# Patient Record
Sex: Male | Born: 1945 | Race: White | Hispanic: No | Smoking: Former smoker
Health system: Southern US, Community
[De-identification: ages and names within clinical notes are randomized; demographics above are authoritative.]

## PROBLEM LIST (undated history)

## (undated) DIAGNOSIS — I10 Essential (primary) hypertension: Secondary | ICD-10-CM

## (undated) DIAGNOSIS — E785 Hyperlipidemia, unspecified: Secondary | ICD-10-CM

## (undated) HISTORY — PX: EYE SURGERY: SHX253

## (undated) HISTORY — DX: Hyperlipidemia, unspecified: E78.5

## (undated) HISTORY — PX: TONSILECTOMY/ADENOIDECTOMY WITH MYRINGOTOMY: SHX6125

## (undated) HISTORY — DX: Essential (primary) hypertension: I10

---

## 2011-06-15 ENCOUNTER — Other Ambulatory Visit (HOSPITAL_COMMUNITY): Payer: Self-pay | Admitting: *Deleted

## 2011-06-15 DIAGNOSIS — E041 Nontoxic single thyroid nodule: Secondary | ICD-10-CM

## 2011-06-16 ENCOUNTER — Other Ambulatory Visit (HOSPITAL_BASED_OUTPATIENT_CLINIC_OR_DEPARTMENT_OTHER): Payer: Self-pay | Admitting: Internal Medicine

## 2011-06-16 ENCOUNTER — Other Ambulatory Visit: Payer: Self-pay | Admitting: Family Medicine

## 2011-06-18 ENCOUNTER — Ambulatory Visit (HOSPITAL_COMMUNITY)
Admission: RE | Admit: 2011-06-18 | Discharge: 2011-06-18 | Disposition: A | Discharge status: Home / Self Care | Payer: BC Managed Care – PPO | Source: Ambulatory Visit | Attending: Family Medicine | Admitting: Family Medicine

## 2011-06-18 DIAGNOSIS — E041 Nontoxic single thyroid nodule: Secondary | ICD-10-CM

## 2011-06-18 DIAGNOSIS — E049 Nontoxic goiter, unspecified: Secondary | ICD-10-CM | POA: Insufficient documentation

## 2012-06-17 ENCOUNTER — Encounter: Payer: Self-pay | Admitting: Nurse Practitioner

## 2012-06-17 ENCOUNTER — Ambulatory Visit (INDEPENDENT_AMBULATORY_CARE_PROVIDER_SITE_OTHER): Payer: Medicare Other | Admitting: Nurse Practitioner

## 2012-06-17 VITALS — BP 121/76 | HR 75 | Temp 99.1°F | Ht 67.0 in | Wt 186.0 lb

## 2012-06-17 DIAGNOSIS — E785 Hyperlipidemia, unspecified: Secondary | ICD-10-CM

## 2012-06-17 DIAGNOSIS — I10 Essential (primary) hypertension: Secondary | ICD-10-CM | POA: Insufficient documentation

## 2012-06-17 DIAGNOSIS — E782 Mixed hyperlipidemia: Secondary | ICD-10-CM | POA: Insufficient documentation

## 2012-06-17 DIAGNOSIS — Z139 Encounter for screening, unspecified: Secondary | ICD-10-CM

## 2012-06-17 MED ORDER — LISINOPRIL 10 MG PO TABS: 10.0000 mg | ORAL_TABLET | Freq: Every day | ORAL | Status: DC

## 2012-06-17 NOTE — Progress Notes (Signed)
  Subjective:    Patient ID: Grant Berry, male    DOB: [birth year], 67 y.o.   MRN: [id number]  Hypertension This is a chronic problem. The current episode started more than 1 year ago. The problem is unchanged. The problem is controlled. Pertinent negatives include no chest pain, headaches, malaise/fatigue, neck pain, orthopnea, palpitations, peripheral edema, shortness of breath or sweats. Risk factors for coronary artery disease include dyslipidemia, obesity and male gender. Past treatments include ACE inhibitors. The current treatment provides moderate improvement. There are no compliance problems.   Hyperlipidemia This is a chronic problem. The current episode started more than 1 year ago. The problem is controlled. Recent lipid tests were reviewed and are normal. There are no known factors aggravating his hyperlipidemia. Pertinent negatives include no chest pain, focal sensory loss, focal weakness, leg pain, myalgias or shortness of breath. Current antihyperlipidemic treatment includes diet change and exercise (flax seed oil OTC 3 X/day). The current treatment provides significant improvement of lipids. There are no compliance problems.  Risk factors for coronary artery disease include hypertension and male sex.      Review of Systems  Constitutional: Negative.  Negative for malaise/fatigue.  HENT: Negative.  Negative for neck pain.   Eyes: Negative.   Respiratory: Negative for shortness of breath.   Cardiovascular: Negative for chest pain, palpitations and orthopnea.  Endocrine: Negative.   Genitourinary: Negative.   Musculoskeletal: Negative.  Negative for myalgias.  Neurological: Negative for focal weakness and headaches.  Hematological: Negative.   Psychiatric/Behavioral: Negative.        Objective:   Physical Exam  Constitutional: He is oriented to person, place, and time. He appears well-developed and well-nourished.  HENT:  Head: Normocephalic.  Right Ear: External ear  normal.  Left Ear: External ear normal.  Nose: Nose normal.  Mouth/Throat: Oropharynx is clear and moist.  Eyes: EOM are normal. Pupils are equal, round, and reactive to light.  Neck: Normal range of motion. Neck supple. No thyromegaly present.  Cardiovascular: Normal rate, regular rhythm, normal heart sounds and intact distal pulses.   No murmur heard. Pulmonary/Chest: Effort normal and breath sounds normal. He has no wheezes. He has no rales.  Abdominal: Soft. Bowel sounds are normal.  Genitourinary:  Refuses rectal exam  Musculoskeletal: Normal range of motion.  Neurological: He is alert and oriented to person, place, and time.  Skin: Skin is warm and dry.  Psychiatric: He has a normal mood and affect. His behavior is normal. Judgment and thought content normal.   BP 121/76  Pulse 75  Temp(Src) 99.1 F (37.3 C) (Oral)  Ht 5\' 7"  (1.702 m)  Wt 186 lb (84.369 kg)  BMI 29.12 kg/m2     Assessment & Plan:  1. Essential hypertension, benign Low Na+ diet - COMPLETE METABOLIC PANEL WITH GFR - lisinopril (PRINIVIL,ZESTRIL) 10 MG tablet; Take 1 tablet (10 mg total) by mouth daily.  Dispense: 90 tablet; Refill: 1  2. Hyperlipidemia with target LDL less than 100 Low fat diet and exercise - NMR Lipoprofile with Lipids  3. Screening - PSA Mary-Margaret Daphine Deutscher, FNP

## 2012-06-17 NOTE — Patient Instructions (Signed)

## 2012-06-18 LAB — COMPLETE METABOLIC PANEL WITH GFR
AST: 22 U/L (ref 0–37)
Albumin: 4 g/dL (ref 3.5–5.2)
Alkaline Phosphatase: 47 U/L (ref 39–117)
Potassium: 4.7 mEq/L (ref 3.5–5.3)
Sodium: 137 mEq/L (ref 135–145)
Total Protein: 7 g/dL (ref 6.0–8.3)

## 2012-06-18 LAB — PSA: PSA: 3.23 ng/mL (ref <=4.00)

## 2012-06-20 ENCOUNTER — Other Ambulatory Visit: Payer: Self-pay | Admitting: Nurse Practitioner

## 2012-06-20 LAB — NMR LIPOPROFILE WITH LIPIDS
Cholesterol, Total: 219 mg/dL — ABNORMAL HIGH (ref <200)
HDL-C: 34 mg/dL — ABNORMAL LOW (ref >=40)
LDL Particle Number: 2224 nmol/L — ABNORMAL HIGH (ref <1000)
Large HDL-P: 1.8 umol/L — ABNORMAL LOW (ref >=4.8)
Large VLDL-P: 3.3 nmol/L — ABNORMAL HIGH (ref <=2.7)
Triglycerides: 215 mg/dL — ABNORMAL HIGH (ref <150)
VLDL Size: 44.9 nm (ref <=46.6)

## 2012-06-20 MED ORDER — ATORVASTATIN CALCIUM 40 MG PO TABS: 40.0000 mg | ORAL_TABLET | Freq: Every day | ORAL | Status: DC

## 2012-06-24 ENCOUNTER — Other Ambulatory Visit: Payer: Self-pay | Admitting: Nurse Practitioner

## 2012-06-24 DIAGNOSIS — I10 Essential (primary) hypertension: Secondary | ICD-10-CM

## 2012-06-24 MED ORDER — ATORVASTATIN CALCIUM 40 MG PO TABS: 40.0000 mg | ORAL_TABLET | Freq: Every day | ORAL | Status: DC

## 2012-06-24 MED ORDER — LISINOPRIL 10 MG PO TABS: 10.0000 mg | ORAL_TABLET | Freq: Every day | ORAL | Status: DC

## 2012-12-26 ENCOUNTER — Encounter: Payer: Self-pay | Admitting: Nurse Practitioner

## 2012-12-26 ENCOUNTER — Ambulatory Visit (INDEPENDENT_AMBULATORY_CARE_PROVIDER_SITE_OTHER): Payer: Medicare Other | Admitting: Nurse Practitioner

## 2012-12-26 VITALS — BP 154/79 | HR 77 | Temp 99.5°F | Ht 67.0 in | Wt 179.0 lb

## 2012-12-26 DIAGNOSIS — Z125 Encounter for screening for malignant neoplasm of prostate: Secondary | ICD-10-CM

## 2012-12-26 DIAGNOSIS — E785 Hyperlipidemia, unspecified: Secondary | ICD-10-CM

## 2012-12-26 DIAGNOSIS — Z23 Encounter for immunization: Secondary | ICD-10-CM

## 2012-12-26 DIAGNOSIS — I10 Essential (primary) hypertension: Secondary | ICD-10-CM

## 2012-12-26 MED ORDER — ATORVASTATIN CALCIUM 40 MG PO TABS: 40.0000 mg | ORAL_TABLET | Freq: Every day | ORAL | Status: DC

## 2012-12-26 MED ORDER — LISINOPRIL 10 MG PO TABS: 10.0000 mg | ORAL_TABLET | Freq: Every day | ORAL | Status: DC

## 2012-12-26 NOTE — Progress Notes (Signed)
Subjective:    Patient ID: Grant Berry, male    DOB: 04-22-1945, 67 y.o.   MRN: [id number]  Hypertension This is a chronic problem. The current episode started more than 1 year ago. The problem is unchanged. The problem is controlled. Pertinent negatives include no chest pain, headaches, malaise/fatigue, neck pain, orthopnea, palpitations, peripheral edema, shortness of breath or sweats. Risk factors for coronary artery disease include dyslipidemia, obesity and male gender. Past treatments include ACE inhibitors. The current treatment provides moderate improvement. There are no compliance problems.   Hyperlipidemia This is a chronic problem. The current episode started more than 1 year ago. The problem is controlled. Recent lipid tests were reviewed and are normal. There are no known factors aggravating his hyperlipidemia. Pertinent negatives include no chest pain, focal sensory loss, focal weakness, leg pain, myalgias or shortness of breath. Current antihyperlipidemic treatment includes diet change and exercise (flax seed oil OTC 3 X/day). The current treatment provides significant improvement of lipids. There are no compliance problems.  Risk factors for coronary artery disease include hypertension and male sex.      Review of Systems  Constitutional: Negative.  Negative for malaise/fatigue.  HENT: Negative.   Eyes: Negative.   Respiratory: Negative for shortness of breath.   Cardiovascular: Negative for chest pain, palpitations and orthopnea.  Endocrine: Negative.   Genitourinary: Negative.   Musculoskeletal: Negative.  Negative for myalgias and neck pain.  Neurological: Negative for focal weakness and headaches.  Hematological: Negative.   Psychiatric/Behavioral: Negative.        Objective:   Physical Exam  Constitutional: He is oriented to person, place, and time. He appears well-developed and well-nourished.  HENT:  Head: Normocephalic.  Right Ear: External ear normal.   Left Ear: External ear normal.  Nose: Nose normal.  Mouth/Throat: Oropharynx is clear and moist.  Eyes: EOM are normal. Pupils are equal, round, and reactive to light.  Neck: Normal range of motion. Neck supple. No thyromegaly present.  Cardiovascular: Normal rate, regular rhythm, normal heart sounds and intact distal pulses.   No murmur heard. Pulmonary/Chest: Effort normal and breath sounds normal. He has no wheezes. He has no rales.  Abdominal: Soft. Bowel sounds are normal.  Genitourinary:  Refuses rectal exam  Musculoskeletal: Normal range of motion.  Neurological: He is alert and oriented to person, place, and time.  Skin: Skin is warm and dry.  Psychiatric: He has a normal mood and affect. His behavior is normal. Judgment and thought content normal.   BP 154/79  Pulse 77  Temp(Src) 99.5 F (37.5 C) (Oral)  Ht 5\' 7"  (1.702 m)  Wt 179 lb (81.194 kg)  BMI 28.03 kg/m2     Assessment & Plan:   1. Essential hypertension, benign   2. Hyperlipidemia with target LDL less than 100   3. Prostate cancer screening    Orders Placed This Encounter  Procedures  . CMP14+EGFR  . NMR, lipoprofile  . PSA, total and free   Meds ordered this encounter  Medications  . Cholecalciferol (VITAMIN D-3) 1000 UNITS CAPS    Sig: Take by mouth.  Marland Kitchen atorvastatin (LIPITOR) 40 MG tablet    Sig: Take 1 tablet (40 mg total) by mouth daily.    Dispense:  90 tablet    Refill:  1    Order Specific Question:  Supervising Provider    Answer:  Ernestina Penna [[id number]]  . lisinopril (PRINIVIL,ZESTRIL) 10 MG tablet    Sig: Take 1 tablet (10 mg total)  by mouth daily.    Dispense:  90 tablet    Refill:  1    Order Specific Question:  Supervising Provider    Answer:  Deborra Medina    Continue all meds Labs pending Diet and exercise encouraged Health maintenance reviewed Follow up in 3 month  Mary-Margaret Daphine Deutscher, FNP

## 2012-12-26 NOTE — Patient Instructions (Signed)

## 2013-02-20 ENCOUNTER — Telehealth: Payer: Self-pay | Admitting: Nurse Practitioner

## 2013-02-20 NOTE — Telephone Encounter (Signed)
Patient advised to go to urgent care do to no available appts

## 2013-12-25 ENCOUNTER — Other Ambulatory Visit: Payer: Self-pay | Admitting: Nurse Practitioner

## 2014-03-11 DIAGNOSIS — J069 Acute upper respiratory infection, unspecified: Secondary | ICD-10-CM | POA: Diagnosis not present

## 2014-07-14 DIAGNOSIS — H40033 Anatomical narrow angle, bilateral: Secondary | ICD-10-CM | POA: Diagnosis not present

## 2014-07-14 DIAGNOSIS — H2513 Age-related nuclear cataract, bilateral: Secondary | ICD-10-CM | POA: Diagnosis not present

## 2014-09-03 DIAGNOSIS — H2511 Age-related nuclear cataract, right eye: Secondary | ICD-10-CM | POA: Diagnosis not present

## 2014-10-01 DIAGNOSIS — H2513 Age-related nuclear cataract, bilateral: Secondary | ICD-10-CM | POA: Diagnosis not present

## 2014-10-01 DIAGNOSIS — Z961 Presence of intraocular lens: Secondary | ICD-10-CM | POA: Diagnosis not present

## 2014-10-01 DIAGNOSIS — H2511 Age-related nuclear cataract, right eye: Secondary | ICD-10-CM | POA: Diagnosis not present

## 2014-11-05 DIAGNOSIS — H52202 Unspecified astigmatism, left eye: Secondary | ICD-10-CM | POA: Diagnosis not present

## 2014-11-05 DIAGNOSIS — Z961 Presence of intraocular lens: Secondary | ICD-10-CM | POA: Diagnosis not present

## 2014-11-05 DIAGNOSIS — H25812 Combined forms of age-related cataract, left eye: Secondary | ICD-10-CM | POA: Diagnosis not present

## 2014-11-05 DIAGNOSIS — H2512 Age-related nuclear cataract, left eye: Secondary | ICD-10-CM | POA: Diagnosis not present

## 2015-01-29 ENCOUNTER — Telehealth: Payer: Self-pay | Admitting: Nurse Practitioner

## 2019-07-25 DIAGNOSIS — H40033 Anatomical narrow angle, bilateral: Secondary | ICD-10-CM | POA: Diagnosis not present

## 2019-07-25 DIAGNOSIS — H10013 Acute follicular conjunctivitis, bilateral: Secondary | ICD-10-CM | POA: Diagnosis not present

## 2019-09-01 ENCOUNTER — Emergency Department (HOSPITAL_COMMUNITY): Payer: Medicare Other

## 2019-09-01 ENCOUNTER — Inpatient Hospital Stay (HOSPITAL_COMMUNITY)
Admission: EM | Admit: 2019-09-01 | Discharge: 2019-09-03 | DRG: 066 | Disposition: A | Discharge status: Home / Self Care | Payer: Medicare Other | Attending: Internal Medicine | Admitting: Internal Medicine

## 2019-09-01 ENCOUNTER — Encounter (HOSPITAL_COMMUNITY): Payer: Self-pay

## 2019-09-01 DIAGNOSIS — Z8249 Family history of ischemic heart disease and other diseases of the circulatory system: Secondary | ICD-10-CM | POA: Diagnosis not present

## 2019-09-01 DIAGNOSIS — R9431 Abnormal electrocardiogram [ECG] [EKG]: Secondary | ICD-10-CM | POA: Diagnosis not present

## 2019-09-01 DIAGNOSIS — Z823 Family history of stroke: Secondary | ICD-10-CM

## 2019-09-01 DIAGNOSIS — R000 Tachycardia, unspecified: Secondary | ICD-10-CM | POA: Diagnosis not present

## 2019-09-01 DIAGNOSIS — Z20822 Contact with and (suspected) exposure to covid-19: Secondary | ICD-10-CM | POA: Diagnosis present

## 2019-09-01 DIAGNOSIS — R29700 NIHSS score 0: Secondary | ICD-10-CM | POA: Diagnosis present

## 2019-09-01 DIAGNOSIS — G8324 Monoplegia of upper limb affecting left nondominant side: Secondary | ICD-10-CM | POA: Diagnosis present

## 2019-09-01 DIAGNOSIS — R404 Transient alteration of awareness: Secondary | ICD-10-CM | POA: Diagnosis not present

## 2019-09-01 DIAGNOSIS — E782 Mixed hyperlipidemia: Secondary | ICD-10-CM | POA: Diagnosis present

## 2019-09-01 DIAGNOSIS — I361 Nonrheumatic tricuspid (valve) insufficiency: Secondary | ICD-10-CM | POA: Diagnosis not present

## 2019-09-01 DIAGNOSIS — I7090 Unspecified atherosclerosis: Secondary | ICD-10-CM | POA: Diagnosis not present

## 2019-09-01 DIAGNOSIS — Z833 Family history of diabetes mellitus: Secondary | ICD-10-CM | POA: Diagnosis not present

## 2019-09-01 DIAGNOSIS — K0889 Other specified disorders of teeth and supporting structures: Secondary | ICD-10-CM | POA: Diagnosis not present

## 2019-09-01 DIAGNOSIS — Z87891 Personal history of nicotine dependence: Secondary | ICD-10-CM | POA: Diagnosis not present

## 2019-09-01 DIAGNOSIS — I10 Essential (primary) hypertension: Secondary | ICD-10-CM | POA: Diagnosis not present

## 2019-09-01 DIAGNOSIS — E785 Hyperlipidemia, unspecified: Secondary | ICD-10-CM | POA: Diagnosis not present

## 2019-09-01 DIAGNOSIS — I6523 Occlusion and stenosis of bilateral carotid arteries: Secondary | ICD-10-CM | POA: Diagnosis not present

## 2019-09-01 DIAGNOSIS — R42 Dizziness and giddiness: Secondary | ICD-10-CM | POA: Diagnosis not present

## 2019-09-01 DIAGNOSIS — G319 Degenerative disease of nervous system, unspecified: Secondary | ICD-10-CM | POA: Diagnosis not present

## 2019-09-01 DIAGNOSIS — R531 Weakness: Secondary | ICD-10-CM | POA: Diagnosis not present

## 2019-09-01 DIAGNOSIS — I639 Cerebral infarction, unspecified: Principal | ICD-10-CM | POA: Diagnosis present

## 2019-09-01 DIAGNOSIS — R7989 Other specified abnormal findings of blood chemistry: Secondary | ICD-10-CM | POA: Diagnosis present

## 2019-09-01 DIAGNOSIS — Z743 Need for continuous supervision: Secondary | ICD-10-CM | POA: Diagnosis not present

## 2019-09-01 DIAGNOSIS — R0689 Other abnormalities of breathing: Secondary | ICD-10-CM | POA: Diagnosis not present

## 2019-09-01 DIAGNOSIS — I499 Cardiac arrhythmia, unspecified: Secondary | ICD-10-CM | POA: Diagnosis not present

## 2019-09-01 DIAGNOSIS — I6782 Cerebral ischemia: Secondary | ICD-10-CM | POA: Diagnosis not present

## 2019-09-01 LAB — TROPONIN I (HIGH SENSITIVITY)
Troponin I (High Sensitivity): 6 ng/L (ref <18)
Troponin I (High Sensitivity): 6 ng/L (ref <18)

## 2019-09-01 LAB — BASIC METABOLIC PANEL
Anion gap: 9 (ref 5–15)
BUN: 18 mg/dL (ref 8–23)
CO2: 26 mmol/L (ref 22–32)
Calcium: 9.4 mg/dL (ref 8.9–10.3)
Chloride: 103 mmol/L (ref 98–111)
Creatinine, Ser: 1.31 mg/dL — ABNORMAL HIGH (ref 0.61–1.24)
GFR calc Af Amer: >60 mL/min (ref >60)
GFR calc non Af Amer: 54 mL/min — ABNORMAL LOW (ref >60)
Glucose, Bld: 97 mg/dL (ref 70–99)
Potassium: 4 mmol/L (ref 3.5–5.1)
Sodium: 138 mmol/L (ref 135–145)

## 2019-09-01 LAB — CBC
HCT: 49.7 % (ref 39.0–52.0)
Hemoglobin: 16.4 g/dL (ref 13.0–17.0)
MCH: 30 pg (ref 26.0–34.0)
MCHC: 33 g/dL (ref 30.0–36.0)
MCV: 91 fL (ref 80.0–100.0)
Platelets: 206 10*3/uL (ref 150–400)
RBC: 5.46 MIL/uL (ref 4.22–5.81)
RDW: 13.6 % (ref 11.5–15.5)
WBC: 7.3 10*3/uL (ref 4.0–10.5)
nRBC: 0 % (ref 0.0–0.2)

## 2019-09-01 LAB — MAGNESIUM: Magnesium: 2.2 mg/dL (ref 1.7–2.4)

## 2019-09-01 LAB — SARS CORONAVIRUS 2 BY RT PCR (HOSPITAL ORDER, PERFORMED IN ~~LOC~~ HOSPITAL LAB): SARS Coronavirus 2: NEGATIVE

## 2019-09-01 MED ORDER — MECLIZINE HCL 12.5 MG PO TABS
50.0000 mg | ORAL_TABLET | Freq: Once | ORAL | Status: AC
  Administered 2019-09-01: 50 mg via ORAL
  Filled 2019-09-01: qty 4

## 2019-09-01 MED ORDER — SODIUM CHLORIDE 0.9 % IV SOLN: INTRAVENOUS | Status: DC

## 2019-09-01 MED ORDER — ACETAMINOPHEN 650 MG RE SUPP: 650.0000 mg | Freq: Four times a day (QID) | RECTAL | Status: DC | PRN

## 2019-09-01 MED ORDER — ASPIRIN 300 MG RE SUPP: 300.0000 mg | Freq: Every day | RECTAL | Status: DC

## 2019-09-01 MED ORDER — ACETAMINOPHEN 325 MG PO TABS: 650.0000 mg | ORAL_TABLET | Freq: Four times a day (QID) | ORAL | Status: DC | PRN

## 2019-09-01 MED ORDER — ONDANSETRON HCL 4 MG/2ML IJ SOLN: 4.0000 mg | Freq: Four times a day (QID) | INTRAMUSCULAR | Status: DC | PRN

## 2019-09-01 MED ORDER — ASPIRIN 325 MG PO TABS
325.0000 mg | ORAL_TABLET | Freq: Every day | ORAL | Status: DC
  Administered 2019-09-01 – 2019-09-03 (×3): 325 mg via ORAL
  Filled 2019-09-01 (×3): qty 1

## 2019-09-01 MED ORDER — STROKE: EARLY STAGES OF RECOVERY BOOK
Freq: Once | Status: AC
  Administered 2019-09-02: 1
  Filled 2019-09-01: qty 1

## 2019-09-01 MED ORDER — ONDANSETRON HCL 4 MG PO TABS: 4.0000 mg | ORAL_TABLET | Freq: Four times a day (QID) | ORAL | Status: DC | PRN

## 2019-09-01 NOTE — ED Triage Notes (Signed)
Yesterday around 2 pm, pt began experiencing weakness on the left side and dizziness. History of HTN. Sinus arrhythmia with EMS. When patient stood up today, he was very weak.

## 2019-09-01 NOTE — ED Provider Notes (Signed)
[facility]ERGENCY DEPT [facility] Emergency Department Provider Note MRN:  [id number]  Arrival date & time: 09/02/19     Chief Complaint   Dizziness   History of Present Illness   Grant Berry is a 74 y.o. year-old male with a history of hypertension, hyperlipidemia presenting to the ED with chief complaint of dizziness.  Patient explains that for the past 1 or 2 days he is experiencing a room spinning sensation when he looks to the left and when he looks down.  This is never happened to him before.  EMS told him that his left side seemed weak, and now he feels that he has been experiencing some left-sided weakness since yesterday.  The weakness he describes as a tremor, mostly in the left arm that seems to be occurring when he is trying to tie his shoes.  Denies headache, no vision change, no numbness, no chest pain or shortness of breath, no abdominal pain.  Review of Systems  A complete 10 system review of systems was obtained and all systems are negative except as noted in the HPI and PMH.   Patient's Health History    Past Medical History:  Diagnosis Date  . Hyperlipidemia   . Hypertension     Past Surgical History:  Procedure Laterality Date  . TONSILECTOMY/ADENOIDECTOMY WITH MYRINGOTOMY      Family History  Problem Relation Age of Onset  . Stroke Mother   . Coronary artery disease Mother   . Coronary artery disease Father   . Diabetes Maternal Grandfather     Social History   Socioeconomic History  . Marital status: Married    Spouse name: Not on file  . Number of children: Not on file  . Years of education: Not on file  . Highest education level: Not on file  Occupational History  . Not on file  Tobacco Use  . Smoking status: Former Smoker    Quit date: 05/24/1988    Years since quitting: 31.2  . Smokeless tobacco: Never Used  Substance and Sexual Activity  . Alcohol use: No  . Drug use: No  . Sexual activity: Not on file  Other Topics Concern  .  Not on file  Social History Narrative  . Not on file   Social Determinants of Health   Financial Resource Strain:   . Difficulty of Paying Living Expenses:   Food Insecurity:   . Worried About Programme researcher, broadcasting/film/video in the Last Year:   . Barista in the Last Year:   Transportation Needs:   . Freight forwarder (Medical):   Marland Kitchen Lack of Transportation (Non-Medical):   Physical Activity:   . Days of Exercise per Week:   . Minutes of Exercise per Session:   Stress:   . Feeling of Stress :   Social Connections:   . Frequency of Communication with Friends and Family:   . Frequency of Social Gatherings with Friends and Family:   . Attends Religious Services:   . Active Member of Clubs or Organizations:   . Attends Banker Meetings:   Marland Kitchen Marital Status:   Intimate Partner Violence:   . Fear of Current or Ex-Partner:   . Emotionally Abused:   Marland Kitchen Physically Abused:   . Sexually Abused:      Physical Exam   Vitals:   09/01/19 1603  BP: (!) 166/93  Pulse: 72  Resp: 15  Temp: 98.4 F (36.9 C)  SpO2: 100%  CONSTITUTIONAL: Well-appearing, NAD NEURO:  Alert and oriented x 3, normal and symmetric strength and sensation, normal coordination, normal speech, no neglect, no aphasia, no visual field cuts EYES:  eyes equal and reactive, normal extraocular movements, no nystagmus ENT/NECK:  no LAD, no JVD CARDIO: Regular rate, well-perfused, normal S1 and S2 PULM:  CTAB no wheezing or rhonchi GI/GU:  normal bowel sounds, non-distended, non-tender MSK/SPINE:  No gross deformities, no edema SKIN:  no rash, atraumatic PSYCH:  Appropriate speech and behavior  *Additional and/or pertinent findings included in MDM below  Diagnostic and Interventional Summary    EKG Interpretation  Date/Time:  Friday September 01 2019 15:56:22 EDT Ventricular Rate:  91 PR Interval:  190 QRS Duration: 92 QT Interval:  362 QTC Calculation: 445 R Axis:   73 Text Interpretation: Sinus  rhythm with Premature atrial complexes Nonspecific ST abnormality Abnormal ECG Confirmed by Kennis Carina [phone]) on 09/01/2019 6:34:40 PM      Labs Reviewed  BASIC METABOLIC PANEL - Abnormal; Notable for the following components:      Result Value   Creatinine, Ser 1.31 (*)    GFR calc non Af Amer 54 (*)    All other components within normal limits  SARS CORONAVIRUS 2 BY RT PCR (HOSPITAL ORDER, PERFORMED IN [facility])  CBC  MAGNESIUM  HEMOGLOBIN A1C  LIPID PANEL  BASIC METABOLIC PANEL  TROPONIN I (HIGH SENSITIVITY)  TROPONIN I (HIGH SENSITIVITY)    MR BRAIN WO CONTRAST  Final Result    MR ANGIO HEAD WO CONTRAST  Final Result    CT HEAD WO CONTRAST  Final Result      Medications   stroke: mapping our early stages of recovery book (has no administration in time range)  0.9 %  sodium chloride infusion ( Intravenous New Bag/Given 09/01/19 2249)  acetaminophen (TYLENOL) tablet 650 mg (has no administration in time range)    Or  acetaminophen (TYLENOL) suppository 650 mg (has no administration in time range)  ondansetron (ZOFRAN) tablet 4 mg (has no administration in time range)    Or  ondansetron (ZOFRAN) injection 4 mg (has no administration in time range)  aspirin suppository 300 mg ( Rectal See Alternative 09/01/19 2248)    Or  aspirin tablet 325 mg (325 mg Oral Given 09/01/19 2248)  meclizine (ANTIVERT) tablet 50 mg (50 mg Oral Given 09/01/19 1726)     Procedures  /  Critical Care .Critical Care Performed by: Sabas Sous, MD Authorized by: Sabas Sous, MD   Critical care provider statement:    Critical care time (minutes):  45   Critical care was necessary to treat or prevent imminent or life-threatening deterioration of the following conditions: acute ischemic stroke.   Critical care was time spent personally by me on the following activities:  Discussions with consultants, evaluation of patient's response to treatment, examination of patient,  ordering and performing treatments and interventions, ordering and review of laboratory studies, ordering and review of radiographic studies, pulse oximetry, re-evaluation of patient's condition, obtaining history from patient or surrogate and review of old charts    ED Course and Medical Decision Making  I have reviewed the triage vital signs, the nursing notes, and pertinent available records from the EMR.  Listed above are laboratory and imaging tests that I personally ordered, reviewed, and interpreted and then considered in my medical decision making (see below for details).      History is initially highly consistent with benign positional vertigo.  However patient is now endorsing this left-sided weakness but he describes it poorly, he has a completely normal neurological exam at this time.  Awaiting labs and CT and will reexamine to determine need for further stroke work-up.  Patient is not a candidate for code stroke initiation given the time of onset greater than 7 hours ago and current NIH stroke scale of 0.  CT head revealing possible cerebellar infarct, confirmed on MRI.  Upon further questioning, patient's description of a left arm tremor yesterday might have actually been loss of coordination, which can be related to the ipsilateral or supplemental.  Admitted to hospital service for further care.  Elmer Sow. Pilar Plate, MD [facility] Emergency Medicine [facility] [email or web address]  Final Clinical Impressions(s) / ED Diagnoses     ICD-10-CM   1. Acute ischemic stroke [facility])  I63.9     ED Discharge Orders    None       Discharge Instructions Discussed with and Provided to Patient:   Discharge Instructions   None       Sabas Sous, MD 09/02/19 [phone]

## 2019-09-01 NOTE — H&P (Addendum)
History and Physical    Grant Berry ZOX:[id number] DOB: 03/03/1945 DOA: 09/01/2019  PCP: Patient, No Pcp Per  Patient coming from: Home.  I have personally briefly reviewed patient's old medical records in [facility]  Chief Complaint: Dizziness and weakness.  HPI: Grant Berry is a 74 y.o. male with medical history significant of hypertension, hyperlipidemia who is coming to the emergency department due to having a spinning sensation since yesterday particularly when he looks to the left or down.  He denies nausea, emesis, vision changes, headaches, but state he has also been experiencing left sided extremities weakness since yesterday.  He denies fever, chills, rhinorrhea, sore throat, dyspnea, wheezing or hemoptysis.  No chest pain, palpitations, diaphoresis, PND, orthopnea or pitting edema of the lower extremities.  Denies abdominal pain, diarrhea, constipation, melena or hematochezia.  No dysuria, frequency or hematuria.  ED Course: Initial vital signs were temperature 98.4 F, pulse 72, RR 15 bpm, BP 166/93 mmHg and O2 sat 100% on room air.  25 mg of meclizine were given in the ED.  CBC was normal.  BMP showed a creatinine of 1.31, increased from 1.22 mg/dL, otherwise the BMP is normal.  Troponin x2 6 ng/L.  ECG showed nonspecific ST abnormality.  Imaging: CT head showed a 2 cm focus of abnormal hypodensity within the left cerebellar hemisphere.  This was confirmed by MRI MRA of brain.  Please see images and full radiology report for further detail.  Review of Systems: As per HPI otherwise all other systems reviewed and are negative.  Past Medical History:  Diagnosis Date  . Hyperlipidemia   . Hypertension    Past Surgical History:  Procedure Laterality Date  . TONSILECTOMY/ADENOIDECTOMY WITH MYRINGOTOMY     Social History  reports that he quit smoking about 31 years ago. He has never used smokeless tobacco. He reports that he does not drink alcohol and does not  use drugs.  No Known Allergies  Family History  Problem Relation Age of Onset  . Stroke Mother   . Coronary artery disease Mother   . Coronary artery disease Father   . Diabetes Maternal Grandfather    Prior to Admission medications   Not on File   Physical Exam: Vitals:   09/01/19 1603  BP: (!) 166/93  Pulse: 72  Resp: 15  Temp: 98.4 F (36.9 C)  TempSrc: Oral  SpO2: 100%  Weight: 81.2 kg  Height: 5\' 7"  (1.702 m)   Constitutional: NAD, calm, comfortable Eyes: PERRL, lids and conjunctivae are injected. ENMT: Mucous membranes are mildly dry.  Posterior pharynx clear of any exudate or lesions. Neck: normal, supple, no masses, no thyromegaly Respiratory: clear to auscultation bilaterally, no wheezing, no crackles. Normal respiratory effort. No accessory muscle use.  Cardiovascular: Regular rate and rhythm, no murmurs / rubs / gallops. No extremity edema. 2+ pedal pulses. No carotid bruits.  Abdomen: Nondistended.  BS positive.  Soft, no tenderness, no masses palpated. No hepatosplenomegaly. Musculoskeletal: no clubbing / cyanosis. Good ROM, no contractures. Normal muscle tone.  Skin: no rashes, lesions, ulcers on very limited dermatological examination. Neurologic: Intention tremor with decreased coordination on LUE.  CN 2-12 grossly intact. Sensation seems intact and equal bilaterally, DTR normal.  Left-sided 4.5/5 hemiparesis Psychiatric: Normal judgment and insight. Alert and oriented x 3. Normal mood.   Labs on Admission: I have personally reviewed following labs and imaging studies  CBC: Recent Labs  Lab 09/01/19 1706  WBC 7.3  HGB 16.4  HCT 49.7  MCV 91.0  PLT 206    Basic Metabolic Panel: Recent Labs  Lab 09/01/19 1706  NA 138  K 4.0  CL 103  CO2 26  GLUCOSE 97  BUN 18  CREATININE 1.31*  CALCIUM 9.4  MG 2.2    GFR: Estimated Creatinine Clearance: 51.2 mL/min (A) (by C-G formula based on SCr of 1.31 mg/dL (H)).  Liver Function Tests: No  results for input(s): AST, ALT, ALKPHOS, BILITOT, PROT, ALBUMIN in the last 168 hours.  Urine analysis: No results found for: COLORURINE, APPEARANCEUR, LABSPEC, PHURINE, GLUCOSEU, HGBUR, BILIRUBINUR, KETONESUR, PROTEINUR, UROBILINOGEN, NITRITE, LEUKOCYTESUR  Radiological Exams on Admission: CT HEAD WO CONTRAST  Result Date: 09/01/2019 CLINICAL DATA:  Vertigo, question left-sided weakness yesterday. EXAM: CT HEAD WITHOUT CONTRAST TECHNIQUE: Contiguous axial images were obtained from the base of the skull through the vertex without intravenous contrast. COMPARISON:  No pertinent prior studies available for comparison. FINDINGS: Brain: Mild generalized parenchymal atrophy. 2.0 x 1.3 x 1.6 cm region of abnormal hypodensity within the left cerebellar hemisphere, favored to reflect an age-indeterminate infarct (series 2, image 10) (series 4, image 51). Mild ill-defined hypoattenuation within the cerebral white matter is nonspecific, but consistent with chronic small vessel ischemic disease. There is no acute intracranial hemorrhage. No demarcated cortical infarct is identified. No extra-axial fluid collection. No evidence of intracranial mass. No midline shift. Vascular: No hyperdense vessel.  Atherosclerotic calcifications. Skull: Normal. Negative for fracture or focal lesion. Sinuses/Orbits: Visualized orbits show no acute finding. No significant paranasal sinus disease or mastoid effusion at the imaged levels. IMPRESSION: 2 cm focus of abnormal hypodensity within the left cerebellar hemisphere. This is favored to reflect an age-indeterminate infarct, possibly acute/early subacute. Given the provided history of dizziness, consider brain MRI to assess for acuity and to exclude alternative etiologies. Background mild generalized parenchymal atrophy and cerebral white matter chronic small vessel ischemic disease. Electronically Signed   By: Jackey Loge DO   On: 09/01/2019 17:19   MR ANGIO HEAD WO  CONTRAST  Result Date: 09/01/2019 CLINICAL DATA:  Left-sided weakness and dizziness. Abnormal cerebellar appearance by CT. EXAM: MRI HEAD WITHOUT CONTRAST MRA HEAD WITHOUT CONTRAST TECHNIQUE: Multiplanar, multiecho pulse sequences of the brain and surrounding structures were obtained without intravenous contrast. Angiographic images of the head were obtained using MRA technique without contrast. COMPARISON:  Head CT same day FINDINGS: MRI HEAD FINDINGS Brain: Acute infarction affecting the superior cerebellum on the left. No mass effect. No visible hemorrhage. No other acute intracranial finding. Brainstem itself appears normal. No old cerebellar insult. Cerebral hemispheres show mild chronic small-vessel change of the white matter. No hydrocephalus. 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 Vascular: Major vessels at the base of the brain show flow. Skull and upper cervical spine: Negative Sinuses/Orbits: Clear/normal Other: None MRA HEAD FINDINGS Both internal carotid arteries are widely patent through the skull base and siphon regions. The anterior and middle cerebral vessels are patent without proximal stenosis, aneurysm or vascular malformation. Distal vessels show atherosclerotic irregularity. Both vertebral arteries are patent to the basilar. No basilar stenosis. Mild atherosclerotic irregularity of the basilar artery. Right PICA is patent. Both anterior inferior cerebellar arteries are patent. No flow seen in a left superior cerebellar artery. 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 Distal PCA branches show atherosclerotic irregularity. IMPRESSION: Acute infarction in the left superior cerebellar artery distribution. No mass effect or hemorrhage. Mild chronic small-vessel  ischemic change of the cerebral hemispheric white matter. No flow seen in the left superior cerebellar artery. Distal vessel atherosclerotic  irregularity diffusely affecting the intracranial vessels. Electronically Signed   By: Paulina Fusi M.D.   On: 09/01/2019 20:45   MR BRAIN WO CONTRAST  Result Date: 09/01/2019 CLINICAL DATA:  Left-sided weakness and dizziness. Abnormal cerebellar appearance by CT. EXAM: MRI HEAD WITHOUT CONTRAST MRA HEAD WITHOUT CONTRAST TECHNIQUE: Multiplanar, multiecho pulse sequences of the brain and surrounding structures were obtained without intravenous contrast. Angiographic images of the head were obtained using MRA technique without contrast. COMPARISON:  Head CT same day FINDINGS: MRI HEAD FINDINGS Brain: Acute infarction affecting the superior cerebellum on the left. No mass effect. No visible hemorrhage. No other acute intracranial finding. Brainstem itself appears normal. No old cerebellar insult. Cerebral hemispheres show mild chronic small-vessel change of the white matter. No hydrocephalus. 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 Vascular: Major vessels at the base of the brain show flow. Skull and upper cervical spine: Negative Sinuses/Orbits: Clear/normal Other: None MRA HEAD FINDINGS Both internal carotid arteries are widely patent through the skull base and siphon regions. The anterior and middle cerebral vessels are patent without proximal stenosis, aneurysm or vascular malformation. Distal vessels show atherosclerotic irregularity. Both vertebral arteries are patent to the basilar. No basilar stenosis. Mild atherosclerotic irregularity of the basilar artery. Right PICA is patent. Both anterior inferior cerebellar arteries are patent. No flow seen in a left superior cerebellar artery. 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 Distal PCA branches show atherosclerotic irregularity. IMPRESSION: Acute infarction in the left superior cerebellar artery distribution. No mass effect or hemorrhage. Mild chronic small-vessel  ischemic change of the cerebral hemispheric white matter. No flow seen in the left superior cerebellar artery. Distal vessel atherosclerotic irregularity diffusely affecting the intracranial vessels. Electronically Signed   By: Paulina Fusi M.D.   On: 09/01/2019 20:45   EKG: Independently reviewed.  Vent. 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 Frequent neuro checks. PT/ST/SLP. Check fasting lipids. Check hemoglobin A1c. Check echocardiogram. Daily aspirin. Neuro hospitalist team to evaluate.  Active Problems:   Abnormal ECG Troponin x2 normal. Check echocardiogram.    Elevated serum creatinine Decreased oral intake since yesterday. Continue IVF and follow creatinine level.    Essential hypertension, benign Not on antihypertensive therapy at home. Allow permissive hypertension up to 220/120 mmHg for now.    Hyperlipidemia with target LDL less than 100 Not on medical therapy. Check fasting lipids in the morning.    Toothache Start Augmentin. Follow up with dental as an outpatient.    DVT prophylaxis: SCDs. Code Status:   Full code. Family Communication:   Disposition Plan:   Patient is from:  Home.  Anticipated DC to:  Home.  Anticipated DC date:  09/03/2019.  Anticipated DC barriers: Clinical improvement/pending CVA work-up. Consults called:  Neuro hospitalist at [facility]. Admission status:  Inpatient/telemetry.  Severity of Illness:  High.  Bobette Mo MD [facility]  How to contact the [facility] Attending or Consulting provider 7A - 7P or covering provider during after hours 7P -7A, for this patient?   1. Check the care  team in [facility] and look for a) attending/consulting [facility]  provider listed and b) the [facility] team listed 2. Log into [email or web address] and use [facility] universal password to access. If you do not have the password, please contact the hospital operator. 3. Locate the [facility] provider you are looking for under [facility] and page to a number that you can be directly reached. 4. If you still have difficulty reaching the provider, please page the [facility]) for the Hospitalists listed on amion for assistance.  09/01/2019, 10:16 PM   This document was prepared using Dragon voice recognition software and may contain some unintended transcription errors.

## 2019-09-02 ENCOUNTER — Other Ambulatory Visit (HOSPITAL_COMMUNITY): Payer: Medicare Other

## 2019-09-02 ENCOUNTER — Inpatient Hospital Stay (HOSPITAL_COMMUNITY): Payer: Medicare Other

## 2019-09-02 ENCOUNTER — Other Ambulatory Visit: Payer: Self-pay

## 2019-09-02 DIAGNOSIS — E785 Hyperlipidemia, unspecified: Secondary | ICD-10-CM

## 2019-09-02 DIAGNOSIS — I10 Essential (primary) hypertension: Secondary | ICD-10-CM

## 2019-09-02 DIAGNOSIS — I639 Cerebral infarction, unspecified: Secondary | ICD-10-CM | POA: Diagnosis present

## 2019-09-02 DIAGNOSIS — I361 Nonrheumatic tricuspid (valve) insufficiency: Secondary | ICD-10-CM

## 2019-09-02 HISTORY — DX: Cerebral infarction, unspecified: I63.9

## 2019-09-02 LAB — BASIC METABOLIC PANEL
Anion gap: 10 (ref 5–15)
BUN: 17 mg/dL (ref 8–23)
CO2: 25 mmol/L (ref 22–32)
Calcium: 9 mg/dL (ref 8.9–10.3)
Chloride: 103 mmol/L (ref 98–111)
Creatinine, Ser: 1.25 mg/dL — ABNORMAL HIGH (ref 0.61–1.24)
GFR calc Af Amer: >60 mL/min (ref >60)
GFR calc non Af Amer: 57 mL/min — ABNORMAL LOW (ref >60)
Glucose, Bld: 92 mg/dL (ref 70–99)
Potassium: 4 mmol/L (ref 3.5–5.1)
Sodium: 138 mmol/L (ref 135–145)

## 2019-09-02 LAB — LIPID PANEL
Cholesterol: 277 mg/dL — ABNORMAL HIGH (ref 0–200)
HDL: 29 mg/dL — ABNORMAL LOW (ref >40)
LDL Cholesterol: 207 mg/dL — ABNORMAL HIGH (ref 0–99)
Total CHOL/HDL Ratio: 9.6 RATIO
Triglycerides: 205 mg/dL — ABNORMAL HIGH (ref <150)
VLDL: 41 mg/dL — ABNORMAL HIGH (ref 0–40)

## 2019-09-02 LAB — HEMOGLOBIN A1C
Hgb A1c MFr Bld: 5.6 % (ref 4.8–5.6)
Mean Plasma Glucose: 114.02 mg/dL

## 2019-09-02 MED ORDER — ASPIRIN 325 MG PO TABS: 325.0000 mg | ORAL_TABLET | Freq: Every day | ORAL | Status: DC

## 2019-09-02 MED ORDER — ATORVASTATIN CALCIUM 40 MG PO TABS
80.0000 mg | ORAL_TABLET | Freq: Every day | ORAL | Status: DC
  Administered 2019-09-02: 80 mg via ORAL
  Filled 2019-09-02: qty 2

## 2019-09-02 MED ORDER — CLOPIDOGREL BISULFATE 75 MG PO TABS
75.0000 mg | ORAL_TABLET | Freq: Every day | ORAL | Status: DC
  Administered 2019-09-02 – 2019-09-03 (×2): 75 mg via ORAL
  Filled 2019-09-02 (×2): qty 1

## 2019-09-02 MED ORDER — ENOXAPARIN SODIUM 40 MG/0.4ML ~~LOC~~ SOLN
40.0000 mg | SUBCUTANEOUS | Status: DC
  Administered 2019-09-02: 40 mg via SUBCUTANEOUS
  Filled 2019-09-02 (×2): qty 0.4

## 2019-09-02 MED ORDER — HYDRALAZINE HCL 20 MG/ML IJ SOLN: 10.0000 mg | Freq: Four times a day (QID) | INTRAMUSCULAR | Status: DC | PRN

## 2019-09-02 MED ORDER — STROKE: EARLY STAGES OF RECOVERY BOOK
Freq: Once | Status: AC
  Filled 2019-09-02: qty 1

## 2019-09-02 MED ORDER — ATORVASTATIN CALCIUM 40 MG PO TABS: 40.0000 mg | ORAL_TABLET | Freq: Every day | ORAL | Status: DC

## 2019-09-02 MED ORDER — AMOXICILLIN-POT CLAVULANATE 875-125 MG PO TABS
1.0000 | ORAL_TABLET | Freq: Two times a day (BID) | ORAL | Status: DC
  Administered 2019-09-02 – 2019-09-03 (×3): 1 via ORAL
  Filled 2019-09-02 (×3): qty 1

## 2019-09-02 MED ORDER — ASPIRIN 300 MG RE SUPP: 300.0000 mg | Freq: Every day | RECTAL | Status: DC

## 2019-09-02 MED ORDER — TRAMADOL HCL 50 MG PO TABS: 50.0000 mg | ORAL_TABLET | Freq: Four times a day (QID) | ORAL | Status: DC | PRN

## 2019-09-02 NOTE — Progress Notes (Signed)
*  PRELIMINARY RESULTS* Echocardiogram 2D Echocardiogram has been performed.  Grant Berry 09/02/2019, 10:00 AM

## 2019-09-02 NOTE — ED Notes (Signed)
ED TO INPATIENT HANDOFF REPORT  ED Nurse Name and Phone #:  [phone]  S Name/Age/Gender Grant Berry 74 y.o. male Room/Bed: APA03/APA03  Code Status   Code Status: Full Code  Home/SNF/Other Home Patient oriented to: self, place, time and situation Is this baseline? 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 History of HTN. Sinus arrhythmia with EMS. When patient stood up today, he was very weak.     Allergies No Known Allergies  Level of Care/Admitting Diagnosis ED Disposition    ED Disposition Condition Comment   Admit  Hospital Area: [facility] [[id number]]  Level of Care: Telemetry [5]  Covid Evaluation: Confirmed COVID Negative  Diagnosis: Cerebellar infarct [facility]) [[id number]]  Admitting Physician: Erick Blinks [[id number]]  Attending Physician: Erick Blinks [[id number]]  Estimated length of stay: past midnight tomorrow  Certification:: I certify this patient will need inpatient services for at least 2 midnights       B Medical/Surgery History Past Medical History:  Diagnosis Date  . Hyperlipidemia   . Hypertension    Past Surgical History:  Procedure Laterality Date  . TONSILECTOMY/ADENOIDECTOMY WITH MYRINGOTOMY       A IV Location/Drains/Wounds Patient Lines/Drains/Airways Status    Active Line/Drains/Airways    Name Placement date Placement time Site Days   Peripheral IV 09/01/19 Right Antecubital 09/01/19  --  Antecubital  1          Intake/Output Last 24 hours  Intake/Output Summary (Last 24 hours) at 09/02/2019 6546 Last data filed at 09/01/2019 2321 Gross per 24 hour  Intake 100 ml  Output --  Net 100 ml    Labs/Imaging Results for orders placed or performed during the hospital encounter of 09/01/19 (from the past 48  hour(s))  CBC     Status: None   Collection Time: 09/01/19  5:06 PM  Result Value Ref Range   WBC 7.3 4.0 - 10.5 K/uL   RBC 5.46 4.22 - 5.81 MIL/uL   Hemoglobin 16.4 13.0 - 17.0 g/dL   HCT 50.3 39 - 52 %   MCV 91.0 80.0 - 100.0 fL   MCH 30.0 26.0 - 34.0 pg   MCHC 33.0 30.0 - 36.0 g/dL   RDW 54.6 56.8 - 12.7 %   Platelets 206 150 - 400 K/uL   nRBC 0.0 0.0 - 0.2 %    Comment: Performed at [facility], [address], [place], [place] [address]  Basic metabolic panel     Status: Abnormal   Collection Time: 09/01/19  5:06 PM  Result Value Ref Range   Sodium 138 135 - 145 mmol/L   Potassium 4.0 3.5 - 5.1 mmol/L   Chloride 103 98 - 111 mmol/L   CO2 26 22 - 32 mmol/L   Glucose, Bld 97 70 - 99 mg/dL    Comment: Glucose reference range applies only to samples taken after fasting for at least 8 hours.   BUN 18 8 - 23 mg/dL   Creatinine, Ser 1.74 (H) 0.61 - 1.24 mg/dL   Calcium 9.4 8.9 - 94.4 mg/dL   GFR calc non Af Amer 54 (L) >60 mL/min   GFR calc Af Amer >60 >60 mL/min   Anion gap 9 5 - 15    Comment: Performed at [facility], [address], [place], [place] [address]  Magnesium     Status: None   Collection Time: 09/01/19  5:06 PM  Result Value Ref Range   Magnesium 2.2 1.7 - 2.4 mg/dL    Comment: Performed at [facility], [address] [place], [place] [address]  Troponin I (High Sensitivity)     Status: None   Collection Time: 09/01/19  6:57 PM  Result Value Ref Range   Troponin I (High Sensitivity) 6 <18 ng/L    Comment: (NOTE) Elevated high sensitivity troponin I (hsTnI) values and significant  changes across serial measurements may suggest ACS but many other  chronic and acute conditions are known to elevate hsTnI results.  Refer to the "Links" section for chest pain algorithms and additional  guidance. Performed at [facility], [address] [place], [place] [address]   Troponin I (High Sensitivity)     Status: None   Collection Time: 09/01/19  8:55 PM  Result  Value Ref Range   Troponin I (High Sensitivity) 6 <18 ng/L    Comment: (NOTE) Elevated high sensitivity troponin I (hsTnI) values and significant  changes across serial measurements may suggest ACS but many other  chronic and acute conditions are known to elevate hsTnI results.  Refer to the "Links" section for chest pain algorithms and additional  guidance. Performed at [facility], [address] [place], [place] [address]   SARS Coronavirus 2 by RT PCR (hospital order, performed in [facility] hospital lab) Nasopharyngeal Nasopharyngeal Swab     Status: None   Collection Time: 09/01/19  9:52 PM   Specimen: Nasopharyngeal Swab  Result Value Ref Range   SARS Coronavirus 2 NEGATIVE NEGATIVE    Comment: (NOTE) SARS-CoV-2 target nucleic acids are NOT DETECTED.  The SARS-CoV-2 RNA is generally detectable in upper and lower respiratory specimens during the acute phase of infection. The lowest concentration of SARS-CoV-2 viral copies this assay can detect is 250 copies / mL. A negative result does not preclude SARS-CoV-2 infection and should not be used as the sole basis for treatment or other patient management decisions.  A negative result may occur with improper specimen collection / handling, submission of specimen other than nasopharyngeal swab, presence of viral mutation(s) within the areas targeted by this assay, and inadequate number of viral copies (<250 copies / mL). A negative result must be combined with clinical observations, patient history, and epidemiological information.  Fact Sheet for Patients:   BoilerBrush.com.cyhttps://www.fda.gov/media/136312/download  Fact Sheet for Healthcare Providers: https://pope.com/https://www.fda.gov/media/136313/download  This test is not yet approved or  cleared by the [place] FDA and has been authorized for detection and/or diagnosis of SARS-CoV-2 by FDA under an Emergency Use Authorization (EUA).  This EUA will remain in effect (meaning this test can be used)  for the duration of the COVID-19 declaration under Section 564(b)(1) of the Act, 21 U.S.C. section 360bbb-3(b)(1), unless the authorization is terminated or revoked sooner.  Performed at [facility], [address] [place], [place] [address]   Lipid panel     Status: Abnormal   Collection Time: 09/02/19  5:36 AM  Result Value Ref Range   Cholesterol 277 (H) 0 - 200 mg/dL   Triglycerides 295205 (H) <150 mg/dL   HDL 29 (L) >62>40 mg/dL   Total CHOL/HDL Ratio 9.6 RATIO   VLDL 41 (H) 0 - 40 mg/dL   LDL Cholesterol 130207 (H) 0 - 99 mg/dL    Comment:        Total Cholesterol/HDL:CHD Risk Coronary Heart Disease Risk Table  Men   Women  1/2 Average Risk   3.4   3.3  Average Risk       5.0   4.4  2 X Average Risk   9.6   7.1  3 X Average Risk  23.4   11.0        Use the calculated Patient Ratio above and the CHD Risk Table to determine the patient's CHD Risk.        ATP III CLASSIFICATION (LDL):  <100     mg/dL   Optimal  606-301  mg/dL   Near or Above                    Optimal  130-159  mg/dL   Borderline  601-093  mg/dL   High  >235     mg/dL   Very High Performed at [facility], [address], [place], [place] [address]   Basic metabolic panel     Status: Abnormal   Collection Time: 09/02/19  5:36 AM  Result Value Ref Range   Sodium 138 135 - 145 mmol/L   Potassium 4.0 3.5 - 5.1 mmol/L   Chloride 103 98 - 111 mmol/L   CO2 25 22 - 32 mmol/L   Glucose, Bld 92 70 - 99 mg/dL    Comment: Glucose reference range applies only to samples taken after fasting for at least 8 hours.   BUN 17 8 - 23 mg/dL   Creatinine, Ser 0.25 (H) 0.61 - 1.24 mg/dL   Calcium 9.0 8.9 - 42.7 mg/dL   GFR calc non Af Amer 57 (L) >60 mL/min   GFR calc Af Amer >60 >60 mL/min   Anion gap 10 5 - 15    Comment: Performed at [facility], [address], [place], [place] [address]   CT HEAD WO CONTRAST  Result Date: 09/01/2019 CLINICAL DATA:  Vertigo, question left-sided weakness yesterday.  EXAM: CT HEAD WITHOUT CONTRAST TECHNIQUE: Contiguous axial images were obtained from the base of the skull through the vertex without intravenous contrast. COMPARISON:  No pertinent prior studies available for comparison. FINDINGS: Brain: Mild generalized parenchymal atrophy. 2.0 x 1.3 x 1.6 cm region of abnormal hypodensity within the left cerebellar hemisphere, favored to reflect an age-indeterminate infarct (series 2, image 10) (series 4, image 51). Mild ill-defined hypoattenuation within the cerebral white matter is nonspecific, but consistent with chronic small vessel ischemic disease. There is no acute intracranial hemorrhage. No demarcated cortical infarct is identified. No extra-axial fluid collection. No evidence of intracranial mass. No midline shift. Vascular: No hyperdense vessel.  Atherosclerotic calcifications. Skull: Normal. Negative for fracture or focal lesion. Sinuses/Orbits: Visualized orbits show no acute finding. No significant paranasal sinus disease or mastoid effusion at the imaged levels. IMPRESSION: 2 cm focus of abnormal hypodensity within the left cerebellar hemisphere. This is favored to reflect an age-indeterminate infarct, possibly acute/early subacute. Given the provided history of dizziness, consider brain MRI to assess for acuity and to exclude alternative etiologies. Background mild generalized parenchymal atrophy and cerebral white matter chronic small vessel ischemic disease. Electronically Signed   By: Jackey Loge DO   On: 09/01/2019 17:19   MR ANGIO HEAD WO CONTRAST  Result Date: 09/01/2019 CLINICAL DATA:  Left-sided weakness and dizziness. Abnormal cerebellar appearance by CT. EXAM: MRI HEAD WITHOUT CONTRAST MRA HEAD WITHOUT CONTRAST TECHNIQUE: Multiplanar, multiecho pulse sequences of the brain and surrounding structures were obtained without intravenous contrast. Angiographic images of the head were obtained using MRA technique  without contrast. COMPARISON:  Head CT same  day FINDINGS: MRI HEAD FINDINGS Brain: Acute infarction affecting the superior cerebellum on the left. No mass effect. No visible hemorrhage. No other acute intracranial finding. Brainstem itself appears normal. No old cerebellar insult. Cerebral hemispheres show mild chronic small-vessel change of the white matter. No hydrocephalus. 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 Vascular: Major vessels at the base of the brain show flow. Skull and upper cervical spine: Negative Sinuses/Orbits: Clear/normal Other: None MRA HEAD FINDINGS Both internal carotid arteries are widely patent through the skull base and siphon regions. The anterior and middle cerebral vessels are patent without proximal stenosis, aneurysm or vascular malformation. Distal vessels show atherosclerotic irregularity. Both vertebral arteries are patent to the basilar. No basilar stenosis. Mild atherosclerotic irregularity of the basilar artery. Right PICA is patent. Both anterior inferior cerebellar arteries are patent. No flow seen in a left superior cerebellar artery. 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 Distal PCA branches show atherosclerotic irregularity. IMPRESSION: Acute infarction in the left superior cerebellar artery distribution. No mass effect or hemorrhage. Mild chronic small-vessel ischemic change of the cerebral hemispheric white matter. No flow seen in the left superior cerebellar artery. Distal vessel atherosclerotic irregularity diffusely affecting the intracranial vessels. Electronically Signed   By: Paulina Fusi M.D.   On: 09/01/2019 20:45   MR BRAIN WO CONTRAST  Result Date: 09/01/2019 CLINICAL DATA:  Left-sided weakness and dizziness. Abnormal cerebellar appearance by CT. EXAM: MRI HEAD WITHOUT CONTRAST MRA HEAD WITHOUT CONTRAST TECHNIQUE: Multiplanar, multiecho pulse sequences of the brain and surrounding structures were obtained  without intravenous contrast. Angiographic images of the head were obtained using MRA technique without contrast. COMPARISON:  Head CT same day FINDINGS: MRI HEAD FINDINGS Brain: Acute infarction affecting the superior cerebellum on the left. No mass effect. No visible hemorrhage. No other acute intracranial finding. Brainstem itself appears normal. No old cerebellar insult. Cerebral hemispheres show mild chronic small-vessel change of the white matter. No hydrocephalus. 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 Vascular: Major vessels at the base of the brain show flow. Skull and upper cervical spine: Negative Sinuses/Orbits: Clear/normal Other: None MRA HEAD FINDINGS Both internal carotid arteries are widely patent through the skull base and siphon regions. The anterior and middle cerebral vessels are patent without proximal stenosis, aneurysm or vascular malformation. Distal vessels show atherosclerotic irregularity. Both vertebral arteries are patent to the basilar. No basilar stenosis. Mild atherosclerotic irregularity of the basilar artery. Right PICA is patent. Both anterior inferior cerebellar arteries are patent. No flow seen in a left superior cerebellar artery. 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 Distal PCA branches show atherosclerotic irregularity. IMPRESSION: Acute infarction in the left superior cerebellar artery distribution. No mass effect or hemorrhage. Mild chronic small-vessel ischemic change of the cerebral hemispheric white matter. No flow seen in the left superior cerebellar artery. Distal vessel atherosclerotic irregularity diffusely affecting the intracranial vessels. Electronically Signed   By: Paulina Fusi M.D.   On: 09/01/2019 20:45    Pending Labs Unresulted Labs (From admission, onward) Comment          Start     Ordered   09/02/19 0500  Hemoglobin A1c  Tomorrow morning,   R         09/01/19 2128  Vitals/Pain Today's Vitals   09/02/19 0500 09/02/19 0638 09/02/19 0843 09/02/19 0900  BP: (!) 153/90 (!) 181/93 (!) 161/91 (!) 177/121  Pulse: 86 79 (!) 104 97  Resp: 18 (!) 22 18 15   Temp:   98.2 F (36.8 C)   TempSrc:   Oral   SpO2: 96% 97% 98% 96%  Weight:      Height:        Isolation Precautions No active isolations  Medications Medications   stroke: mapping our early stages of recovery book (has no administration in time range)  0.9 %  sodium chloride infusion ( Intravenous New Bag/Given 09/01/19 2249)  acetaminophen (TYLENOL) tablet 650 mg (has no administration in time range)    Or  acetaminophen (TYLENOL) suppository 650 mg (has no administration in time range)  ondansetron (ZOFRAN) tablet 4 mg (has no administration in time range)    Or  ondansetron (ZOFRAN) injection 4 mg (has no administration in time range)  aspirin suppository 300 mg ( Rectal See Alternative 09/01/19 2248)    Or  aspirin tablet 325 mg (325 mg Oral Given 09/01/19 2248)  amoxicillin-clavulanate (AUGMENTIN) 875-125 MG per tablet 1 tablet (has no administration in time range)   stroke: mapping our early stages of recovery book (has no administration in time range)  clopidogrel (PLAVIX) tablet 75 mg (has no administration in time range)  traMADol (ULTRAM) tablet 50 mg (has no administration in time range)  meclizine (ANTIVERT) tablet 50 mg (50 mg Oral Given 09/01/19 1726)    Mobility walks     Focused Assessments    R Recommendations: See Admitting Provider Note  Report given to:   Additional Notes:

## 2019-09-02 NOTE — Progress Notes (Signed)
PROGRESS NOTE    Grant CreaseRichard L Berry  WUJ:[id number] DOB: 14-May-1945 DOA: 09/01/2019 PCP: Patient, No Pcp Per    Brief Narrative:  HPI: Grant Berry is a 74 y.o. male with medical history significant of hypertension, hyperlipidemia who is coming to the emergency department due to having a spinning sensation since yesterday particularly when he looks to the left or down.  He denies nausea, emesis, vision changes, headaches, but state he has also been experiencing left sided extremities weakness since yesterday.  He denies fever, chills, rhinorrhea, sore throat, dyspnea, wheezing or hemoptysis.  No chest pain, palpitations, diaphoresis, PND, orthopnea or pitting edema of the lower extremities.  Denies abdominal pain, diarrhea, constipation, melena or hematochezia.  No dysuria, frequency or hematuria.  ED Course: Initial vital signs were temperature 98.4 F, pulse 72, RR 15 bpm, BP 166/93 mmHg and O2 sat 100% on room air.  25 mg of meclizine were given in the ED.  CBC was normal.  BMP showed a creatinine of 1.31, increased from 1.22 mg/dL, otherwise the BMP is normal.  Troponin x2 6 ng/L.  ECG showed nonspecific ST abnormality.  Imaging: CT head showed a 2 cm focus of abnormal hypodensity within the left cerebellar hemisphere.  This was confirmed by MRI MRA of brain.  Please see images and full radiology report for further detail.   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 Acute cerebellar infarct.  Patient presented with dizziness left upper extremity weakness.  Imaging indicated acute superior cerebellar artery territory infarct.  Patient is not on any antiplatelet medications at home.  Further work-up including carotid Dopplers and echocardiogram has been  ordered.  Physical therapy evaluation.  Discussed with Dr. Amada JupiterKirkpatrick [facility] with recommendations for 3 weeks of dual antiplatelet therapy followed by single agent therapy.  Will start on high intensity statin.  Check lipid panel and A1c.  Patient will need outpatient referral to neurology, Dr. Pearlean BrownieSethi for follow-up. 2. Hyperlipidemia.  Previously on Lipitor, but not on any medications at present.  Will restart on Lipitor. 3. Hypertension.  Was previously on lisinopril, but has not taken this in several years.  Will allow for permissive hypertension over the next 24 hours.   DVT prophylaxis: SCDs Start: 09/01/19 2126  Code Status: full code Family Communication: discussed with patient, unable to reach family by phone Disposition Plan: Status is: Inpatient  Remains inpatient appropriate because:Ongoing diagnostic testing needed not appropriate for outpatient work up   Dispo: The patient is from: Home              Anticipated d/c is to: SNF              Anticipated d/c date is: 1 day              Patient currently is not medically stable to d/c.  Consultants:     Procedures:     Antimicrobials:       Subjective: Patient is laying down.  Feels that his left arm weakness is improving.  Unsure whether his dizziness improving since this is usually worse when he stands up.  Objective: Vitals:   09/02/19 1030 09/02/19 1230 09/02/19 1500 09/02/19 1630  BP: (!) 174/114 (!) 149/98 Marland Kitchen(!)  180/111 (!) 179/99  Pulse: 97 72  91  Resp: 20 18 18 19   Temp: 99 F (37.2 C) 98.8 F (37.1 C) 98.8 F (37.1 C) 97.7 F (36.5 C)  TempSrc: Oral Oral Oral Oral  SpO2: 99% 97% 97% 95%  Weight:      Height:        Intake/Output Summary (Last 24 hours) at 09/02/2019 1824 Last data filed at 09/02/2019 1500 Gross per 24 hour  Intake 1100 ml  Output --  Net 1100 ml   Filed Weights   09/01/19 1603  Weight: 81.2 kg    Examination:  General exam: Appears calm and  comfortable  Respiratory system: Clear to auscultation. Respiratory effort normal. Cardiovascular system: S1 & S2 heard, RRR. No JVD, murmurs, rubs, gallops or clicks. No pedal edema. Gastrointestinal system: Abdomen is nondistended, soft and nontender. No organomegaly or masses felt. Normal bowel sounds heard. Central nervous system: Alert and oriented. No focal neurological deficits. Extremities: Symmetric 5 x 5 power. Skin: No rashes, lesions or ulcers Psychiatry: Judgement and insight appear normal. Mood & affect appropriate.     Data Reviewed: I have personally reviewed following labs and imaging studies  CBC: Recent Labs  Lab 09/01/19 1706  WBC 7.3  HGB 16.4  HCT 49.7  MCV 91.0  PLT 206   Basic Metabolic Panel: Recent Labs  Lab 09/01/19 1706 09/02/19 0536  NA 138 138  K 4.0 4.0  CL 103 103  CO2 26 25  GLUCOSE 97 92  BUN 18 17  CREATININE 1.31* 1.25*  CALCIUM 9.4 9.0  MG 2.2  --    GFR: Estimated Creatinine Clearance: 53.7 mL/min (A) (by C-G formula based on SCr of 1.25 mg/dL (H)). Liver Function Tests: No results for input(s): AST, ALT, ALKPHOS, BILITOT, PROT, ALBUMIN in the last 168 hours. No results for input(s): LIPASE, AMYLASE in the last 168 hours. No results for input(s): AMMONIA in the last 168 hours. Coagulation Profile: No results for input(s): INR, PROTIME in the last 168 hours. Cardiac Enzymes: No results for input(s): CKTOTAL, CKMB, CKMBINDEX, TROPONINI in the last 168 hours. BNP (last 3 results) No results for input(s): PROBNP in the last 8760 hours. HbA1C: Recent Labs    09/02/19 0536  HGBA1C 5.6   CBG: No results for input(s): GLUCAP in the last 168 hours. Lipid Profile: Recent Labs    09/02/19 0536  CHOL 277*  HDL 29*  LDLCALC 207*  TRIG 205*  CHOLHDL 9.6   Thyroid Function Tests: No results for input(s): TSH, T4TOTAL, FREET4, T3FREE, THYROIDAB in the last 72 hours. Anemia Panel: No results for input(s): VITAMINB12, FOLATE,  FERRITIN, TIBC, IRON, RETICCTPCT in the last 72 hours. Sepsis Labs: No results for input(s): PROCALCITON, LATICACIDVEN in the last 168 hours.  Recent Results (from the past 240 hour(s))  SARS Coronavirus 2 by RT PCR (hospital order, performed in [facility] hospital lab) Nasopharyngeal Nasopharyngeal Swab     Status: None   Collection Time: 09/01/19  9:52 PM   Specimen: Nasopharyngeal Swab  Result Value Ref Range Status   SARS Coronavirus 2 NEGATIVE NEGATIVE Final    Comment: (NOTE) SARS-CoV-2 target nucleic acids are NOT DETECTED.  The SARS-CoV-2 RNA is generally detectable in upper and lower respiratory specimens during the acute phase of infection. The lowest concentration of SARS-CoV-2 viral copies this assay can detect is 250 copies / mL. A negative result does not preclude SARS-CoV-2 infection and should not be used as the sole basis for treatment  or other patient management decisions.  A negative result may occur with improper specimen collection / handling, submission of specimen other than nasopharyngeal swab, presence of viral mutation(s) within the areas targeted by this assay, and inadequate number of viral copies (<250 copies / mL). A negative result must be combined with clinical observations, patient history, and epidemiological information.  Fact Sheet for Patients:   BoilerBrush.com.cy  Fact Sheet for Healthcare Providers: https://pope.com/  This test is not yet approved or  cleared by the [place] FDA and has been authorized for detection and/or diagnosis of SARS-CoV-2 by FDA under an Emergency Use Authorization (EUA).  This EUA will remain in effect (meaning this test can be used) for the duration of the COVID-19 declaration under Section 564(b)(1) of the Act, 21 U.S.C. section 360bbb-3(b)(1), unless the authorization is terminated or revoked sooner.  Performed at [facility], [address]  [place], [place] [address]          Radiology Studies: CT HEAD WO CONTRAST  Result Date: 09/01/2019 CLINICAL DATA:  Vertigo, question left-sided weakness yesterday. EXAM: CT HEAD WITHOUT CONTRAST TECHNIQUE: Contiguous axial images were obtained from the base of the skull through the vertex without intravenous contrast. COMPARISON:  No pertinent prior studies available for comparison. FINDINGS: Brain: Mild generalized parenchymal atrophy. 2.0 x 1.3 x 1.6 cm region of abnormal hypodensity within the left cerebellar hemisphere, favored to reflect an age-indeterminate infarct (series 2, image 10) (series 4, image 51). Mild ill-defined hypoattenuation within the cerebral white matter is nonspecific, but consistent with chronic small vessel ischemic disease. There is no acute intracranial hemorrhage. No demarcated cortical infarct is identified. No extra-axial fluid collection. No evidence of intracranial mass. No midline shift. Vascular: No hyperdense vessel.  Atherosclerotic calcifications. Skull: Normal. Negative for fracture or focal lesion. Sinuses/Orbits: Visualized orbits show no acute finding. No significant paranasal sinus disease or mastoid effusion at the imaged levels. IMPRESSION: 2 cm focus of abnormal hypodensity within the left cerebellar hemisphere. This is favored to reflect an age-indeterminate infarct, possibly acute/early subacute. Given the provided history of dizziness, consider brain MRI to assess for acuity and to exclude alternative etiologies. Background mild generalized parenchymal atrophy and cerebral white matter chronic small vessel ischemic disease. Electronically Signed   By: Jackey Loge DO   On: 09/01/2019 17:19   MR ANGIO HEAD WO CONTRAST  Result Date: 09/01/2019 CLINICAL DATA:  Left-sided weakness and dizziness. Abnormal cerebellar appearance by CT. EXAM: MRI HEAD WITHOUT CONTRAST MRA HEAD WITHOUT CONTRAST TECHNIQUE: Multiplanar, multiecho pulse sequences of the brain and  surrounding structures were obtained without intravenous contrast. Angiographic images of the head were obtained using MRA technique without contrast. COMPARISON:  Head CT same day FINDINGS: MRI HEAD FINDINGS Brain: Acute infarction affecting the superior cerebellum on the left. No mass effect. No visible hemorrhage. No other acute intracranial finding. Brainstem itself appears normal. No old cerebellar insult. Cerebral hemispheres show mild chronic small-vessel change of the white matter. No hydrocephalus. 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 Vascular: Major vessels at the base of the brain show flow. Skull and upper cervical spine: Negative Sinuses/Orbits: Clear/normal Other: None MRA HEAD FINDINGS Both internal carotid arteries are widely patent through the skull base and siphon regions. The anterior and middle cerebral vessels are patent without proximal stenosis, aneurysm or vascular malformation. Distal vessels show atherosclerotic irregularity. Both vertebral arteries are patent to the basilar.  No basilar stenosis. Mild atherosclerotic irregularity of the basilar artery. Right PICA is patent. Both anterior inferior cerebellar arteries are patent. No flow seen in a left superior cerebellar artery. 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 Distal PCA branches show atherosclerotic irregularity. IMPRESSION: Acute infarction in the left superior cerebellar artery distribution. No mass effect or hemorrhage. Mild chronic small-vessel ischemic change of the cerebral hemispheric white matter. No flow seen in the left superior cerebellar artery. Distal vessel atherosclerotic irregularity diffusely affecting the intracranial vessels. Electronically Signed   By: Paulina Fusi M.D.   On: 09/01/2019 20:45   MR BRAIN WO CONTRAST  Result Date: 09/01/2019 CLINICAL DATA:  Left-sided weakness and dizziness. Abnormal cerebellar  appearance by CT. EXAM: MRI HEAD WITHOUT CONTRAST MRA HEAD WITHOUT CONTRAST TECHNIQUE: Multiplanar, multiecho pulse sequences of the brain and surrounding structures were obtained without intravenous contrast. Angiographic images of the head were obtained using MRA technique without contrast. COMPARISON:  Head CT same day FINDINGS: MRI HEAD FINDINGS Brain: Acute infarction affecting the superior cerebellum on the left. No mass effect. No visible hemorrhage. No other acute intracranial finding. Brainstem itself appears normal. No old cerebellar insult. Cerebral hemispheres show mild chronic small-vessel change of the white matter. No hydrocephalus. 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 Vascular: Major vessels at the base of the brain show flow. Skull and upper cervical spine: Negative Sinuses/Orbits: Clear/normal Other: None MRA HEAD FINDINGS Both internal carotid arteries are widely patent through the skull base and siphon regions. The anterior and middle cerebral vessels are patent without proximal stenosis, aneurysm or vascular malformation. Distal vessels show atherosclerotic irregularity. Both vertebral arteries are patent to the basilar. No basilar stenosis. Mild atherosclerotic irregularity of the basilar artery. Right PICA is patent. Both anterior inferior cerebellar arteries are patent. No flow seen in a left superior cerebellar artery. 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 Distal PCA branches show atherosclerotic irregularity. IMPRESSION: Acute infarction in the left superior cerebellar artery distribution. No mass effect or hemorrhage. Mild chronic small-vessel ischemic change of the cerebral hemispheric white matter. No flow seen in the left superior cerebellar artery. Distal vessel atherosclerotic irregularity diffusely affecting the intracranial vessels. Electronically Signed   By: Paulina Fusi M.D.   On:  09/01/2019 20:45   US Carotid Bilateral (at [facility] and [facility] only)  Result Date: 09/02/2019 CLINICAL DATA:  74 year old male with vertigo and left-sided weakness for the past 2 days EXAM: BILATERAL CAROTID DUPLEX ULTRASOUND TECHNIQUE: Wallace Cullens scale imaging, color Doppler and duplex ultrasound were performed of bilateral carotid and vertebral arteries in the neck. COMPARISON:  None. FINDINGS: Criteria: Quantification of carotid stenosis is based on velocity parameters that correlate the residual internal carotid diameter with NASCET-based stenosis levels, using the diameter of the distal internal carotid lumen as the denominator for stenosis measurement. The following velocity measurements were obtained: RIGHT ICA: 63/19 cm/sec CCA: 79/17 cm/sec SYSTOLIC ICA/CCA RATIO:  0.8 ECA:  132 cm/sec LEFT ICA: 135/35 cm/sec CCA: 109/14 cm/sec SYSTOLIC ICA/CCA RATIO:  1.2 ECA:  172 cm/sec RIGHT CAROTID ARTERY: Heterogeneous atherosclerotic plaque in the proximal internal carotid artery. By peak systolic velocity criteria, the estimated stenosis remains less than 50%. RIGHT VERTEBRAL ARTERY:  Patent with normal antegrade flow. LEFT CAROTID ARTERY: Moderate heterogeneous atherosclerotic plaque in the proximal internal carotid artery. By peak systolic velocity criteria, the estimated stenosis falls in the 50-69% diameter range. LEFT VERTEBRAL ARTERY:  Patent with normal antegrade flow. IMPRESSION: 1. Mild (1-49%) stenosis proximal right internal carotid artery secondary to mild heterogeneous atherosclerotic plaque. 2. 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 3. The vertebral arteries are patent with normal antegrade flow. Signed, Sterling Big, MD, RPVI [facility] Radiology Electronically Signed   By: Malachy Moan M.D.   On: 09/02/2019 11:49        Scheduled Meds: . amoxicillin-clavulanate  1  tablet Oral Q12H  . aspirin  300 mg Rectal Daily   Or  . aspirin  325 mg Oral Daily  . clopidogrel  75 mg Oral Daily   Continuous Infusions: . sodium chloride 88 mL/hr at 09/01/19 2249     LOS: 1 day    Time spent:    Erick Blinks, MD [facility]   If 7PM-7AM, please contact night-coverage [email or web address]  09/02/2019, 6:24 PM

## 2019-09-02 NOTE — Plan of Care (Signed)
  Problem: Acute Rehab PT Goals(only PT should resolve) Goal: Pt Will Perform Standing Balance Or Pre-Gait Flowsheets (Taken 09/02/2019 1228) Pt will perform standing balance or pre-gait:  1-2 min  with no UE support Goal: Pt Will Ambulate Flowsheets (Taken 09/02/2019 1228) Pt will Ambulate:  > 125 feet  with modified independence  with rolling walker Goal: Pt Will Go Up/Down Stairs Flowsheets (Taken 09/02/2019 1228) Pt will Go Up / Down Stairs:  3-5 stairs  with rail(s)  with modified independence  12:29 PM, 09/02/19 Georges Lynch PT DPT  Physical Therapist with [facility]  [phone]

## 2019-09-02 NOTE — Evaluation (Signed)
Physical Therapy Evaluation Patient Details Name: Grant Berry MRN: [id number] DOB: [birth year] Today's Date: 09/02/2019   History of Present Illness  Grant Berry is a 74 y.o. male with medical history significant of hypertension, hyperlipidemia who is coming to the emergency department due to having a spinning sensation since yesterday particularly when he looks to the left or down.  He denies nausea, emesis, vision changes, headaches, but state he has also been experiencing left sided extremities weakness since yesterday.  He denies fever, chills, rhinorrhea, sore throat, dyspnea, wheezing or hemoptysis.  No chest pain, palpitations, diaphoresis, PND, orthopnea or pitting edema of the lower extremities.  Denies abdominal pain, diarrhea, constipation, melena or hematochezia.  No dysuria, frequency or hematuria.    Clinical Impression  Patient presents supine in bed with HOB elevated. Patein is awake and alert. Patient states he does not feel as weak as before, but continues to have trouble with dizziness affecting his balance. Patient able to perform supine to sit at bedside Mod I, with slowed movement, using UE assist. Patient tolerated seated at bed side with minimal increase in dizziness. BLE MMT performed at bedside [facility] and BLE sensation to light touch appears intact. Patient able to perform sit to stand with mod I and ambulated 40 feet in hallway with RW. Patient with noted gait deviations and shows left drift, unable to significantly correct with verbal cues, though is aware. Patient with LOB x 1, corrected with therapists assist. Patient reports constant level of dizziness throughout functional activity, says it is made worse when looking down, reports minimal fatigue. Patient returned to bed and able to reposition Mod I. Patient left in bed with [facility] elevated phone and call bell in reach. Patient will benefit from continued physical therapy in hospital and recommended venue below to  increase strength, balance, endurance for safe ADLs and gait.      Follow Up Recommendations Supervision/Assistance - 24 hour;Supervision for mobility/OOB;SNF    Equipment Recommendations       Recommendations for Other Services       Precautions / Restrictions Precautions Precautions: Fall Restrictions Weight Bearing Restrictions: No      Mobility  Bed Mobility Overal bed mobility: Modified Independent                Transfers Overall transfer level: Modified independent Equipment used: Rolling walker (2 wheeled)                Ambulation/Gait Ambulation/Gait assistance: Min guard Gait Distance (Feet): 40 Feet Assistive device: Rolling walker (2 wheeled) Gait Pattern/deviations: Step-to pattern;Decreased step length - left;Decreased stride length;Narrow base of support;Drifts right/left Gait velocity: Decreased   General Gait Details: Small step gait, heavy UE support, leans left, LOB x 1 to left, corrected with therapist assist  Stairs            Wheelchair Mobility    Modified Rankin (Stroke Patients Only)       Balance Overall balance assessment: Needs assistance Sitting-balance support: Feet supported;No upper extremity supported Sitting balance-Leahy Scale: Good Sitting balance - Comments: seated at EOB Postural control: Left lateral lean Standing balance support: Bilateral upper extremity supported Standing balance-Leahy Scale: Poor Standing balance comment: Required RW for standing balance                             Pertinent Vitals/Pain Pain Assessment: No/denies pain    Home Living Family/patient expects to be discharged to:: Private residence  Living Arrangements: Children (Son lives with him, works 2nd shift at nursing home) Available Help at Discharge: Family   Home Access: Stairs to enter Entrance Stairs-Rails: Can reach both Secretary/administrator of Steps: 4 Home Layout: One level Home Equipment: Environmental consultant  - 2 wheels;Walker - 4 wheels      Prior Function Level of Independence: Independent               Hand Dominance        Extremity/Trunk Assessment   Upper Extremity Assessment Upper Extremity Assessment: Defer to OT evaluation    Lower Extremity Assessment Lower Extremity Assessment: Overall WFL for tasks assessed       Communication   Communication: No difficulties  Cognition Arousal/Alertness: Awake/alert Behavior During Therapy: WFL for tasks assessed/performed Overall Cognitive Status: Within Functional Limits for tasks assessed                                        General Comments      Exercises     Assessment/Plan    PT Assessment Patient needs continued PT services  PT Problem List Decreased balance       PT Treatment Interventions Patient/family education;Therapeutic exercise;Gait training;Balance training;Functional mobility training;Therapeutic activities    PT Goals (Current goals can be found in the Care Plan section)  Acute Rehab PT Goals Patient Stated Goal: Return Home PT Goal Formulation: With patient Time For Goal Achievement: 09/16/19 Potential to Achieve Goals: Good    Frequency Min 6X/week   Barriers to discharge        Co-evaluation               AM-PAC PT "6 Clicks" Mobility  Outcome Measure Help needed turning from your back to your side while in a flat bed without using bedrails?: None Help needed moving from lying on your back to sitting on the side of a flat bed without using bedrails?: A Little Help needed moving to and from a bed to a chair (including a wheelchair)?: A Little Help needed standing up from a chair using your arms (e.g., wheelchair or bedside chair)?: A Little Help needed to walk in hospital room?: A Little Help needed climbing 3-5 steps with a railing? : A Lot 6 Click Score: 18    End of Session Equipment Utilized During Treatment: Gait belt Activity Tolerance: Patient  tolerated treatment well Patient left: in bed;with call bell/phone within reach Nurse Communication: Mobility status PT Visit Diagnosis: Unsteadiness on feet (R26.81);Other abnormalities of gait and mobility (R26.89)    Time: 1135-1200 PT Time Calculation (min) (ACUTE ONLY): 25 min   Charges:   PT Evaluation $PT Eval Low Complexity: 1 Low        12:26 PM, 09/02/19 Georges Lynch PT DPT  Physical Therapist with [facility]  [phone]

## 2019-09-02 NOTE — ED Notes (Signed)
Pt's son states he is leaving and needs a note for work; note given

## 2019-09-03 LAB — ECHOCARDIOGRAM COMPLETE
Height: 67 in
Weight: 2864.22 oz

## 2019-09-03 MED ORDER — ATORVASTATIN CALCIUM 80 MG PO TABS: 80.0000 mg | ORAL_TABLET | Freq: Every day | ORAL | 0 refills | Status: DC

## 2019-09-03 MED ORDER — CLOPIDOGREL BISULFATE 75 MG PO TABS: 75.0000 mg | ORAL_TABLET | Freq: Every day | ORAL | 0 refills | Status: DC

## 2019-09-03 MED ORDER — AMLODIPINE BESYLATE 5 MG PO TABS
5.0000 mg | ORAL_TABLET | Freq: Every day | ORAL | 11 refills | Status: DC
Start: 2019-09-03 — End: 2019-10-17

## 2019-09-03 MED ORDER — AMOXICILLIN-POT CLAVULANATE 875-125 MG PO TABS: 1.0000 | ORAL_TABLET | Freq: Two times a day (BID) | ORAL | 0 refills | Status: DC

## 2019-09-03 MED ORDER — ASPIRIN 325 MG PO TABS: 325.0000 mg | ORAL_TABLET | Freq: Every day | ORAL | 1 refills | Status: DC

## 2019-09-03 NOTE — Plan of Care (Signed)
  Problem: Education: Goal: Knowledge of General Education information will improve Description: Including pain rating scale, medication(s)/side effects and non-pharmacologic comfort measures 09/03/2019 1526 by Karolee Ohs, RN Outcome: Adequate for Discharge 09/03/2019 1520 by Karolee Ohs, RN Outcome: Progressing   Problem: Health Behavior/Discharge Planning: Goal: Ability to manage health-related needs will improve 09/03/2019 1526 by Karolee Ohs, RN Outcome: Adequate for Discharge 09/03/2019 1520 by Karolee Ohs, RN Outcome: Progressing   Problem: Clinical Measurements: Goal: Ability to maintain clinical measurements within normal limits will improve Outcome: Adequate for Discharge Goal: Will remain free from infection Outcome: Adequate for Discharge Goal: Diagnostic test results will improve Outcome: Adequate for Discharge Goal: Respiratory complications will improve Outcome: Adequate for Discharge Goal: Cardiovascular complication will be avoided Outcome: Adequate for Discharge   Problem: Activity: Goal: Risk for activity intolerance will decrease Outcome: Adequate for Discharge   Problem: Nutrition: Goal: Adequate nutrition will be maintained Outcome: Adequate for Discharge   Problem: Coping: Goal: Level of anxiety will decrease Outcome: Adequate for Discharge   Problem: Elimination: Goal: Will not experience complications related to bowel motility Outcome: Adequate for Discharge Goal: Will not experience complications related to urinary retention Outcome: Adequate for Discharge   Problem: Pain Managment: Goal: General experience of comfort will improve Outcome: Adequate for Discharge   Problem: Safety: Goal: Ability to remain free from injury will improve Outcome: Adequate for Discharge   Problem: Skin Integrity: Goal: Risk for impaired skin integrity will decrease Outcome: Adequate for Discharge   Problem: Education: Goal: Knowledge of disease or  condition will improve 09/03/2019 1526 by Karolee Ohs, RN Outcome: Adequate for Discharge 09/03/2019 1524 by Karolee Ohs, RN Outcome: Progressing Goal: Knowledge of secondary prevention will improve 09/03/2019 1526 by Karolee Ohs, RN Outcome: Adequate for Discharge 09/03/2019 1524 by Karolee Ohs, RN Outcome: Progressing Goal: Knowledge of patient specific risk factors addressed and post discharge goals established will improve 09/03/2019 1526 by Karolee Ohs, RN Outcome: Adequate for Discharge 09/03/2019 1524 by Karolee Ohs, RN Outcome: Progressing Goal: Individualized Educational Video(s) 09/03/2019 1526 by Karolee Ohs, RN Outcome: Adequate for Discharge 09/03/2019 1524 by Karolee Ohs, RN Outcome: Progressing   Problem: Coping: Goal: Will verbalize positive feelings about self 09/03/2019 1526 by Karolee Ohs, RN Outcome: Adequate for Discharge 09/03/2019 1524 by Karolee Ohs, RN Outcome: Progressing Goal: Will identify appropriate support needs 09/03/2019 1526 by Karolee Ohs, RN Outcome: Adequate for Discharge 09/03/2019 1524 by Karolee Ohs, RN Outcome: Progressing   Problem: Health Behavior/Discharge Planning: Goal: Ability to manage health-related needs will improve Outcome: Adequate for Discharge   Problem: Self-Care: Goal: Ability to participate in self-care as condition permits will improve Outcome: Adequate for Discharge Goal: Verbalization of feelings and concerns over difficulty with self-care will improve Outcome: Adequate for Discharge Goal: Ability to communicate needs accurately will improve Outcome: Adequate for Discharge   Problem: Nutrition: Goal: Risk of aspiration will decrease Outcome: Adequate for Discharge Goal: Dietary intake will improve Outcome: Adequate for Discharge   Problem: Ischemic Stroke/TIA Tissue Perfusion: Goal: Complications of ischemic stroke/TIA will be minimized 09/03/2019 1526 by Karolee Ohs,  RN Outcome: Adequate for Discharge 09/03/2019 1524 by Karolee Ohs, RN Outcome: Progressing

## 2019-09-03 NOTE — Progress Notes (Signed)
Nsg Discharge Note  Admit Date:  09/01/2019 Discharge date: 09/03/2019   Gilda Crease to be D/C'd Home per MD order.  AVS completed.  Copy for chart, and copy for patient signed, and dated. Removed IV-clean, dry, intact. Reviewed d/c paperwork with patient and friend. Reviewed stroke education and BEFAST. Wheeled stable patient and belongings to main entrance where he was picked up by his friend to d/c to home. Patient/caregiver able to verbalize understanding.  Discharge Medication: Allergies as of 09/03/2019   No Known Allergies     Medication List    TAKE these medications   amLODipine 5 MG tablet Commonly known as: NORVASC Take 1 tablet (5 mg total) by mouth daily.   amoxicillin-clavulanate 875-125 MG tablet Commonly known as: AUGMENTIN Take 1 tablet by mouth every 12 (twelve) hours.   aspirin 325 MG tablet Take 1 tablet (325 mg total) by mouth daily. Start taking on: September 04, 2019   atorvastatin 80 MG tablet Commonly known as: LIPITOR Take 1 tablet (80 mg total) by mouth daily at 6 PM.   clopidogrel 75 MG tablet Commonly known as: PLAVIX Take 1 tablet (75 mg total) by mouth daily. Start taking on: September 04, 2019       Discharge Assessment: Vitals:   09/03/19 1030 09/03/19 1430  BP: (!) 171/92 (!) 154/91  Pulse: 79 83  Resp: 18 18  Temp: 98.2 F (36.8 C) 97.9 F (36.6 C)  SpO2: 97% 96%   Skin clean, dry and intact without evidence of skin break down, no evidence of skin tears noted. IV catheter discontinued intact. Site without signs and symptoms of complications - no redness or edema noted at insertion site, patient denies c/o pain - only slight tenderness at site.  Dressing with slight pressure applied.  D/c Instructions-Education: Discharge instructions given to patient/family with verbalized understanding. D/c education completed with patient/family including follow up instructions, medication list, d/c activities limitations if indicated, with other  d/c instructions as indicated by MD - patient able to verbalize understanding, all questions fully answered. Patient instructed to return to ED, call 911, or call MD for any changes in condition.  Patient escorted via WC, and D/C home via private auto.  Karolee Ohs, RN 09/03/2019 3:43 PM

## 2019-09-03 NOTE — Discharge Instructions (Signed)
Stroke Prevention Some medical conditions and behaviors are associated with a higher chance of having a stroke. You can help prevent a stroke by making nutrition, lifestyle, and other changes, including managing any medical conditions you may have. What nutrition changes can be made?   Eat healthy foods. You can do this by: ? Choosing foods high in fiber, such as fresh fruits and vegetables and whole grains. ? Eating at least 5 or more servings of fruits and vegetables a day. Try to fill half of your plate at each meal with fruits and vegetables. ? Choosing lean protein foods, such as lean cuts of meat, poultry without skin, fish, tofu, beans, and nuts. ? Eating low-fat dairy products. ? Avoiding foods that are high in salt (sodium). This can help lower blood pressure. ? Avoiding foods that have saturated fat, trans fat, and cholesterol. This can help prevent high cholesterol. ? Avoiding processed and premade foods.  Follow your health care provider's specific guidelines for losing weight, controlling high blood pressure (hypertension), lowering high cholesterol, and managing diabetes. These may include: ? Reducing your daily calorie intake. ? Limiting your daily sodium intake to 1,500 milligrams (mg). ? Using only healthy fats for cooking, such as olive oil, canola oil, or sunflower oil. ? Counting your daily carbohydrate intake. What lifestyle changes can be made?  Maintain a healthy weight. Talk to your health care provider about your ideal weight.  Get at least 30 minutes of moderate physical activity at least 5 days a week. Moderate activity includes brisk walking, biking, and swimming.  Do not use any products that contain nicotine or tobacco, such as cigarettes and e-cigarettes. If you need help quitting, ask your health care provider. It may also be helpful to avoid exposure to secondhand smoke.  Limit alcohol intake to no more than 1 drink a day for nonpregnant women and 2 drinks  a day for men. One drink equals 12 oz of beer, 5 oz of wine, or 1 oz of hard liquor.  Stop any illegal drug use.  Avoid taking birth control pills. Talk to your health care provider about the risks of taking birth control pills if: ? You are over 35 years old. ? You smoke. ? You get migraines. ? You have ever had a blood clot. What other changes can be made?  Manage your cholesterol levels. ? Eating a healthy diet is important for preventing high cholesterol. If cholesterol cannot be managed through diet alone, you may also need to take medicines. ? Take any prescribed medicines to control your cholesterol as told by your health care provider.  Manage your diabetes. ? Eating a healthy diet and exercising regularly are important parts of managing your blood sugar. If your blood sugar cannot be managed through diet and exercise, you may need to take medicines. ? Take any prescribed medicines to control your diabetes as told by your health care provider.  Control your hypertension. ? To reduce your risk of stroke, try to keep your blood pressure below 130/80. ? Eating a healthy diet and exercising regularly are an important part of controlling your blood pressure. If your blood pressure cannot be managed through diet and exercise, you may need to take medicines. ? Take any prescribed medicines to control hypertension as told by your health care provider. ? Ask your health care provider if you should monitor your blood pressure at home. ? Have your blood pressure checked every year, even if your blood pressure is normal. Blood pressure increases   with age and some medical conditions.  Get evaluated for sleep disorders (sleep apnea). Talk to your health care provider about getting a sleep evaluation if you snore a lot or have excessive sleepiness.  Take over-the-counter and prescription medicines only as told by your health care provider. Aspirin or blood thinners (antiplatelets or  anticoagulants) may be recommended to reduce your risk of forming blood clots that can lead to stroke.  Make sure that any other medical conditions you have, such as atrial fibrillation or atherosclerosis, are managed. What are the warning signs of a stroke? The warning signs of a stroke can be easily remembered as BEFAST.  B is for balance. Signs include: ? Dizziness. ? Loss of balance or coordination. ? Sudden trouble walking.  E is for eyes. Signs include: ? A sudden change in vision. ? Trouble seeing.  F is for face. Signs include: ? Sudden weakness or numbness of the face. ? The face or eyelid drooping to one side.  A is for arms. Signs include: ? Sudden weakness or numbness of the arm, usually on one side of the body.  S is for speech. Signs include: ? Trouble speaking (aphasia). ? Trouble understanding.  T is for time. ? These symptoms may represent a serious problem that is an emergency. Do not wait to see if the symptoms will go away. Get medical help right away. Call your local emergency services (911 in the [place] Do not drive yourself to the hospital.  Other signs of stroke may include: ? A sudden, severe headache with no known cause. ? Nausea or vomiting. ? Seizure. Where to find more information For more information, visit:  American Stroke Association: www.strokeassociation.org  National Stroke Association: www.stroke.org Summary  You can prevent a stroke by eating healthy, exercising, not smoking, limiting alcohol intake, and managing any medical conditions you may have.  Do not use any products that contain nicotine or tobacco, such as cigarettes and e-cigarettes. If you need help quitting, ask your health care provider. It may also be helpful to avoid exposure to secondhand smoke.  Remember BEFAST for warning signs of stroke. Get help right away if you or a loved one has any of these signs. This information is not intended to replace advice given to you  by your health care provider. Make sure you discuss any questions you have with your health care provider. Document Revised: 01/22/2017 Document Reviewed: 03/17/2016 Elsevier Patient Education  2020 [employer]  

## 2019-09-03 NOTE — Plan of Care (Signed)
  Problem: Education: Goal: Knowledge of General Education information will improve Description: Including pain rating scale, medication(s)/side effects and non-pharmacologic comfort measures Outcome: Progressing   Problem: Health Behavior/Discharge Planning: Goal: Ability to manage health-related needs will improve Outcome: Progressing   Problem: Education: Goal: Knowledge of disease or condition will improve Outcome: Progressing Goal: Knowledge of secondary prevention will improve Outcome: Progressing Goal: Knowledge of patient specific risk factors addressed and post discharge goals established will improve Outcome: Progressing Goal: Individualized Educational Video(s) Outcome: Progressing   Problem: Coping: Goal: Will verbalize positive feelings about self Outcome: Progressing Goal: Will identify appropriate support needs Outcome: Progressing   Problem: Ischemic Stroke/TIA Tissue Perfusion: Goal: Complications of ischemic stroke/TIA will be minimized Outcome: Progressing

## 2019-09-03 NOTE — Discharge Summary (Signed)
Physician Discharge Summary  Grant Berry:[id number] DOB: December 09, 1945 DOA: 09/01/2019  PCP: Patient, No Pcp Per  Admit date: 09/01/2019 Discharge date: 09/03/2019  Admitted From: Home Disposition: Home  Recommendations for Outpatient Follow-up:  1. Follow up with PCP in 1-2 weeks 2. Please obtain BMP/CBC in one week 3. Referral made for outpatient follow-up with neurology 4. Referral made for outpatient follow-up with vascular surgery  Home Health: Physical therapy recommended skilled nursing facility placement.  Patient and family have elected to discharge home with home health PT. Equipment/Devices: Walker  Discharge Condition: Stable CODE STATUS: Full code Diet recommendation: Heart healthy  Brief/Interim Summary: Grant L Williamsis a 74 y.o.malewith medical history significant ofhypertension, hyperlipidemia who is coming to the emergency department due tohaving a spinning sensation since yesterday particularly when he looks to the left or down. He denies nausea, emesis, vision changes, headaches, but state he has also been experiencing left sided extremities weakness since yesterday. He denies fever, chills, rhinorrhea, sore throat, dyspnea, wheezing or hemoptysis. No chest pain, palpitations, diaphoresis, PND, orthopnea or pitting edema of the lower extremities. Denies abdominal pain, diarrhea, constipation, melena or hematochezia. No dysuria, frequency or hematuria.  ED Course:Initial vital signs were temperature98.4 F, pulse 72, RR 15 bpm, BP 166/93 mmHg and O2 sat 100% on room air. 25 mg of meclizine were given in the ED.  CBC was normal. BMP showed a creatinine of 1.31, increased from 1.22 mg/dL, otherwise the BMP is normal. Troponin x2 6 ng/L. ECG showed nonspecific ST abnormality.  Imaging:CT head showed a 2 cm focus of abnormal hypodensity within the left cerebellar hemisphere. This was confirmed by MRI MRA of brain. Please see images and  full radiology report for further detail.  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 Acute cerebellar infarct.  Patient presented with dizziness and left upper extremity weakness.  Imaging indicated acute superior cerebellar artery territory infarct.  Patient was not taking any antiplatelet medications prior to admission.  His case and images was reviewed with Dr. Amada Jupiter on-call for neurology and was called with recommendations for 3 weeks of dual antiplatelet therapy followed by single agent therapy.  Patient underwent echocardiogram that was found to be unremarkable.  Lipid panel showed LDL greater than 200.  He has been started on high-dose Lipitor.  A1c was checked and found to be 5.6.  He was started on amlodipine for high blood pressure.  He was seen by physical therapy with recommendations for skilled nursing facility placement, but family and patient have elected to discharge home with home health physical therapy.  Carotid Dopplers were also performed that showed mild stenosis in the right internal carotid artery as well as moderate stenosis (50 to 69%) in the left internal carotid artery.  This is unlikely causing the patient's infarct.  Outpatient referral to vascular surgery has been made for further surveillance.  He is also been referred to neurology for post discharge follow-up. 2. Hyperlipidemia.  Started on high-dose Lipitor 3. Hypertension.  Started on amlodipine 4. Carotid stenosis.  Patient found to have left internal carotid stenosis 50 to 69%.  Referred to vascular surgery.  Discharge Instructions  Discharge Instructions    Ambulatory referral to Neurology   Complete by: As directed    An appointment is requested  in approximately: next  available, patient needs hospital follow up of stroke   Ambulatory referral to Vascular Surgery   Complete by: As directed    Patient needs next available appointment for evaluation of carotid stenosis   Diet - low sodium heart healthy   Complete by: As directed    Increase activity slowly   Complete by: As directed    Walker rolling   Complete by: As directed      Allergies as of 09/03/2019   No Known Allergies     Medication List    TAKE these medications   amLODipine 5 MG tablet Commonly known as: NORVASC Take 1 tablet (5 mg total) by mouth daily.   amoxicillin-clavulanate 875-125 MG tablet Commonly known as: AUGMENTIN Take 1 tablet by mouth every 12 (twelve) hours.   aspirin 325 MG tablet Take 1 tablet (325 mg total) by mouth daily. Start taking on: September 04, 2019   atorvastatin 80 MG tablet Commonly known as: LIPITOR Take 1 tablet (80 mg total) by mouth daily at 6 PM.   clopidogrel 75 MG tablet Commonly known as: PLAVIX Take 1 tablet (75 mg total) by mouth daily. Start taking on: September 04, 2019       No Known Allergies  Consultations:     Procedures/Studies: CT HEAD WO CONTRAST  Result Date: 09/01/2019 CLINICAL DATA:  Vertigo, question left-sided weakness yesterday. EXAM: CT HEAD WITHOUT CONTRAST TECHNIQUE: Contiguous axial images were obtained from the base of the skull through the vertex without intravenous contrast. COMPARISON:  No pertinent prior studies available for comparison. FINDINGS: Brain: Mild generalized parenchymal atrophy. 2.0 x 1.3 x 1.6 cm region of abnormal hypodensity within the left cerebellar hemisphere, favored to reflect an age-indeterminate infarct (series 2, image 10) (series 4, image 51). Mild ill-defined hypoattenuation within the cerebral white matter is nonspecific, but consistent with chronic small vessel ischemic disease. There is no acute intracranial hemorrhage. No demarcated cortical infarct is identified. No extra-axial fluid  collection. No evidence of intracranial mass. No midline shift. Vascular: No hyperdense vessel.  Atherosclerotic calcifications. Skull: Normal. Negative for fracture or focal lesion. Sinuses/Orbits: Visualized orbits show no acute finding. No significant paranasal sinus disease or mastoid effusion at the imaged levels. IMPRESSION: 2 cm focus of abnormal hypodensity within the left cerebellar hemisphere. This is favored to reflect an age-indeterminate infarct, possibly acute/early subacute. Given the provided history of dizziness, consider brain MRI to assess for acuity and to exclude alternative etiologies. Background mild generalized parenchymal atrophy and cerebral white matter chronic small vessel ischemic disease. Electronically Signed   By: Jackey Loge DO   On: 09/01/2019 17:19   MR ANGIO HEAD WO CONTRAST  Result Date: 09/01/2019 CLINICAL DATA:  Left-sided weakness and dizziness. Abnormal cerebellar appearance by CT. EXAM: MRI HEAD WITHOUT CONTRAST MRA HEAD WITHOUT CONTRAST TECHNIQUE: Multiplanar, multiecho pulse sequences of the brain and surrounding structures were obtained without intravenous contrast. Angiographic images of the head were obtained using MRA technique without contrast. COMPARISON:  Head CT same day FINDINGS: MRI HEAD FINDINGS Brain: Acute infarction affecting the superior cerebellum on the left. No mass effect. No visible hemorrhage. No other acute intracranial finding. Brainstem itself appears normal. No old cerebellar insult. Cerebral hemispheres show mild chronic small-vessel change of the white matter. No hydrocephalus. 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 Vascular: Major vessels at the base of the brain show flow. Skull  and upper cervical spine: Negative Sinuses/Orbits: Clear/normal Other: None MRA HEAD FINDINGS Both internal carotid arteries are widely patent through the skull base and siphon  regions. The anterior and middle cerebral vessels are patent without proximal stenosis, aneurysm or vascular malformation. Distal vessels show atherosclerotic irregularity. Both vertebral arteries are patent to the basilar. No basilar stenosis. Mild atherosclerotic irregularity of the basilar artery. Right PICA is patent. Both anterior inferior cerebellar arteries are patent. No flow seen in a left superior cerebellar artery. 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 Distal PCA branches show atherosclerotic irregularity. IMPRESSION: Acute infarction in the left superior cerebellar artery distribution. No mass effect or hemorrhage. Mild chronic small-vessel ischemic change of the cerebral hemispheric white matter. No flow seen in the left superior cerebellar artery. Distal vessel atherosclerotic irregularity diffusely affecting the intracranial vessels. Electronically Signed   By: Paulina Fusi M.D.   On: 09/01/2019 20:45   MR BRAIN WO CONTRAST  Result Date: 09/01/2019 CLINICAL DATA:  Left-sided weakness and dizziness. Abnormal cerebellar appearance by CT. EXAM: MRI HEAD WITHOUT CONTRAST MRA HEAD WITHOUT CONTRAST TECHNIQUE: Multiplanar, multiecho pulse sequences of the brain and surrounding structures were obtained without intravenous contrast. Angiographic images of the head were obtained using MRA technique without contrast. COMPARISON:  Head CT same day FINDINGS: MRI HEAD FINDINGS Brain: Acute infarction affecting the superior cerebellum on the left. No mass effect. No visible hemorrhage. No other acute intracranial finding. Brainstem itself appears normal. No old cerebellar insult. Cerebral hemispheres show mild chronic small-vessel change of the white matter. No hydrocephalus. 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 Vascular: Major vessels at the base of the brain show flow. Skull and upper cervical spine: Negative  Sinuses/Orbits: Clear/normal Other: None MRA HEAD FINDINGS Both internal carotid arteries are widely patent through the skull base and siphon regions. The anterior and middle cerebral vessels are patent without proximal stenosis, aneurysm or vascular malformation. Distal vessels show atherosclerotic irregularity. Both vertebral arteries are patent to the basilar. No basilar stenosis. Mild atherosclerotic irregularity of the basilar artery. Right PICA is patent. Both anterior inferior cerebellar arteries are patent. No flow seen in a left superior cerebellar artery. 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 Distal PCA branches show atherosclerotic irregularity. IMPRESSION: Acute infarction in the left superior cerebellar artery distribution. No mass effect or hemorrhage. Mild chronic small-vessel ischemic change of the cerebral hemispheric white matter. No flow seen in the left superior cerebellar artery. Distal vessel atherosclerotic irregularity diffusely affecting the intracranial vessels. Electronically Signed   By: Paulina Fusi M.D.   On: 09/01/2019 20:45   US Carotid Bilateral (at [facility] and [facility] only)  Result Date: 09/02/2019 CLINICAL DATA:  74 year old male with vertigo and left-sided weakness for the past 2 days EXAM: BILATERAL CAROTID DUPLEX ULTRASOUND TECHNIQUE: Wallace Cullens scale imaging, color Doppler and duplex ultrasound were performed of bilateral carotid and vertebral arteries in the neck. COMPARISON:  None. FINDINGS: Criteria: Quantification of carotid stenosis is based on velocity parameters that correlate the residual internal carotid diameter with NASCET-based stenosis levels, using the diameter of the distal internal carotid lumen as the denominator for stenosis measurement. The following velocity measurements were obtained: RIGHT ICA: 63/19 cm/sec CCA: 79/17 cm/sec SYSTOLIC ICA/CCA RATIO:  0.8 ECA:  132 cm/sec LEFT ICA: 135/35 cm/sec CCA: 109/14 cm/sec SYSTOLIC ICA/CCA RATIO:  1.2 ECA:  172  cm/sec RIGHT CAROTID ARTERY: Heterogeneous atherosclerotic plaque in the  proximal internal carotid artery. By peak systolic velocity criteria, the estimated stenosis remains less than 50%. RIGHT VERTEBRAL ARTERY:  Patent with normal antegrade flow. LEFT CAROTID ARTERY: Moderate heterogeneous atherosclerotic plaque in the proximal internal carotid artery. By peak systolic velocity criteria, the estimated stenosis falls in the 50-69% diameter range. LEFT VERTEBRAL ARTERY:  Patent with normal antegrade flow. IMPRESSION: 1. Mild (1-49%) stenosis proximal right internal carotid artery secondary to mild heterogeneous atherosclerotic plaque. 2. 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 3. The vertebral arteries are patent with normal antegrade flow. Signed, Sterling Big, MD, RPVI [facility] Electronically Signed   By: Malachy Moan M.D.   On: 09/02/2019 11:49   ECHOCARDIOGRAM COMPLETE  Result Date: 09/03/2019    ECHOCARDIOGRAM REPORT   Patient Name:   Grant Berry Date of Exam: 09/02/2019 Medical Rec #:  [id number]          Height:       67.0 in Accession #:    [id number]         Weight:       179.0 lb Date of Birth:  [birth year]          BSA:          1.929 m Patient Age:    74 years           BP:           161/91 mmHg Patient Gender: M                  HR:           104 bpm. [facility] Procedure: 2D Echo, Cardiac Doppler and Color Doppler Indications:    Stroke 434.91 / I163.9  History:        Patient has no prior history of Echocardiogram examinations.                 Risk Factors:Hypertension and Dyslipidemia.  Sonographer:    Celesta Gentile RCS Referring Phys: [id number] DAVID MANUEL ORTIZ IMPRESSIONS  1. Left ventricular ejection fraction, by estimation, is 60 to 65%. The left ventricle has normal function. The left ventricle has no regional wall motion  abnormalities. There is mild asymmetric left ventricular hypertrophy of the basal segment. Left ventricular diastolic parameters are consistent with Grade I diastolic dysfunction (impaired relaxation).  2. Right ventricular systolic function is normal. The right ventricular size is normal.  3. The mitral valve is normal in structure. Trivial mitral valve regurgitation.  4. The aortic valve is normal in structure. Aortic valve regurgitation is not visualized. Mild aortic valve sclerosis is present, with no evidence of aortic valve stenosis.  5. Aortic Dilated sinuses. FINDINGS  Left Ventricle: Left ventricular ejection fraction, by estimation, is 60 to 65%. The left ventricle has normal function. The left ventricle has no regional wall motion abnormalities. The left ventricular internal cavity size was normal in size. There is  mild asymmetric left ventricular hypertrophy of the basal segment. Left ventricular diastolic parameters are consistent with Grade I diastolic dysfunction (impaired relaxation). Right Ventricle: The right ventricular size is normal. No increase in right ventricular wall thickness. Right ventricular systolic function is normal. Left Atrium: Left atrial size was normal in size. Right Atrium: Right atrial size was normal in size. Pericardium: There is no evidence of pericardial effusion. Mitral Valve: The mitral valve is normal in structure. There is mild thickening of the  mitral valve leaflet(s). There is mild calcification of the mitral valve leaflet(s). Mild mitral annular calcification. Trivial mitral valve regurgitation. Tricuspid Valve: The tricuspid valve is normal in structure. Tricuspid valve regurgitation is mild. Aortic Valve: The aortic valve is normal in structure. Aortic valve regurgitation is not visualized. Mild aortic valve sclerosis is present, with no evidence of aortic valve stenosis. Pulmonic Valve: The pulmonic valve was normal in structure. Pulmonic valve regurgitation is  trivial. Aorta: Dilated sinuses and the aortic arch was not well visualized. IAS/Shunts: No atrial level shunt detected by color flow Doppler.  LEFT VENTRICLE PLAX 2D LVIDd:         4.48 cm  Diastology LVIDs:         2.65 cm  LV e' lateral:   5.77 cm/s LV PW:         1.03 cm  LV E/e' lateral: 10.1 LV IVS:        0.93 cm  LV e' medial:    5.44 cm/s LVOT diam:     1.90 cm  LV E/e' medial:  10.7 LV SV:         59 LV SV Index:   30 LVOT Area:     2.84 cm  RIGHT VENTRICLE RV S prime:     18.60 cm/s TAPSE (M-mode): 2.6 cm LEFT ATRIUM             Index       RIGHT ATRIUM           Index LA diam:        3.30 cm 1.71 cm/m  RA Area:     13.10 cm LA Vol (A2C):   43.1 ml 22.34 ml/m RA Volume:   33.00 ml  17.11 ml/m LA Vol (A4C):   35.4 ml 18.35 ml/m LA Biplane Vol: 40.0 ml 20.73 ml/m  AORTIC VALVE LVOT Vmax:   120.50 cm/s LVOT Vmean:  77.700 cm/s LVOT VTI:    0.208 m  AORTA Ao Root diam: 3.60 cm MITRAL VALVE MV Area (PHT): 2.86 cm     SHUNTS MV Decel Time: 266 msec     Systemic VTI:  0.21 m MV E velocity: 58.25 cm/s   Systemic Diam: 1.90 cm MV A velocity: 120.50 cm/s MV E/A ratio:  0.48 [facility] MD Electronically signed by Charlton Haws MD Signature Date/Time: 09/03/2019/12:36:03 PM    Final        Subjective: Overall dizziness is improving.  Left-sided weakness is better.  Discharge Exam: Vitals:   09/03/19 0225 09/03/19 0634 09/03/19 1030 09/03/19 1430  BP: 121/68 113/66 (!) 171/92 (!) 154/91  Pulse: (!) 52 (!) 54 79 83  Resp: Temp: 98.9 F (37.2 C) 98.7 F (37.1 C) 98.2 F (36.8 C) 97.9 F (36.6 C)  TempSrc: Oral Oral Oral Oral  SpO2: 95% 98% 97% 96%  Weight:      Height:        General: Pt is alert, awake, not in acute distress Cardiovascular: RRR, S1/S2 +, no rubs, no gallops Respiratory: CTA bilaterally, no wheezing, no rhonchi Abdominal: Soft, NT, ND, bowel sounds + Extremities: no edema, no cyanosis    The results of significant diagnostics from this  hospitalization (including imaging, microbiology, ancillary and laboratory) are listed below for reference.     Microbiology: Recent Results (from the past 240 hour(s))  SARS Coronavirus 2 by RT PCR (hospital order, performed in [facility] hospital lab) Nasopharyngeal Nasopharyngeal Swab  Status: None   Collection Time: 09/01/19  9:52 PM   Specimen: Nasopharyngeal Swab  Result Value Ref Range Status   SARS Coronavirus 2 NEGATIVE NEGATIVE Final    Comment: (NOTE) SARS-CoV-2 target nucleic acids are NOT DETECTED.  The SARS-CoV-2 RNA is generally detectable in upper and lower respiratory specimens during the acute phase of infection. The lowest concentration of SARS-CoV-2 viral copies this assay can detect is 250 copies / mL. A negative result does not preclude SARS-CoV-2 infection and should not be used as the sole basis for treatment or other patient management decisions.  A negative result may occur with improper specimen collection / handling, submission of specimen other than nasopharyngeal swab, presence of viral mutation(s) within the areas targeted by this assay, and inadequate number of viral copies (<250 copies / mL). A negative result must be combined with clinical observations, patient history, and epidemiological information.  Fact Sheet for Patients:   BoilerBrush.com.cy  Fact Sheet for Healthcare Providers: https://pope.com/  This test is not yet approved or  cleared by the [place] FDA and has been authorized for detection and/or diagnosis of SARS-CoV-2 by FDA under an Emergency Use Authorization (EUA).  This EUA will remain in effect (meaning this test can be used) for the duration of the COVID-19 declaration under Section 564(b)(1) of the Act, 21 U.S.C. section 360bbb-3(b)(1), unless the authorization is terminated or revoked sooner.  Performed at [facility], [address] [place], [place] [address]       Labs: BNP (last 3 results) No results for input(s): BNP in the last 8760 hours. Basic Metabolic Panel: Recent Labs  Lab 09/01/19 1706 09/02/19 0536  NA 138 138  K 4.0 4.0  CL 103 103  CO2 26 25  GLUCOSE 97 92  BUN 18 17  CREATININE 1.31* 1.25*  CALCIUM 9.4 9.0  MG 2.2  --    Liver Function Tests: No results for input(s): AST, ALT, ALKPHOS, BILITOT, PROT, ALBUMIN in the last 168 hours. No results for input(s): LIPASE, AMYLASE in the last 168 hours. No results for input(s): AMMONIA in the last 168 hours. CBC: Recent Labs  Lab 09/01/19 1706  WBC 7.3  HGB 16.4  HCT 49.7  MCV 91.0  PLT 206   Cardiac Enzymes: No results for input(s): CKTOTAL, CKMB, CKMBINDEX, TROPONINI in the last 168 hours. BNP: Invalid input(s): POCBNP CBG: No results for input(s): GLUCAP in the last 168 hours. D-Dimer No results for input(s): DDIMER in the last 72 hours. Hgb A1c Recent Labs    09/02/19 0536  HGBA1C 5.6   Lipid Profile Recent Labs    09/02/19 0536  CHOL 277*  HDL 29*  LDLCALC 207*  TRIG 205*  CHOLHDL 9.6   Thyroid function studies No results for input(s): TSH, T4TOTAL, T3FREE, THYROIDAB in the last 72 hours.  Invalid input(s): FREET3 Anemia work up No results for input(s): VITAMINB12, FOLATE, FERRITIN, TIBC, IRON, RETICCTPCT in the last 72 hours. Urinalysis No results found for: COLORURINE, APPEARANCEUR, LABSPEC, PHURINE, GLUCOSEU, HGBUR, BILIRUBINUR, KETONESUR, PROTEINUR, UROBILINOGEN, NITRITE, LEUKOCYTESUR Sepsis Labs Invalid input(s): PROCALCITONIN,  WBC,  LACTICIDVEN Microbiology Recent Results (from the past 240 hour(s))  SARS Coronavirus 2 by RT PCR (hospital order, performed in [facility] hospital lab) Nasopharyngeal Nasopharyngeal Swab     Status: None   Collection Time: 09/01/19  9:52 PM   Specimen: Nasopharyngeal Swab  Result Value Ref Range Status   SARS Coronavirus 2 NEGATIVE NEGATIVE Final    Comment: (NOTE) SARS-CoV-2 target nucleic acids  are  NOT DETECTED.  The SARS-CoV-2 RNA is generally detectable in upper and lower respiratory specimens during the acute phase of infection. The lowest concentration of SARS-CoV-2 viral copies this assay can detect is 250 copies / mL. A negative result does not preclude SARS-CoV-2 infection and should not be used as the sole basis for treatment or other patient management decisions.  A negative result may occur with improper specimen collection / handling, submission of specimen other than nasopharyngeal swab, presence of viral mutation(s) within the areas targeted by this assay, and inadequate number of viral copies (<250 copies / mL). A negative result must be combined with clinical observations, patient history, and epidemiological information.  Fact Sheet for Patients:   BoilerBrush.com.cyhttps://www.fda.gov/media/136312/download  Fact Sheet for Healthcare Providers: https://pope.com/https://www.fda.gov/media/136313/download  This test is not yet approved or  cleared by the [place] FDA and has been authorized for detection and/or diagnosis of SARS-CoV-2 by FDA under an Emergency Use Authorization (EUA).  This EUA will remain in effect (meaning this test can be used) for the duration of the COVID-19 declaration under Section 564(b)(1) of the Act, 21 U.S.C. section 360bbb-3(b)(1), unless the authorization is terminated or revoked sooner.  Performed at [facility], [address] [place], [place] [address]      Time coordinating discharge: 35mins  SIGNED:   Erick BlinksJehanzeb Jawann Urbani, MD  [facility] 09/03/2019, 7:44 PM   If 7PM-7AM, please contact night-coverage [email or web address]

## 2019-09-03 NOTE — TOC Transition Note (Signed)
Transition of Care [facility]) - CM/SW Discharge Note   Patient Details  Name: Grant Berry MRN: [id number] Date of Birth: 24-May-1945  Transition of Care [facility]) CM/SW Contact:  Barry Brunner, LCSW Phone Number: 09/03/2019, 2:50 PM   Clinical Narrative:    CSW contacted Thereasa Distance with [employer] for walker for patient. Thereasa Distance agreeable to provide walker. CSW contacted Denyse Amass with [facility] for HHPT. Denyse Amass agreeable to providing services for patient. TOC signing off.    Final next level of care: Home w Home Health Services Barriers to Discharge: Barriers Resolved   Patient Goals and CMS Choice Patient states their goals for this hospitalization and ongoing recovery are:: Rehab with [facility] CMS Medicare.gov Compare Post Acute Care list provided to:: Patient Choice offered to / list presented to : Patient                 Discharge Plan and Services                DME Arranged: Dan Humphreys DME Agency: [employer] Date DME Agency Contacted: 09/03/19 Time DME Agency Contacted: [id number] Representative spoke with at DME Agency: Shelby Dubin Arranged: PT [facility] Agency: [facility] Date [facility] Agency Contacted: 09/03/19 Time HH Agency Contacted: [id number] Representative spoke with at [facility] Agency: Denyse Amass  Social Determinants of Health (SDOH) Interventions     Readmission Risk Interventions No flowsheet data found.

## 2019-10-04 ENCOUNTER — Encounter: Payer: Self-pay | Admitting: Neurology

## 2019-10-11 ENCOUNTER — Telehealth: Payer: Self-pay | Admitting: General Practice

## 2019-10-11 NOTE — Telephone Encounter (Signed)
LVM for pt to call back to see where he would like his medical record request sent to.

## 2019-10-17 ENCOUNTER — Ambulatory Visit (INDEPENDENT_AMBULATORY_CARE_PROVIDER_SITE_OTHER): Payer: Medicare Other | Admitting: Nurse Practitioner

## 2019-10-17 ENCOUNTER — Encounter: Payer: Self-pay | Admitting: Nurse Practitioner

## 2019-10-17 ENCOUNTER — Other Ambulatory Visit: Payer: Self-pay

## 2019-10-17 VITALS — BP 140/80 | HR 94 | Temp 99.0°F | Resp 20 | Ht 67.0 in | Wt 171.0 lb

## 2019-10-17 DIAGNOSIS — E785 Hyperlipidemia, unspecified: Secondary | ICD-10-CM | POA: Diagnosis not present

## 2019-10-17 DIAGNOSIS — Z0001 Encounter for general adult medical examination with abnormal findings: Secondary | ICD-10-CM | POA: Diagnosis not present

## 2019-10-17 DIAGNOSIS — I10 Essential (primary) hypertension: Secondary | ICD-10-CM | POA: Diagnosis not present

## 2019-10-17 DIAGNOSIS — Z23 Encounter for immunization: Secondary | ICD-10-CM

## 2019-10-17 DIAGNOSIS — Z7689 Persons encountering health services in other specified circumstances: Secondary | ICD-10-CM | POA: Insufficient documentation

## 2019-10-17 MED ORDER — CLOPIDOGREL BISULFATE 75 MG PO TABS: 75.0000 mg | ORAL_TABLET | Freq: Every day | ORAL | 1 refills | Status: DC

## 2019-10-17 MED ORDER — AMLODIPINE BESYLATE 5 MG PO TABS: 5.0000 mg | ORAL_TABLET | Freq: Every day | ORAL | 1 refills | Status: DC

## 2019-10-17 MED ORDER — ATORVASTATIN CALCIUM 80 MG PO TABS: 80.0000 mg | ORAL_TABLET | Freq: Every day | ORAL | 1 refills | Status: DC

## 2019-10-17 NOTE — Progress Notes (Signed)
Established Patient Office Visit  Subjective:  Patient ID: Grant Berry, male    DOB: [birth year]  Age: 74 y.o. MRN: [id number]  CC:  Chief Complaint  Patient presents with   Establish Care   Hyperlipidemia   Hypertension    HPI Grant Berry presents for .   Encounter for general adult medical examination without abnormal findings  Physical: Patient's last physical exam was 3 year ago .  Weight: Appropriate for height (BMI slightly greater than 27%) ; 26.78 kg/m Blood Pressure: Normal (BP greater than 120/80) ; 140/80 Medical History: Patient history reviewed ; Family history reviewed ;  Allergies Reviewed: No change in current allergies ;  Medications Reviewed: Medications reviewed - no changes ;  Lipids: Normal lipid levels ;  Smoking: Life-long non-smoker ;  Physical Activity: Not currently exercising Alcohol/Drug Use: Is a non-drinker ; No illicit drug use ;  Safety: reviewed ; Patient wears a seat belt, has smoke detectors, has carbon monoxide detectors, practices appropriate gun safety, and wears sunscreen with extended sun exposure. Dental Care: Annual cleanings, brushes and flosses daily. Ophthalmology/Optometry: Annual visit.  Hearing loss: Yes Vision impairments: cataract  Surgery   Pt presents for follow up of hypertension. Patient was diagnosed in 06/17/2012 The patient is tolerating the medication well without side effects. Compliance with treatment has been good; including taking medication as directed , maintains a healthy diet , and following up as directed.  Current medication Norvasc 5 mg 1 tablet daily.  Past Medical History:  Diagnosis Date   Hyperlipidemia    Hypertension    Stroke [facility])     Past Surgical History:  Procedure Laterality Date   EYE SURGERY Bilateral    TONSILECTOMY/ADENOIDECTOMY WITH MYRINGOTOMY      Family History  Problem Relation Age of Onset   Stroke Mother    Coronary artery disease Mother    Coronary  artery disease Father    Diabetes Maternal Grandfather     Social History   Socioeconomic History   Marital status: Widowed    Spouse name: Not on file   Number of children: Not on file   Years of education: Not on file   Highest education level: Not on file  Occupational History   Occupation: retired   Tobacco Use   Smoking status: Former Smoker    Quit date: 05/24/1988    Years since quitting: 31.4   Smokeless tobacco: Never Used  Vaping Use   Vaping Use: Never used  Substance and Sexual Activity   Alcohol use: No   Drug use: No   Sexual activity: Not on file  Other Topics Concern   Not on file  Social History Narrative   Live at home = son stays there    Social Determinants of Health   Financial Resource Strain:      Food Insecurity:         Transportation Needs:         Physical Activity:         Stress:      Social Connections:                     Intimate Partner Violence:                 Outpatient Medications Prior to Visit  Medication Sig Dispense Refill   amLODipine (NORVASC) 5 MG tablet Take 1 tablet (5 mg total) by mouth daily. 30 tablet 11   aspirin 325 MG tablet  Take 1 tablet (325 mg total) by mouth daily. 30 tablet 1   atorvastatin (LIPITOR) 80 MG tablet Take 1 tablet (80 mg total) by mouth daily at 6 PM. 30 tablet 0   clopidogrel (PLAVIX) 75 MG tablet Take 1 tablet (75 mg total) by mouth daily. (Patient not taking: Reported on 10/17/2019) 21 tablet 0   amoxicillin-clavulanate (AUGMENTIN) 875-125 MG tablet Take 1 tablet by mouth every 12 (twelve) hours. 10 tablet 0   No facility-administered medications prior to visit.    No Known Allergies  ROS Review of Systems  Constitutional: Negative.   HENT: Negative.   Eyes: Negative.   Respiratory: Negative.   Cardiovascular: Negative.   Gastrointestinal: Negative.   Genitourinary: Negative.   Skin: Negative.   Neurological: Positive for weakness.  Negative for facial asymmetry and light-headedness.       Recent stroke      Objective:    Physical Exam Vitals reviewed.  Constitutional:      General: He is not in acute distress.    Appearance: He is ill-appearing.  HENT:     Head: Normocephalic.     Right Ear: External ear normal. There is impacted cerumen.     Left Ear: External ear normal. There is no impacted cerumen.     Nose: Nose normal.     Mouth/Throat:     Mouth: Mucous membranes are moist.  Eyes:     Conjunctiva/sclera: Conjunctivae normal.  Cardiovascular:     Rate and Rhythm: Normal rate and regular rhythm.     Pulses: Normal pulses.     Heart sounds: Normal heart sounds.  Pulmonary:     Effort: Pulmonary effort is normal.     Breath sounds: Normal breath sounds.  Abdominal:     General: Bowel sounds are normal.  Musculoskeletal:     Cervical back: Normal range of motion and neck supple.     Comments: Front wheel walker, limited range of motion  Skin:    General: Skin is warm.  Neurological:     Mental Status: He is alert and oriented to person, place, and time.     Comments: Weakness, recent stroke     BP 140/80    Pulse 94    Temp 99 F (37.2 C)    Resp 20    Ht 5' 7"  (1.702 m)    Wt 171 lb (77.6 kg)    SpO2 98%    BMI 26.78 kg/m  Wt Readings from Last 3 Encounters:  10/17/19 171 lb (77.6 kg)  09/01/19 179 lb 0.2 oz (81.2 kg)  12/26/12 179 lb (81.2 kg)     Health Maintenance Due  Topic Date Due   COLON CANCER SCREENING ANNUAL FOBT  Never done   PNA vac Low Risk Adult (2 of 2 - PCV13) 12/26/2013   INFLUENZA VACCINE  09/24/2019    There are no preventive care reminders to display for this patient.  No results found for: TSH Lab Results  Component Value Date   WBC 7.3 09/01/2019   HGB 16.4 09/01/2019   HCT 49.7 09/01/2019   MCV 91.0 09/01/2019   PLT 206 09/01/2019   Lab Results  Component Value Date   NA 138 09/02/2019   K 4.0 09/02/2019   CO2 25 09/02/2019   GLUCOSE 92  09/02/2019   BUN 17 09/02/2019   CREATININE 1.25 (H) 09/02/2019   BILITOT 0.5 06/17/2012   ALKPHOS 47 06/17/2012   AST 22 06/17/2012   ALT 16 06/17/2012  PROT 7.0 06/17/2012   ALBUMIN 4.0 06/17/2012   CALCIUM 9.0 09/02/2019   ANIONGAP 10 09/02/2019   Lab Results  Component Value Date   CHOL 277 (H) 09/02/2019   Lab Results  Component Value Date   HDL 29 (L) 09/02/2019   Lab Results  Component Value Date   LDLCALC 207 (H) 09/02/2019   Lab Results  Component Value Date   TRIG 205 (H) 09/02/2019   Lab Results  Component Value Date   CHOLHDL 9.6 09/02/2019   Lab Results  Component Value Date   HGBA1C 5.6 09/02/2019       Assessment & Plan:  Essential hypertension, benign Patient presents for follow-up of hypertension.  Patient was diagnosed in 06/17/2012.  Patient is reporting that hypertension is well controlled with current medication which is Norvasc 5 mg 1 tablet daily.  No changes will be made to medication dose today.  Blood pressure slightly elevated in clinic, patient has not taken blood pressure medication before visit.  Patient is compliant with treatment including taking medication as directed and maintaining a healthy diet and following up as directed.  Follow-up in 3 months Refill sent to pharmacy.  Establishing care with new doctor, encounter for Patient is a 74 year old male who presents to clinic today for annual wellness visit and establishing care.  Head to toe assessment completed patient reports  recent  stroke in July.  Patient is having signs and symptoms of weakness left side greater than right.  Patient is almost back to baseline.  Patient now uses a front wheel walker.  I provided education on safety and health maintenance.  Patient educated on getting his pneumonia vaccine.,  Patient refused FOBT/Cologuard, patient has never had a colonoscopy and not interested in doing so. Printed handout given to patient. Follow-up in a year Refill  medications  sent to pharmacy Repeat lipid panel in 3 months.  Problem List Items Addressed This Visit      Cardiovascular and Mediastinum   Essential hypertension, benign - Primary (Chronic)   Relevant Orders   CBC with Differential/Platelet   CMP14+EGFR     Other   Hyperlipidemia with target LDL less than 100   Relevant Orders   CBC with Differential/Platelet   Lipid panel    Other Visit Diagnoses    Establishing care with new doctor, encounter for       Relevant Orders   CBC with Differential/Platelet      Meds ordered this encounter  Medications   clopidogrel (PLAVIX) 75 MG tablet    Sig: Take 1 tablet (75 mg total) by mouth daily.    Dispense:  90 tablet    Refill:  1   atorvastatin (LIPITOR) 80 MG tablet    Sig: Take 1 tablet (80 mg total) by mouth daily at 6 PM.    Dispense:  90 tablet    Refill:  1   amLODipine (NORVASC) 5 MG tablet    Sig: Take 1 tablet (5 mg total) by mouth daily.    Dispense:  90 tablet    Refill:  1    Follow-up: Return in about 3 months (around 01/17/2020).    Ivy Lynn, NP

## 2019-10-17 NOTE — Patient Instructions (Addendum)
Essential hypertension, benign Patient presents for follow-up of hypertension.  Patient was diagnosed in 06/17/2012.  Patient is reporting that hypertension is well controlled with current medication which is Norvasc 5 mg 1 tablet daily.  No changes will be made to medication dose today.  Blood pressure slightly elevated in clinic, patient has not taken blood pressure medication before visit.  Patient is compliant with treatment including taking medication as directed and maintaining a healthy diet and following up as directed.  Follow-up in 3 months Refill sent to pharmacy.  Establishing care with new doctor, encounter for Patient is a 74 year old male who presents to clinic today for annual wellness visit and establishing care.  Head to toe assessment completed patient reports  recent  stroke in July.  Patient is having signs and symptoms of weakness left side greater than right.  Patient is almost back to baseline.  Patient now uses a front wheel walker.  I provided education on safety and health maintenance.  Patient educated on getting his pneumonia vaccine.,  Patient refused FOBT/Cologuard, patient has never had a colonoscopy and not interested in doing so. Printed handout given to patient. Follow-up in a year Refill medications  sent to pharmacy Repeat lipid panel in 3 months.    Cholesterol Content in Foods Cholesterol is a waxy, fat-like substance that helps to carry fat in the blood. The body needs cholesterol in small amounts, but too much cholesterol can cause damage to the arteries and heart. Most people should eat less than 200 milligrams (mg) of cholesterol a day. Foods with cholesterol  Cholesterol is found in animal-based foods, such as meat, seafood, and dairy. Generally, low-fat dairy and lean meats have less cholesterol than full-fat dairy and fatty meats. The milligrams of cholesterol per serving (mg per serving) of common cholesterol-containing foods are listed below. Meat  and other proteins  Egg -- one large whole egg has 186 mg.  Veal shank -- 4 oz has 141 mg.  Lean ground [place] (93% lean) -- 4 oz has 118 mg.  Fat-trimmed lamb loin -- 4 oz has 106 mg.  Lean ground beef (90% lean) -- 4 oz has 100 mg.  Lobster -- 3.5 oz has 90 mg.  Pork loin chops -- 4 oz has 86 mg.  Canned salmon -- 3.5 oz has 83 mg.  Fat-trimmed beef top loin -- 4 oz has 78 mg.  Frankfurter -- 1 frank (3.5 oz) has 77 mg.  Crab -- 3.5 oz has 71 mg.  Roasted chicken without skin, white meat -- 4 oz has 66 mg.  Light bologna -- 2 oz has 45 mg.  Deli-cut [place] -- 2 oz has 31 mg.  Canned tuna -- 3.5 oz has 31 mg.  Tomasa Blase -- 1 oz has 29 mg.  Oysters and mussels (raw) -- 3.5 oz has 25 mg.  Mackerel -- 1 oz has 22 mg.  Trout -- 1 oz has 20 mg.  Pork sausage -- 1 link (1 oz) has 17 mg.  Salmon -- 1 oz has 16 mg.  Tilapia -- 1 oz has 14 mg. Dairy  Soft-serve ice cream --  cup (4 oz) has 103 mg.  Whole-milk yogurt -- 1 cup (8 oz) has 29 mg.  Cheddar cheese -- 1 oz has 28 mg.  American cheese -- 1 oz has 28 mg.  Whole milk -- 1 cup (8 oz) has 23 mg.  2% milk -- 1 cup (8 oz) has 18 mg.  Cream cheese -- 1 tablespoon (Tbsp)  has 15 mg.  Cottage cheese --  cup (4 oz) has 14 mg.  Low-fat (1%) milk -- 1 cup (8 oz) has 10 mg.  Sour cream -- 1 Tbsp has 8.5 mg.  Low-fat yogurt -- 1 cup (8 oz) has 8 mg.  Nonfat Greek yogurt -- 1 cup (8 oz) has 7 mg.  Half-and-half cream -- 1 Tbsp has 5 mg. Fats and oils  Cod liver oil -- 1 tablespoon (Tbsp) has 82 mg.  Butter -- 1 Tbsp has 15 mg.  Lard -- 1 Tbsp has 14 mg.  Bacon grease -- 1 Tbsp has 14 mg.  Mayonnaise -- 1 Tbsp has 5-10 mg.  Margarine -- 1 Tbsp has 3-10 mg. Exact amounts of cholesterol in these foods may vary depending on specific ingredients and brands. Foods without cholesterol Most plant-based foods do not have cholesterol unless you combine them with a food that has cholesterol. Foods without  cholesterol include:  Grains and cereals.  Vegetables.  Fruits.  Vegetable oils, such as olive, canola, and sunflower oil.  Legumes, such as peas, beans, and lentils.  Nuts and seeds.  Egg whites. Summary  The body needs cholesterol in small amounts, but too much cholesterol can cause damage to the arteries and heart.  Most people should eat less than 200 milligrams (mg) of cholesterol a day. This information is not intended to replace advice given to you by your health care provider. Make sure you discuss any questions you have with your health care provider. Document Revised: 01/22/2017 Document Reviewed: 10/06/2016 Elsevier Patient Education  2020 [employer]. Health Maintenance After Age 86 After age 67, you are at a higher risk for certain long-term diseases and infections as well as injuries from falls. Falls are a major cause of broken bones and head injuries in people who are older than age 53. Getting regular preventive care can help to keep you healthy and well. Preventive care includes getting regular testing and making lifestyle changes as recommended by your health care provider. Talk with your health care provider about:  Which screenings and tests you should have. A screening is a test that checks for a disease when you have no symptoms.  A diet and exercise plan that is right for you. What should I know about screenings and tests to prevent falls? Screening and testing are the best ways to find a health problem early. Early diagnosis and treatment give you the best chance of managing medical conditions that are common after age 61. Certain conditions and lifestyle choices may make you more likely to have a fall. Your health care provider may recommend:  Regular vision checks. Poor vision and conditions such as cataracts can make you more likely to have a fall. If you wear glasses, make sure to get your prescription updated if your vision changes.  Medicine review.  Work with your health care provider to regularly review all of the medicines you are taking, including over-the-counter medicines. Ask your health care provider about any side effects that may make you more likely to have a fall. Tell your health care provider if any medicines that you take make you feel dizzy or sleepy.  Osteoporosis screening. Osteoporosis is a condition that causes the bones to get weaker. This can make the bones weak and cause them to break more easily.  Blood pressure screening. Blood pressure changes and medicines to control blood pressure can make you feel dizzy.  Strength and balance checks. Your health care provider may recommend  certain tests to check your strength and balance while standing, walking, or changing positions.  Foot health exam. Foot pain and numbness, as well as not wearing proper footwear, can make you more likely to have a fall.  Depression screening. You may be more likely to have a fall if you have a fear of falling, feel emotionally low, or feel unable to do activities that you used to do.  Alcohol use screening. Using too much alcohol can affect your balance and may make you more likely to have a fall. What actions can I take to lower my risk of falls? General instructions  Talk with your health care provider about your risks for falling. Tell your health care provider if: ? You fall. Be sure to tell your health care provider about all falls, even ones that seem minor. ? You feel dizzy, sleepy, or off-balance.  Take over-the-counter and prescription medicines only as told by your health care provider. These include any supplements.  Eat a healthy diet and maintain a healthy weight. A healthy diet includes low-fat dairy products, low-fat (lean) meats, and fiber from whole grains, beans, and lots of fruits and vegetables. Home safety  Remove any tripping hazards, such as rugs, cords, and clutter.  Install safety equipment such as grab bars in  bathrooms and safety rails on stairs.  Keep rooms and walkways well-lit. Activity   Follow a regular exercise program to stay fit. This will help you maintain your balance. Ask your health care provider what types of exercise are appropriate for you.  If you need a cane or walker, use it as recommended by your health care provider.  Wear supportive shoes that have nonskid soles. Lifestyle  Do not drink alcohol if your health care provider tells you not to drink.  If you drink alcohol, limit how much you have: ? 0-1 drink a day for women. ? 0-2 drinks a day for men.  Be aware of how much alcohol is in your drink. In the [place] one drink equals one typical bottle of beer (12 oz), one-half glass of wine (5 oz), or one shot of hard liquor (1 oz).  Do not use any products that contain nicotine or tobacco, such as cigarettes and e-cigarettes. If you need help quitting, ask your health care provider. Summary  Having a healthy lifestyle and getting preventive care can help to protect your health and wellness after age 75.  Screening and testing are the best way to find a health problem early and help you avoid having a fall. Early diagnosis and treatment give you the best chance for managing medical conditions that are more common for people who are older than age 52.  Falls are a major cause of broken bones and head injuries in people who are older than age 58. Take precautions to prevent a fall at home.  Work with your health care provider to learn what changes you can make to improve your health and wellness and to prevent falls. This information is not intended to replace advice given to you by your health care provider. Make sure you discuss any questions you have with your health care provider. Document Revised: 06/02/2018 Document Reviewed: 12/23/2016 Elsevier Patient Education  2020 [employer].

## 2019-10-17 NOTE — Assessment & Plan Note (Signed)
Patient presents for follow-up of hypertension.  Patient was diagnosed in 06/17/2012.  Patient is reporting that hypertension is well controlled with current medication which is Norvasc 5 mg 1 tablet daily.  No changes will be made to medication dose today.  Blood pressure slightly elevated in clinic, patient has not taken blood pressure medication before visit.  Patient is compliant with treatment including taking medication as directed and maintaining a healthy diet and following up as directed.  Follow-up in 3 months Refill sent to pharmacy.

## 2019-10-17 NOTE — Assessment & Plan Note (Signed)
Patient is a 74 year old male who presents to clinic today for annual wellness visit and establishing care.  Head to toe assessment completed patient reports  recent  stroke in July.  Patient is having signs and symptoms of weakness left side greater than right.  Patient is almost back to baseline.  Patient now uses a front wheel walker.  I provided education on safety and health maintenance.  Patient educated on getting his pneumonia vaccine.,  Patient refused FOBT/Cologuard, patient has never had a colonoscopy and not interested in doing so. Printed handout given to patient. Follow-up in a year Refill medications  sent to pharmacy Repeat lipid panel in 3 months.

## 2019-10-19 ENCOUNTER — Ambulatory Visit: Payer: Medicare Other | Admitting: Neurology

## 2019-11-21 ENCOUNTER — Encounter: Payer: Self-pay | Admitting: *Deleted

## 2019-11-30 ENCOUNTER — Encounter: Payer: Self-pay | Admitting: Neurology

## 2019-11-30 ENCOUNTER — Other Ambulatory Visit: Payer: Self-pay

## 2019-11-30 ENCOUNTER — Ambulatory Visit: Payer: Medicare Other | Admitting: Neurology

## 2019-11-30 VITALS — BP 155/99 | HR 64 | Ht 67.0 in | Wt 164.0 lb

## 2019-11-30 DIAGNOSIS — R270 Ataxia, unspecified: Secondary | ICD-10-CM

## 2019-11-30 DIAGNOSIS — I639 Cerebral infarction, unspecified: Secondary | ICD-10-CM

## 2019-11-30 NOTE — Progress Notes (Signed)
[facility] [address] [place]. [place] [address] [phone]       OFFICE CONSULT NOTE  [name] Date of Birth:  Nov 06, 1945 Medical Record Number:  [id number]   Referring MD: Dagmar Hait Reason for Referral: Stroke HPI: Grant Berry is a 74-year Caucasian male seen today for initial office consultation visit for stroke.  He is accompanied by his son.  History is obtained from them and review of electronic medical records.  I personally reviewed available imaging films in PACS.  He has past medical history significant for hypertension hyperlipidemia who presented to [facility] emergency room on 09/03/2019 with sudden onset of dizziness, gait ataxia and leaning to the left upon arising from sleep.  He denied accompanying nausea vomiting double vision slurred speech, numbness or focal weakness.  CT scan of the head on admission showed a 2 cm hypodensity in the left superior cerebellum.  This was subsequently confirmed to be acute infarct on the MRI scan MRA of the brain showed occlusion of the left superior cerebellar artery.  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  2D echo showed normal ejection fraction without cardiac source of embolism.  Telemetry monitoring did not show any significant cardiac arrhythmias carotid ultrasound showed mild right ICA and moderate 50 to 69% left ICA stenosis.  LDL cholesterol was greater than 200 and patient was started on Lipitor 80 mg.  Hemoglobin A1c was 5.6.  Patient was started on aspirin and Plavix.  He was discharged home with outpatient therapy however the patient stated the therapist visited only once.  He has noted improvement in left-sided strength and coordination though he still slightly off balance and is walking with a walker.  He is careful and said no falls.  He is tolerating aspirin and Plavix well without bruising or bleeding.  Tolerating Lipitor well without muscle aches and pains.  His blood pressures well  controlled.  He has been seen by his primary care physician once since discharge.  He has no new complaints today.  He has no prior history of strokes TIAs seizures or significant neurological problems.  He denies any prior history of atrial fibrillation, palpitations, syncope, myocardial infarction or significant cardiac disease.  ROS:   14 system review of systems is positive for dizziness, incoordination, gait ataxia all other systems negative PMH:  Past Medical History:  Diagnosis Date  . Hyperlipidemia   . Hypertension   . Stroke [facility])    Left sided weakness 7/21    Social History:  Social History   Socioeconomic History  . Marital status: Widowed    Spouse name: Not on file  . Number of children: 1  . Years of education: Not on file  . Highest education level: Not on file  Occupational History  . Occupation: retired   Tobacco Use  . Smoking status: Former Smoker    Quit date: 05/24/1988    Years since quitting: 31.5  . Smokeless tobacco: Never Used  Vaping Use  . Vaping Use: Never used  Substance and Sexual Activity  . Alcohol use: No  . Drug use: No  . Sexual activity: Not on file  Other Topics Concern  . Not on file  Social History Narrative   Live at home = son stays there    Right Handed   Drinks 1-2 cups of caffeine/daily   Social Determinants of Health   Financial Resource Strain:   . Difficulty of Paying Living Expenses: Not on file  Food Insecurity:   .  Worried About Programme researcher, broadcasting/film/video in the Last Year: Not on file  . Ran Out of Food in the Last Year: Not on file  Transportation Needs:   . Lack of Transportation (Medical): Not on file  . Lack of Transportation (Non-Medical): Not on file  Physical Activity:   . Days of Exercise per Week: Not on file  . Minutes of Exercise per Session: Not on file  Stress:   . Feeling of Stress : Not on file  Social Connections:   . Frequency of Communication with Friends and Family: Not on file  . Frequency of  Social Gatherings with Friends and Family: Not on file  . Attends Religious Services: Not on file  . Active Member of Clubs or Organizations: Not on file  . Attends Banker Meetings: Not on file  . Marital Status: Not on file  Intimate Partner Violence:   . Fear of Current or Ex-Partner: Not on file  . Emotionally Abused: Not on file  . Physically Abused: Not on file  . Sexually Abused: Not on file    Medications:   Current Outpatient Medications on File Prior to Visit  Medication Sig Dispense Refill  . amLODipine (NORVASC) 5 MG tablet Take 1 tablet (5 mg total) by mouth daily. 90 tablet 1  . aspirin 325 MG tablet Take 1 tablet (325 mg total) by mouth daily. 30 tablet 1  . atorvastatin (LIPITOR) 80 MG tablet Take 1 tablet (80 mg total) by mouth daily at 6 PM. 90 tablet 1  . clopidogrel (PLAVIX) 75 MG tablet Take 1 tablet (75 mg total) by mouth daily. 90 tablet 1   No current facility-administered medications on file prior to visit.    Allergies:  No Known Allergies  Physical Exam General: Frail elderly Caucasian male seated, in no evident distress Head: head normocephalic and atraumatic.   Neck: supple with no carotid or supraclavicular bruits Cardiovascular: regular rate and rhythm, no murmurs Musculoskeletal: no deformity Skin:  no rash/petichiae Vascular:  Normal pulses all extremities  Neurologic Exam Mental Status: Awake and fully alert. Oriented to place and time. Recent and remote memory intact. Attention span, concentration and fund of knowledge appropriate. Mood and affect appropriate.  Cranial Nerves: Fundoscopic exam reveals sharp disc margins. Pupils equal, briskly reactive to light. Extraocular movements full without nystagmus. Visual fields full to confrontation. Hearing intact. Facial sensation intact. Face, tongue, palate moves normally and symmetrically.  Motor: Normal bulk and tone. Normal strength in all tested extremity muscles.  Diminished fine  finger movements on the left.  Orbits right over left upper extremity.  Tone is increased slightly in the left leg. Sensory.: intact to touch , pinprick , position and vibratory sensation.  Coordination: Mild finger-to-nose and needle cause incoordination on the left Gait and Station: Arises from chair without difficulty. Stance is broad-based.  Gait is mildly ataxic with dragging of the left leg.  Unable to do tandem walking.  Reflexes: 1+ and symmetric. Toes downgoing.   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  Vascular risk factors of hypertension, hyperlipidemia and age.     PLAN: I had a long d/w patient and his son about his recent cerebellar stroke, risk for recurrent stroke/TIAs, personally independently reviewed imaging studies and stroke evaluation results and answered questions.Continue aspirin 81 mg daily  for secondary stroke prevention and discontinue Plavix now since it has been more than  2 months since dual antiplatelet therapy was started and maintain strict control of hypertension with blood pressure goal below 130/90, diabetes with hemoglobin A1c goal below 6.5% and lipids with LDL cholesterol goal below 70 mg/dL. I also advised the patient to eat a healthy diet with plenty of whole grains, cereals, fruits and vegetables, exercise regularly and maintain ideal body weight .check follow-up lipid profile today and if LDL is not optimal may need to consider starting PCSK9 inhibitor injections.  Refer to electrophysiology for loop recorder insertion for paroxysmal A. fib.Marland Kitchen  Referral for outpatient physical occupational therapy for gait and balance training.  The patient may consider possible participation in the [place] trial for stroke prevention if interested.  He and his son were given information to review and decide.  Greater than 50% time during this 45-minute consultation visit  was spent on counseling and coordination of care about his cryptogenic stroke and discussion about stroke prevention and treatment followup in the future with my nurse practitioner Shanda Bumps in 3 months or call earlier if necessary. Delia Heady, MD Note: This document was prepared with digital dictation and possible smart phrase technology. Any transcriptional errors that result from this process are unintentional.

## 2019-11-30 NOTE — Patient Instructions (Signed)
I had a long d/w patient and his son about his recent cerebellar stroke, risk for recurrent stroke/TIAs, personally independently reviewed imaging studies and stroke evaluation results and answered questions.Continue aspirin 81 mg daily  for secondary stroke prevention and discontinue Plavix now since it has been more than 2 months since dual antiplatelet therapy was started and maintain strict control of hypertension with blood pressure goal below 130/90, diabetes with hemoglobin A1c goal below 6.5% and lipids with LDL cholesterol goal below 70 mg/dL. I also advised the patient to eat a healthy diet with plenty of whole grains, cereals, fruits and vegetables, exercise regularly and maintain ideal body weight .check follow-up lipid profile today and if LDL is not optimal may need to consider starting PCSK9 inhibitor injections.  Refer to electrophysiology for loop recorder insertion for paroxysmal A. fib.Marland Kitchen  Referral for outpatient physical occupational therapy for gait and balance training.  Followup in the future with my nurse practitioner Shanda Bumps in 3 months or call earlier if necessary.  Stroke Prevention Some medical conditions and behaviors are associated with a higher chance of having a stroke. You can help prevent a stroke by making nutrition, lifestyle, and other changes, including managing any medical conditions you may have. What nutrition changes can be made?   Eat healthy foods. You can do this by: ? Choosing foods high in fiber, such as fresh fruits and vegetables and whole grains. ? Eating at least 5 or more servings of fruits and vegetables a day. Try to fill half of your plate at each meal with fruits and vegetables. ? Choosing lean protein foods, such as lean cuts of meat, poultry without skin, fish, tofu, beans, and nuts. ? Eating low-fat dairy products. ? Avoiding foods that are high in salt (sodium). This can help lower blood pressure. ? Avoiding foods that have saturated fat, trans  fat, and cholesterol. This can help prevent high cholesterol. ? Avoiding processed and premade foods.  Follow your health care provider's specific guidelines for losing weight, controlling high blood pressure (hypertension), lowering high cholesterol, and managing diabetes. These may include: ? Reducing your daily calorie intake. ? Limiting your daily sodium intake to 1,500 milligrams (mg). ? Using only healthy fats for cooking, such as olive oil, canola oil, or sunflower oil. ? Counting your daily carbohydrate intake. What lifestyle changes can be made?  Maintain a healthy weight. Talk to your health care provider about your ideal weight.  Get at least 30 minutes of moderate physical activity at least 5 days a week. Moderate activity includes brisk walking, biking, and swimming.  Do not use any products that contain nicotine or tobacco, such as cigarettes and e-cigarettes. If you need help quitting, ask your health care provider. It may also be helpful to avoid exposure to secondhand smoke.  Limit alcohol intake to no more than 1 drink a day for nonpregnant women and 2 drinks a day for men. One drink equals 12 oz of beer, 5 oz of wine, or 1 oz of hard liquor.  Stop any illegal drug use.  Avoid taking birth control pills. Talk to your health care provider about the risks of taking birth control pills if: ? You are over 39 years old. ? You smoke. ? You get migraines. ? You have ever had a blood clot. What other changes can be made?  Manage your cholesterol levels. ? Eating a healthy diet is important for preventing high cholesterol. If cholesterol cannot be managed through diet alone, you may also need  to take medicines. ? Take any prescribed medicines to control your cholesterol as told by your health care provider.  Manage your diabetes. ? Eating a healthy diet and exercising regularly are important parts of managing your blood sugar. If your blood sugar cannot be managed through  diet and exercise, you may need to take medicines. ? Take any prescribed medicines to control your diabetes as told by your health care provider.  Control your hypertension. ? To reduce your risk of stroke, try to keep your blood pressure below 130/80. ? Eating a healthy diet and exercising regularly are an important part of controlling your blood pressure. If your blood pressure cannot be managed through diet and exercise, you may need to take medicines. ? Take any prescribed medicines to control hypertension as told by your health care provider. ? Ask your health care provider if you should monitor your blood pressure at home. ? Have your blood pressure checked every year, even if your blood pressure is normal. Blood pressure increases with age and some medical conditions.  Get evaluated for sleep disorders (sleep apnea). Talk to your health care provider about getting a sleep evaluation if you snore a lot or have excessive sleepiness.  Take over-the-counter and prescription medicines only as told by your health care provider. Aspirin or blood thinners (antiplatelets or anticoagulants) may be recommended to reduce your risk of forming blood clots that can lead to stroke.  Make sure that any other medical conditions you have, such as atrial fibrillation or atherosclerosis, are managed. What are the warning signs of a stroke? The warning signs of a stroke can be easily remembered as BEFAST.  B is for balance. Signs include: ? Dizziness. ? Loss of balance or coordination. ? Sudden trouble walking.  E is for eyes. Signs include: ? A sudden change in vision. ? Trouble seeing.  F is for face. Signs include: ? Sudden weakness or numbness of the face. ? The face or eyelid drooping to one side.  A is for arms. Signs include: ? Sudden weakness or numbness of the arm, usually on one side of the body.  S is for speech. Signs include: ? Trouble speaking (aphasia). ? Trouble  understanding.  T is for time. ? These symptoms may represent a serious problem that is an emergency. Do not wait to see if the symptoms will go away. Get medical help right away. Call your local emergency services (911 in the [place] Do not drive yourself to the hospital.  Other signs of stroke may include: ? A sudden, severe headache with no known cause. ? Nausea or vomiting. ? Seizure. Where to find more information For more information, visit:  American Stroke Association: www.strokeassociation.org  National Stroke Association: www.stroke.org Summary  You can prevent a stroke by eating healthy, exercising, not smoking, limiting alcohol intake, and managing any medical conditions you may have.  Do not use any products that contain nicotine or tobacco, such as cigarettes and e-cigarettes. If you need help quitting, ask your health care provider. It may also be helpful to avoid exposure to secondhand smoke.  Remember BEFAST for warning signs of stroke. Get help right away if you or a loved one has any of these signs. This information is not intended to replace advice given to you by your health care provider. Make sure you discuss any questions you have with your health care provider. Document Revised: 01/22/2017 Document Reviewed: 03/17/2016 Elsevier Patient Education  2020 [employer].

## 2019-12-07 ENCOUNTER — Encounter: Payer: Self-pay | Admitting: Internal Medicine

## 2020-01-19 ENCOUNTER — Ambulatory Visit: Payer: Medicare Other | Admitting: Nurse Practitioner

## 2020-01-22 ENCOUNTER — Ambulatory Visit (INDEPENDENT_AMBULATORY_CARE_PROVIDER_SITE_OTHER): Payer: Medicare Other | Admitting: Nurse Practitioner

## 2020-01-22 ENCOUNTER — Encounter: Payer: Self-pay | Admitting: Nurse Practitioner

## 2020-01-22 ENCOUNTER — Other Ambulatory Visit: Payer: Self-pay

## 2020-01-22 VITALS — BP 135/67 | HR 77 | Temp 97.6°F | Ht 67.0 in | Wt 156.4 lb

## 2020-01-22 DIAGNOSIS — E785 Hyperlipidemia, unspecified: Secondary | ICD-10-CM | POA: Diagnosis not present

## 2020-01-22 DIAGNOSIS — I639 Cerebral infarction, unspecified: Secondary | ICD-10-CM

## 2020-01-22 DIAGNOSIS — I10 Essential (primary) hypertension: Secondary | ICD-10-CM | POA: Diagnosis not present

## 2020-01-22 DIAGNOSIS — Z23 Encounter for immunization: Secondary | ICD-10-CM | POA: Insufficient documentation

## 2020-01-22 LAB — CBC WITH DIFFERENTIAL/PLATELET
Basophils Absolute: 0.1 10*3/uL (ref 0.0–0.2)
Basos: 1 %
EOS (ABSOLUTE): 0.2 10*3/uL (ref 0.0–0.4)
Eos: 3 %
Hematocrit: 43.8 % (ref 37.5–51.0)
Hemoglobin: 14.8 g/dL (ref 13.0–17.7)
Immature Grans (Abs): 0 10*3/uL (ref 0.0–0.1)
Immature Granulocytes: 0 %
Lymphocytes Absolute: 2.1 10*3/uL (ref 0.7–3.1)
Lymphs: 32 %
MCH: 29.5 pg (ref 26.6–33.0)
MCHC: 33.8 g/dL (ref 31.5–35.7)
MCV: 87 fL (ref 79–97)
Monocytes Absolute: 0.6 10*3/uL (ref 0.1–0.9)
Monocytes: 9 %
Neutrophils Absolute: 3.7 10*3/uL (ref 1.4–7.0)
Neutrophils: 55 %
Platelets: 219 10*3/uL (ref 150–450)
RBC: 5.02 x10E6/uL (ref 4.14–5.80)
RDW: 14.7 % (ref 11.6–15.4)
WBC: 6.7 10*3/uL (ref 3.4–10.8)

## 2020-01-22 MED ORDER — B COMPLEX-C PO TABS: 1.0000 | ORAL_TABLET | Freq: Every day | ORAL | 1 refills | Status: DC

## 2020-01-22 MED ORDER — ATORVASTATIN CALCIUM 80 MG PO TABS: 80.0000 mg | ORAL_TABLET | Freq: Every day | ORAL | 1 refills | Status: DC

## 2020-01-22 MED ORDER — ASPIRIN 325 MG PO TABS: 325.0000 mg | ORAL_TABLET | Freq: Every day | ORAL | 1 refills | Status: DC

## 2020-01-22 MED ORDER — AMLODIPINE BESYLATE 5 MG PO TABS: 5.0000 mg | ORAL_TABLET | Freq: Every day | ORAL | 1 refills | Status: DC

## 2020-01-22 NOTE — Assessment & Plan Note (Signed)
Patient is gradually regaining strength.  Patient continues to use a walker.  Continues to report dizziness and ringing in the ear but denies any headache, currently on aspirin 325 mg daily.  Plavix 75 mg discontinued.

## 2020-01-22 NOTE — Assessment & Plan Note (Signed)
Well-controlled on atorvastatin 80 mg tablet daily.  No changes necessary completed lipid panel.  Provided education to patient with printed handouts given.  Follow-up in 3 months.

## 2020-01-22 NOTE — Progress Notes (Signed)
Established Patient Office Visit  Subjective:  Patient ID: Grant Berry, male    DOB: 03-05-1945  Age: 74 y.o. MRN: [id number]  CC:  Chief Complaint  Patient presents with   Medical Management of Chronic Issues    HPI Grant Berry presents for Mixed hyperlipidemia. Patient was diagnosed in .  06/17/2012.  Compliance with treatment has been good; the patient is compliant with medications, maintains a low cholesterol diet , follows up as directed , and maintains an exercise regimen . The patient denies experiencing any hypercholesterolemia related symptoms.  Atorvastatin 80 mg tablet daily.   Pt presents for follow up of hypertension. Patient was diagnosed in 06/17/2012.  The patient is tolerating the medication well without side effects. Compliance with treatment has been good; including taking medication as directed , maintains a healthy diet and regular exercise regimen , and following up as directed.  Current medication Norvasc 5 mg tablet by mouth daily.         Past Medical History:  Diagnosis Date   Hyperlipidemia    Hypertension    Stroke [facility] [place])    Left sided weakness 7/21    Past Surgical History:  Procedure Laterality Date   EYE SURGERY Bilateral    TONSILECTOMY/ADENOIDECTOMY WITH MYRINGOTOMY      Family History  Problem Relation Age of Onset   Stroke Mother    Coronary artery disease Mother    Coronary artery disease Father    Diabetes Maternal Grandfather     Social History   Socioeconomic History   Marital status: Widowed    Spouse name: Not on file   Number of children: 1   Years of education: Not on file   Highest education level: Not on file  Occupational History   Occupation: retired   Tobacco Use   Smoking status: Former Smoker    Quit date: 05/24/1988    Years since quitting: 31.6   Smokeless tobacco: Never Used  Vaping Use   Vaping Use: Never used  Substance and Sexual Activity   Alcohol use: No   Drug use:  No   Sexual activity: Not on file  Other Topics Concern   Not on file  Social History Narrative   Live at home = son stays there    Right Handed   Drinks 1-2 cups of caffeine/daily   Social Determinants of Health   Financial Resource Strain:    Difficulty of Paying Living Expenses: Not on file  Food Insecurity:    Worried About Programme researcher, broadcasting/film/video in the Last Year: Not on file   [employer] of Food in the Last Year: Not on file  Transportation Needs:    Lack of Transportation (Medical): Not on file   Lack of Transportation (Non-Medical): Not on file  Physical Activity:    Days of Exercise per Week: Not on file   Minutes of Exercise per Session: Not on file  Stress:    Feeling of Stress : Not on file  Social Connections:    Frequency of Communication with Friends and Family: Not on file   Frequency of Social Gatherings with Friends and Family: Not on file   Attends Religious Services: Not on file   Active Member of Clubs or Organizations: Not on file   Attends Banker Meetings: Not on file   Marital Status: Not on file  Intimate Partner Violence:    Fear of Current or Ex-Partner: Not on file   Emotionally Abused: Not on  file   Physically Abused: Not on file   Sexually Abused: Not on file    Outpatient Medications Prior to Visit  Medication Sig Dispense Refill   amLODipine (NORVASC) 5 MG tablet Take 1 tablet (5 mg total) by mouth daily. 90 tablet 1   aspirin 325 MG tablet Take 1 tablet (325 mg total) by mouth daily. 30 tablet 1   atorvastatin (LIPITOR) 80 MG tablet Take 1 tablet (80 mg total) by mouth daily at 6 PM. 90 tablet 1   clopidogrel (PLAVIX) 75 MG tablet Take 1 tablet (75 mg total) by mouth daily. 90 tablet 1   No facility-administered medications prior to visit.    No Known Allergies  ROS Review of Systems  Neurological: Positive for dizziness and weakness. Negative for light-headedness, numbness and headaches.  All other  systems reviewed and are negative.     Objective:    Physical Exam Vitals reviewed. Exam conducted with a chaperone present.  Constitutional:      Appearance: Normal appearance. He is normal weight.  HENT:     Head: Normocephalic.     Ears:     Comments: Ringing in left ear    Nose: Nose normal.  Cardiovascular:     Rate and Rhythm: Normal rate and regular rhythm.     Pulses: Normal pulses.     Heart sounds: Normal heart sounds.  Pulmonary:     Effort: Pulmonary effort is normal.  Abdominal:     General: Bowel sounds are normal.  Musculoskeletal:     Comments: Patient uses a walker  Skin:    General: Skin is dry.     Findings: No erythema or rash.  Neurological:     Mental Status: He is alert and oriented to person, place, and time.  Psychiatric:        Mood and Affect: Mood normal.        Behavior: Behavior normal.     BP 135/67    Pulse 77    Temp 97.6 F (36.4 C)    Ht 5\' 7"  (1.702 m)    Wt 156 lb 6.4 oz (70.9 kg)    SpO2 100%    BMI 24.50 kg/m  Wt Readings from Last 3 Encounters:  11/30/19 164 lb (74.4 kg)  10/17/19 171 lb (77.6 kg)  09/01/19 179 lb 0.2 oz (81.2 kg)     Health Maintenance Due  Topic Date Due   COVID-19 Vaccine (1) Never done   COLON CANCER SCREENING ANNUAL FOBT  Never done    There are no preventive care reminders to display for this patient.  No results found for: TSH Lab Results  Component Value Date   WBC 7.3 09/01/2019   HGB 16.4 09/01/2019   HCT 49.7 09/01/2019   MCV 91.0 09/01/2019   PLT 206 09/01/2019   Lab Results  Component Value Date   NA 138 09/02/2019   K 4.0 09/02/2019   CO2 25 09/02/2019   GLUCOSE 92 09/02/2019   BUN 17 09/02/2019   CREATININE 1.25 (H) 09/02/2019   BILITOT 0.5 06/17/2012   ALKPHOS 47 06/17/2012   AST 22 06/17/2012   ALT 16 06/17/2012   PROT 7.0 06/17/2012   ALBUMIN 4.0 06/17/2012   CALCIUM 9.0 09/02/2019   ANIONGAP 10 09/02/2019   Lab Results  Component Value Date   CHOL 277 (H)  09/02/2019   Lab Results  Component Value Date   HDL 29 (L) 09/02/2019   Lab Results  Component Value Date  LDLCALC 207 (H) 09/02/2019   Lab Results  Component Value Date   TRIG 205 (H) 09/02/2019   Lab Results  Component Value Date   CHOLHDL 9.6 09/02/2019   Lab Results  Component Value Date   HGBA1C 5.6 09/02/2019    Assessment & Plan:  Hyperlipidemia with target LDL less than 100 Well-controlled on atorvastatin 80 mg tablet daily.  No changes necessary completed lipid panel.  Provided education to patient with printed handouts given.  Follow-up in 3 months.  Essential hypertension, benign Essential hypertension well-controlled on amlodipine 5 mg tablet by mouth daily.  No changes to medication necessary.  Patient is not reporting any side effect from current medication, no headaches.  Dizziness present and ringing in left ear which is not new for patient.  Patient reports symptoms present prior to stroke.  Continue to provide education to patient with printed handouts given. Rx refill sent to pharmacy.  Follow-up in 3 months.   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  Patient continues to use a walker.  Continues to report dizziness and ringing in the ear but denies any headache, currently on aspirin 325 mg daily.  Plavix 75 mg discontinued.  Problem List Items Addressed This Visit      Cardiovascular and Mediastinum   Essential hypertension, benign - Primary (Chronic)    Essential hypertension well-controlled on amlodipine 5 mg tablet by mouth daily.  No changes to medication necessary.  Patient is not reporting any side effect from current medication, no headaches.  Dizziness present and ringing in left ear which is not new for patient.  Patient reports symptoms present prior to stroke.  Continue to provide education to patient with printed handouts given. Rx refill sent to pharmacy.  Follow-up in 3 months.         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  Patient continues to use a walker.  Continues to report dizziness and ringing in the ear but denies any headache, currently on aspirin 325 mg daily.  Plavix 75 mg discontinued.      Relevant Medications   amLODipine (NORVASC) 5 MG tablet   aspirin 325 MG tablet   atorvastatin (LIPITOR) 80 MG tablet     Other   Hyperlipidemia with target LDL less than 100    Well-controlled on atorvastatin 80 mg tablet daily.  No changes necessary completed lipid panel.  Provided education to patient with printed handouts given.  Follow-up in 3 months.      Relevant Medications   amLODipine (NORVASC) 5 MG tablet   aspirin 325 MG tablet   atorvastatin (LIPITOR) 80 MG tablet   Other Relevant Orders   Lipid Panel   Need for immunization against influenza   Relevant Orders   Flu Vaccine QUAD High Dose(Fluad) (Completed)        Follow-up: Return in about 3 months (around 04/22/2020).    Daryll Drown, NP

## 2020-01-22 NOTE — Assessment & Plan Note (Signed)
Essential hypertension well-controlled on amlodipine 5 mg tablet by mouth daily.  No changes to medication necessary.  Patient is not reporting any side effect from current medication, no headaches.  Dizziness present and ringing in left ear which is not new for patient.  Patient reports symptoms present prior to stroke.  Continue to provide education to patient with printed handouts given. Rx refill sent to pharmacy.  Follow-up in 3 months.

## 2020-01-22 NOTE — Patient Instructions (Signed)
High Cholesterol  High cholesterol is a condition in which the blood has high levels of a white, waxy, fat-like substance (cholesterol). The human body needs small amounts of cholesterol. The liver makes all the cholesterol that the body needs. Extra (excess) cholesterol comes from the food that we eat. Cholesterol is carried from the liver by the blood through the blood vessels. If you have high cholesterol, deposits (plaques) may build up on the walls of your blood vessels (arteries). Plaques make the arteries narrower and stiffer. Cholesterol plaques increase your risk for heart attack and stroke. Work with your health care provider to keep your cholesterol levels in a healthy range. What increases the risk? This condition is more likely to develop in people who:  Eat foods that are high in animal fat (saturated fat) or cholesterol.  Are overweight.  Are not getting enough exercise.  Have a family history of high cholesterol. What are the signs or symptoms? There are no symptoms of this condition. How is this diagnosed? This condition may be diagnosed from the results of a blood test.  If you are older than age 20, your health care provider may check your cholesterol every 4-6 years.  You may be checked more often if you already have high cholesterol or other risk factors for heart disease. The blood test for cholesterol measures:  "Bad" cholesterol (LDL cholesterol). This is the main type of cholesterol that causes heart disease. The desired level for LDL is less than 100.  "Good" cholesterol (HDL cholesterol). This type helps to protect against heart disease by cleaning the arteries and carrying the LDL away. The desired level for HDL is 60 or higher.  Triglycerides. These are fats that the body can store or burn for energy. The desired number for triglycerides is lower than 150.  Total cholesterol. This is a measure of the total amount of cholesterol in your blood, including LDL  cholesterol, HDL cholesterol, and triglycerides. A healthy number is less than 200. How is this treated? This condition is treated with diet changes, lifestyle changes, and medicines. Diet changes  This may include eating more whole grains, fruits, vegetables, nuts, and fish.  This may also include cutting back on red meat and foods that have a lot of added sugar. Lifestyle changes  Changes may include getting at least 40 minutes of aerobic exercise 3 times a week. Aerobic exercises include walking, biking, and swimming. Aerobic exercise along with a healthy diet can help you maintain a healthy weight.  Changes may also include quitting smoking. Medicines  Medicines are usually given if diet and lifestyle changes have failed to reduce your cholesterol to healthy levels.  Your health care provider may prescribe a statin medicine. Statin medicines have been shown to reduce cholesterol, which can reduce the risk of heart disease. Follow these instructions at home: Eating and drinking If told by your health care provider:  Eat chicken (without skin), fish, veal, shellfish, ground turkey breast, and round or loin cuts of red meat.  Do not eat fried foods or fatty meats, such as hot dogs and salami.  Eat plenty of fruits, such as apples.  Eat plenty of vegetables, such as broccoli, potatoes, and carrots.  Eat beans, peas, and lentils.  Eat grains such as barley, rice, couscous, and bulgur wheat.  Eat pasta without cream sauces.  Use skim or nonfat milk, and eat low-fat or nonfat yogurt and cheeses.  Do not eat or drink whole milk, cream, ice cream, egg yolks,   or hard cheeses.  Do not eat stick margarine or tub margarines that contain trans fats (also called partially hydrogenated oils).  Do not eat saturated tropical oils, such as coconut oil and palm oil.  Do not eat cakes, cookies, crackers, or other baked goods that contain trans fats.  General instructions  Exercise as  directed by your health care provider. Increase your activity level with activities such as gardening, walking, and taking the stairs.  Take over-the-counter and prescription medicines only as told by your health care provider.  Do not use any products that contain nicotine or tobacco, such as cigarettes and e-cigarettes. If you need help quitting, ask your health care provider.  Keep all follow-up visits as told by your health care provider. This is important. Contact a health care provider if:  You are struggling to maintain a healthy diet or weight.  You need help to start on an exercise program.  You need help to stop smoking. Get help right away if:  You have chest pain.  You have trouble breathing. This information is not intended to replace advice given to you by your health care provider. Make sure you discuss any questions you have with your health care provider. Document Revised: 02/12/2017 Document Reviewed: 08/10/2015 Elsevier Patient Education  2020 Else[employer] Inc. Hypertension, Adult Hypertension is another name for high blood pressure. High blood pressure forces your heart to work harder to pump blood. This can cause problems over time. There are two numbers in a blood pressure reading. There is a top number (systolic) over a bottom number (diastolic). It is best to have a blood pressure that is below 120/80. Healthy choices can help lower your blood pressure, or you may need medicine to help lower it. What are the causes? The cause of this condition is not known. Some conditions may be related to high blood pressure. What increases the risk?  Smoking.  Having type 2 diabetes mellitus, high cholesterol, or both.  Not getting enough exercise or physical activity.  Being overweight.  Having too much fat, sugar, calories, or salt (sodium) in your diet.  Drinking too much alcohol.  Having long-term (chronic) kidney disease.  Having a family history of high blood  pressure.  Age. Risk increases with age.  Race. You may be at higher risk if you are African American.  Gender. Men are at higher risk than women before age 45. After age 65, women are at higher risk than men.  Having obstructive sleep apnea.  Stress. What are the signs or symptoms?  High blood pressure may not cause symptoms. Very high blood pressure (hypertensive crisis) may cause: ? Headache. ? Feelings of worry or nervousness (anxiety). ? Shortness of breath. ? Nosebleed. ? A feeling of being sick to your stomach (nausea). ? Throwing up (vomiting). ? Changes in how you see. ? Very bad chest pain. ? Seizures. How is this treated?  This condition is treated by making healthy lifestyle changes, such as: ? Eating healthy foods. ? Exercising more. ? Drinking less alcohol.  Your health care provider may prescribe medicine if lifestyle changes are not enough to get your blood pressure under control, and if: ? Your top number is above 130. ? Your bottom number is above 80.  Your personal target blood pressure may vary. Follow these instructions at home: Eating and drinking   If told, follow the DASH eating plan. To follow this plan: ? Fill one half of your plate at each meal with fruits and   vegetables. ? Fill one fourth of your plate at each meal with whole grains. Whole grains include whole-wheat pasta, brown rice, and whole-grain bread. ? Eat or drink low-fat dairy products, such as skim milk or low-fat yogurt. ? Fill one fourth of your plate at each meal with low-fat (lean) proteins. Low-fat proteins include fish, chicken without skin, eggs, beans, and tofu. ? Avoid fatty meat, cured and processed meat, or chicken with skin. ? Avoid pre-made or processed food.  Eat less than 1,500 mg of salt each day.  Do not drink alcohol if: ? Your doctor tells you not to drink. ? You are pregnant, may be pregnant, or are planning to become pregnant.  If you drink  alcohol: ? Limit how much you use to:  0-1 drink a day for women.  0-2 drinks a day for men. ? Be aware of how much alcohol is in your drink. In the [place] one drink equals one 12 oz bottle of beer (355 mL), one 5 oz glass of wine (148 mL), or one 1 oz glass of hard liquor (44 mL). Lifestyle   Work with your doctor to stay at a healthy weight or to lose weight. Ask your doctor what the best weight is for you.  Get at least 30 minutes of exercise most days of the week. This may include walking, swimming, or biking.  Get at least 30 minutes of exercise that strengthens your muscles (resistance exercise) at least 3 days a week. This may include lifting weights or doing Pilates.  Do not use any products that contain nicotine or tobacco, such as cigarettes, e-cigarettes, and chewing tobacco. If you need help quitting, ask your doctor.  Check your blood pressure at home as told by your doctor.  Keep all follow-up visits as told by your doctor. This is important. Medicines  Take over-the-counter and prescription medicines only as told by your doctor. Follow directions carefully.  Do not skip doses of blood pressure medicine. The medicine does not work as well if you skip doses. Skipping doses also puts you at risk for problems.  Ask your doctor about side effects or reactions to medicines that you should watch for. Contact a doctor if you:  Think you are having a reaction to the medicine you are taking.  Have headaches that keep coming back (recurring).  Feel dizzy.  Have swelling in your ankles.  Have trouble with your vision. Get help right away if you:  Get a very bad headache.  Start to feel mixed up (confused).  Feel weak or numb.  Feel faint.  Have very bad pain in your: ? Chest. ? Belly (abdomen).  Throw up more than once.  Have trouble breathing. Summary  Hypertension is another name for high blood pressure.  High blood pressure forces your heart to work  harder to pump blood.  For most people, a normal blood pressure is less than 120/80.  Making healthy choices can help lower blood pressure. If your blood pressure does not get lower with healthy choices, you may need to take medicine. This information is not intended to replace advice given to you by your health care provider. Make sure you discuss any questions you have with your health care provider. Document Revised: 10/20/2017 Document Reviewed: 10/20/2017 Elsevier Patient Education  2020 Elsevier Inc.  

## 2020-01-22 NOTE — Addendum Note (Signed)
Addended by: Adella Hare B on: 01/22/2020 11:32 AM   Modules accepted: Orders

## 2020-01-23 ENCOUNTER — Other Ambulatory Visit: Payer: Self-pay | Admitting: Nurse Practitioner

## 2020-01-23 LAB — COMPREHENSIVE METABOLIC PANEL
ALT: 12 IU/L (ref 0–44)
AST: 22 IU/L (ref 0–40)
Albumin/Globulin Ratio: 1.7 (ref 1.2–2.2)
Albumin: 4.4 g/dL (ref 3.7–4.7)
Alkaline Phosphatase: 65 IU/L (ref 44–121)
BUN/Creatinine Ratio: 10 (ref 10–24)
BUN: 12 mg/dL (ref 8–27)
Bilirubin Total: 0.3 mg/dL (ref 0.0–1.2)
CO2: 24 mmol/L (ref 20–29)
Calcium: 9.5 mg/dL (ref 8.6–10.2)
Chloride: 104 mmol/L (ref 96–106)
Creatinine, Ser: 1.16 mg/dL (ref 0.76–1.27)
GFR calc Af Amer: 72 mL/min/{1.73_m2} (ref >59)
GFR calc non Af Amer: 62 mL/min/{1.73_m2} (ref >59)
Globulin, Total: 2.6 g/dL (ref 1.5–4.5)
Glucose: 95 mg/dL (ref 65–99)
Potassium: 5 mmol/L (ref 3.5–5.2)
Sodium: 143 mmol/L (ref 134–144)
Total Protein: 7 g/dL (ref 6.0–8.5)

## 2020-01-23 LAB — LIPID PANEL
Chol/HDL Ratio: 5.4 ratio — ABNORMAL HIGH (ref 0.0–5.0)
Cholesterol, Total: 227 mg/dL — ABNORMAL HIGH (ref 100–199)
HDL: 42 mg/dL (ref >39)
LDL Chol Calc (NIH): 166 mg/dL — ABNORMAL HIGH (ref 0–99)
Triglycerides: 106 mg/dL (ref 0–149)
VLDL Cholesterol Cal: 19 mg/dL (ref 5–40)

## 2020-01-23 MED ORDER — EZETIMIBE 10 MG PO TABS: 10.0000 mg | ORAL_TABLET | Freq: Every day | ORAL | 0 refills | Status: DC

## 2020-04-23 ENCOUNTER — Ambulatory Visit (INDEPENDENT_AMBULATORY_CARE_PROVIDER_SITE_OTHER): Payer: Medicare Other | Admitting: Family Medicine

## 2020-04-23 ENCOUNTER — Other Ambulatory Visit: Payer: Self-pay

## 2020-04-23 ENCOUNTER — Encounter: Payer: Self-pay | Admitting: Family Medicine

## 2020-04-23 VITALS — BP 134/62 | HR 82 | Temp 97.6°F | Ht 67.0 in | Wt 149.2 lb

## 2020-04-23 DIAGNOSIS — E785 Hyperlipidemia, unspecified: Secondary | ICD-10-CM

## 2020-04-23 DIAGNOSIS — Z1211 Encounter for screening for malignant neoplasm of colon: Secondary | ICD-10-CM

## 2020-04-23 DIAGNOSIS — Z8673 Personal history of transient ischemic attack (TIA), and cerebral infarction without residual deficits: Secondary | ICD-10-CM

## 2020-04-23 DIAGNOSIS — J302 Other seasonal allergic rhinitis: Secondary | ICD-10-CM

## 2020-04-23 DIAGNOSIS — I10 Essential (primary) hypertension: Secondary | ICD-10-CM | POA: Diagnosis not present

## 2020-04-23 DIAGNOSIS — I6930 Unspecified sequelae of cerebral infarction: Secondary | ICD-10-CM | POA: Insufficient documentation

## 2020-04-23 MED ORDER — LORATADINE 10 MG PO TABS: 10.0000 mg | ORAL_TABLET | Freq: Every day | ORAL | 11 refills | Status: DC

## 2020-04-23 MED ORDER — CLOPIDOGREL BISULFATE 75 MG PO TABS: 75.0000 mg | ORAL_TABLET | Freq: Every day | ORAL | 1 refills | Status: DC

## 2020-04-23 MED ORDER — FLUTICASONE PROPIONATE 50 MCG/ACT NA SUSP: 2.0000 | Freq: Every day | NASAL | 6 refills | Status: DC

## 2020-04-23 MED ORDER — ATORVASTATIN CALCIUM 80 MG PO TABS: 80.0000 mg | ORAL_TABLET | Freq: Every day | ORAL | 1 refills | Status: DC

## 2020-04-23 MED ORDER — EZETIMIBE 10 MG PO TABS: 10.0000 mg | ORAL_TABLET | Freq: Every day | ORAL | 1 refills | Status: DC

## 2020-04-23 NOTE — Progress Notes (Signed)
Assessment & Plan:  1. Essential hypertension, benign Well controlled on current regimen.   2. Hyperlipidemia with target LDL less than 100 Uncontrolled.  Discussed with patient he needs to be taking both atorvastatin and Zetia daily.  Encouraged to take Lipitor at bedtime due to symptoms of dizziness when he takes it.  Education provided on hyperlipidemia. - ezetimibe (ZETIA) 10 MG tablet; Take 1 tablet (10 mg total) by mouth daily.  Dispense: 90 tablet; Refill: 1 - atorvastatin (LIPITOR) 80 MG tablet; Take 1 tablet (80 mg total) by mouth daily at 6 PM.  Dispense: 90 tablet; Refill: 1  3. History of stroke Managed by neurology.  Continue Plavix. - clopidogrel (PLAVIX) 75 MG tablet; Take 1 tablet (75 mg total) by mouth daily.  Dispense: 90 tablet; Refill: 1  4. Seasonal allergies Uncontrolled.  Started patient on Flonase and Claritin. - fluticasone (FLONASE) 50 MCG/ACT nasal spray; Place 2 sprays into both nostrils daily.  Dispense: 16 g; Refill: 6 - loratadine (CLARITIN) 10 MG tablet; Take 1 tablet (10 mg total) by mouth daily.  Dispense: 30 tablet; Refill: 11  5. Colon cancer screening - Cologuard   Return in about 3 months (around 07/24/2020) for follow-up of chronic medication conditions.  Deliah Boston, MSN, APRN, FNP-C [facility]  Subjective:    Patient ID: Grant Berry, male    DOB: 1945-04-05, 75 y.o.   MRN: [id number]  Patient Care Team: Gwenlyn Fudge, FNP as PCP - General (Family Medicine)   Chief Complaint:  Chief Complaint  Patient presents with  . Hypertension  . Hyperlipidemia    3 month follow up   . Allergies    X 3 weeks     HPI: Grant Berry is a 75 y.o. male presenting on 04/23/2020 for Hypertension, Hyperlipidemia (3 month follow up ), and Allergies (X 3 weeks/)  Patient is accompanied by his son who he is okay with being present.  Hypertension: Patient does check his blood pressure at home and reports readings  130s/60-70s.  He does add some salt to his foods.  He is exercising by walking and doing push-ups and squats.  Hyperlipidemia: After lab work resulted 3 months ago patient had Zetia 10 mg added to his medication regimen.  He reports today that he thought starting that and that he could stop the Lipitor, so he has not been taking it at all.  He has only taking the Zetia sometimes.  The only thing he takes on a regular basis is the red yeast rice.  History of stroke: Patient is taking Plavix on a daily basis.  He does have a neurologist, Dr. Pearlean Brownie.  His cholesterol is uncontrolled.  He is doing exercises to strengthen up at home.  New complaints: Patient reports his allergies are acting up.  He endorses symptoms of postnasal drainage, sneezing, itchy ears, and watery eyes.  States this happens every year.  He is not currently taking any medications to treat, including OTCs.  Social history:  Relevant past medical, surgical, family and social history reviewed and updated as indicated. Interim medical history since our last visit reviewed.  Allergies and medications reviewed and updated.  DATA REVIEWED: CHART IN EPIC  ROS: Negative unless specifically indicated above in HPI.    Current Outpatient Medications:  .  amLODipine (NORVASC) 5 MG tablet, Take 1 tablet (5 mg total) by mouth daily., Disp: 90 tablet, Rfl: 1 .  aspirin 325 MG tablet, Take 1 tablet (325 mg total)  by mouth daily., Disp: 30 tablet, Rfl: 1 .  atorvastatin (LIPITOR) 80 MG tablet, Take 1 tablet (80 mg total) by mouth daily at 6 PM., Disp: 90 tablet, Rfl: 1 .  B Complex-C (B-COMPLEX WITH VITAMIN C) tablet, Take 1 tablet by mouth daily., Disp: 90 tablet, Rfl: 1 .  clopidogrel (PLAVIX) 75 MG tablet, Take 1 tablet (75 mg total) by mouth daily., Disp: 90 tablet, Rfl: 1 .  ezetimibe (ZETIA) 10 MG tablet, Take 1 tablet (10 mg total) by mouth daily., Disp: 90 tablet, Rfl: 0 .  Flaxseed, Linseed, (FLAXSEED OIL) 1000 MG CAPS, Take 2,000  mg by mouth in the morning and at bedtime., Disp: , Rfl:  .  Red Yeast Rice 600 MG CAPS, Take 1,200 mg by mouth daily., Disp: , Rfl:  .  Saw Palmetto 450 MG CAPS, Take 900 mg by mouth daily., Disp: , Rfl:    No Known Allergies Past Medical History:  Diagnosis Date  . Hyperlipidemia   . Hypertension   . Stroke [facility])    Left sided weakness 7/21    Past Surgical History:  Procedure Laterality Date  . EYE SURGERY Bilateral   . TONSILECTOMY/ADENOIDECTOMY WITH MYRINGOTOMY      Social History   Socioeconomic History  . Marital status: Widowed    Spouse name: Not on file  . Number of children: 1  . Years of education: Not on file  . Highest education level: Not on file  Occupational History  . Occupation: retired   Tobacco Use  . Smoking status: Former Smoker    Quit date: 05/24/1988    Years since quitting: 31.9  . Smokeless tobacco: Never Used  Vaping Use  . Vaping Use: Never used  Substance and Sexual Activity  . Alcohol use: No  . Drug use: No  . Sexual activity: Not on file  Other Topics Concern  . Not on file  Social History Narrative   Live at home = son stays there    Right Handed   Drinks 1-2 cups of caffeine/daily   Social Determinants of Health   Financial Resource Strain: Not on file  Food Insecurity: Not on file  Transportation Needs: Not on file  Physical Activity: Not on file  Stress: Not on file  Social Connections: Not on file  Intimate Partner Violence: Not on file        Objective:    BP 134/62 Comment: home reading  Pulse 82   Temp 97.6 F (36.4 C) (Temporal)   Ht 5\' 7"  (1.702 m)   Wt 149 lb 3.2 oz (67.7 kg)   SpO2 100%   BMI 23.37 kg/m   Wt Readings from Last 3 Encounters:  04/23/20 149 lb 3.2 oz (67.7 kg)  01/22/20 156 lb 6.4 oz (70.9 kg)  11/30/19 164 lb (74.4 kg)    Physical Exam Vitals reviewed.  Constitutional:      General: He is not in acute distress.    Appearance: Normal appearance. He is not ill-appearing,  toxic-appearing or diaphoretic.  HENT:     Head: Normocephalic and atraumatic.  Eyes:     General: No scleral icterus.       Right eye: No discharge.        Left eye: No discharge.     Conjunctiva/sclera: Conjunctivae normal.  Cardiovascular:     Rate and Rhythm: Normal rate and regular rhythm.     Heart sounds: Normal heart sounds. No murmur heard. No friction rub. No gallop.  Pulmonary:     Effort: Pulmonary effort is normal. No respiratory distress.     Breath sounds: Normal breath sounds. No stridor. No wheezing, rhonchi or rales.  Musculoskeletal:        General: Normal range of motion.     Cervical back: Normal range of motion.  Skin:    General: Skin is warm and dry.  Neurological:     Mental Status: He is alert and oriented to person, place, and time. Mental status is at baseline.     Gait: Gait abnormal (uses rolling walker).  Psychiatric:        Mood and Affect: Mood normal.        Behavior: Behavior normal.        Thought Content: Thought content normal.        Judgment: Judgment normal.     No results found for: TSH Lab Results  Component Value Date   WBC 6.7 01/22/2020   HGB 14.8 01/22/2020   HCT 43.8 01/22/2020   MCV 87 01/22/2020   PLT 219 01/22/2020   Lab Results  Component Value Date   NA 143 01/22/2020   K 5.0 01/22/2020   CO2 24 01/22/2020   GLUCOSE 95 01/22/2020   BUN 12 01/22/2020   CREATININE 1.16 01/22/2020   BILITOT 0.3 01/22/2020   ALKPHOS 65 01/22/2020   AST 22 01/22/2020   ALT 12 01/22/2020   PROT 7.0 01/22/2020   ALBUMIN 4.4 01/22/2020   CALCIUM 9.5 01/22/2020   ANIONGAP 10 09/02/2019   Lab Results  Component Value Date   CHOL 227 (H) 01/22/2020   Lab Results  Component Value Date   HDL 42 01/22/2020   Lab Results  Component Value Date   LDLCALC 166 (H) 01/22/2020   Lab Results  Component Value Date   TRIG 106 01/22/2020   Lab Results  Component Value Date   CHOLHDL 5.4 (H) 01/22/2020   Lab Results  Component  Value Date   HGBA1C 5.6 09/02/2019

## 2020-04-23 NOTE — Patient Instructions (Signed)
High Cholesterol  High cholesterol is a condition in which the blood has high levels of a white, waxy substance similar to fat (cholesterol). The liver makes all the cholesterol that the body needs. The human body needs small amounts of cholesterol to help build cells. A person gets extra or excess cholesterol from the food that he or she eats. The blood carries cholesterol from the liver to the rest of the body. If you have high cholesterol, deposits (plaques) may build up on the walls of your arteries. Arteries are the blood vessels that carry blood away from your heart. These plaques make the arteries narrow and stiff. Cholesterol plaques increase your risk for heart attack and stroke. Work with your health care provider to keep your cholesterol levels in a healthy range. What increases the risk? The following factors may make you more likely to develop this condition:  Eating foods that are high in animal fat (saturated fat) or cholesterol.  Being overweight.  Not getting enough exercise.  A family history of high cholesterol (familial hypercholesterolemia).  Use of tobacco products.  Having diabetes. What are the signs or symptoms? There are no symptoms of this condition. How is this diagnosed? This condition may be diagnosed based on the results of a blood test.  If you are older than 74 years of age, your health care provider may check your cholesterol levels every 4-6 years.  You may be checked more often if you have high cholesterol or other risk factors for heart disease. The blood test for cholesterol measures:  "Bad" cholesterol, or LDL cholesterol. This is the main type of cholesterol that causes heart disease. The desired level is less than 100 mg/dL.  "Good" cholesterol, or HDL cholesterol. HDL helps protect against heart disease by cleaning the arteries and carrying the LDL to the liver for processing. The desired level for HDL is 60 mg/dL or higher.  Triglycerides.  These are fats that your body can store or burn for energy. The desired level is less than 150 mg/dL.  Total cholesterol. This measures the total amount of cholesterol in your blood and includes LDL, HDL, and triglycerides. The desired level is less than 200 mg/dL. How is this treated? This condition may be treated with:  Diet changes. You may be asked to eat foods that have more fiber and less saturated fats or added sugar.  Lifestyle changes. These may include regular exercise, maintaining a healthy weight, and quitting use of tobacco products.  Medicines. These are given when diet and lifestyle changes have not worked. You may be prescribed a statin medicine to help lower your cholesterol levels. Follow these instructions at home: Eating and drinking  Eat a healthy, balanced diet. This diet includes: ? Daily servings of a variety of fresh, frozen, or canned fruits and vegetables. ? Daily servings of whole grain foods that are rich in fiber. ? Foods that are low in saturated fats and trans fats. These include poultry and fish without skin, lean cuts of meat, and low-fat dairy products. ? A variety of fish, especially oily fish that contain omega-3 fatty acids. Aim to eat fish at least 2 times a week.  Avoid foods and drinks that have added sugar.  Use healthy cooking methods, such as roasting, grilling, broiling, baking, poaching, steaming, and stir-frying. Do not fry your food except for stir-frying.   Lifestyle  Get regular exercise. Aim to exercise for a total of 150 minutes a week. Increase your activity level by doing activities   such as gardening, walking, and taking the stairs.  Do not use any products that contain nicotine or tobacco, such as cigarettes, e-cigarettes, and chewing tobacco. If you need help quitting, ask your health care provider.   General instructions  Take over-the-counter and prescription medicines only as told by your health care provider.  Keep all  follow-up visits as told by your health care provider. This is important. Where to find more information  American Heart Association: www.heart.org  National Heart, Lung, and Blood Institute: www.nhlbi.nih.gov Contact a health care provider if:  You have trouble achieving or maintaining a healthy diet or weight.  You are starting an exercise program.  You are unable to stop smoking. Get help right away if:  You have chest pain.  You have trouble breathing.  You have any symptoms of a stroke. "BE FAST" is an easy way to remember the main warning signs of a stroke: ? B - Balance. Signs are dizziness, sudden trouble walking, or loss of balance. ? E - Eyes. Signs are trouble seeing or a sudden change in vision. ? F - Face. Signs are sudden weakness or numbness of the face, or the face or eyelid drooping on one side. ? A - Arms. Signs are weakness or numbness in an arm. This happens suddenly and usually on one side of the body. ? S - Speech. Signs are sudden trouble speaking, slurred speech, or trouble understanding what people say. ? T - Time. Time to call emergency services. Write down what time symptoms started.  You have other signs of a stroke, such as: ? A sudden, severe headache with no known cause. ? Nausea or vomiting. ? Seizure. These symptoms may represent a serious problem that is an emergency. Do not wait to see if the symptoms will go away. Get medical help right away. Call your local emergency services (911 in the [place] Do not drive yourself to the hospital. Summary  Cholesterol plaques increase your risk for heart attack and stroke. Work with your health care provider to keep your cholesterol levels in a healthy range.  Eat a healthy, balanced diet, get regular exercise, and maintain a healthy weight.  Do not use any products that contain nicotine or tobacco, such as cigarettes, e-cigarettes, and chewing tobacco.  Get help right away if you have any symptoms of a  stroke. This information is not intended to replace advice given to you by your health care provider. Make sure you discuss any questions you have with your health care provider. Document Revised: 01/09/2019 Document Reviewed: 01/09/2019 Elsevier Patient Education  2021 Elsevier Inc.  

## 2020-07-24 ENCOUNTER — Other Ambulatory Visit: Payer: Self-pay

## 2020-07-24 ENCOUNTER — Ambulatory Visit (INDEPENDENT_AMBULATORY_CARE_PROVIDER_SITE_OTHER): Payer: Medicare Other | Admitting: Family Medicine

## 2020-07-24 ENCOUNTER — Encounter: Payer: Self-pay | Admitting: Family Medicine

## 2020-07-24 VITALS — BP 132/74 | HR 90 | Temp 98.0°F | Ht 67.0 in | Wt 149.4 lb

## 2020-07-24 DIAGNOSIS — E785 Hyperlipidemia, unspecified: Secondary | ICD-10-CM | POA: Diagnosis not present

## 2020-07-24 DIAGNOSIS — I10 Essential (primary) hypertension: Secondary | ICD-10-CM | POA: Diagnosis not present

## 2020-07-24 MED ORDER — AMLODIPINE BESYLATE 5 MG PO TABS: 5.0000 mg | ORAL_TABLET | Freq: Every day | ORAL | 1 refills | Status: DC

## 2020-07-24 MED ORDER — PRAVASTATIN SODIUM 40 MG PO TABS: 40.0000 mg | ORAL_TABLET | Freq: Every day | ORAL | 2 refills | Status: DC

## 2020-07-24 MED ORDER — EZETIMIBE 10 MG PO TABS: 10.0000 mg | ORAL_TABLET | Freq: Every day | ORAL | 1 refills | Status: DC

## 2020-07-24 NOTE — Patient Instructions (Signed)
High Cholesterol  High cholesterol is a condition in which the blood has high levels of a white, waxy substance similar to fat (cholesterol). The liver makes all the cholesterol that the body needs. The human body needs small amounts of cholesterol to help build cells. A person gets extra or excess cholesterol from the food that he or she eats. The blood carries cholesterol from the liver to the rest of the body. If you have high cholesterol, deposits (plaques) may build up on the walls of your arteries. Arteries are the blood vessels that carry blood away from your heart. These plaques make the arteries narrow and stiff. Cholesterol plaques increase your risk for heart attack and stroke. Work with your health care provider to keep your cholesterol levels in a healthy range. What increases the risk? The following factors may make you more likely to develop this condition:  Eating foods that are high in animal fat (saturated fat) or cholesterol.  Being overweight.  Not getting enough exercise.  A family history of high cholesterol (familial hypercholesterolemia).  Use of tobacco products.  Having diabetes. What are the signs or symptoms? There are no symptoms of this condition. How is this diagnosed? This condition may be diagnosed based on the results of a blood test.  If you are older than 74 years of age, your health care provider may check your cholesterol levels every 4-6 years.  You may be checked more often if you have high cholesterol or other risk factors for heart disease. The blood test for cholesterol measures:  "Bad" cholesterol, or LDL cholesterol. This is the main type of cholesterol that causes heart disease. The desired level is less than 100 mg/dL.  "Good" cholesterol, or HDL cholesterol. HDL helps protect against heart disease by cleaning the arteries and carrying the LDL to the liver for processing. The desired level for HDL is 60 mg/dL or higher.  Triglycerides.  These are fats that your body can store or burn for energy. The desired level is less than 150 mg/dL.  Total cholesterol. This measures the total amount of cholesterol in your blood and includes LDL, HDL, and triglycerides. The desired level is less than 200 mg/dL. How is this treated? This condition may be treated with:  Diet changes. You may be asked to eat foods that have more fiber and less saturated fats or added sugar.  Lifestyle changes. These may include regular exercise, maintaining a healthy weight, and quitting use of tobacco products.  Medicines. These are given when diet and lifestyle changes have not worked. You may be prescribed a statin medicine to help lower your cholesterol levels. Follow these instructions at home: Eating and drinking  Eat a healthy, balanced diet. This diet includes: ? Daily servings of a variety of fresh, frozen, or canned fruits and vegetables. ? Daily servings of whole grain foods that are rich in fiber. ? Foods that are low in saturated fats and trans fats. These include poultry and fish without skin, lean cuts of meat, and low-fat dairy products. ? A variety of fish, especially oily fish that contain omega-3 fatty acids. Aim to eat fish at least 2 times a week.  Avoid foods and drinks that have added sugar.  Use healthy cooking methods, such as roasting, grilling, broiling, baking, poaching, steaming, and stir-frying. Do not fry your food except for stir-frying.   Lifestyle  Get regular exercise. Aim to exercise for a total of 150 minutes a week. Increase your activity level by doing activities   such as gardening, walking, and taking the stairs.  Do not use any products that contain nicotine or tobacco, such as cigarettes, e-cigarettes, and chewing tobacco. If you need help quitting, ask your health care provider.   General instructions  Take over-the-counter and prescription medicines only as told by your health care provider.  Keep all  follow-up visits as told by your health care provider. This is important. Where to find more information  American Heart Association: www.heart.org  National Heart, Lung, and Blood Institute: www.nhlbi.nih.gov Contact a health care provider if:  You have trouble achieving or maintaining a healthy diet or weight.  You are starting an exercise program.  You are unable to stop smoking. Get help right away if:  You have chest pain.  You have trouble breathing.  You have any symptoms of a stroke. "BE FAST" is an easy way to remember the main warning signs of a stroke: ? B - Balance. Signs are dizziness, sudden trouble walking, or loss of balance. ? E - Eyes. Signs are trouble seeing or a sudden change in vision. ? F - Face. Signs are sudden weakness or numbness of the face, or the face or eyelid drooping on one side. ? A - Arms. Signs are weakness or numbness in an arm. This happens suddenly and usually on one side of the body. ? S - Speech. Signs are sudden trouble speaking, slurred speech, or trouble understanding what people say. ? T - Time. Time to call emergency services. Write down what time symptoms started.  You have other signs of a stroke, such as: ? A sudden, severe headache with no known cause. ? Nausea or vomiting. ? Seizure. These symptoms may represent a serious problem that is an emergency. Do not wait to see if the symptoms will go away. Get medical help right away. Call your local emergency services (911 in the [place] Do not drive yourself to the hospital. Summary  Cholesterol plaques increase your risk for heart attack and stroke. Work with your health care provider to keep your cholesterol levels in a healthy range.  Eat a healthy, balanced diet, get regular exercise, and maintain a healthy weight.  Do not use any products that contain nicotine or tobacco, such as cigarettes, e-cigarettes, and chewing tobacco.  Get help right away if you have any symptoms of a  stroke. This information is not intended to replace advice given to you by your health care provider. Make sure you discuss any questions you have with your health care provider. Document Revised: 01/09/2019 Document Reviewed: 01/09/2019 Elsevier Patient Education  2021 Elsevier Inc.  

## 2020-07-24 NOTE — Progress Notes (Signed)
Assessment & Plan:  1. Essential hypertension, benign Well controlled on current regimen.  - amLODipine (NORVASC) 5 MG tablet; Take 1 tablet (5 mg total) by mouth daily.  Dispense: 90 tablet; Refill: 1 - CMP14+EGFR  2. Hyperlipidemia with target LDL less than 100 Uncontrolled. Continue Zetia. D/C'd Atorvastatin due to intolerance. Rx'd Pravastatin to see if he tolerates better. Education provided on high cholesterol.  - ezetimibe (ZETIA) 10 MG tablet; Take 1 tablet (10 mg total) by mouth daily.  Dispense: 90 tablet; Refill: 1 - CMP14+EGFR - Lipid panel - pravastatin (PRAVACHOL) 40 MG tablet; Take 1 tablet (40 mg total) by mouth daily.  Dispense: 30 tablet; Refill: 2   Return in about 3 months (around 10/24/2020) for annual physical.  Hendricks Limes, MSN, APRN, FNP-C [facility]  Subjective:    Patient ID: Grant Berry, male    DOB: 03/26/1945, 75 y.o.   MRN: [id number]  Patient Care Team: Loman Brooklyn, FNP as PCP - General (Family Medicine) Garvin Fila, MD as Consulting Physician (Neurology)   Chief Complaint:  Chief Complaint  Patient presents with   Hypertension   Hyperlipidemia    3 month follow up of chronic medical conditions     HPI: Grant Berry is a 75 y.o. male presenting on 07/24/2020 for Hypertension and Hyperlipidemia (3 month follow up of chronic medical conditions )  Patient is accompanied by his son who he is okay with being present.  Hypertension: Patient does check his blood pressure at home and reports readings 116-120/60-70s.    Hyperlipidemia: After lab work resulted 6 months ago patient had Zetia 10 mg added to his medication regimen.  He reported at our last visit that he wasn't taking the Lipitor; he was advised to resume. He states today that he did initially but then stopped it because it made him "feel like crap" and caused muscle pain and dizziness.    New complaints: None  Social history:  Relevant  past medical, surgical, family and social history reviewed and updated as indicated. Interim medical history since our last visit reviewed.  Allergies and medications reviewed and updated.  DATA REVIEWED: CHART IN EPIC  ROS: Negative unless specifically indicated above in HPI.    Current Outpatient Medications:    amLODipine (NORVASC) 5 MG tablet, Take 1 tablet (5 mg total) by mouth daily., Disp: 90 tablet, Rfl: 1   aspirin 325 MG tablet, Take 1 tablet (325 mg total) by mouth daily., Disp: 30 tablet, Rfl: 1   B Complex-C (B-COMPLEX WITH VITAMIN C) tablet, Take 1 tablet by mouth daily., Disp: 90 tablet, Rfl: 1   clopidogrel (PLAVIX) 75 MG tablet, Take 1 tablet (75 mg total) by mouth daily., Disp: 90 tablet, Rfl: 1   ezetimibe (ZETIA) 10 MG tablet, Take 1 tablet (10 mg total) by mouth daily., Disp: 90 tablet, Rfl: 1   Flaxseed, Linseed, (FLAXSEED OIL) 1000 MG CAPS, Take 2,000 mg by mouth in the morning and at bedtime., Disp: , Rfl:    fluticasone (FLONASE) 50 MCG/ACT nasal spray, Place 2 sprays into both nostrils daily., Disp: 16 g, Rfl: 6   loratadine (CLARITIN) 10 MG tablet, Take 1 tablet (10 mg total) by mouth daily., Disp: 30 tablet, Rfl: 11   Red Yeast Rice 600 MG CAPS, Take 1,200 mg by mouth daily., Disp: , Rfl:    Saw Palmetto 450 MG CAPS, Take 900 mg by mouth daily., Disp: , Rfl:    No Known Allergies Past  Medical History:  Diagnosis Date   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   Hyperlipidemia    Hypertension     Past Surgical History:  Procedure Laterality Date   EYE SURGERY Bilateral    TONSILECTOMY/ADENOIDECTOMY WITH MYRINGOTOMY      Social History   Socioeconomic History   Marital status: Widowed    Spouse name: Not on file   Number of children: 1   Years of education: Not on file   Highest education level: Not on file  Occupational History   Occupation: retired   Tobacco Use   Smoking status: Former Smoker    Quit date: 05/24/1988    Years since quitting: 32.1    Smokeless tobacco: Never Used  Vaping Use   Vaping Use: Never used  Substance and Sexual Activity   Alcohol use: No   Drug use: No   Sexual activity: Not on file  Other Topics Concern   Not on file  Social History Narrative   Live at home = son stays there    Right Handed   Drinks 1-2 cups of caffeine/daily   Social Determinants of Health   Financial Resource Strain: Not on file  Food Insecurity: Not on file  Transportation Needs: Not on file  Physical Activity: Not on file  Stress: Not on file  Social Connections: Not on file  Intimate Partner Violence: Not on file        Objective:    BP 132/74 Comment: manual  Pulse 90   Temp 98 F (36.7 C) (Temporal)   Ht 5' 7"  (1.702 m)   Wt 149 lb 6.4 oz (67.8 kg)   SpO2 96%   BMI 23.40 kg/m   Wt Readings from Last 3 Encounters:  07/24/20 149 lb 6.4 oz (67.8 kg)  04/23/20 149 lb 3.2 oz (67.7 kg)  01/22/20 156 lb 6.4 oz (70.9 kg)    Physical Exam Vitals reviewed.  Constitutional:      General: He is not in acute distress.    Appearance: Normal appearance. He is not ill-appearing, toxic-appearing or diaphoretic.  HENT:     Head: Normocephalic and atraumatic.  Eyes:     General: No scleral icterus.       Right eye: No discharge.        Left eye: No discharge.     Conjunctiva/sclera: Conjunctivae normal.  Cardiovascular:     Rate and Rhythm: Normal rate and regular rhythm.     Heart sounds: Normal heart sounds. No murmur heard.   No friction rub. No gallop.  Pulmonary:     Effort: Pulmonary effort is normal. No respiratory distress.     Breath sounds: Normal breath sounds. No stridor. No wheezing, rhonchi or rales.  Musculoskeletal:        General: Normal range of motion.     Cervical back: Normal range of motion.  Skin:    General: Skin is warm and dry.  Neurological:     Mental Status: He is alert and oriented to person, place, and time. Mental status is at baseline.     Gait: Gait abnormal (uses rolling  walker).  Psychiatric:        Mood and Affect: Mood normal.        Behavior: Behavior normal.        Thought Content: Thought content normal.        Judgment: Judgment normal.    No results found for: TSH Lab Results  Component Value Date   WBC 6.7 01/22/2020  HGB 14.8 01/22/2020   HCT 43.8 01/22/2020   MCV 87 01/22/2020   PLT 219 01/22/2020   Lab Results  Component Value Date   NA 143 01/22/2020   K 5.0 01/22/2020   CO2 24 01/22/2020   GLUCOSE 95 01/22/2020   BUN 12 01/22/2020   CREATININE 1.16 01/22/2020   BILITOT 0.3 01/22/2020   ALKPHOS 65 01/22/2020   AST 22 01/22/2020   ALT 12 01/22/2020   PROT 7.0 01/22/2020   ALBUMIN 4.4 01/22/2020   CALCIUM 9.5 01/22/2020   ANIONGAP 10 09/02/2019   Lab Results  Component Value Date   CHOL 227 (H) 01/22/2020   Lab Results  Component Value Date   HDL 42 01/22/2020   Lab Results  Component Value Date   LDLCALC 166 (H) 01/22/2020   Lab Results  Component Value Date   TRIG 106 01/22/2020   Lab Results  Component Value Date   CHOLHDL 5.4 (H) 01/22/2020   Lab Results  Component Value Date   HGBA1C 5.6 09/02/2019

## 2020-07-25 LAB — CMP14+EGFR
ALT: 11 IU/L (ref 0–44)
AST: 19 IU/L (ref 0–40)
Albumin/Globulin Ratio: 1.8 (ref 1.2–2.2)
Albumin: 4.7 g/dL (ref 3.7–4.7)
Alkaline Phosphatase: 67 IU/L (ref 44–121)
BUN/Creatinine Ratio: 11 (ref 10–24)
BUN: 13 mg/dL (ref 8–27)
Bilirubin Total: 0.5 mg/dL (ref 0.0–1.2)
CO2: 25 mmol/L (ref 20–29)
Calcium: 9.9 mg/dL (ref 8.6–10.2)
Chloride: 104 mmol/L (ref 96–106)
Creatinine, Ser: 1.18 mg/dL (ref 0.76–1.27)
Globulin, Total: 2.6 g/dL (ref 1.5–4.5)
Glucose: 88 mg/dL (ref 65–99)
Potassium: 4.3 mmol/L (ref 3.5–5.2)
Sodium: 144 mmol/L (ref 134–144)
Total Protein: 7.3 g/dL (ref 6.0–8.5)
eGFR: 65 mL/min/{1.73_m2} (ref >59)

## 2020-07-25 LAB — LIPID PANEL
Chol/HDL Ratio: 5.3 ratio — ABNORMAL HIGH (ref 0.0–5.0)
Cholesterol, Total: 224 mg/dL — ABNORMAL HIGH (ref 100–199)
HDL: 42 mg/dL (ref >39)
LDL Chol Calc (NIH): 160 mg/dL — ABNORMAL HIGH (ref 0–99)
Triglycerides: 119 mg/dL (ref 0–149)
VLDL Cholesterol Cal: 22 mg/dL (ref 5–40)

## 2020-07-30 ENCOUNTER — Ambulatory Visit (INDEPENDENT_AMBULATORY_CARE_PROVIDER_SITE_OTHER): Payer: Medicare Other

## 2020-07-30 VITALS — Ht 67.0 in | Wt 149.0 lb

## 2020-07-30 DIAGNOSIS — Z0000 Encounter for general adult medical examination without abnormal findings: Secondary | ICD-10-CM

## 2020-07-30 NOTE — Patient Instructions (Signed)
Grant Berry , Thank you for taking time to come for your Medicare Wellness Visit. I appreciate your ongoing commitment to your health goals. Please review the following plan we discussed and let me know if I can assist you in the future.   Screening recommendations/referrals: Colonoscopy: Return Cologuard test soon Recommended yearly ophthalmology/optometry visit for glaucoma screening and checkup Recommended yearly dental visit for hygiene and checkup  Vaccinations: Influenza vaccine: Done 01/22/2020 - Repeat annually Pneumococcal vaccine: Done 12/26/2012 & 10/17/2019 Tdap vaccine: Due (every 10 years) Shingles vaccine: Not recommended (didn't have chicken pox)   Covid-19: Declined  Advanced directives: Advance directive discussed with you today. I have provided a copy for you to complete at home and have notarized. Once this is complete please bring a copy in to our office so we can scan it into your chart.  Conditions/risks identified: Aim for 30 minutes of exercise or brisk walking each day, drink 6-8 glasses of water and eat lots of fruits and vegetables.  Next appointment: Follow up in one year for your annual wellness visit.   Preventive Care 19 Years and Older, Male  Preventive care refers to lifestyle choices and visits with your health care provider that can promote health and wellness. What does preventive care include?  A yearly physical exam. This is also called an annual well check.  Dental exams once or twice a year.  Routine eye exams. Ask your health care provider how often you should have your eyes checked.  Personal lifestyle choices, including:  Daily care of your teeth and gums.  Regular physical activity.  Eating a healthy diet.  Avoiding tobacco and drug use.  Limiting alcohol use.  Practicing safe sex.  Taking low doses of aspirin every day.  Taking vitamin and mineral supplements as recommended by your health care provider. What happens during  an annual well check? The services and screenings done by your health care provider during your annual well check will depend on your age, overall health, lifestyle risk factors, and family history of disease. Counseling  Your health care provider may ask you questions about your:  Alcohol use.  Tobacco use.  Drug use.  Emotional well-being.  Home and relationship well-being.  Sexual activity.  Eating habits.  History of falls.  Memory and ability to understand (cognition).  Work and work Astronomer. Screening  You may have the following tests or measurements:  Height, weight, and BMI.  Blood pressure.  Lipid and cholesterol levels. These may be checked every 5 years, or more frequently if you are over 26 years old.  Skin check.  Lung cancer screening. You may have this screening every year starting at age 50 if you have a 30-pack-year history of smoking and currently smoke or have quit within the past 15 years.  Fecal occult blood test (FOBT) of the stool. You may have this test every year starting at age 2.  Flexible sigmoidoscopy or colonoscopy. You may have a sigmoidoscopy every 5 years or a colonoscopy every 10 years starting at age 70.  Prostate cancer screening. Recommendations will vary depending on your family history and other risks.  Hepatitis C blood test.  Hepatitis B blood test.  Sexually transmitted disease (STD) testing.  Diabetes screening. This is done by checking your blood sugar (glucose) after you have not eaten for a while (fasting). You may have this done every 1-3 years.  Abdominal aortic aneurysm (AAA) screening. You may need this if you are a current or former smoker.  Osteoporosis. You may be screened starting at age 45 if you are at high risk. Talk with your health care provider about your test results, treatment options, and if necessary, the need for more tests. Vaccines  Your health care provider may recommend certain vaccines,  such as:  Influenza vaccine. This is recommended every year.  Tetanus, diphtheria, and acellular pertussis (Tdap, Td) vaccine. You may need a Td booster every 10 years.  Zoster vaccine. You may need this after age 35.  Pneumococcal 13-valent conjugate (PCV13) vaccine. One dose is recommended after age 58.  Pneumococcal polysaccharide (PPSV23) vaccine. One dose is recommended after age 65. Talk to your health care provider about which screenings and vaccines you need and how often you need them. This information is not intended to replace advice given to you by your health care provider. Make sure you discuss any questions you have with your health care provider. Document Released: 03/08/2015 Document Revised: 10/30/2015 Document Reviewed: 12/11/2014 Elsevier Interactive Patient Education  2017 [address] Prevention in the Home Falls can cause injuries. They can happen to people of all ages. There are many things you can do to make your home safe and to help prevent falls. What can I do on the outside of my home?  Regularly fix the edges of walkways and driveways and fix any cracks.  Remove anything that might make you trip as you walk through a door, such as a raised step or threshold.  Trim any bushes or trees on the path to your home.  Use bright outdoor lighting.  Clear any walking paths of anything that might make someone trip, such as rocks or tools.  Regularly check to see if handrails are loose or broken. Make sure that both sides of any steps have handrails.  Any raised decks and porches should have guardrails on the edges.  Have any leaves, snow, or ice cleared regularly.  Use sand or salt on walking paths during winter.  Clean up any spills in your garage right away. This includes oil or grease spills. What can I do in the bathroom?  Use night lights.  Install grab bars by the toilet and in the tub and shower. Do not use towel bars as grab bars.  Use  non-skid mats or decals in the tub or shower.  If you need to sit down in the shower, use a plastic, non-slip stool.  Keep the floor dry. Clean up any water that spills on the floor as soon as it happens.  Remove soap buildup in the tub or shower regularly.  Attach bath mats securely with double-sided non-slip rug tape.  Do not have throw rugs and other things on the floor that can make you trip. What can I do in the bedroom?  Use night lights.  Make sure that you have a light by your bed that is easy to reach.  Do not use any sheets or blankets that are too big for your bed. They should not hang down onto the floor.  Have a firm chair that has side arms. You can use this for support while you get dressed.  Do not have throw rugs and other things on the floor that can make you trip. What can I do in the kitchen?  Clean up any spills right away.  Avoid walking on wet floors.  Keep items that you use a lot in easy-to-reach places.  If you need to reach something above you, use a strong step  stool that has a grab bar.  Keep electrical cords out of the way.  Do not use floor polish or wax that makes floors slippery. If you must use wax, use non-skid floor wax.  Do not have throw rugs and other things on the floor that can make you trip. What can I do with my stairs?  Do not leave any items on the stairs.  Make sure that there are handrails on both sides of the stairs and use them. Fix handrails that are broken or loose. Make sure that handrails are as long as the stairways.  Check any carpeting to make sure that it is firmly attached to the stairs. Fix any carpet that is loose or worn.  Avoid having throw rugs at the top or bottom of the stairs. If you do have throw rugs, attach them to the floor with carpet tape.  Make sure that you have a light switch at the top of the stairs and the bottom of the stairs. If you do not have them, ask someone to add them for you. What  else can I do to help prevent falls?  Wear shoes that:  Do not have high heels.  Have rubber bottoms.  Are comfortable and fit you well.  Are closed at the toe. Do not wear sandals.  If you use a stepladder:  Make sure that it is fully opened. Do not climb a closed stepladder.  Make sure that both sides of the stepladder are locked into place.  Ask someone to hold it for you, if possible.  Clearly mark and make sure that you can see:  Any grab bars or handrails.  First and last steps.  Where the edge of each step is.  Use tools that help you move around (mobility aids) if they are needed. These include:  Canes.  Walkers.  Scooters.  Crutches.  Turn on the lights when you go into a dark area. Replace any light bulbs as soon as they burn out.  Set up your furniture so you have a clear path. Avoid moving your furniture around.  If any of your floors are uneven, fix them.  If there are any pets around you, be aware of where they are.  Review your medicines with your doctor. Some medicines can make you feel dizzy. This can increase your chance of falling. Ask your doctor what other things that you can do to help prevent falls. This information is not intended to replace advice given to you by your health care provider. Make sure you discuss any questions you have with your health care provider. Document Released: 12/06/2008 Document Revised: 07/18/2015 Document Reviewed: 03/16/2014 Elsevier Interactive Patient Education  2017 [employer].

## 2020-07-30 NOTE — Progress Notes (Signed)
Subjective:   Grant Berry is a 75 y.o. male who presents for an Initial Medicare Annual Wellness Visit.  Virtual Visit via Telephone Note  I connected with  Gilda Crease on 07/30/20 at  2:45 PM EDT by telephone and verified that I am speaking with the correct person using two identifiers.  Location: Patient: Home Provider: [facility] Persons participating in the virtual visit: patient/Nurse Health Advisor   I discussed the limitations, risks, security and privacy concerns of performing an evaluation and management service by telephone and the availability of in person appointments. The patient expressed understanding and agreed to proceed.  Interactive audio and video telecommunications were attempted between this nurse and patient, however failed, due to patient having technical difficulties OR patient did not have access to video capability.  We continued and completed visit with audio only.  Some vital signs may be absent or patient reported.   Braidon Chermak E Kayvon Mo, LPN  Review of Systems     Cardiac Risk Factors include: advanced age (>73men, >31 women);dyslipidemia;hypertension;family history of premature cardiovascular disease;Other (see comment), Risk factor comments: hx of stroke     Objective:    Today's Vitals   07/30/20 1423  Weight: 149 lb (67.6 kg)  Height: 5\' 7"  (1.702 m)   Body mass index is 23.34 kg/m.  Advanced Directives 07/30/2020 09/02/2019  Does Patient Have a Medical Advance Directive? No No  Would patient like information on creating a medical advance directive? Yes (MAU/Ambulatory/Procedural Areas - Information given) No - Patient declined    Current Medications (verified) Outpatient Encounter Medications as of 07/30/2020  Medication Sig  . amLODipine (NORVASC) 5 MG tablet Take 1 tablet (5 mg total) by mouth daily.  09/29/2020 aspirin 325 MG tablet Take 1 tablet (325 mg total) by mouth daily.  . B Complex-C (B-COMPLEX WITH VITAMIN C) tablet Take 1 tablet by  mouth daily.  . clopidogrel (PLAVIX) 75 MG tablet Take 1 tablet (75 mg total) by mouth daily.  Marland Kitchen ezetimibe (ZETIA) 10 MG tablet Take 1 tablet (10 mg total) by mouth daily.  . Flaxseed, Linseed, (FLAXSEED OIL) 1000 MG CAPS Take 2,000 mg by mouth in the morning and at bedtime.  . fluticasone (FLONASE) 50 MCG/ACT nasal spray Place 2 sprays into both nostrils daily.  Marland Kitchen loratadine (CLARITIN) 10 MG tablet Take 1 tablet (10 mg total) by mouth daily.  . pravastatin (PRAVACHOL) 40 MG tablet Take 1 tablet (40 mg total) by mouth daily.  . Red Yeast Rice 600 MG CAPS Take 1,200 mg by mouth daily.  . Saw Palmetto 450 MG CAPS Take 900 mg by mouth daily.   No facility-administered encounter medications on file as of 07/30/2020.    Allergies (verified) Patient has no known allergies.   History: Past Medical History:  Diagnosis Date  . 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 Hyperlipidemia   . Hypertension    Past Surgical History:  Procedure Laterality Date  . EYE SURGERY Bilateral   . TONSILECTOMY/ADENOIDECTOMY WITH MYRINGOTOMY     Family History  Problem Relation Age of Onset  . Stroke Mother   . Coronary artery disease Mother   . Coronary artery disease Father   . Diabetes Maternal Grandfather    Social History   Socioeconomic History  . Marital status: Widowed    Spouse name: Not on file  . Number of children: 1  . Years of education: Not on file  . Highest education level: Not on file  Occupational History  . Occupation:  retired   Tobacco Use  . Smoking status: Former Smoker    Quit date: 05/24/1988    Years since quitting: 32.2  . Smokeless tobacco: Never Used  Vaping Use  . Vaping Use: Never used  Substance and Sexual Activity  . Alcohol use: No  . Drug use: No  . Sexual activity: Not on file  Other Topics Concern  . Not on file  Social History Narrative   Live at home = son stays there    Right Handed   Drinks 1-2 cups of caffeine/daily   Stroke in 2021, left him with  chronic dizziness and has to use a walker   Social Determinants of Health   Financial Resource Strain: Low Risk   . Difficulty of Paying Living Expenses: Not hard at all  Food Insecurity: No Food Insecurity  . Worried About Programme researcher, broadcasting/film/videounning Out of Food in the Last Year: Never true  . Ran Out of Food in the Last Year: Never true  Transportation Needs: No Transportation Needs  . Lack of Transportation (Medical): No  . Lack of Transportation (Non-Medical): No  Physical Activity: Insufficiently Active  . Days of Exercise per Week: 7 days  . Minutes of Exercise per Session: 20 min  Stress: No Stress Concern Present  . Feeling of Stress : Not at all  Social Connections: Socially Isolated  . Frequency of Communication with Friends and Family: More than three times a week  . Frequency of Social Gatherings with Friends and Family: Once a week  . Attends Religious Services: Never  . Active Member of Clubs or Organizations: No  . Attends BankerClub or Organization Meetings: Never  . Marital Status: Widowed    Tobacco Counseling Counseling given: Not Answered   Clinical Intake:  Pre-visit preparation completed: Yes  Pain : No/denies pain     BMI - recorded: 23.34 Nutritional Status: BMI of 19-24  Normal Nutritional Risks: None Diabetes: No  How often do you need to have someone help you when you read instructions, pamphlets, or other written materials from your doctor or pharmacy?: 1 - Never  Diabetic? No  Interpreter Needed?: No  Information entered by :: Madelaine Whipple, LPN   Activities of Daily Living In your present state of health, do you have any difficulty performing the following activities: 07/30/2020 09/02/2019  Hearing? Y N  Vision? N N  Difficulty concentrating or making decisions? N N  Walking or climbing stairs? Y N  Dressing or bathing? N N  Doing errands, shopping? N N  Preparing Food and eating ? N -  Using the Toilet? N -  In the past six months, have you accidently  leaked urine? N -  Do you have problems with loss of bowel control? N -  Managing your Medications? N -  Managing your Finances? N -  Housekeeping or managing your Housekeeping? N -  Some recent data might be hidden    Patient Care Team: Gwenlyn FudgeJoyce, Britney F, FNP as PCP - General (Family Medicine) Micki RileySethi, Pramod S, MD as Consulting Physician (Neurology)  Indicate any recent Medical Services you may have received from other than [facility] providers in the past year (date may be approximate).     Assessment:   This is a routine wellness examination for Ajit.  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 Prevent falls        Depression Screen PHQ 2/9 Scores 07/30/2020 07/24/2020 04/23/2020 01/22/2020 10/17/2019 12/26/2012  PHQ - 2 Score 0 0 0 0 0 0  PHQ- 9 Score 0 0 - - - -    Fall Risk Fall Risk  07/30/2020 07/24/2020 04/23/2020 01/22/2020 10/17/2019  Falls in the past year? 0 0 0 0 0  Number falls in past yr: 0 - - - -  Injury with Fall? 0 - - - -  Risk for fall due to : Impaired balance/gait;Orthopedic patient;Other (Comment) - - - -  Risk for fall due to: Comment hx of stroke - - - -  Follow up Falls prevention discussed;Education provided - - - -    FALL RISK PREVENTION PERTAINING TO THE HOME:  Any stairs in or around the home?  Yes  If so, are there any without handrails? No  Home free of loose throw rugs in walkways, pet beds, electrical cords, etc? Yes  Adequate lighting in your home to reduce risk of falls? Yes   ASSISTIVE DEVICES UTILIZED TO PREVENT FALLS:  Life alert? No  Use of a cane, walker or w/c? Yes  Grab bars in the bathroom? Yes  Shower chair or bench in shower? Yes  Elevated toilet seat or a handicapped toilet? Yes   TIMED UP AND GO:  Was the test performed? No . Telephonic visit.  Cognitive Function:     6CIT Screen 07/30/2020  What Year? 0 points  What month? 0 points  What time? 0 points  Count back from 20 0 points  Months in reverse 2 points  Repeat phrase 4 points  Total Score 6    Immunizations Immunization History  Administered Date(s) Administered  . Fluad Quad(high Dose 65+) 01/22/2020  . Influenza,inj,Quad PF,6+ Mos 12/26/2012  . Pneumococcal Conjugate-13 10/17/2019  . Pneumococcal Polysaccharide-23 12/26/2012    TDAP status: Due, Education has been provided regarding the importance of this vaccine. Advised may receive this vaccine at local pharmacy or Health Dept. Aware to provide a copy of the vaccination record if obtained from local pharmacy or Health Dept. Verbalized acceptance and understanding.  Flu Vaccine status: Up to date  Pneumococcal vaccine status: Up to date  Covid-19 vaccine status: Declined, Education has been provided regarding the importance of this vaccine but patient still declined. Advised may receive this vaccine at local pharmacy or Health Dept.or vaccine clinic. Aware to provide a copy of the vaccination record if obtained from local pharmacy or Health Dept. Verbalized acceptance and understanding.  Qualifies for Shingles Vaccine? No   Zostavax completed No   Shingrix Completed?: No.    Education has been provided regarding the importance of this vaccine. Patient has been advised to call insurance company to determine out of pocket expense if  they have not yet received this vaccine. Advised may also receive vaccine at local pharmacy or Health Dept. Verbalized acceptance and understanding.  Screening Tests Health Maintenance  Topic Date Due  .  Fecal DNA (Cologuard)  Never done  . COVID-19 Vaccine (1) 08/09/2020 (Originally 08/11/1950)  . TETANUS/TDAP  10/16/2020 (Originally 08/10/1964)  . Hepatitis C Screening  10/16/2020 (Originally 08/10/1963)  . Zoster Vaccines- Shingrix (1 of 2) 10/24/2020 (Originally 08/10/1996)  . INFLUENZA VACCINE  09/23/2020  . PNA vac Low Risk Adult  Completed  . Pneumococcal Vaccine 36-52 Years old  Aged Out  . HPV VACCINES  Aged Out    Health Maintenance  Health Maintenance Due  Topic Date Due  . Fecal DNA (Cologuard)  Never done    Colorectal cancer screening: He has a cologuard test at home - advised to return this soon  Lung Cancer Screening: (Low Dose CT Chest recommended if Age 32-80 years, 30 pack-year currently smoking OR have quit w/in 15years.) does not qualify.  Additional Screening:  Hepatitis C Screening: does qualify; Due at next visit  Vision Screening: Recommended annual ophthalmology exams for early detection of glaucoma and other disorders of the eye. Is the patient up to date with their annual eye exam?  No  Who is the provider or what is the name of the office in which the patient attends annual eye exams? [facility] If pt is not established with a provider, would they like to be referred to a provider to establish care? No .   Dental Screening: Recommended annual dental exams for proper oral hygiene  Community Resource Referral / Chronic Care Management: CRR required this visit?  No   CCM required this visit?  No      Plan:     I have personally reviewed and noted the following in the patient's chart:   . Medical and social history . Use of alcohol, tobacco or illicit drugs  . Current medications and supplements including opioid prescriptions. Patient is not currently  taking opioid prescriptions. . Functional ability and status . Nutritional status . Physical activity . Advanced directives . List of other physicians . Hospitalizations, surgeries, and ER visits in previous 12 months . Vitals . Screenings to include cognitive, depression, and falls . Referrals and appointments  In addition, I have reviewed and discussed with patient certain preventive protocols, quality metrics, and best practice recommendations. A written personalized care plan for preventive services as well as general preventive health recommendations were provided to patient.     [place] Constable, LPN   04/01/348   Nurse Notes: None

## 2020-08-05 ENCOUNTER — Encounter: Payer: Self-pay | Admitting: Family Medicine

## 2020-10-24 ENCOUNTER — Other Ambulatory Visit: Payer: Self-pay | Admitting: Family Medicine

## 2020-10-24 ENCOUNTER — Ambulatory Visit (INDEPENDENT_AMBULATORY_CARE_PROVIDER_SITE_OTHER): Payer: Medicare Other | Admitting: Family Medicine

## 2020-10-24 ENCOUNTER — Other Ambulatory Visit: Payer: Self-pay

## 2020-10-24 ENCOUNTER — Encounter: Payer: Self-pay | Admitting: Family Medicine

## 2020-10-24 VITALS — BP 116/65 | HR 78 | Temp 97.8°F | Ht 67.0 in | Wt 144.0 lb

## 2020-10-24 DIAGNOSIS — Z8673 Personal history of transient ischemic attack (TIA), and cerebral infarction without residual deficits: Secondary | ICD-10-CM | POA: Diagnosis not present

## 2020-10-24 DIAGNOSIS — E785 Hyperlipidemia, unspecified: Secondary | ICD-10-CM | POA: Diagnosis not present

## 2020-10-24 DIAGNOSIS — Z0001 Encounter for general adult medical examination with abnormal findings: Secondary | ICD-10-CM

## 2020-10-24 DIAGNOSIS — H9313 Tinnitus, bilateral: Secondary | ICD-10-CM

## 2020-10-24 DIAGNOSIS — I10 Essential (primary) hypertension: Secondary | ICD-10-CM | POA: Diagnosis not present

## 2020-10-24 DIAGNOSIS — Z0000 Encounter for general adult medical examination without abnormal findings: Secondary | ICD-10-CM

## 2020-10-24 MED ORDER — AMLODIPINE BESYLATE 5 MG PO TABS: 5.0000 mg | ORAL_TABLET | Freq: Every day | ORAL | 1 refills | Status: DC

## 2020-10-24 MED ORDER — LOVASTATIN 40 MG PO TABS: 40.0000 mg | ORAL_TABLET | Freq: Every day | ORAL | 2 refills | Status: DC

## 2020-10-24 MED ORDER — B COMPLEX-C PO TABS: 1.0000 | ORAL_TABLET | Freq: Every day | ORAL | 1 refills | Status: DC

## 2020-10-24 MED ORDER — CLOPIDOGREL BISULFATE 75 MG PO TABS
75.0000 mg | ORAL_TABLET | Freq: Every day | ORAL | 1 refills | Status: DC
Start: 2020-10-24 — End: 2020-11-29

## 2020-10-24 NOTE — Patient Instructions (Signed)
Reminder: please return after mid-September for your flu shot. If you receive this elsewhere, such as your pharmacy, please let us know so we can get this documented in your chart.   Please complete your Cologuard by the end of the month.

## 2020-10-24 NOTE — Progress Notes (Signed)
Assessment & Plan:  1. Well adult exam Preventive health education provided. - CBC with Differential/Platelet - CMP14+EGFR - Lipid panel  2. Essential hypertension, benign Well controlled on current regimen.  - amLODipine (NORVASC) 5 MG tablet; Take 1 tablet (5 mg total) by mouth daily.  Dispense: 90 tablet; Refill: 1 - CBC with Differential/Platelet - CMP14+EGFR - Lipid panel  3. Hyperlipidemia with target LDL less than 100 Unable to tolerate pravastatin or atorvastatin. Trying lovastatin. - lovastatin (MEVACOR) 40 MG tablet; Take 1 tablet (40 mg total) by mouth at bedtime.  Dispense: 30 tablet; Refill: 2 - CMP14+EGFR - Lipid panel  4. History of stroke Unable to tolerate pravastatin or atorvastatin. Trying lovastatin. - clopidogrel (PLAVIX) 75 MG tablet; Take 1 tablet (75 mg total) by mouth daily.  Dispense: 90 tablet; Refill: 1 - lovastatin (MEVACOR) 40 MG tablet; Take 1 tablet (40 mg total) by mouth at bedtime.  Dispense: 30 tablet; Refill: 2  5. Tinnitus of both ears Education provided on tinnitus. Offered referral to audiology which patient declined.   Follow-up: Return in about 4 weeks (around 11/21/2020) for Lovastatin.   Hendricks Limes, MSN, APRN, FNP-C [facility]  Subjective:  Patient ID: Grant Berry, male    DOB: 11-19-1945  Age: 75 y.o. MRN: [id number]  Patient Care Team: Loman Brooklyn, FNP as PCP - General (Family Medicine) Garvin Fila, MD as Consulting Physician (Neurology)   CC:  Chief Complaint  Patient presents with   Annual Exam    HPI Grant Berry presents for his annual physical. He is accompanied by his son, who he is okay with being present.  Occupation: Retired, Marital status: Widowed, Substance use: None Diet: Low sugar, Exercise: Every 3rd day - push-ups, squats, and sit-ups Last eye exam: 2 years ago Last dental exam: 3-4 years ago Last colonoscopy: Never - will complete Cologuard Hepatitis C  Screening: Declined PSA: WNL on 06/17/2012 - Declined today Immunizations: Flu Vaccine:  not in stock Tdap Vaccine: declined  Shingrix Vaccine: declined  COVID-19 Vaccine: declined Pneumonia Vaccine: up to date  DEPRESSION SCREENING PHQ 2/9 Scores 10/24/2020 07/30/2020 07/24/2020 04/23/2020 01/22/2020 10/17/2019 12/26/2012  PHQ - 2 Score 0 0 0 0 0 0 0  PHQ- 9 Score 0 0 0 - - - -     Hyperlipidemia/History of stroke: previously unable to tolerate atorvastatin, so he was switched to pravastatin. He reports he took it for 2 weeks and stopped due to pain.    Review of Systems  Constitutional:  Negative for chills, fever, malaise/fatigue and weight loss.  HENT:  Positive for hearing loss and tinnitus. Negative for congestion, ear discharge, ear pain, nosebleeds, sinus pain and sore throat.   Eyes:  Negative for blurred vision, double vision, pain, discharge and redness.  Respiratory:  Negative for cough, shortness of breath and wheezing.   Cardiovascular:  Negative for chest pain, palpitations and leg swelling.  Gastrointestinal:  Negative for abdominal pain, constipation, diarrhea, heartburn, nausea and vomiting.  Genitourinary:  Negative for dysuria, frequency and urgency.       Denies trouble initiating a urine stream, weak stream, split stream, nocturia, and dribbling.   Musculoskeletal:  Negative for myalgias.  Skin:  Negative for rash.  Neurological:  Negative for dizziness, seizures, weakness and headaches.  Psychiatric/Behavioral:  Negative for depression, substance abuse and suicidal ideas. The patient is not nervous/anxious.     Current Outpatient Medications:    aspirin 325 MG tablet, Take 1 tablet (325  mg total) by mouth daily., Disp: 30 tablet, Rfl: 1   Flaxseed, Linseed, (FLAXSEED OIL) 1000 MG CAPS, Take 2,000 mg by mouth in the morning and at bedtime., Disp: , Rfl:    fluticasone (FLONASE) 50 MCG/ACT nasal spray, Place 2 sprays into both nostrils daily., Disp: 16 g, Rfl: 6    loratadine (CLARITIN) 10 MG tablet, Take 1 tablet (10 mg total) by mouth daily., Disp: 30 tablet, Rfl: 11   lovastatin (MEVACOR) 40 MG tablet, Take 1 tablet (40 mg total) by mouth at bedtime., Disp: 30 tablet, Rfl: 2   Red Yeast Rice 600 MG CAPS, Take 1,200 mg by mouth daily., Disp: , Rfl:    Saw Palmetto 450 MG CAPS, Take 900 mg by mouth daily., Disp: , Rfl:    amLODipine (NORVASC) 5 MG tablet, Take 1 tablet (5 mg total) by mouth daily., Disp: 90 tablet, Rfl: 1   B Complex-C (B-COMPLEX WITH VITAMIN C) tablet, Take 1 tablet by mouth daily., Disp: 90 tablet, Rfl: 1   clopidogrel (PLAVIX) 75 MG tablet, Take 1 tablet (75 mg total) by mouth daily., Disp: 90 tablet, Rfl: 1   ezetimibe (ZETIA) 10 MG tablet, TAKE ONE (1) TABLET BY MOUTH EVERY DAY, Disp: 90 tablet, Rfl: 1  Allergies  Allergen Reactions   Atorvastatin Other (See Comments)    "Feels like crap", muscle pain, & dizziness.   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   Hyperlipidemia    Hypertension     Past Surgical History:  Procedure Laterality Date   EYE SURGERY Bilateral    TONSILECTOMY/ADENOIDECTOMY WITH MYRINGOTOMY      Family History  Problem Relation Age of Onset   Stroke Mother    Coronary artery disease Mother    Coronary artery disease Father    Diabetes Maternal Grandfather     Social History   Socioeconomic History   Marital status: Widowed    Spouse name: Not on file   Number of children: 1   Years of education: Not on file   Highest education level: Not on file  Occupational History   Occupation: retired   Tobacco Use   Smoking status: Former    Types: Cigarettes    Quit date: 05/24/1988    Years since quitting: 32.4   Smokeless tobacco: Never  Vaping Use   Vaping Use: Never used  Substance and Sexual Activity   Alcohol use: No   Drug use: No   Sexual activity: Not on file  Other Topics Concern   Not on file   Social History Narrative   Live at home = son stays there    Right Handed   Drinks 1-2 cups of caffeine/daily   Stroke in 2021, 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 and has to use a walker   Social Determinants of Health   Financial Resource Strain: Low Risk    Difficulty of Paying Living Expenses: Not hard at all  Food Insecurity: No Food Insecurity   Worried About Charity fundraiser in the Last Year: Never true   [place] in the Last Year: Never true  Transportation Needs: No Transportation Needs   Lack of Transportation (Medical): No   Lack of Transportation (Non-Medical): No  Physical Activity: Insufficiently Active   Days of Exercise per Week: 7 days   Minutes of Exercise per Session: 20 min  Stress: No Stress Concern Present  Feeling of Stress : Not at all  Social Connections: Socially Isolated   Frequency of Communication with Friends and Family: More than three times a week   Frequency of Social Gatherings with Friends and Family: Once a week   Attends Religious Services: Never   Marine scientist or Organizations: No   Attends Archivist Meetings: Never   Marital Status: Widowed  Human resources officer Violence: Not At Risk   Fear of Current or Ex-Partner: No   Emotionally Abused: No   Physically Abused: No   Sexually Abused: No      Objective:    BP 116/65   Pulse 78   Temp 97.8 F (36.6 C) (Temporal)   Ht 5' 7"  (1.702 m)   Wt 144 lb (65.3 kg)   SpO2 95%   BMI 22.55 kg/m   Wt Readings from Last 3 Encounters:  10/24/20 144 lb (65.3 kg)  07/30/20 149 lb (67.6 kg)  07/24/20 149 lb 6.4 oz (67.8 kg)    Physical Exam Vitals reviewed.  Constitutional:      General: He is not in acute distress.    Appearance: Normal appearance. He is not ill-appearing, toxic-appearing or diaphoretic.  HENT:     Head: Normocephalic and atraumatic.     Right Ear: Tympanic membrane, ear canal and external ear normal. There is no impacted cerumen.     Left  Ear: Tympanic membrane, ear canal and external ear normal. There is no impacted cerumen.     Nose: Nose normal. No congestion or rhinorrhea.     Mouth/Throat:     Mouth: Mucous membranes are moist.     Pharynx: Oropharynx is clear. No oropharyngeal exudate or posterior oropharyngeal erythema.  Eyes:     General: No scleral icterus.       Right eye: No discharge.        Left eye: No discharge.     Conjunctiva/sclera: Conjunctivae normal.     Pupils: Pupils are equal, round, and reactive to light.  Neck:     Vascular: No carotid bruit.  Cardiovascular:     Rate and Rhythm: Normal rate and regular rhythm.     Heart sounds: Normal heart sounds. No murmur heard.   No friction rub. No gallop.  Pulmonary:     Effort: Pulmonary effort is normal. No respiratory distress.     Breath sounds: Normal breath sounds. No stridor. No wheezing, rhonchi or rales.  Abdominal:     General: Abdomen is flat. Bowel sounds are normal. There is no distension.     Palpations: Abdomen is soft. There is no hepatomegaly, splenomegaly or mass.     Tenderness: There is no abdominal tenderness. There is no guarding or rebound.     Hernia: No hernia is present.  Musculoskeletal:        General: Normal range of motion.     Cervical back: Normal range of motion and neck supple. No rigidity. No muscular tenderness.     Right lower leg: No edema.     Left lower leg: No edema.  Lymphadenopathy:     Cervical: No cervical adenopathy.  Skin:    General: Skin is warm and dry.     Capillary Refill: Capillary refill takes less than 2 seconds.  Neurological:     General: No focal deficit present.     Mental Status: He is alert and oriented to person, place, and time. Mental status is at baseline.     Gait: Gait abnormal (  ambulates with rolling walker).  Psychiatric:        Mood and Affect: Mood normal.        Behavior: Behavior normal.        Thought Content: Thought content normal.        Judgment: Judgment normal.     No results found for: TSH Lab Results  Component Value Date   WBC 6.7 01/22/2020   HGB 14.8 01/22/2020   HCT 43.8 01/22/2020   MCV 87 01/22/2020   PLT 219 01/22/2020   Lab Results  Component Value Date   NA 144 07/24/2020   K 4.3 07/24/2020   CO2 25 07/24/2020   GLUCOSE 88 07/24/2020   BUN 13 07/24/2020   CREATININE 1.18 07/24/2020   BILITOT 0.5 07/24/2020   ALKPHOS 67 07/24/2020   AST 19 07/24/2020   ALT 11 07/24/2020   PROT 7.3 07/24/2020   ALBUMIN 4.7 07/24/2020   CALCIUM 9.9 07/24/2020   ANIONGAP 10 09/02/2019   EGFR 65 07/24/2020   Lab Results  Component Value Date   CHOL 224 (H) 07/24/2020   Lab Results  Component Value Date   HDL 42 07/24/2020   Lab Results  Component Value Date   LDLCALC 160 (H) 07/24/2020   Lab Results  Component Value Date   TRIG 119 07/24/2020   Lab Results  Component Value Date   CHOLHDL 5.3 (H) 07/24/2020   Lab Results  Component Value Date   HGBA1C 5.6 09/02/2019

## 2020-11-29 ENCOUNTER — Encounter: Payer: Self-pay | Admitting: Family Medicine

## 2020-11-29 ENCOUNTER — Other Ambulatory Visit: Payer: Self-pay

## 2020-11-29 ENCOUNTER — Ambulatory Visit (INDEPENDENT_AMBULATORY_CARE_PROVIDER_SITE_OTHER): Payer: Medicare Other | Admitting: Family Medicine

## 2020-11-29 VITALS — BP 120/58 | HR 75 | Temp 97.9°F | Ht 67.0 in | Wt 142.8 lb

## 2020-11-29 DIAGNOSIS — E785 Hyperlipidemia, unspecified: Secondary | ICD-10-CM

## 2020-11-29 DIAGNOSIS — Z23 Encounter for immunization: Secondary | ICD-10-CM

## 2020-11-29 DIAGNOSIS — Z8673 Personal history of transient ischemic attack (TIA), and cerebral infarction without residual deficits: Secondary | ICD-10-CM | POA: Diagnosis not present

## 2020-11-29 DIAGNOSIS — I10 Essential (primary) hypertension: Secondary | ICD-10-CM | POA: Diagnosis not present

## 2020-11-29 DIAGNOSIS — Z0000 Encounter for general adult medical examination without abnormal findings: Secondary | ICD-10-CM | POA: Diagnosis not present

## 2020-11-29 MED ORDER — LOVASTATIN 40 MG PO TABS: 40.0000 mg | ORAL_TABLET | Freq: Every day | ORAL | 2 refills | Status: DC

## 2020-11-29 NOTE — Progress Notes (Signed)
Assessment & Plan:  1. Hyperlipidemia with target LDL less than 100 Tolerating Lovastatin.  - lovastatin (MEVACOR) 40 MG tablet; Take 1 tablet (40 mg total) by mouth at bedtime.  Dispense: 90 tablet; Refill: 2  2. History of stroke Tolerating Lovastatin. Managed by neurology who D/C'd Plavix.  - lovastatin (MEVACOR) 40 MG tablet; Take 1 tablet (40 mg total) by mouth at bedtime.  Dispense: 90 tablet; Refill: 2  3. Need for immunization against influenza Influenza vaccine given in office today.   Return in about 6 months (around 05/30/2021) for follow-up of chronic medication conditions.  Hendricks Limes, MSN, APRN, FNP-C [facility]  Subjective:    Patient ID: Grant Berry, male    DOB: 01-07-1946, 75 y.o.   MRN: [id number]  Patient Care Team: Loman Brooklyn, FNP as PCP - General (Family Medicine) Garvin Fila, MD as Consulting Physician (Neurology)   Chief Complaint:  Chief Complaint  Patient presents with   medication check     4 week follow up     HPI: Grant Berry is a 75 y.o. male presenting on 11/29/2020 for medication check  (4 week follow up )  Patient is accompanied by his son, who he is okay with being present.  He is following up on starting Lovastatin. He previously failed therapy with Pravastatin and Atorvastatin. He reports he is tolerating Lovastatin well with no pain.  Patient reports his neurologist discontinued Plavix and told him to just take aspirin.   New complaints: None   Social history:  Relevant past medical, surgical, family and social history reviewed and updated as indicated. Interim medical history since our last visit reviewed.  Allergies and medications reviewed and updated.  DATA REVIEWED: CHART IN EPIC  ROS: Negative unless specifically indicated above in HPI.    Current Outpatient Medications:    amLODipine (NORVASC) 5 MG tablet, Take 1 tablet (5 mg total) by mouth daily., Disp: 90 tablet,  Rfl: 1   aspirin 325 MG tablet, Take 1 tablet (325 mg total) by mouth daily., Disp: 30 tablet, Rfl: 1   B Complex-C (B-COMPLEX WITH VITAMIN C) tablet, Take 1 tablet by mouth daily., Disp: 90 tablet, Rfl: 1   ezetimibe (ZETIA) 10 MG tablet, TAKE ONE (1) TABLET BY MOUTH EVERY DAY, Disp: 90 tablet, Rfl: 1   Flaxseed, Linseed, (FLAXSEED OIL) 1000 MG CAPS, Take 2,000 mg by mouth in the morning and at bedtime., Disp: , Rfl:    fluticasone (FLONASE) 50 MCG/ACT nasal spray, Place 2 sprays into both nostrils daily., Disp: 16 g, Rfl: 6   loratadine (CLARITIN) 10 MG tablet, Take 1 tablet (10 mg total) by mouth daily., Disp: 30 tablet, Rfl: 11   lovastatin (MEVACOR) 40 MG tablet, Take 1 tablet (40 mg total) by mouth at bedtime., Disp: 30 tablet, Rfl: 2   Red Yeast Rice 600 MG CAPS, Take 1,200 mg by mouth daily., Disp: , Rfl:    Saw Palmetto 450 MG CAPS, Take 900 mg by mouth daily., Disp: , Rfl:    clopidogrel (PLAVIX) 75 MG tablet, Take 1 tablet (75 mg total) by mouth daily. (Patient not taking: Reported on 11/29/2020), Disp: 90 tablet, Rfl: 1   Allergies  Allergen Reactions   Atorvastatin Other (See Comments)    "Feels like crap", muscle pain, & dizziness.   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   Hyperlipidemia  Hypertension     Past Surgical History:  Procedure Laterality Date   EYE SURGERY Bilateral    TONSILECTOMY/ADENOIDECTOMY WITH MYRINGOTOMY      Social History   Socioeconomic History   Marital status: Widowed    Spouse name: Not on file   Number of children: 1   Years of education: Not on file   Highest education level: Not on file  Occupational History   Occupation: retired   Tobacco Use   Smoking status: Former    Types: Cigarettes    Quit date: 05/24/1988    Years since quitting: 32.5   Smokeless tobacco: Never  Vaping Use   Vaping Use: Never used  Substance and Sexual Activity   Alcohol  use: No   Drug use: No   Sexual activity: Not on file  Other Topics Concern   Not on file  Social History Narrative   Live at home = son stays there    Right Handed   Drinks 1-2 cups of caffeine/daily   Stroke in 2021, 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 and has to use a walker   Social Determinants of Health   Financial Resource Strain: Low Risk    Difficulty of Paying Living Expenses: Not hard at all  Food Insecurity: No Food Insecurity   Worried About Charity fundraiser in the Last Year: Never true   [place] in the Last Year: Never true  Transportation Needs: No Transportation Needs   Lack of Transportation (Medical): No   Lack of Transportation (Non-Medical): No  Physical Activity: Insufficiently Active   Days of Exercise per Week: 7 days   Minutes of Exercise per Session: 20 min  Stress: No Stress Concern Present   Feeling of Stress : Not at all  Social Connections: Socially Isolated   Frequency of Communication with Friends and Family: More than three times a week   Frequency of Social Gatherings with Friends and Family: Once a week   Attends Religious Services: Never   Marine scientist or Organizations: No   Attends Archivist Meetings: Never   Marital Status: Widowed  Human resources officer Violence: Not At Risk   Fear of Current or Ex-Partner: No   Emotionally Abused: No   Physically Abused: No   Sexually Abused: No        Objective:    BP (!) 120/58   Pulse 75   Temp 97.9 F (36.6 C) (Temporal)   Ht 5' 7"  (1.702 m)   Wt 142 lb 12.8 oz (64.8 kg)   SpO2 100%   BMI 22.37 kg/m   Wt Readings from Last 3 Encounters:  11/29/20 142 lb 12.8 oz (64.8 kg)  10/24/20 144 lb (65.3 kg)  07/30/20 149 lb (67.6 kg)    Physical Exam Vitals reviewed.  Constitutional:      General: He is not in acute distress.    Appearance: Normal appearance. He is normal weight. He is not ill-appearing, toxic-appearing or diaphoretic.  HENT:     Head:  Normocephalic and atraumatic.  Eyes:     General: No scleral icterus.       Right eye: No discharge.        Left eye: No discharge.     Conjunctiva/sclera: Conjunctivae normal.  Cardiovascular:     Rate and Rhythm: Normal rate and regular rhythm.     Heart sounds: Normal heart sounds. No murmur heard.   No friction rub. No gallop.  Pulmonary:  Effort: Pulmonary effort is normal. No respiratory distress.     Breath sounds: Normal breath sounds. No stridor. No wheezing, rhonchi or rales.  Musculoskeletal:        General: Normal range of motion.     Cervical back: Normal range of motion.  Skin:    General: Skin is warm and dry.  Neurological:     Mental Status: He is alert and oriented to person, place, and time. Mental status is at baseline.     Gait: Gait abnormal (walks with rolling walker).  Psychiatric:        Mood and Affect: Mood normal.        Behavior: Behavior normal.        Thought Content: Thought content normal.        Judgment: Judgment normal.    No results found for: TSH Lab Results  Component Value Date   WBC 6.7 01/22/2020   HGB 14.8 01/22/2020   HCT 43.8 01/22/2020   MCV 87 01/22/2020   PLT 219 01/22/2020   Lab Results  Component Value Date   NA 144 07/24/2020   K 4.3 07/24/2020   CO2 25 07/24/2020   GLUCOSE 88 07/24/2020   BUN 13 07/24/2020   CREATININE 1.18 07/24/2020   BILITOT 0.5 07/24/2020   ALKPHOS 67 07/24/2020   AST 19 07/24/2020   ALT 11 07/24/2020   PROT 7.3 07/24/2020   ALBUMIN 4.7 07/24/2020   CALCIUM 9.9 07/24/2020   ANIONGAP 10 09/02/2019   EGFR 65 07/24/2020   Lab Results  Component Value Date   CHOL 224 (H) 07/24/2020   Lab Results  Component Value Date   HDL 42 07/24/2020   Lab Results  Component Value Date   LDLCALC 160 (H) 07/24/2020   Lab Results  Component Value Date   TRIG 119 07/24/2020   Lab Results  Component Value Date   CHOLHDL 5.3 (H) 07/24/2020   Lab Results  Component Value Date   HGBA1C 5.6  09/02/2019

## 2020-11-30 LAB — CMP14+EGFR
ALT: 16 IU/L (ref 0–44)
AST: 19 IU/L (ref 0–40)
Albumin/Globulin Ratio: 1.8 (ref 1.2–2.2)
Albumin: 4.5 g/dL (ref 3.7–4.7)
Alkaline Phosphatase: 57 IU/L (ref 44–121)
BUN/Creatinine Ratio: 12 (ref 10–24)
BUN: 14 mg/dL (ref 8–27)
Bilirubin Total: 0.4 mg/dL (ref 0.0–1.2)
CO2: 25 mmol/L (ref 20–29)
Calcium: 9.7 mg/dL (ref 8.6–10.2)
Chloride: 104 mmol/L (ref 96–106)
Creatinine, Ser: 1.2 mg/dL (ref 0.76–1.27)
Globulin, Total: 2.5 g/dL (ref 1.5–4.5)
Glucose: 89 mg/dL (ref 70–99)
Potassium: 4.7 mmol/L (ref 3.5–5.2)
Sodium: 142 mmol/L (ref 134–144)
Total Protein: 7 g/dL (ref 6.0–8.5)
eGFR: 63 mL/min/{1.73_m2} (ref >59)

## 2020-11-30 LAB — LIPID PANEL
Chol/HDL Ratio: 3.6 ratio (ref 0.0–5.0)
Cholesterol, Total: 162 mg/dL (ref 100–199)
HDL: 45 mg/dL (ref >39)
LDL Chol Calc (NIH): 99 mg/dL (ref 0–99)
Triglycerides: 95 mg/dL (ref 0–149)
VLDL Cholesterol Cal: 18 mg/dL (ref 5–40)

## 2020-11-30 LAB — CBC WITH DIFFERENTIAL/PLATELET
Basophils Absolute: 0.1 10*3/uL (ref 0.0–0.2)
Basos: 1 %
EOS (ABSOLUTE): 0.2 10*3/uL (ref 0.0–0.4)
Eos: 3 %
Hematocrit: 43.1 % (ref 37.5–51.0)
Hemoglobin: 14.2 g/dL (ref 13.0–17.7)
Immature Grans (Abs): 0 10*3/uL (ref 0.0–0.1)
Immature Granulocytes: 0 %
Lymphocytes Absolute: 1.8 10*3/uL (ref 0.7–3.1)
Lymphs: 34 %
MCH: 29 pg (ref 26.6–33.0)
MCHC: 32.9 g/dL (ref 31.5–35.7)
MCV: 88 fL (ref 79–97)
Monocytes Absolute: 0.6 10*3/uL (ref 0.1–0.9)
Monocytes: 11 %
Neutrophils Absolute: 2.6 10*3/uL (ref 1.4–7.0)
Neutrophils: 51 %
Platelets: 209 10*3/uL (ref 150–450)
RBC: 4.9 x10E6/uL (ref 4.14–5.80)
RDW: 13.6 % (ref 11.6–15.4)
WBC: 5.2 10*3/uL (ref 3.4–10.8)

## 2020-12-03 ENCOUNTER — Encounter: Payer: Self-pay | Admitting: Family Medicine

## 2021-05-29 ENCOUNTER — Ambulatory Visit (INDEPENDENT_AMBULATORY_CARE_PROVIDER_SITE_OTHER): Payer: Medicare Other | Admitting: Family Medicine

## 2021-05-29 ENCOUNTER — Encounter: Payer: Self-pay | Admitting: Family Medicine

## 2021-05-29 VITALS — BP 162/92 | HR 78 | Temp 98.2°F | Ht 67.0 in | Wt 142.6 lb

## 2021-05-29 DIAGNOSIS — E785 Hyperlipidemia, unspecified: Secondary | ICD-10-CM | POA: Diagnosis not present

## 2021-05-29 DIAGNOSIS — I10 Essential (primary) hypertension: Secondary | ICD-10-CM

## 2021-05-29 MED ORDER — B COMPLEX-C PO TABS: 1.0000 | ORAL_TABLET | Freq: Every day | ORAL | 1 refills | Status: AC

## 2021-05-29 MED ORDER — LISINOPRIL 10 MG PO TABS: 10.0000 mg | ORAL_TABLET | Freq: Every day | ORAL | 2 refills | Status: DC

## 2021-05-29 NOTE — Progress Notes (Signed)
? ?Assessment & Plan:  ?1. Essential hypertension, benign ?Previously controlled with amlodipine but due to side effects it is being discontinued. Starting Lisinopril daily. Patient to monitor BP at home and keep a log. ?- lisinopril (ZESTRIL) 10 MG tablet; Take 1 tablet (10 mg total) by mouth daily.  Dispense: 30 tablet; Refill: 2 ? ?2. Hyperlipidemia with target LDL less than 100 ?Well controlled on current regimen.  ? ? ?Return in about 4 weeks (around 06/26/2021) for HTN. ? ?Hendricks Limes, MSN, APRN, FNP-C ?[place] ? ?Subjective:  ? ? Patient ID: Grant Berry, male    DOB: 1945-07-21, 76 y.o.   MRN: [id number] ? ?Patient Care Team: ?Loman Brooklyn, FNP as PCP - General (Family Medicine) ?Garvin Fila, MD as Consulting Physician (Neurology)  ? ?Chief Complaint:  ?Chief Complaint  ?Patient presents with  ? Medical Management of Chronic Issues  ? ? ?HPI: ?Grant Berry is a 76 y.o. male presenting on 05/29/2021 for Medical Management of Chronic Issues ? ?Patient is accompanied by his son, who he is okay with being present. ? ?Hypertension: has been taking amlodipine 5 mg which was controlling his blood pressure. He ran out a week ago and reports chronic dizziness resolved when he was off the medication.  ? ?Hyperlipidemia: He previously failed therapy with Pravastatin and Atorvastatin. He reports he is tolerating Lovastatin well with no pain. ? ?History of Stroke: Patient reports his neurologist discontinued Plavix and told him to just take aspirin.  ? ?New complaints: ?None ? ? ?Social history: ? ?Relevant past medical, surgical, family and social history reviewed and updated as indicated. Interim medical history since our last visit reviewed. ? ?Allergies and medications reviewed and updated. ? ?DATA REVIEWED: CHART IN EPIC ? ?ROS: Negative unless specifically indicated above in HPI.  ? ? ?Current Outpatient Medications:  ?  aspirin 325 MG tablet, Take 1 tablet (325 mg total)  by mouth daily., Disp: 30 tablet, Rfl: 1 ?  B Complex-C (B-COMPLEX WITH VITAMIN C) tablet, Take 1 tablet by mouth daily., Disp: 90 tablet, Rfl: 1 ?  ezetimibe (ZETIA) 10 MG tablet, TAKE ONE (1) TABLET BY MOUTH EVERY DAY, Disp: 90 tablet, Rfl: 1 ?  lovastatin (MEVACOR) 40 MG tablet, Take 1 tablet (40 mg total) by mouth at bedtime., Disp: 90 tablet, Rfl: 2 ?  Red Yeast Rice 600 MG CAPS, Take 1,200 mg by mouth daily., Disp: , Rfl:  ?  amLODipine (NORVASC) 5 MG tablet, Take 1 tablet (5 mg total) by mouth daily. (Patient not taking: Reported on 05/29/2021), Disp: 90 tablet, Rfl: 1 ?  Flaxseed, Linseed, (FLAXSEED OIL) 1000 MG CAPS, Take 2,000 mg by mouth in the morning and at bedtime. (Patient not taking: Reported on 05/29/2021), Disp: , Rfl:  ?  fluticasone (FLONASE) 50 MCG/ACT nasal spray, Place 2 sprays into both nostrils daily. (Patient not taking: Reported on 05/29/2021), Disp: 16 g, Rfl: 6 ?  loratadine (CLARITIN) 10 MG tablet, Take 1 tablet (10 mg total) by mouth daily. (Patient not taking: Reported on 05/29/2021), Disp: 30 tablet, Rfl: 11 ?  Saw Palmetto 450 MG CAPS, Take 900 mg by mouth daily. (Patient not taking: Reported on 05/29/2021), Disp: , Rfl:   ? ?Allergies  ?Allergen Reactions  ? Atorvastatin Other (See Comments)  ?  "Feels like crap", muscle pain, & dizziness.  ? Pravastatin Other (See Comments)  ?  Pain  ? ?Past Medical History:  ?Diagnosis Date  ? 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  Hyperlipidemia   ? Hypertension   ?  ?Past Surgical History:  ?Procedure Laterality Date  ? EYE SURGERY Bilateral   ? TONSILECTOMY/ADENOIDECTOMY WITH MYRINGOTOMY    ?  ?Social History  ? ?Socioeconomic History  ? Marital status: Widowed  ?  Spouse name: Not on file  ? Number of children: 1  ? Years of education: Not on file  ? Highest education level: Not on file  ?Occupational History  ? Occupation: retired   ?Tobacco Use  ? Smoking status: Former  ?  Types: Cigarettes  ?  Quit date: 05/24/1988  ?  Years since quitting: 33.0  ?  Smokeless tobacco: Never  ?Vaping Use  ? Vaping Use: Never used  ?Substance and Sexual Activity  ? Alcohol use: No  ? Drug use: No  ? Sexual activity: Not on file  ?Other Topics Concern  ? Not on file  ?Social History Narrative  ? Live at home = son stays there   ? Right Handed  ? Drinks 1-2 cups of caffeine/daily  ? Stroke in 2021, 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 and has to use a walker  ? ?Social Determinants of Health  ? ?Financial Resource Strain: Low Risk   ? Difficulty of Paying Living Expenses: Not hard at all  ?Food Insecurity: No Food Insecurity  ? Worried About Charity fundraiser in the Last Year: Never true  ? Ran Out of Food in the Last Year: Never true  ?Transportation Needs: No Transportation Needs  ? Lack of Transportation (Medical): No  ? Lack of Transportation (Non-Medical): No  ?Physical Activity: Insufficiently Active  ? Days of Exercise per Week: 7 days  ? Minutes of Exercise per Session: 20 min  ?Stress: No Stress Concern Present  ? Feeling of Stress : Not at all  ?Social Connections: Socially Isolated  ? Frequency of Communication with Friends and Family: More than three times a week  ? Frequency of Social Gatherings with Friends and Family: Once a week  ? Attends Religious Services: Never  ? Active Member of Clubs or Organizations: No  ? Attends Archivist Meetings: Never  ? Marital Status: Widowed  ?Intimate Partner Violence: Not At Risk  ? Fear of Current or Ex-Partner: No  ? Emotionally Abused: No  ? Physically Abused: No  ? Sexually Abused: No  ?  ? ?   ?Objective:  ?  ?BP (!) 162/92   Pulse 78   Temp 98.2 ?F (36.8 ?C) (Temporal)   Ht 5' 7"  (1.702 m)   Wt 142 lb 9.6 oz (64.7 kg)   SpO2 98%   BMI 22.33 kg/m?  ? ?Wt Readings from Last 3 Encounters:  ?05/29/21 142 lb 9.6 oz (64.7 kg)  ?11/29/20 142 lb 12.8 oz (64.8 kg)  ?10/24/20 144 lb (65.3 kg)  ? ? ?Physical Exam ?Vitals reviewed.  ?Constitutional:   ?   General: He is not in acute distress. ?   Appearance: Normal  appearance. He is normal weight. He is not ill-appearing, toxic-appearing or diaphoretic.  ?HENT:  ?   Head: Normocephalic and atraumatic.  ?Eyes:  ?   General: No scleral icterus.    ?   Right eye: No discharge.     ?   Left eye: No discharge.  ?   Conjunctiva/sclera: Conjunctivae normal.  ?Cardiovascular:  ?   Rate and Rhythm: Normal rate and regular rhythm.  ?   Heart sounds: Normal heart sounds. No murmur heard. ?  No friction rub. No  gallop.  ?Pulmonary:  ?   Effort: Pulmonary effort is normal. No respiratory distress.  ?   Breath sounds: Normal breath sounds. No stridor. No wheezing, rhonchi or rales.  ?Musculoskeletal:     ?   General: Normal range of motion.  ?   Cervical back: Normal range of motion.  ?Skin: ?   General: Skin is warm and dry.  ?Neurological:  ?   Mental Status: He is alert and oriented to person, place, and time. Mental status is at baseline.  ?   Gait: Gait abnormal (walks with rolling walker).  ?Psychiatric:     ?   Mood and Affect: Mood normal.     ?   Behavior: Behavior normal.     ?   Thought Content: Thought content normal.     ?   Judgment: Judgment normal.  ? ? ?No results found for: TSH ?Lab Results  ?Component Value Date  ? WBC 5.2 11/29/2020  ? HGB 14.2 11/29/2020  ? HCT 43.1 11/29/2020  ? MCV 88 11/29/2020  ? PLT 209 11/29/2020  ? ?Lab Results  ?Component Value Date  ? NA 142 11/29/2020  ? K 4.7 11/29/2020  ? CO2 25 11/29/2020  ? GLUCOSE 89 11/29/2020  ? BUN 14 11/29/2020  ? CREATININE 1.20 11/29/2020  ? BILITOT 0.4 11/29/2020  ? ALKPHOS 57 11/29/2020  ? AST 19 11/29/2020  ? ALT 16 11/29/2020  ? PROT 7.0 11/29/2020  ? ALBUMIN 4.5 11/29/2020  ? CALCIUM 9.7 11/29/2020  ? ANIONGAP 10 09/02/2019  ? EGFR 63 11/29/2020  ? ?Lab Results  ?Component Value Date  ? CHOL 162 11/29/2020  ? ?Lab Results  ?Component Value Date  ? HDL 45 11/29/2020  ? ?Lab Results  ?Component Value Date  ? [place] 99 11/29/2020  ? ?Lab Results  ?Component Value Date  ? TRIG 95 11/29/2020  ? ?Lab Results   ?Component Value Date  ? CHOLHDL 3.6 11/29/2020  ? ?Lab Results  ?Component Value Date  ? HGBA1C 5.6 09/02/2019  ? ? ?   ? ? ? ? ? ?

## 2021-06-26 ENCOUNTER — Encounter: Payer: Self-pay | Admitting: Family Medicine

## 2021-06-26 ENCOUNTER — Ambulatory Visit (INDEPENDENT_AMBULATORY_CARE_PROVIDER_SITE_OTHER): Payer: Medicare Other | Admitting: Family Medicine

## 2021-06-26 VITALS — BP 148/78

## 2021-06-26 DIAGNOSIS — I10 Essential (primary) hypertension: Secondary | ICD-10-CM | POA: Diagnosis not present

## 2021-06-26 MED ORDER — AMLODIPINE BESYLATE 5 MG PO TABS: 5.0000 mg | ORAL_TABLET | Freq: Every day | ORAL | 1 refills | Status: DC

## 2021-06-26 NOTE — Progress Notes (Signed)
? ?Virtual Visit via Telephone Note ? ?I connected with Grant Berry on 06/26/21 at 9:30 AM by telephone and verified that I am speaking with the correct person using two identifiers. Grant Berry is currently located at home and his son is currently with him during this visit. The provider, Loman Brooklyn, FNP is located in their home at time of visit. ? ?I discussed the limitations, risks, security and privacy concerns of performing an evaluation and management service by telephone and the availability of in person appointments. I also discussed with the patient that there may be a patient responsible charge related to this service. The patient expressed understanding and agreed to proceed. ? ?Subjective: ?PCP: Loman Brooklyn, FNP ? ?Chief Complaint  ?Patient presents with  ? Hypertension  ? ?Patient was started on lisinopril 4 weeks ago after experiencing side effects to amlodipine.  He reports today he still feels dizzy with lisinopril and feels like he did better on amlodipine.  He has been monitoring his blood pressure at home and ranges between 110s-140s/60-70s a.  Heart rate ranges 60-80.  He reports he has been dizzy his whole life. ? ? ?ROS: Per HPI ? ?Current Outpatient Medications:  ?  aspirin 325 MG tablet, Take 1 tablet (325 mg total) by mouth daily., Disp: 30 tablet, Rfl: 1 ?  B Complex-C (B-COMPLEX WITH VITAMIN C) tablet, Take 1 tablet by mouth daily., Disp: 90 tablet, Rfl: 1 ?  KRILL OIL PO, Take by mouth daily., Disp: , Rfl:  ?  lisinopril (ZESTRIL) 10 MG tablet, Take 1 tablet (10 mg total) by mouth daily., Disp: 30 tablet, Rfl: 2 ?  lovastatin (MEVACOR) 40 MG tablet, Take 1 tablet (40 mg total) by mouth at bedtime., Disp: 90 tablet, Rfl: 2 ? ?Allergies  ?Allergen Reactions  ? Atorvastatin Other (See Comments)  ?  "Feels like crap", muscle pain, & dizziness.  ? Pravastatin Other (See Comments)  ?  Pain  ? ?Past Medical History:  ?Diagnosis Date  ? 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 Hyperlipidemia   ? Hypertension   ? ? ?Observations/Objective: ?Today's Vitals  ? 06/26/21 0945  ?BP: (!) 148/78  ? ?There is no height or weight on file to calculate BMI. ? ?A&O  ?No respiratory distress or wheezing audible over the phone ?Mood, judgement, and thought processes all WNL ? ?Assessment and Plan: ?1. Essential hypertension, benign ?Well controlled on current regimen.  Patient would like to return to amlodipine, as he feels he was better on it than lisinopril. ?- amLODipine (NORVASC) 5 MG tablet; Take 1 tablet (5 mg total) by mouth daily.  Dispense: 90 tablet; Refill: 1 ? ?Follow Up Instructions: ?Return in about 6 months (around 12/27/2021) for annual physical. ? ?I discussed the assessment and treatment plan with the patient. The patient was provided an opportunity to ask questions and all were answered. The patient agreed with the plan and demonstrated an understanding of the instructions. ?  ?The patient was advised to call back or seek an in-person evaluation if the symptoms worsen or if the condition fails to improve as anticipated. ? ?The above assessment and management plan was discussed with the patient. The patient verbalized understanding of and has agreed to the management plan. Patient is aware to call the clinic if symptoms persist or worsen. Patient is aware when to return to the clinic for a follow-up visit. Patient educated on when it is appropriate to go to the emergency department.  ? ?Time  call ended: 9:45 AM ? ?I provided 15 minutes of non-face-to-face time during this encounter. ? ?Hendricks Limes, MSN, APRN, FNP-C ?[place] ?06/26/21 ? ? ?

## 2021-07-31 ENCOUNTER — Ambulatory Visit (INDEPENDENT_AMBULATORY_CARE_PROVIDER_SITE_OTHER): Payer: Medicare Other

## 2021-07-31 VITALS — Wt 148.0 lb

## 2021-07-31 DIAGNOSIS — Z0000 Encounter for general adult medical examination without abnormal findings: Secondary | ICD-10-CM | POA: Diagnosis not present

## 2021-07-31 NOTE — Progress Notes (Signed)
Subjective:   Grant Berry is a 76 y.o. male who presents for Medicare Annual/Subsequent preventive examination.  Virtual Visit via Telephone Note  I connected with  Grant Berry on 07/31/21 at  2:00 PM EDT by telephone and verified that I am speaking with the correct person using two identifiers.  Location: Patient: home Provider: [facility] Persons participating in the virtual visit: patient/Nurse Health Advisor   I discussed the limitations, risks, security and privacy concerns of performing an evaluation and management service by telephone and the availability of in person appointments. The patient expressed understanding and agreed to proceed.  Interactive audio and video telecommunications were attempted between this nurse and patient, however failed, due to patient having technical difficulties OR patient did not have access to video capability.  We continued and completed visit with audio only.  Some vital signs may be absent or patient reported.   Grant Fentress E Danni Leabo, LPN   Review of Systems     Cardiac Risk Factors include: advanced age (>46men, >73 women);dyslipidemia;hypertension;male gender;Other (see comment), Risk factor comments: hx of stroke     Objective:    Today's Vitals   07/31/21 1416  Weight: 148 lb (67.1 kg)   Body mass index is 23.18 kg/m.     07/31/2021    2:33 PM 07/30/2020    2:31 PM 09/02/2019   11:06 AM  Advanced Directives  Does Patient Have a Medical Advance Directive? Yes No No  Type of Estate agent of [place];Living will    Copy of Healthcare Power of Attorney in Chart? No - copy requested    Would patient like information on creating a medical advance directive?  Yes (MAU/Ambulatory/Procedural Areas - Information given) No - Patient declined    Current Medications (verified) Outpatient Encounter Medications as of 07/31/2021  Medication Sig   amLODipine (NORVASC) 5 MG tablet Take 1 tablet (5 mg total) by mouth  daily.   aspirin 325 MG tablet Take 1 tablet (325 mg total) by mouth daily.   B Complex-C (B-COMPLEX WITH VITAMIN C) tablet Take 1 tablet by mouth daily.   KRILL OIL PO Take by mouth daily.   lovastatin (MEVACOR) 40 MG tablet Take 1 tablet (40 mg total) by mouth at bedtime.   No facility-administered encounter medications on file as of 07/31/2021.    Allergies (verified) Atorvastatin and Pravastatin   History: Past Medical History:  Diagnosis Date   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   Hyperlipidemia    Hypertension    Past Surgical History:  Procedure Laterality Date   EYE SURGERY Bilateral    TONSILECTOMY/ADENOIDECTOMY WITH MYRINGOTOMY     Family History  Problem Relation Age of Onset   Stroke Mother    Coronary artery disease Mother    Coronary artery disease Father    Diabetes Maternal Grandfather    Social History   Socioeconomic History   Marital status: Widowed    Spouse name: Not on file   Number of children: 1   Years of education: Not on file   Highest education level: Not on file  Occupational History   Occupation: retired   Tobacco Use   Smoking status: Former    Types: Cigarettes    Quit date: 05/24/1988    Years since quitting: 33.2   Smokeless tobacco: Never  Vaping Use   Vaping Use: Never used  Substance and Sexual Activity   Alcohol use: No   Drug use: No   Sexual activity: Not on file  Other Topics Concern   Not on file  Social History Narrative   Live at home = son stays there    Right Handed   Drinks 1-2 cups of caffeine/daily   Stroke in 2021, 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 and has to use a walker   Social Determinants of Health   Financial Resource Strain: Low Risk  (07/31/2021)   Overall Financial Resource Strain (CARDIA)    Difficulty of Paying Living Expenses: Not hard at all  Food Insecurity: No Food Insecurity (07/31/2021)   Hunger Vital Sign    Worried About Running Out of Food in the Last Year: Never true    Ran Out of Food in  the Last Year: Never true  Transportation Needs: No Transportation Needs (07/31/2021)   PRAPARE - Administrator, Civil ServiceTransportation    Lack of Transportation (Medical): No    Lack of Transportation (Non-Medical): No  Physical Activity: Insufficiently Active (07/31/2021)   Exercise Vital Sign    Days of Exercise per Week: 4 days    Minutes of Exercise per Session: 30 min  Stress: No Stress Concern Present (07/31/2021)   [employer] - Occupational Stress Questionnaire    Feeling of Stress : Not at all  Social Connections: Socially Isolated (07/31/2021)   Social Connection and Isolation Panel [NHANES]    Frequency of Communication with Friends and Family: More than three times a week    Frequency of Social Gatherings with Friends and Family: Twice a week    Attends Religious Services: Never    Database administratorActive Member of Clubs or Organizations: No    Attends BankerClub or Organization Meetings: Never    Marital Status: Widowed    Tobacco Counseling Counseling given: Not Answered   Clinical Intake:  Pre-visit preparation completed: Yes  Pain : No/denies pain     BMI - recorded: 23.18 Nutritional Status: BMI of 19-24  Normal Nutritional Risks: None Diabetes: No  How often do you need to have someone help you when you read instructions, pamphlets, or other written materials from your doctor or pharmacy?: 1 - Never  Diabetic? no  Interpreter Needed?: No  Information entered by :: Grant Cara, LPN   Activities of Daily Living    07/31/2021    2:33 PM  In your present state of health, do you have any difficulty performing the following activities:  Hearing? 0  Vision? 0  Difficulty concentrating or making decisions? 0  Walking or climbing stairs? 1  Dressing or bathing? 0  Doing errands, shopping? 1  Preparing Food and eating ? N  Using the Toilet? N  In the past six months, have you accidently leaked urine? N  Do you have problems with loss of bowel control? N  Managing your  Medications? N  Managing your Finances? N  Housekeeping or managing your Housekeeping? N    Patient Care Team: Grant Berry, Grant F, FNP as PCP - General (Family Medicine) Grant Berry, Grant Thi Hong, MD as Referring Physician (Optometry)  Indicate any recent Medical Services you may have received from other than [facility] providers in the past year (date may be approximate).     Assessment:   This is a routine wellness examination for Grant Berry.  Hearing/Vision screen Hearing Screening - Comments:: Denies hearing difficulties   Vision Screening - Comments:: No vision difficulties - up to date with routine eye exams with [facility]  Dietary issues and exercise activities discussed: Current Exercise Habits: Home exercise routine, Type of exercise: walking;stretching;strength training/weights, Time (  Minutes): 30, Frequency (Times/Week): 4, Weekly Exercise (Minutes/Week): 120, Intensity: Mild, Exercise limited by: neurologic condition(s)   Goals Addressed             This Visit's Progress    Prevent falls   On track    Continue getting better and more active/independent       Depression Screen    07/31/2021    2:19 PM 05/29/2021   10:10 AM 11/29/2020   10:11 AM 10/24/2020    9:54 AM 07/30/2020    2:26 PM 07/24/2020   10:27 AM 04/23/2020   10:30 AM  PHQ 2/9 Scores  PHQ - 2 Score 1 0 0 0 0 0 0  PHQ- 9 Score  0 0 0 0 0     Fall Risk    07/31/2021    2:18 PM 05/29/2021   10:10 AM 10/24/2020    9:54 AM 07/30/2020    2:31 PM 07/24/2020   10:27 AM  Fall Risk   Falls in the past year? 0 0 0 0 0  Number falls in past yr: 0   0   Injury with Fall? 0   0   Risk for fall due to : Impaired balance/gait;Orthopedic patient   Impaired balance/gait;Orthopedic patient;Other (Comment)   Risk for fall due to: Comment    hx of stroke   Follow up Education provided;Falls prevention discussed   Falls prevention discussed;Education provided     FALL RISK PREVENTION PERTAINING TO THE HOME:  Any stairs in or  around the home? Yes  If so, are there any without handrails? No  Home free of loose throw rugs in walkways, pet beds, electrical cords, etc? Yes  Adequate lighting in your home to reduce risk of falls? Yes   ASSISTIVE DEVICES UTILIZED TO PREVENT FALLS:  Life alert? No  Use of a cane, walker or w/c? Yes  Grab bars in the bathroom? No  Shower chair or bench in shower? Yes  Elevated toilet seat or a handicapped toilet? No   TIMED UP AND GO:  Was the test performed? No . Telephonic visit  Cognitive Function:        07/31/2021    2:37 PM 07/30/2020    2:28 PM  6CIT Screen  What Year? 0 points 0 points  What month? 0 points 0 points  What time? 0 points 0 points  Count back from 20 0 points 0 points  Months in reverse 0 points 2 points  Repeat phrase 0 points 4 points  Total Score 0 points 6 points    Immunizations Immunization History  Administered Date(s) Administered   Fluad Quad(high Dose 65+) 01/22/2020, 11/29/2020   Influenza,inj,Quad PF,6+ Mos 12/26/2012   Pneumococcal Conjugate-13 10/17/2019   Pneumococcal Polysaccharide-23 12/26/2012    TDAP status: Due, Education has been provided regarding the importance of this vaccine. Advised may receive this vaccine at local pharmacy or Health Dept. Aware to provide a copy of the vaccination record if obtained from local pharmacy or Health Dept. Verbalized acceptance and understanding.  Flu Vaccine status: Up to date  Pneumococcal vaccine status: Up to date  Covid-19 vaccine status: Declined, Education has been provided regarding the importance of this vaccine but patient still declined. Advised may receive this vaccine at local pharmacy or Health Dept.or vaccine clinic. Aware to provide a copy of the vaccination record if obtained from local pharmacy or Health Dept. Verbalized acceptance and understanding.  Qualifies for Shingles Vaccine? Yes   Zostavax completed No  Shingrix Completed?: No.    Education has been provided  regarding the importance of this vaccine. Patient has been advised to call insurance company to determine out of pocket expense if they have not yet received this vaccine. Advised may also receive vaccine at local pharmacy or Health Dept. Verbalized acceptance and understanding.  Screening Tests Health Maintenance  Topic Date Due   COVID-19 Vaccine (1) Never done   Zoster Vaccines- Shingrix (1 of 2) 08/28/2021 (Originally 08/10/1996)   TETANUS/TDAP  10/24/2021 (Originally 08/10/1965)   Hepatitis C Screening  10/24/2021 (Originally 08/10/1964)   Fecal DNA (Cologuard)  11/29/2021 (Originally 08/11/1991)   INFLUENZA VACCINE  09/23/2021   Pneumonia Vaccine 74+ Years old  Completed   HPV VACCINES  Aged Out    Health Maintenance  Health Maintenance Due  Topic Date Due   COVID-19 Vaccine (1) Never done    Colorectal cancer screening: No longer required. He declined  Lung Cancer Screening: (Low Dose CT Chest recommended if Age 65-80 years, 30 pack-year currently smoking OR have quit w/in 15years.) does not qualify.   Additional Screening:  Hepatitis C Screening: does qualify; Declines  Vision Screening: Recommended annual ophthalmology exams for early detection of glaucoma and other disorders of the eye. Is the patient up to date with their annual eye exam?  Yes  Who is the provider or what is the name of the office in which the patient attends annual eye exams? [facility] in [place] If pt is not established with a provider, would they like to be referred to a provider to establish care? No .   Dental Screening: Recommended annual dental exams for proper oral hygiene  Community Resource Referral / Chronic Care Management: CRR required this visit?  No   CCM required this visit?  No      Plan:     I have personally reviewed and noted the following in the patient's chart:   Medical and social history Use of alcohol, tobacco or illicit drugs  Current medications and supplements  including opioid prescriptions. Patient is not currently taking opioid prescriptions. Functional ability and status Nutritional status Physical activity Advanced directives List of other physicians Hospitalizations, surgeries, and ER visits in previous 12 months Vitals Screenings to include cognitive, depression, and falls Referrals and appointments  In addition, I have reviewed and discussed with patient certain preventive protocols, quality metrics, and best practice recommendations. A written personalized care plan for preventive services as well as general preventive health recommendations were provided to patient.     [place] Constable, LPN   08/29/5463   Nurse Notes: None

## 2021-07-31 NOTE — Patient Instructions (Signed)
[name] , Thank you for taking time to come for your Medicare Wellness Visit. I appreciate your ongoing commitment to your health goals. Please review the following plan we discussed and let me know if I can assist you in the future.   Screening recommendations/referrals: Colonoscopy: declined Recommended yearly ophthalmology/optometry visit for glaucoma screening and checkup Recommended yearly dental visit for hygiene and checkup  Vaccinations: Influenza vaccine: Done 11/29/2020 - Repeat annually  Pneumococcal vaccine: Done 12/26/2012  & 10/17/2019 Tdap vaccine: Due - recommended every 10 years Shingles vaccine: Due - Shingrix is 2 doses 2-6 months apart and over 90% effective     Covid-19: Declined  Advanced directives: Please bring a copy of your health care power of attorney and living will to the office to be added to your chart at your convenience.   Conditions/risks identified: Aim for 30 minutes of exercise and/or walking, 6-8 glasses of water, and 5 servings of fruits and vegetables each day.   Next appointment: Follow up in one year for your annual wellness visit.   Preventive Care 32 Years and Older, Male  Preventive care refers to lifestyle choices and visits with your health care provider that can promote health and wellness. What does preventive care include? A yearly physical exam. This is also called an annual well check. Dental exams once or twice a year. Routine eye exams. Ask your health care provider how often you should have your eyes checked. Personal lifestyle choices, including: Daily care of your teeth and gums. Regular physical activity. Eating a healthy diet. Avoiding tobacco and drug use. Limiting alcohol use. Practicing safe sex. Taking low doses of aspirin every day. Taking vitamin and mineral supplements as recommended by your health care provider. What happens during an annual well check? The services and screenings done by your health care  provider during your annual well check will depend on your age, overall health, lifestyle risk factors, and family history of disease. Counseling  Your health care provider may ask you questions about your: Alcohol use. Tobacco use. Drug use. Emotional well-being. Home and relationship well-being. Sexual activity. Eating habits. History of falls. Memory and ability to understand (cognition). Work and work Astronomer. Screening  You may have the following tests or measurements: Height, weight, and BMI. Blood pressure. Lipid and cholesterol levels. These may be checked every 5 years, or more frequently if you are over 38 years old. Skin check. Lung cancer screening. You may have this screening every year starting at age 19 if you have a 30-pack-year history of smoking and currently smoke or have quit within the past 15 years. Fecal occult blood test (FOBT) of the stool. You may have this test every year starting at age 6. Flexible sigmoidoscopy or colonoscopy. You may have a sigmoidoscopy every 5 years or a colonoscopy every 10 years starting at age 37. Prostate cancer screening. Recommendations will vary depending on your family history and other risks. Hepatitis C blood test. Hepatitis B blood test. Sexually transmitted disease (STD) testing. Diabetes screening. This is done by checking your blood sugar (glucose) after you have not eaten for a while (fasting). You may have this done every 1-3 years. Abdominal aortic aneurysm (AAA) screening. You may need this if you are a current or former smoker. Osteoporosis. You may be screened starting at age 33 if you are at high risk. Talk with your health care provider about your test results, treatment options, and if necessary, the need for more tests. Vaccines  Your health  care provider may recommend certain vaccines, such as: Influenza vaccine. This is recommended every year. Tetanus, diphtheria, and acellular pertussis (Tdap, Td)  vaccine. You may need a Td booster every 10 years. Zoster vaccine. You may need this after age 47. Pneumococcal 13-valent conjugate (PCV13) vaccine. One dose is recommended after age 34. Pneumococcal polysaccharide (PPSV23) vaccine. One dose is recommended after age 45. Talk to your health care provider about which screenings and vaccines you need and how often you need them. This information is not intended to replace advice given to you by your health care provider. Make sure you discuss any questions you have with your health care provider. Document Released: 03/08/2015 Document Revised: 10/30/2015 Document Reviewed: 12/11/2014 Elsevier Interactive Patient Education  2017 [place] Prevention in the Home Falls can cause injuries. They can happen to people of all ages. There are many things you can do to make your home safe and to help prevent falls. What can I do on the outside of my home? Regularly fix the edges of walkways and driveways and fix any cracks. Remove anything that might make you trip as you walk through a door, such as a raised step or threshold. Trim any bushes or trees on the path to your home. Use bright outdoor lighting. Clear any walking paths of anything that might make someone trip, such as rocks or tools. Regularly check to see if handrails are loose or broken. Make sure that both sides of any steps have handrails. Any raised decks and porches should have guardrails on the edges. Have any leaves, snow, or ice cleared regularly. Use sand or salt on walking paths during winter. Clean up any spills in your garage right away. This includes oil or grease spills. What can I do in the bathroom? Use night lights. Install grab bars by the toilet and in the tub and shower. Do not use towel bars as grab bars. Use non-skid mats or decals in the tub or shower. If you need to sit down in the shower, use a plastic, non-slip stool. Keep the floor dry. Clean up any  water that spills on the floor as soon as it happens. Remove soap buildup in the tub or shower regularly. Attach bath mats securely with double-sided non-slip rug tape. Do not have throw rugs and other things on the floor that can make you trip. What can I do in the bedroom? Use night lights. Make sure that you have a light by your bed that is easy to reach. Do not use any sheets or blankets that are too big for your bed. They should not hang down onto the floor. Have a firm chair that has side arms. You can use this for support while you get dressed. Do not have throw rugs and other things on the floor that can make you trip. What can I do in the kitchen? Clean up any spills right away. Avoid walking on wet floors. Keep items that you use a lot in easy-to-reach places. If you need to reach something above you, use a strong step stool that has a grab bar. Keep electrical cords out of the way. Do not use floor polish or wax that makes floors slippery. If you must use wax, use non-skid floor wax. Do not have throw rugs and other things on the floor that can make you trip. What can I do with my stairs? Do not leave any items on the stairs. Make sure that there are handrails on  both sides of the stairs and use them. Fix handrails that are broken or loose. Make sure that handrails are as long as the stairways. Check any carpeting to make sure that it is firmly attached to the stairs. Fix any carpet that is loose or worn. Avoid having throw rugs at the top or bottom of the stairs. If you do have throw rugs, attach them to the floor with carpet tape. Make sure that you have a light switch at the top of the stairs and the bottom of the stairs. If you do not have them, ask someone to add them for you. What else can I do to help prevent falls? Wear shoes that: Do not have high heels. Have rubber bottoms. Are comfortable and fit you well. Are closed at the toe. Do not wear sandals. If you use a  stepladder: Make sure that it is fully opened. Do not climb a closed stepladder. Make sure that both sides of the stepladder are locked into place. Ask someone to hold it for you, if possible. Clearly mark and make sure that you can see: Any grab bars or handrails. First and last steps. Where the edge of each step is. Use tools that help you move around (mobility aids) if they are needed. These include: Canes. Walkers. Scooters. Crutches. Turn on the lights when you go into a dark area. Replace any light bulbs as soon as they burn out. Set up your furniture so you have a clear path. Avoid moving your furniture around. If any of your floors are uneven, fix them. If there are any pets around you, be aware of where they are. Review your medicines with your doctor. Some medicines can make you feel dizzy. This can increase your chance of falling. Ask your doctor what other things that you can do to help prevent falls. This information is not intended to replace advice given to you by your health care provider. Make sure you discuss any questions you have with your health care provider. Document Released: 12/06/2008 Document Revised: 07/18/2015 Document Reviewed: 03/16/2014 Elsevier Interactive Patient Education  2017 [employer].

## 2021-10-01 ENCOUNTER — Encounter: Payer: Self-pay | Admitting: *Deleted

## 2021-12-30 ENCOUNTER — Other Ambulatory Visit: Payer: Self-pay | Admitting: Family Medicine

## 2021-12-30 DIAGNOSIS — I10 Essential (primary) hypertension: Secondary | ICD-10-CM

## 2021-12-31 ENCOUNTER — Ambulatory Visit: Payer: Medicare Other | Admitting: Family Medicine

## 2022-01-08 ENCOUNTER — Encounter: Payer: Self-pay | Admitting: Family Medicine

## 2022-01-08 ENCOUNTER — Ambulatory Visit (INDEPENDENT_AMBULATORY_CARE_PROVIDER_SITE_OTHER): Payer: Medicare Other | Admitting: Family Medicine

## 2022-01-08 VITALS — BP 110/62 | HR 80 | Temp 98.2°F | Ht 67.0 in | Wt 134.4 lb

## 2022-01-08 DIAGNOSIS — E782 Mixed hyperlipidemia: Secondary | ICD-10-CM

## 2022-01-08 DIAGNOSIS — I10 Essential (primary) hypertension: Secondary | ICD-10-CM

## 2022-01-08 DIAGNOSIS — Z789 Other specified health status: Secondary | ICD-10-CM

## 2022-01-08 DIAGNOSIS — I6930 Unspecified sequelae of cerebral infarction: Secondary | ICD-10-CM

## 2022-01-08 DIAGNOSIS — Z23 Encounter for immunization: Secondary | ICD-10-CM | POA: Diagnosis not present

## 2022-01-08 LAB — CBC WITH DIFFERENTIAL/PLATELET
Basophils Absolute: 0.1 10*3/uL (ref 0.0–0.2)
Basos: 1 %
EOS (ABSOLUTE): 0.1 10*3/uL (ref 0.0–0.4)
Eos: 2 %
Hematocrit: 44.2 % (ref 37.5–51.0)
Hemoglobin: 14.7 g/dL (ref 13.0–17.7)
Immature Grans (Abs): 0 10*3/uL (ref 0.0–0.1)
Immature Granulocytes: 0 %
Lymphocytes Absolute: 1.9 10*3/uL (ref 0.7–3.1)
Lymphs: 31 %
MCH: 29.8 pg (ref 26.6–33.0)
MCHC: 33.3 g/dL (ref 31.5–35.7)
MCV: 90 fL (ref 79–97)
Monocytes Absolute: 0.5 10*3/uL (ref 0.1–0.9)
Monocytes: 8 %
Neutrophils Absolute: 3.6 10*3/uL (ref 1.4–7.0)
Neutrophils: 58 %
Platelets: 216 10*3/uL (ref 150–450)
RBC: 4.94 x10E6/uL (ref 4.14–5.80)
RDW: 13.4 % (ref 11.6–15.4)
WBC: 6.1 10*3/uL (ref 3.4–10.8)

## 2022-01-08 LAB — CMP14+EGFR
ALT: 14 IU/L (ref 0–44)
AST: 19 IU/L (ref 0–40)
Albumin/Globulin Ratio: 1.8 (ref 1.2–2.2)
Albumin: 4.6 g/dL (ref 3.8–4.8)
Alkaline Phosphatase: 60 IU/L (ref 44–121)
BUN/Creatinine Ratio: 12 (ref 10–24)
BUN: 15 mg/dL (ref 8–27)
Bilirubin Total: 0.4 mg/dL (ref 0.0–1.2)
CO2: 25 mmol/L (ref 20–29)
Calcium: 9.6 mg/dL (ref 8.6–10.2)
Chloride: 99 mmol/L (ref 96–106)
Creatinine, Ser: 1.27 mg/dL (ref 0.76–1.27)
Globulin, Total: 2.6 g/dL (ref 1.5–4.5)
Glucose: 88 mg/dL (ref 70–99)
Potassium: 4 mmol/L (ref 3.5–5.2)
Sodium: 139 mmol/L (ref 134–144)
Total Protein: 7.2 g/dL (ref 6.0–8.5)
eGFR: 59 mL/min/{1.73_m2} — ABNORMAL LOW (ref >59)

## 2022-01-08 LAB — LIPID PANEL
Chol/HDL Ratio: 4.2 ratio (ref 0.0–5.0)
Cholesterol, Total: 223 mg/dL — ABNORMAL HIGH (ref 100–199)
HDL: 53 mg/dL (ref >39)
LDL Chol Calc (NIH): 151 mg/dL — ABNORMAL HIGH (ref 0–99)
Triglycerides: 106 mg/dL (ref 0–149)
VLDL Cholesterol Cal: 19 mg/dL (ref 5–40)

## 2022-01-08 MED ORDER — AMLODIPINE BESYLATE 5 MG PO TABS: 5.0000 mg | ORAL_TABLET | Freq: Every day | ORAL | 3 refills | Status: DC

## 2022-01-08 NOTE — Addendum Note (Signed)
Addended by: Hessie Diener on: 01/08/2022 04:58 PM   Modules accepted: Orders

## 2022-01-08 NOTE — Patient Instructions (Signed)
Managing Your Hypertension Hypertension, also called high blood pressure, is when the force of the blood pressing against the walls of the arteries is too strong. Arteries are blood vessels that carry blood from your heart throughout your body. Hypertension forces the heart to work harder to pump blood and may cause the arteries to become narrow or stiff. Understanding blood pressure readings A blood pressure reading includes a higher number over a lower number: The first, or top, number is called the systolic pressure. It is a measure of the pressure in your arteries as your heart beats. The second, or bottom number, is called the diastolic pressure. It is a measure of the pressure in your arteries as the heart relaxes. For most people, a normal blood pressure is below 120/80. Your personal target blood pressure may vary depending on your medical conditions, your age, and other factors. Blood pressure is classified into four stages. Based on your blood pressure reading, your health care provider may use the following stages to determine what type of treatment you need, if any. Systolic pressure and diastolic pressure are measured in a unit called millimeters of mercury (mmHg). Normal Systolic pressure: below 120. Diastolic pressure: below 80. Elevated Systolic pressure: 120-129. Diastolic pressure: below 80. Hypertension stage 1 Systolic pressure: 130-139. Diastolic pressure: 80-89. Hypertension stage 2 Systolic pressure: 140 or above. Diastolic pressure: 90 or above. How can this condition affect me? Managing your hypertension is very important. Over time, hypertension can damage the arteries and decrease blood flow to parts of the body, including the brain, heart, and kidneys. Having untreated or uncontrolled hypertension can lead to: A heart attack. A stroke. A weakened blood vessel (aneurysm). Heart failure. Kidney damage. Eye damage. Memory and concentration problems. Vascular  dementia. What actions can I take to manage this condition? Hypertension can be managed by making lifestyle changes and possibly by taking medicines. Your health care provider will help you make a plan to bring your blood pressure within a normal range. You may be referred for counseling on a healthy diet and physical activity. Nutrition  Eat a diet that is high in fiber and potassium, and low in salt (sodium), added sugar, and fat. An example eating plan is called the DASH diet. DASH stands for Dietary Approaches to Stop Hypertension. To eat this way: Eat plenty of fresh fruits and vegetables. Try to fill one-half of your plate at each meal with fruits and vegetables. Eat whole grains, such as whole-wheat pasta, brown rice, or whole-grain bread. Fill about one-fourth of your plate with whole grains. Eat low-fat dairy products. Avoid fatty cuts of meat, processed or cured meats, and poultry with skin. Fill about one-fourth of your plate with lean proteins such as fish, chicken without skin, beans, eggs, and tofu. Avoid pre-made and processed foods. These tend to be higher in sodium, added sugar, and fat. Reduce your daily sodium intake. Many people with hypertension should eat less than 1,500 mg of sodium a day. Lifestyle  Work with your health care provider to maintain a healthy body weight or to lose weight. Ask what an ideal weight is for you. Get at least 30 minutes of exercise that causes your heart to beat faster (aerobic exercise) most days of the week. Activities may include walking, swimming, or biking. Include exercise to strengthen your muscles (resistance exercise), such as weight lifting, as part of your weekly exercise routine. Try to do these types of exercises for 30 minutes at least 3 days a week. Do   not use any products that contain nicotine or tobacco. These products include cigarettes, chewing tobacco, and vaping devices, such as e-cigarettes. If you need help quitting, ask your  health care provider. Control any long-term (chronic) conditions you have, such as high cholesterol or diabetes. Identify your sources of stress and find ways to manage stress. This may include meditation, deep breathing, or making time for fun activities. Alcohol use Do not drink alcohol if: Your health care provider tells you not to drink. You are pregnant, may be pregnant, or are planning to become pregnant. If you drink alcohol: Limit how much you have to: 0-1 drink a day for women. 0-2 drinks a day for men. Know how much alcohol is in your drink. In the [place] one drink equals one 12 oz bottle of beer (355 mL), one 5 oz glass of wine (148 mL), or one 1 oz glass of hard liquor (44 mL). Medicines Your health care provider may prescribe medicine if lifestyle changes are not enough to get your blood pressure under control and if: Your systolic blood pressure is 130 or higher. Your diastolic blood pressure is 80 or higher. Take medicines only as told by your health care provider. Follow the directions carefully. Blood pressure medicines must be taken as told by your health care provider. The medicine does not work as well when you skip doses. Skipping doses also puts you at risk for problems. Monitoring Before you monitor your blood pressure: Do not smoke, drink caffeinated beverages, or exercise within 30 minutes before taking a measurement. Use the bathroom and empty your bladder (urinate). Sit quietly for at least 5 minutes before taking measurements. Monitor your blood pressure at home as told by your health care provider. To do this: Sit with your back straight and supported. Place your feet flat on the floor. Do not cross your legs. Support your arm on a flat surface, such as a table. Make sure your upper arm is at heart level. Each time you measure, take two or three readings one minute apart and record the results. You may also need to have your blood pressure checked regularly by  your health care provider. General information Talk with your health care provider about your diet, exercise habits, and other lifestyle factors that may be contributing to hypertension. Review all the medicines you take with your health care provider because there may be side effects or interactions. Keep all follow-up visits. Your health care provider can help you create and adjust your plan for managing your high blood pressure. Where to find more information National Heart, Lung, and Blood Institute: www.nhlbi.nih.gov American Heart Association: www.heart.org Contact a health care provider if: You think you are having a reaction to medicines you have taken. You have repeated (recurrent) headaches. You feel dizzy. You have swelling in your ankles. You have trouble with your vision. Get help right away if: You develop a severe headache or confusion. You have unusual weakness or numbness, or you feel faint. You have severe pain in your chest or abdomen. You vomit repeatedly. You have trouble breathing. These symptoms may be an emergency. Get help right away. Call 911. Do not wait to see if the symptoms will go away. Do not drive yourself to the hospital. Summary Hypertension is when the force of blood pumping through your arteries is too strong. If this condition is not controlled, it may put you at risk for serious complications. Your personal target blood pressure may vary depending on your medical conditions,   your age, and other factors. For most people, a normal blood pressure is less than 120/80. Hypertension is managed by lifestyle changes, medicines, or both. Lifestyle changes to help manage hypertension include losing weight, eating a healthy, low-sodium diet, exercising more, stopping smoking, and limiting alcohol. This information is not intended to replace advice given to you by your health care provider. Make sure you discuss any questions you have with your health care  provider. Document Revised: 10/24/2020 Document Reviewed: 10/24/2020 Elsevier Patient Education  2023 Elsevier Inc.  

## 2022-01-08 NOTE — Progress Notes (Signed)
Established Patient Office Visit  Subjective   Patient ID: Grant Berry, male    DOB: Feb 26, 1945  Age: 76 y.o. MRN: [id number]  Chief Complaint  Patient presents with   Medical Management of Chronic Issues   Hypertension    HPI HTN Complaint with meds - Yes Current Medications - amlodipine 5 mg Checking BP at home ranging 110s/60s Pertinent ROS:  Headache - No Fatigue - No Visual Disturbances - No Chest pain - No Dyspnea - No Palpitations - No LE edema - No  2. HLD Stopped taking lovastatin last week due to fatigue. Reports that he feels better since stopping this.   3. History of CVA History of stroke 2 years ago. He has residual effects from this. He has residual weakness of his left side, dizziness, and mobility difficulty. He was evaluated by neurology outpatient. They discontinued his plavix. Instructed to continue aspirin 81 mg daily. Recommended LDL goal is <70.  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    Objective:     BP 110/62 Comment: at home reading per pt  Pulse 80   Temp 98.2 F (36.8 C) (Temporal)   Ht _0  (1.702 m)   Wt 134 lb 6 oz (61 kg)   SpO2 98%   BMI 21.05 kg/m  BP Readings from Last 3 Encounters:  01/08/22 110/62  06/26/21 (!) 148/78  05/29/21 (!) 162/92     Physical Exam Vitals and nursing note reviewed.  Constitutional:      General: He is not in acute distress.    Appearance: He is not ill-appearing, toxic-appearing or diaphoretic.  HENT:     Right Ear: Tympanic membrane, ear canal and external ear normal.     Left Ear: Tympanic membrane, ear canal and external ear normal.     Nose: Nose normal.     Mouth/Throat:     Mouth: Mucous membranes are moist.     Pharynx: Oropharynx is clear.  Eyes:     Extraocular Movements: Extraocular movements intact.     Conjunctiva/sclera: Conjunctivae normal.     Pupils: Pupils are  equal, round, and reactive to light.  Neck:     Thyroid: No thyroid mass, thyromegaly or thyroid tenderness.  Cardiovascular:     Rate and Rhythm: Normal rate and regular rhythm.     Heart sounds: Normal heart sounds. No murmur heard. Pulmonary:     Effort: Pulmonary effort is normal. No respiratory distress.     Breath sounds: Normal breath sounds.  Abdominal:     General: Bowel sounds are normal. There is no distension.     Palpations: Abdomen is soft.     Tenderness: There is no abdominal tenderness.  Musculoskeletal:     Cervical back: Neck supple. No rigidity.     Right lower leg: No edema.     Left lower leg: No edema.  Skin:    General: Skin is warm and dry.  Neurological:     Mental Status: He is alert and oriented to person, place, and time.     Motor: Weakness (residual weakness to left upper of lower extremity) present.     Coordination: Coordination normal.     Gait: Gait abnormal (arrives with walker).  Psychiatric:        Mood and Affect: Mood normal.        Behavior: Behavior normal.  No results found for any visits on 01/08/22.    The ASCVD Risk score (Arnett DK, et al., 2019) failed to calculate for the following reasons:   The patient has a prior MI or stroke diagnosis    Assessment & Plan:   Diamonte was seen today for medical management of chronic issues and hypertension.  Diagnoses and all orders for this visit:  Primary hypertension Well controlled on current regimen. Continue amlodipine. Labs pending.  -     amLODipine (NORVASC) 5 MG tablet; Take 1 tablet (5 mg total) by mouth daily. -     CBC with Differential/Platelet -     CMP14+EGFR -     Lipid panel  Mixed hyperlipidemia Statin intolerance Stopped lovastatin last week. This is the 3rd statin he has tried and failed. Fasting lipid panel pending. Will discuss zetia if needed pending labs.  -     CBC with Differential/Platelet -     CMP14+EGFR -     Lipid panel  History of  cerebrovascular accident (CVA) with residual deficit Continue aspirin.   Flu and tetanus vaccine today.    Return in about 6 months (around 07/09/2022) for CPE.   The patient indicates understanding of these issues and agrees with the plan.  Gwenlyn Perking, FNP

## 2022-01-09 ENCOUNTER — Other Ambulatory Visit: Payer: Self-pay | Admitting: Family Medicine

## 2022-01-09 DIAGNOSIS — Z789 Other specified health status: Secondary | ICD-10-CM

## 2022-01-09 DIAGNOSIS — E782 Mixed hyperlipidemia: Secondary | ICD-10-CM

## 2022-01-09 MED ORDER — EZETIMIBE 10 MG PO TABS: 10.0000 mg | ORAL_TABLET | Freq: Every day | ORAL | 3 refills | Status: DC

## 2022-05-24 IMAGING — US US CAROTID DUPLEX BILAT
1 series · 13 of 24 positions shown · non-contrast
Comparison: None.

CLINICAL DATA: 74-year-old male with vertigo and left-sided
weakness for the past 2 days

EXAM:
BILATERAL CAROTID DUPLEX ULTRASOUND
TECHNIQUE: Gray scale imaging, color Doppler and duplex ultrasound were
performed of bilateral carotid and vertebral arteries in the neck.

[Series 1: us carotid bilateral · 13 of 68 slices shown]
[im 1/68]
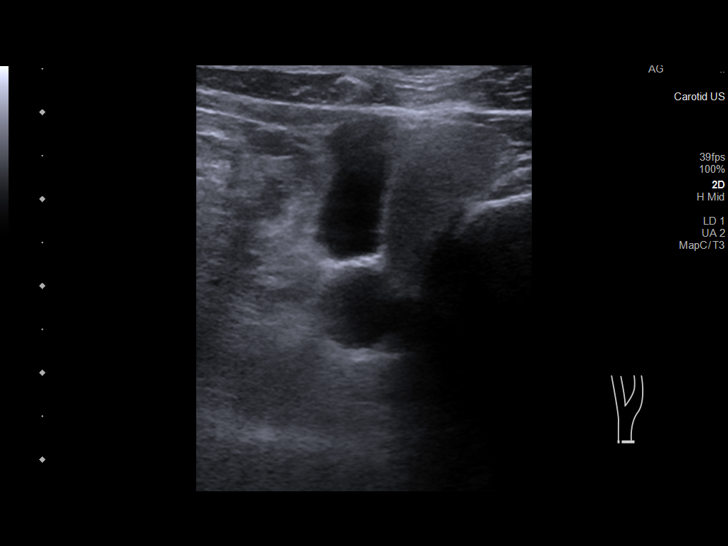
[im 6/68]
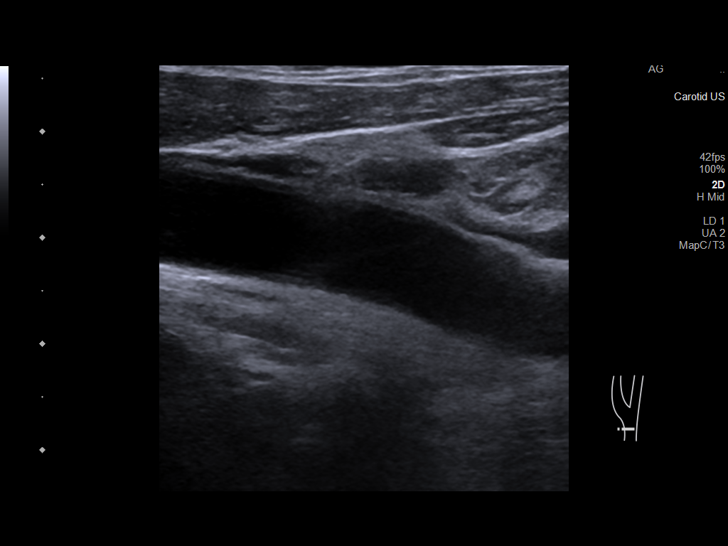
[im 12/68]
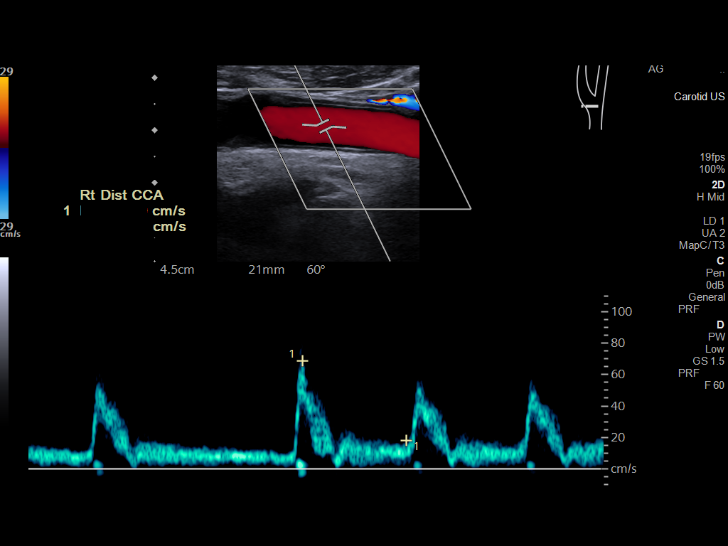
[im 18/68]
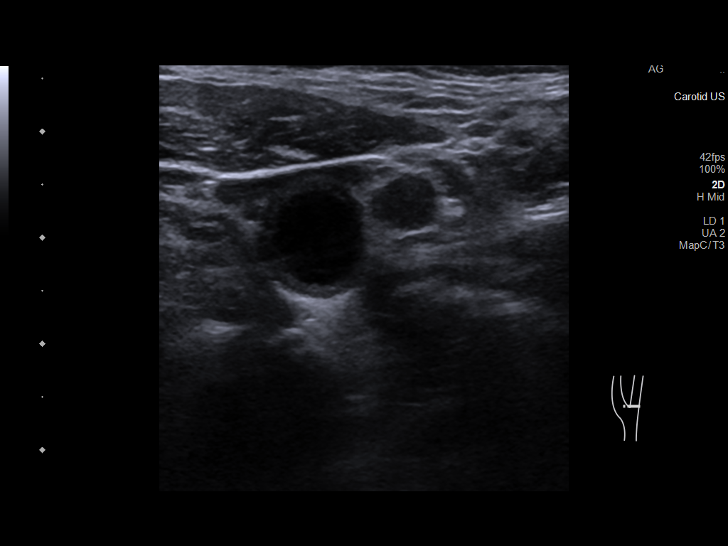
[im 24/68]
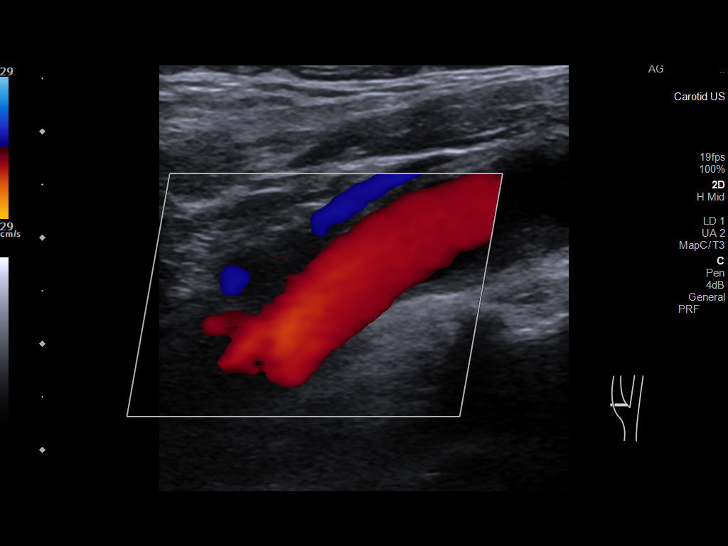
[im 30/68]
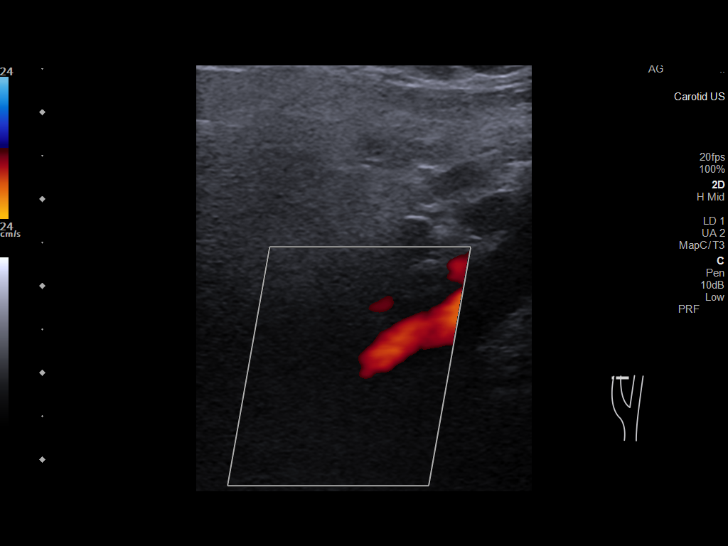
[im 35/68]
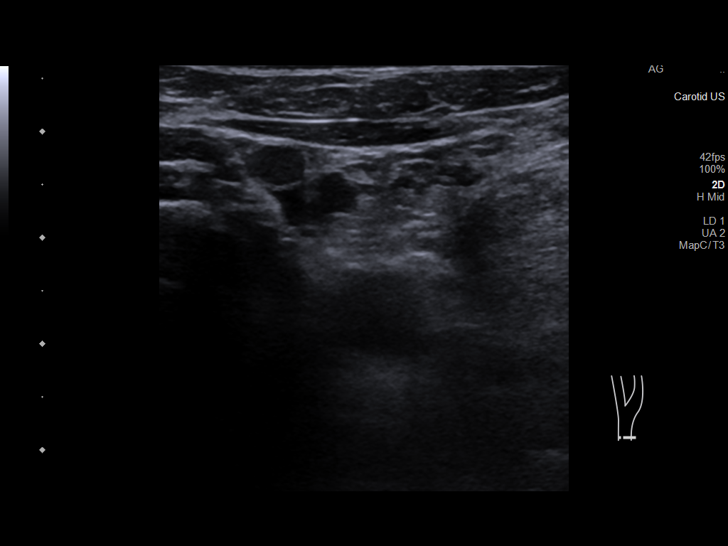
[im 38/68]
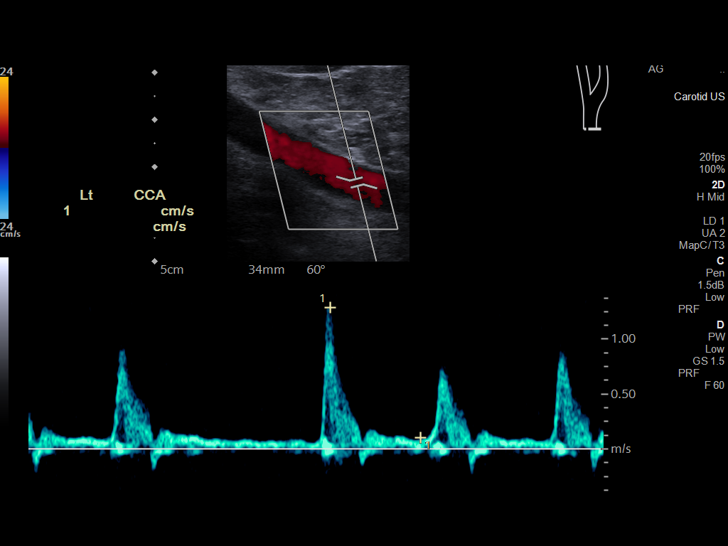
[im 44/68]
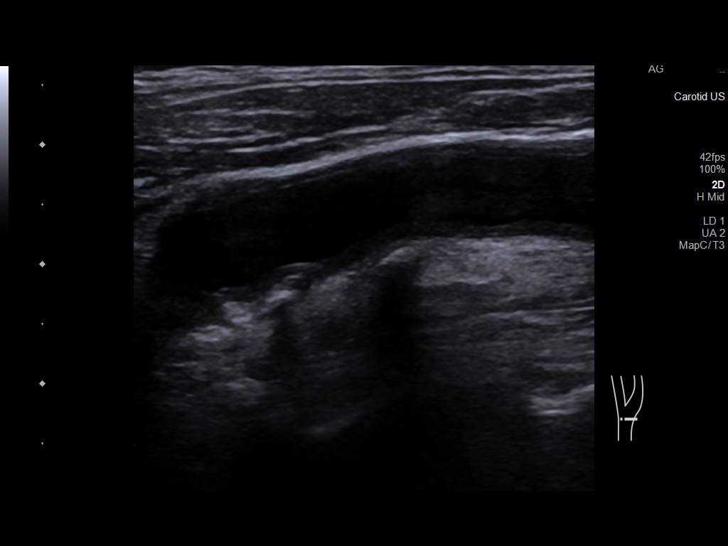
[im 50/68]
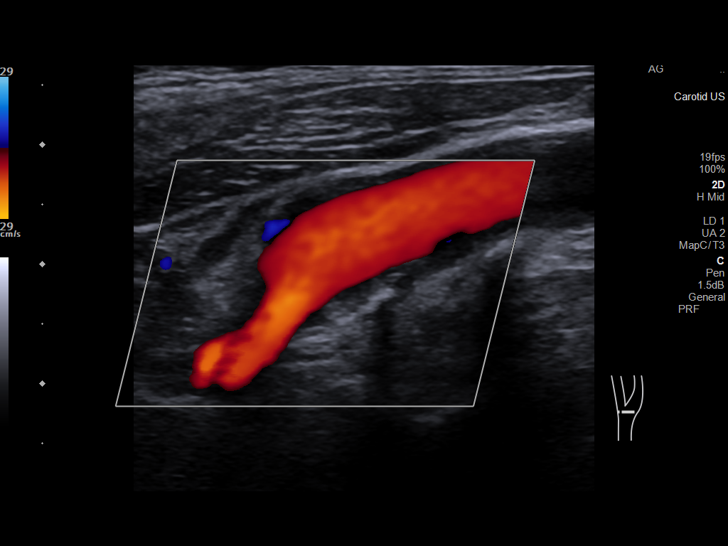
[im 56/68]
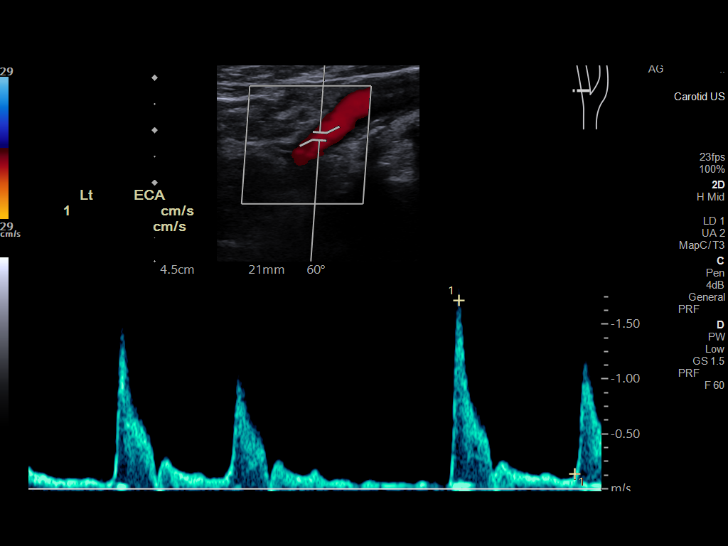
[im 62/68]
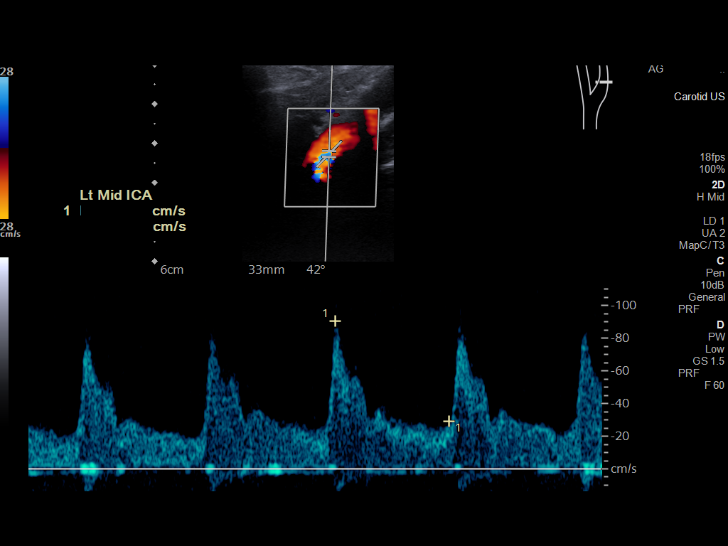
[im 68/68]
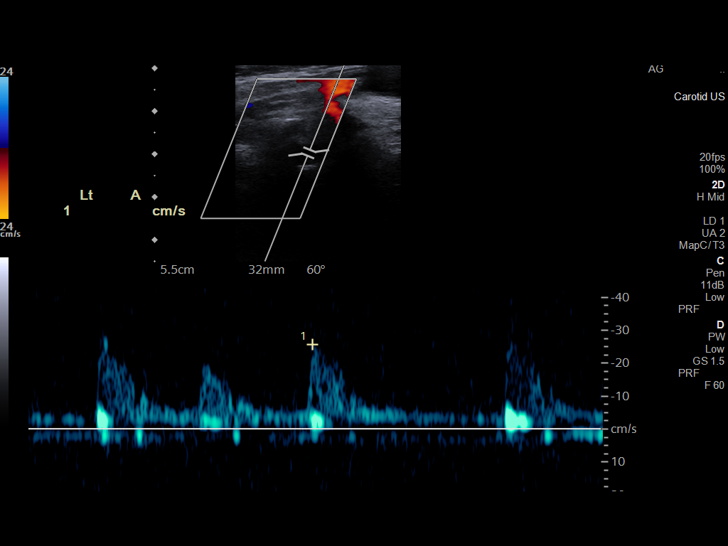

[13 of 24 positions shown; findings below may reference images not displayed]

FINDINGS: Criteria: Quantification of carotid stenosis is based on velocity
parameters that correlate the residual internal carotid diameter
with NASCET-based stenosis levels, using the diameter of the distal
internal carotid lumen as the denominator for stenosis measurement.

The following velocity measurements were obtained:

RIGHT
ICA: 63/19 cm/sec
CCA: 79/17 cm/sec

SYSTOLIC ICA/CCA RATIO:

ECA:  132 cm/sec

LEFT

ICA: 135/35 cm/sec

CCA: 109/14 cm/sec

SYSTOLIC ICA/CCA RATIO:

ECA:  172 cm/sec

RIGHT CAROTID ARTERY: Heterogeneous atherosclerotic plaque in the
proximal internal carotid artery. By peak systolic velocity
criteria, the estimated stenosis remains less than 50%.

RIGHT VERTEBRAL ARTERY:  Patent with normal antegrade flow.

LEFT CAROTID ARTERY: Moderate heterogeneous atherosclerotic plaque
in the proximal internal carotid artery. By peak systolic velocity
criteria, the estimated stenosis falls in the 50-69% diameter range.

LEFT VERTEBRAL ARTERY:  Patent with normal antegrade flow.
IMPRESSION: 1. Mild (1-49%) stenosis proximal right internal carotid artery
secondary to mild heterogeneous atherosclerotic plaque.
2. Moderate (50-69%) stenosis proximal left internal carotid artery
secondary to heterogenous atherosclerotic plaque.
3. The vertebral arteries are patent with normal antegrade flow.

## 2022-07-10 ENCOUNTER — Encounter: Payer: Self-pay | Admitting: Family Medicine

## 2022-07-10 ENCOUNTER — Ambulatory Visit (INDEPENDENT_AMBULATORY_CARE_PROVIDER_SITE_OTHER): Payer: Medicare Other | Admitting: Family Medicine

## 2022-07-10 VITALS — BP 152/87 | HR 87 | Temp 97.9°F | Ht 67.0 in | Wt 138.6 lb

## 2022-07-10 DIAGNOSIS — I6930 Unspecified sequelae of cerebral infarction: Secondary | ICD-10-CM

## 2022-07-10 DIAGNOSIS — E782 Mixed hyperlipidemia: Secondary | ICD-10-CM | POA: Diagnosis not present

## 2022-07-10 DIAGNOSIS — Z789 Other specified health status: Secondary | ICD-10-CM

## 2022-07-10 DIAGNOSIS — Z0001 Encounter for general adult medical examination with abnormal findings: Secondary | ICD-10-CM | POA: Diagnosis not present

## 2022-07-10 DIAGNOSIS — I10 Essential (primary) hypertension: Secondary | ICD-10-CM | POA: Diagnosis not present

## 2022-07-10 MED ORDER — AMLODIPINE BESYLATE 10 MG PO TABS: 10.0000 mg | ORAL_TABLET | Freq: Every day | ORAL | 3 refills | Status: DC

## 2022-07-10 NOTE — Patient Instructions (Signed)
Health Maintenance, Male Adopting a healthy lifestyle and getting preventive care are important in promoting health and wellness. Ask your health care provider about: The right schedule for you to have regular tests and exams. Things you can do on your own to prevent diseases and keep yourself healthy. What should I know about diet, weight, and exercise? Eat a healthy diet  Eat a diet that includes plenty of vegetables, fruits, low-fat dairy products, and lean protein. Do not eat a lot of foods that are high in solid fats, added sugars, or sodium. Maintain a healthy weight Body mass index (BMI) is a measurement that can be used to identify possible weight problems. It estimates body fat based on height and weight. Your health care provider can help determine your BMI and help you achieve or maintain a healthy weight. Get regular exercise Get regular exercise. This is one of the most important things you can do for your health. Most adults should: Exercise for at least 150 minutes each week. The exercise should increase your heart rate and make you sweat (moderate-intensity exercise). Do strengthening exercises at least twice a week. This is in addition to the moderate-intensity exercise. Spend less time sitting. Even light physical activity can be beneficial. Watch cholesterol and blood lipids Have your blood tested for lipids and cholesterol at 76 years of age, then have this test every 5 years. You may need to have your cholesterol levels checked more often if: Your lipid or cholesterol levels are high. You are older than 76 years of age. You are at high risk for heart disease. What should I know about cancer screening? Many types of cancers can be detected early and may often be prevented. Depending on your health history and family history, you may need to have cancer screening at various ages. This may include screening for: Colorectal cancer. Prostate cancer. Skin cancer. Lung  cancer. What should I know about heart disease, diabetes, and high blood pressure? Blood pressure and heart disease High blood pressure causes heart disease and increases the risk of stroke. This is more likely to develop in people who have high blood pressure readings or are overweight. Talk with your health care provider about your target blood pressure readings. Have your blood pressure checked: Every 3-5 years if you are 18-39 years of age. Every year if you are 40 years old or older. If you are between the ages of 65 and 75 and are a current or former smoker, ask your health care provider if you should have a one-time screening for abdominal aortic aneurysm (AAA). Diabetes Have regular diabetes screenings. This checks your fasting blood sugar level. Have the screening done: Once every three years after age 45 if you are at a normal weight and have a low risk for diabetes. More often and at a younger age if you are overweight or have a high risk for diabetes. What should I know about preventing infection? Hepatitis B If you have a higher risk for hepatitis B, you should be screened for this virus. Talk with your health care provider to find out if you are at risk for hepatitis B infection. Hepatitis C Blood testing is recommended for: Everyone born from 1945 through 1965. Anyone with known risk factors for hepatitis C. Sexually transmitted infections (STIs) You should be screened each year for STIs, including gonorrhea and chlamydia, if: You are sexually active and are younger than 76 years of age. You are older than 76 years of age and your   health care provider tells you that you are at risk for this type of infection. Your sexual activity has changed since you were last screened, and you are at increased risk for chlamydia or gonorrhea. Ask your health care provider if you are at risk. Ask your health care provider about whether you are at high risk for HIV. Your health care provider  may recommend a prescription medicine to help prevent HIV infection. If you choose to take medicine to prevent HIV, you should first get tested for HIV. You should then be tested every 3 months for as long as you are taking the medicine. Follow these instructions at home: Alcohol use Do not drink alcohol if your health care provider tells you not to drink. If you drink alcohol: Limit how much you have to 0-2 drinks a day. Know how much alcohol is in your drink. In the [place] one drink equals one 12 oz bottle of beer (355 mL), one 5 oz glass of wine (148 mL), or one 1 oz glass of hard liquor (44 mL). Lifestyle Do not use any products that contain nicotine or tobacco. These products include cigarettes, chewing tobacco, and vaping devices, such as e-cigarettes. If you need help quitting, ask your health care provider. Do not use street drugs. Do not share needles. Ask your health care provider for help if you need support or information about quitting drugs. General instructions Schedule regular health, dental, and eye exams. Stay current with your vaccines. Tell your health care provider if: You often feel depressed. You have ever been abused or do not feel safe at home. Summary Adopting a healthy lifestyle and getting preventive care are important in promoting health and wellness. Follow your health care provider's instructions about healthy diet, exercising, and getting tested or screened for diseases. Follow your health care provider's instructions on monitoring your cholesterol and blood pressure. This information is not intended to replace advice given to you by your health care provider. Make sure you discuss any questions you have with your health care provider. Document Revised: 07/01/2020 Document Reviewed: 07/01/2020 Elsevier Patient Education  2023 [employer]  

## 2022-07-10 NOTE — Progress Notes (Signed)
Complete physical exam  Patient: Grant Berry   DOB: Aug 07, 1945   77 y.o. Male  MRN: [id number]  Subjective:    Chief Complaint  Patient presents with   Annual Exam    Grant Berry is a 77 y.o. male who presents today for a complete physical exam. He reports consuming a general diet. The patient does not participate in regular exercise at present. He generally feels fairly well. He reports sleeping fairly well. He does have additional problems to discuss today: elevated BP.  HTN Complaint with meds - Yes Current Medications - amlodipine Checking BP at home ranging 150-170s/80s Pertinent ROS:  Headache - No Fatigue - No Chest pain - No Dyspnea - No Palpitations - No LE edema - No    Most recent fall risk assessment:    07/10/2022    9:57 AM  Fall Risk   Falls in the past year? 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   B Complex-C (B-COMPLEX WITH VITAMIN C) tablet Take 1 tablet by mouth daily.   ezetimibe (ZETIA) 10 MG tablet Take 1 tablet (10 mg total) by mouth daily.   KRILL OIL PO Take by mouth daily.   No facility-administered medications prior to visit.    Review of Systems  Constitutional:  Negative for chills, fever and weight loss.  Eyes:  Negative for blurred vision, double vision, photophobia, pain, discharge and redness.  Respiratory:  Negative for shortness of breath and wheezing.   Cardiovascular:  Negative for chest pain, palpitations,  orthopnea, claudication and leg swelling.  Gastrointestinal:  Negative for abdominal pain, blood in stool, nausea and vomiting.  Genitourinary:  Negative for dysuria.  Musculoskeletal:  Negative for falls.  Neurological:  Positive for focal weakness (left sided since stroke). Negative for dizziness, tingling, tremors and headaches.  Psychiatric/Behavioral:  Negative for depression, substance abuse and suicidal ideas. The patient is not nervous/anxious and does not have insomnia.          Objective:     BP (!) 152/87   Pulse 87   Temp 97.9 F (36.6 C) (Temporal)   Ht 5\' 7"  (1.702 m)   Wt 138 lb 9.6 oz (62.9 kg)   SpO2 94%   BMI 21.71 kg/m    Physical Exam Vitals and nursing note reviewed.  Constitutional:      General: He is not in acute distress.    Appearance: He is not ill-appearing, toxic-appearing or diaphoretic.  HENT:     Right Ear: Tympanic membrane, ear canal and external ear normal.     Left Ear: Tympanic membrane, ear canal and external ear normal.     Nose: Nose normal.     Mouth/Throat:     Mouth: Mucous membranes are moist.     Pharynx: Oropharynx is clear.  Eyes:     Extraocular Movements: Extraocular  movements intact.     Conjunctiva/sclera: Conjunctivae normal.     Pupils: Pupils are equal, round, and reactive to light.  Neck:     Thyroid: No thyroid mass, thyromegaly or thyroid tenderness.  Cardiovascular:     Rate and Rhythm: Normal rate and regular rhythm.     Heart sounds: Normal heart sounds. No murmur heard. Pulmonary:     Effort: Pulmonary effort is normal. No respiratory distress.     Breath sounds: Normal breath sounds.  Abdominal:     General: Bowel sounds are normal. There is no distension.     Palpations: Abdomen is soft.     Tenderness: There is no abdominal tenderness.  Musculoskeletal:        General: No swelling.     Cervical back: Neck supple. No rigidity.     Right lower leg: No edema.     Left lower leg: No edema.  Skin:     General: Skin is warm and dry.  Neurological:     Mental Status: He is alert and oriented to person, place, and time. Mental status is at baseline.     Motor: Weakness (residual weakness to left upper of lower extremity) present.     Coordination: Coordination abnormal.     Gait: Gait abnormal (arrives with walker).  Psychiatric:        Mood and Affect: Mood normal.        Behavior: Behavior normal.      No results found for any visits on 07/10/22.     Assessment & Plan:    Routine Health Maintenance and Physical Exam  Brennon was seen today for annual exam.  Diagnoses and all orders for this visit:  Encounter for general adult medical examination with abnormal findings  Primary hypertension Uncontrolled. Asymptomatic. Increase amlodipine to 10 mg. Monitor BP at home. Follow up in 2 weeks for recheck. Labs pending.  -     amLODipine (NORVASC) 10 MG tablet; Take 1 tablet (10 mg total) by mouth daily. -     CBC with Differential/Platelet -     CMP14+EGFR -     TSH  Mixed hyperlipidemia Statin intolerance Started on zetia at last visit. Lipid panel pending.  -     Lipid panel  History of cerebrovascular accident (CVA) with residual deficit Residual deficits- speech, left sided weakness, impaired mobility and coordination. On aspirin. Statin intolerant.    Immunization History  Administered Date(s) Administered   Fluad Quad(high Dose 65+) 01/22/2020, 11/29/2020, 01/08/2022   Influenza,inj,Quad PF,6+ Mos 12/26/2012   Pneumococcal Conjugate-13 10/17/2019   Pneumococcal Polysaccharide-23 12/26/2012   Tdap 01/08/2022    Health Maintenance  Topic Date Due   Medicare Annual Wellness (AWV)  08/01/2022   Zoster Vaccines- Shingrix (1 of 2) 10/10/2022 (Originally 08/10/1996)   Fecal DNA (Cologuard)  01/09/2023 (Originally 08/11/1991)   COVID-19 Vaccine (1) 01/25/2023 (Originally 02/10/1946)   Hepatitis C Screening  07/10/2023 (Originally 08/10/1964)   INFLUENZA VACCINE   09/24/2022   DTaP/Tdap/Td (2 - Td or Tdap) 01/09/2032   Pneumonia Vaccine 52+ Years old  Completed   HPV VACCINES  Aged Out    Discussed health benefits of physical activity, and encouraged him to engage in regular exercise appropriate for his age and condition.  Problem List Items Addressed This Visit       Cardiovascular and Mediastinum   Primary hypertension   Relevant Medications   amLODipine (NORVASC) 10 MG tablet   Other Relevant Orders   CBC with Differential/Platelet  CMP14+EGFR   TSH     Other   Mixed hyperlipidemia   Relevant Medications   amLODipine (NORVASC) 10 MG tablet   Other Relevant Orders   Lipid panel   History of cerebrovascular accident (CVA) with residual deficit   Statin intolerance   Other Visit Diagnoses     Encounter for general adult medical examination with abnormal findings    -  Primary      Return in about 2 weeks (around 07/24/2022) for BP check.   The patient indicates understanding of these issues and agrees with the plan.  Gabriel Earing, FNP

## 2022-07-11 LAB — CMP14+EGFR
ALT: 12 IU/L (ref 0–44)
AST: 20 IU/L (ref 0–40)
Albumin/Globulin Ratio: 1.7 (ref 1.2–2.2)
Albumin: 4.3 g/dL (ref 3.8–4.8)
Alkaline Phosphatase: 57 IU/L (ref 44–121)
BUN/Creatinine Ratio: 12 (ref 10–24)
BUN: 14 mg/dL (ref 8–27)
Bilirubin Total: 0.4 mg/dL (ref 0.0–1.2)
CO2: 27 mmol/L (ref 20–29)
Calcium: 9.6 mg/dL (ref 8.6–10.2)
Chloride: 100 mmol/L (ref 96–106)
Creatinine, Ser: 1.13 mg/dL (ref 0.76–1.27)
Globulin, Total: 2.5 g/dL (ref 1.5–4.5)
Glucose: 90 mg/dL (ref 70–99)
Potassium: 3.8 mmol/L (ref 3.5–5.2)
Sodium: 140 mmol/L (ref 134–144)
Total Protein: 6.8 g/dL (ref 6.0–8.5)
eGFR: 68 mL/min/{1.73_m2} (ref >59)

## 2022-07-11 LAB — CBC WITH DIFFERENTIAL/PLATELET
Basophils Absolute: 0.1 10*3/uL (ref 0.0–0.2)
Basos: 1 %
EOS (ABSOLUTE): 0.3 10*3/uL (ref 0.0–0.4)
Eos: 4 %
Hematocrit: 42.3 % (ref 37.5–51.0)
Hemoglobin: 13.9 g/dL (ref 13.0–17.7)
Immature Grans (Abs): 0 10*3/uL (ref 0.0–0.1)
Immature Granulocytes: 0 %
Lymphocytes Absolute: 2 10*3/uL (ref 0.7–3.1)
Lymphs: 33 %
MCH: 29.6 pg (ref 26.6–33.0)
MCHC: 32.9 g/dL (ref 31.5–35.7)
MCV: 90 fL (ref 79–97)
Monocytes Absolute: 0.5 10*3/uL (ref 0.1–0.9)
Monocytes: 9 %
Neutrophils Absolute: 3.2 10*3/uL (ref 1.4–7.0)
Neutrophils: 53 %
Platelets: 220 10*3/uL (ref 150–450)
RBC: 4.69 x10E6/uL (ref 4.14–5.80)
RDW: 14.1 % (ref 11.6–15.4)
WBC: 6 10*3/uL (ref 3.4–10.8)

## 2022-07-11 LAB — LIPID PANEL
Chol/HDL Ratio: 4.2 ratio (ref 0.0–5.0)
Cholesterol, Total: 202 mg/dL — ABNORMAL HIGH (ref 100–199)
HDL: 48 mg/dL (ref >39)
LDL Chol Calc (NIH): 134 mg/dL — ABNORMAL HIGH (ref 0–99)
Triglycerides: 109 mg/dL (ref 0–149)
VLDL Cholesterol Cal: 20 mg/dL (ref 5–40)

## 2022-07-11 LAB — TSH: TSH: 4.48 u[IU]/mL (ref 0.450–4.500)

## 2022-07-24 ENCOUNTER — Ambulatory Visit (INDEPENDENT_AMBULATORY_CARE_PROVIDER_SITE_OTHER): Payer: Medicare Other | Admitting: Family Medicine

## 2022-07-24 ENCOUNTER — Encounter: Payer: Self-pay | Admitting: Family Medicine

## 2022-07-24 VITALS — BP 135/74 | HR 79 | Temp 97.9°F | Ht 67.0 in | Wt 137.4 lb

## 2022-07-24 DIAGNOSIS — I10 Essential (primary) hypertension: Secondary | ICD-10-CM | POA: Diagnosis not present

## 2022-07-24 NOTE — Progress Notes (Signed)
Established Patient Office Visit  Subjective   Patient ID: Grant Berry, male    DOB: [birth year]  Age: 77 y.o. MRN: [id number]  Chief Complaint  Patient presents with   Medical Management of Chronic Issues    2 week    HPI  HTN Complaint with meds - Yes Current Medications - amlodipine 10 mg Checking BP at home ranging 120s/70s Pertinent ROS:  Headache - No Fatigue - No Visual Disturbances - No Chest pain - No Dyspnea - No Palpitations - No LE edema - No   Past Medical History:  Diagnosis Date   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   Hyperlipidemia    Hypertension       ROS As per HPI.    Objective:     BP (!) 143/65   Pulse 79   Temp 97.9 F (36.6 C) (Temporal)   Ht 5\' 7"  (1.702 m)   Wt 137 lb 6.4 oz (62.3 kg)   SpO2 96%   BMI 21.52 kg/m  BP Readings from Last 3 Encounters:  07/24/22 (!) 143/65  07/10/22 (!) 152/87  01/08/22 110/62      Physical Exam Vitals and nursing note reviewed.  Constitutional:      General: He is not in acute distress.    Appearance: He is not ill-appearing, toxic-appearing or diaphoretic.  HENT:     Right Ear: Tympanic membrane, ear canal and external ear normal.     Left Ear: Tympanic membrane, ear canal and external ear normal.     Nose: Nose normal.     Mouth/Throat:     Mouth: Mucous membranes are moist.     Pharynx: Oropharynx is clear.  Eyes:     Extraocular Movements: Extraocular movements intact.     Conjunctiva/sclera: Conjunctivae normal.     Pupils: Pupils are equal, round, and reactive to light.  Neck:     Thyroid: No thyroid mass, thyromegaly or thyroid tenderness.  Cardiovascular:     Rate and Rhythm: Normal rate and regular rhythm.     Heart sounds: Normal heart sounds. No murmur heard. Pulmonary:     Effort: Pulmonary effort is normal. No respiratory distress.     Breath sounds: Normal breath sounds.  Abdominal:     General: Bowel sounds are normal. There is no distension.      Palpations: Abdomen is soft.     Tenderness: There is no abdominal tenderness.  Musculoskeletal:     Cervical back: Neck supple. No rigidity.     Right lower leg: No edema.     Left lower leg: No edema.  Skin:    General: Skin is warm and dry.  Neurological:     Mental Status: He is alert and oriented to person, place, and time.     Motor: Weakness (residual weakness to left upper of lower extremity) present.     Coordination: Coordination normal.     Gait: Gait abnormal (arrives with walker).  Psychiatric:        Mood and Affect: Mood normal.        Behavior: Behavior normal.      No results found for any visits on 07/24/22.    The ASCVD Risk score (Arnett DK, et al., 2019) failed to calculate for the following reasons:   The patient has a prior MI or stroke diagnosis    Assessment & Plan:   Crowley was seen today for medical management of chronic issues.  Diagnoses and all orders for this visit:  Primary hypertension Well controlled on current regimen.    Return in about 3 months (around 10/24/2022) for chronic follow up.  The patient indicates understanding of these issues and agrees with the plan.    Gabriel Earing, FNP

## 2022-08-03 ENCOUNTER — Ambulatory Visit (INDEPENDENT_AMBULATORY_CARE_PROVIDER_SITE_OTHER): Payer: Medicare Other

## 2022-08-03 VITALS — Ht 67.0 in | Wt 137.0 lb

## 2022-08-03 DIAGNOSIS — Z0000 Encounter for general adult medical examination without abnormal findings: Secondary | ICD-10-CM

## 2022-08-03 NOTE — Patient Instructions (Signed)
Grant Berry , Thank you for taking time to come for your Medicare Wellness Visit. I appreciate your ongoing commitment to your health goals. Please review the following plan we discussed and let me know if I can assist you in the future.   These are the goals we discussed:  Goals      DIET - INCREASE WATER INTAKE     Prevent falls     Continue getting better and more active/independent        This is a list of the screening recommended for you and due dates:  Health Maintenance  Topic Date Due   Zoster (Shingles) Vaccine (1 of 2) 10/10/2022*   Cologuard (Stool DNA test)  01/09/2023*   COVID-19 Vaccine (1) 01/25/2023*   Hepatitis C Screening  07/10/2023*   Flu Shot  09/24/2022   Medicare Annual Wellness Visit  08/03/2023   DTaP/Tdap/Td vaccine (2 - Td or Tdap) 01/09/2032   Pneumonia Vaccine  Completed   HPV Vaccine  Aged Out  *Topic was postponed. The date shown is not the original due date.    Advanced directives: Advance directive discussed with you today. I have provided a copy for you to complete at home and have notarized. Once this is complete please bring a copy in to our office so we can scan it into your chart.   Conditions/risks identified: Aim for 30 minutes of exercise or brisk walking, 6-8 glasses of water, and 5 servings of fruits and vegetables each day.   Next appointment: Follow up in one year for your annual wellness visit.   Preventive Care 57 Years and Older, Male  Preventive care refers to lifestyle choices and visits with your health care provider that can promote health and wellness. What does preventive care include? A yearly physical exam. This is also called an annual well check. Dental exams once or twice a year. Routine eye exams. Ask your health care provider how often you should have your eyes checked. Personal lifestyle choices, including: Daily care of your teeth and gums. Regular physical activity. Eating a healthy diet. Avoiding tobacco  and drug use. Limiting alcohol use. Practicing safe sex. Taking low doses of aspirin every day. Taking vitamin and mineral supplements as recommended by your health care provider. What happens during an annual well check? The services and screenings done by your health care provider during your annual well check will depend on your age, overall health, lifestyle risk factors, and family history of disease. Counseling  Your health care provider may ask you questions about your: Alcohol use. Tobacco use. Drug use. Emotional well-being. Home and relationship well-being. Sexual activity. Eating habits. History of falls. Memory and ability to understand (cognition). Work and work Astronomer. Screening  You may have the following tests or measurements: Height, weight, and BMI. Blood pressure. Lipid and cholesterol levels. These may be checked every 5 years, or more frequently if you are over 5 years old. Skin check. Lung cancer screening. You may have this screening every year starting at age 54 if you have a 30-pack-year history of smoking and currently smoke or have quit within the past 15 years. Fecal occult blood test (FOBT) of the stool. You may have this test every year starting at age 42. Flexible sigmoidoscopy or colonoscopy. You may have a sigmoidoscopy every 5 years or a colonoscopy every 10 years starting at age 45. Prostate cancer screening. Recommendations will vary depending on your family history and other risks. Hepatitis C blood test. Hepatitis B  blood test. Sexually transmitted disease (STD) testing. Diabetes screening. This is done by checking your blood sugar (glucose) after you have not eaten for a while (fasting). You may have this done every 1-3 years. Abdominal aortic aneurysm (AAA) screening. You may need this if you are a current or former smoker. Osteoporosis. You may be screened starting at age 43 if you are at high risk. Talk with your health care provider  about your test results, treatment options, and if necessary, the need for more tests. Vaccines  Your health care provider may recommend certain vaccines, such as: Influenza vaccine. This is recommended every year. Tetanus, diphtheria, and acellular pertussis (Tdap, Td) vaccine. You may need a Td booster every 10 years. Zoster vaccine. You may need this after age 28. Pneumococcal 13-valent conjugate (PCV13) vaccine. One dose is recommended after age 37. Pneumococcal polysaccharide (PPSV23) vaccine. One dose is recommended after age 80. Talk to your health care provider about which screenings and vaccines you need and how often you need them. This information is not intended to replace advice given to you by your health care provider. Make sure you discuss any questions you have with your health care provider. Document Released: 03/08/2015 Document Revised: 10/30/2015 Document Reviewed: 12/11/2014 Elsevier Interactive Patient Education  2017 [employer].  Fall Prevention in the Home Falls can cause injuries. They can happen to people of all ages. There are many things you can do to make your home safe and to help prevent falls. What can I do on the outside of my home? Regularly fix the edges of walkways and driveways and fix any cracks. Remove anything that might make you trip as you walk through a door, such as a raised step or threshold. Trim any bushes or trees on the path to your home. Use bright outdoor lighting. Clear any walking paths of anything that might make someone trip, such as rocks or tools. Regularly check to see if handrails are loose or broken. Make sure that both sides of any steps have handrails. Any raised decks and porches should have guardrails on the edges. Have any leaves, snow, or ice cleared regularly. Use sand or salt on walking paths during winter. Clean up any spills in your garage right away. This includes oil or grease spills. What can I do in the  bathroom? Use night lights. Install grab bars by the toilet and in the tub and shower. Do not use towel bars as grab bars. Use non-skid mats or decals in the tub or shower. If you need to sit down in the shower, use a plastic, non-slip stool. Keep the floor dry. Clean up any water that spills on the floor as soon as it happens. Remove soap buildup in the tub or shower regularly. Attach bath mats securely with double-sided non-slip rug tape. Do not have throw rugs and other things on the floor that can make you trip. What can I do in the bedroom? Use night lights. Make sure that you have a light by your bed that is easy to reach. Do not use any sheets or blankets that are too big for your bed. They should not hang down onto the floor. Have a firm chair that has side arms. You can use this for support while you get dressed. Do not have throw rugs and other things on the floor that can make you trip. What can I do in the kitchen? Clean up any spills right away. Avoid walking on wet floors. Keep items  that you use a lot in easy-to-reach places. If you need to reach something above you, use a strong step stool that has a grab bar. Keep electrical cords out of the way. Do not use floor polish or wax that makes floors slippery. If you must use wax, use non-skid floor wax. Do not have throw rugs and other things on the floor that can make you trip. What can I do with my stairs? Do not leave any items on the stairs. Make sure that there are handrails on both sides of the stairs and use them. Fix handrails that are broken or loose. Make sure that handrails are as long as the stairways. Check any carpeting to make sure that it is firmly attached to the stairs. Fix any carpet that is loose or worn. Avoid having throw rugs at the top or bottom of the stairs. If you do have throw rugs, attach them to the floor with carpet tape. Make sure that you have a light switch at the top of the stairs and the  bottom of the stairs. If you do not have them, ask someone to add them for you. What else can I do to help prevent falls? Wear shoes that: Do not have high heels. Have rubber bottoms. Are comfortable and fit you well. Are closed at the toe. Do not wear sandals. If you use a stepladder: Make sure that it is fully opened. Do not climb a closed stepladder. Make sure that both sides of the stepladder are locked into place. Ask someone to hold it for you, if possible. Clearly mark and make sure that you can see: Any grab bars or handrails. First and last steps. Where the edge of each step is. Use tools that help you move around (mobility aids) if they are needed. These include: Canes. Walkers. Scooters. Crutches. Turn on the lights when you go into a dark area. Replace any light bulbs as soon as they burn out. Set up your furniture so you have a clear path. Avoid moving your furniture around. If any of your floors are uneven, fix them. If there are any pets around you, be aware of where they are. Review your medicines with your doctor. Some medicines can make you feel dizzy. This can increase your chance of falling. Ask your doctor what other things that you can do to help prevent falls. This information is not intended to replace advice given to you by your health care provider. Make sure you discuss any questions you have with your health care provider. Document Released: 12/06/2008 Document Revised: 07/18/2015 Document Reviewed: 03/16/2014 Elsevier Interactive Patient Education  2017 [employer].

## 2022-08-03 NOTE — Progress Notes (Signed)
Subjective:   Grant Berry is a 77 y.o. male who presents for Medicare Annual/Subsequent preventive examination. I connected with  Grant Berry on 08/03/22 by a audio enabled telemedicine application and verified that I am speaking with the correct person using two identifiers.  Patient Location: Home  Provider Location: Home Office  I discussed the limitations of evaluation and management by telemedicine. The patient expressed understanding and agreed to proceed.  Review of Systems     Cardiac Risk Factors include: advanced age (>6men, >66 women);male gender;dyslipidemia;hypertension     Objective:    Today's Vitals   08/03/22 1323  Weight: 137 lb (62.1 kg)  Height: 5\' 7"  (1.702 m)   Body mass index is 21.46 kg/m.     08/03/2022    1:25 PM 07/31/2021    2:33 PM 07/30/2020    2:31 PM 09/02/2019   11:06 AM  Advanced Directives  Does Patient Have a Medical Advance Directive? No Yes No No  Type of Special educational needs teacher of [place];Living will    Copy of Healthcare Power of Attorney in Chart?  No - copy requested    Would patient like information on creating a medical advance directive? No - Patient declined  Yes (MAU/Ambulatory/Procedural Areas - Information given) No - Patient declined    Current Medications (verified) Outpatient Encounter Medications as of 08/03/2022  Medication Sig   amLODipine (NORVASC) 10 MG tablet Take 1 tablet (10 mg total) by mouth daily.   B Complex-C (B-COMPLEX WITH VITAMIN C) tablet Take 1 tablet by mouth daily.   ezetimibe (ZETIA) 10 MG tablet Take 1 tablet (10 mg total) by mouth daily.   KRILL OIL PO Take by mouth daily.   No facility-administered encounter medications on file as of 08/03/2022.    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   Hyperlipidemia    Hypertension    Past Surgical History:   Procedure Laterality Date   EYE SURGERY Bilateral    TONSILECTOMY/ADENOIDECTOMY WITH MYRINGOTOMY     Family History  Problem Relation Age of Onset   Stroke Mother    Coronary artery disease Mother    Coronary artery disease Father    Diabetes Maternal Grandfather    Social History   Socioeconomic History   Marital status: Widowed    Spouse name: Not on file   Number of children: 1   Years of education: Not on file   Highest education level: Not on file  Occupational History   Occupation: retired   Tobacco Use   Smoking status: Former    Types: Cigarettes    Quit date: 05/24/1988    Years since quitting: 34.2   Smokeless tobacco: Never  Vaping Use   Vaping Use: Never used  Substance and Sexual Activity   Alcohol use: No   Drug use: No   Sexual activity: Not on file  Other Topics Concern   Not on file  Social History Narrative   Live at home = son stays there    Right Handed   Drinks 1-2 cups of caffeine/daily   Stroke in 2021, 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 and has to use a walker   Social Determinants of Health   Financial Resource Strain: Low Risk  (08/03/2022)   Overall Financial Resource Strain (CARDIA)    Difficulty of Paying Living Expenses: Not hard at all  Food Insecurity: No Food Insecurity (08/03/2022)  Hunger Vital Sign    Worried About Running Out of Food in the Last Year: Never true    Ran Out of Food in the Last Year: Never true  Transportation Needs: No Transportation Needs (08/03/2022)   PRAPARE - Administrator, Civil Service (Medical): No    Lack of Transportation (Non-Medical): No  Physical Activity: Insufficiently Active (08/03/2022)   Exercise Vital Sign    Days of Exercise per Week: 3 days    Minutes of Exercise per Session: 30 min  Stress: No Stress Concern Present (08/03/2022)   [employer] of Occupational Health - Occupational Stress Questionnaire    Feeling of Stress : Not at all  Social Connections: Socially Isolated  (08/03/2022)   Social Connection and Isolation Panel [NHANES]    Frequency of Communication with Friends and Family: More than three times a week    Frequency of Social Gatherings with Friends and Family: More than three times a week    Attends Religious Services: Never    Database administrator or Organizations: No    Attends Banker Meetings: Never    Marital Status: Widowed    Tobacco Counseling Counseling given: Not Answered   Clinical Intake:  Pre-visit preparation completed: Yes  Pain : No/denies pain     Nutritional Risks: None Diabetes: No  How often do you need to have someone help you when you read instructions, pamphlets, or other written materials from your doctor or pharmacy?: 1 - Never  Diabetic?no   Interpreter Needed?: No  Information entered by :: Renie Ora, LPN   Activities of Daily Living    08/03/2022    1:26 PM  In your present state of health, do you have any difficulty performing the following activities:  Hearing? 0  Vision? 0  Difficulty concentrating or making decisions? 0  Walking or climbing stairs? 0  Dressing or bathing? 0  Doing errands, shopping? 0  Preparing Food and eating ? N  Using the Toilet? N  In the past six months, have you accidently leaked urine? N  Do you have problems with loss of bowel control? N  Managing your Medications? N  Managing your Finances? N  Housekeeping or managing your Housekeeping? N    Patient Care Team: Gabriel Earing, FNP as PCP - General (Family Medicine) Michaelle Copas, MD as Referring Physician (Optometry)  Indicate any recent Medical Services you may have received from other than [facility] providers in the past year (date may be approximate).     Assessment:   This is a routine wellness examination for Grant Berry.  Hearing/Vision screen Vision Screening - Comments:: Wears rx glasses - up to date with routine eye exams with  Dr.Lee   Dietary issues and exercise activities  discussed: Current Exercise Habits: The patient does not participate in regular exercise at present, Exercise limited by: orthopedic condition(s)   Goals Addressed             This Visit's Progress    DIET - INCREASE WATER INTAKE         Depression Screen    08/03/2022    1:25 PM 08/03/2022    1:24 PM 07/10/2022    9:57 AM 01/08/2022   10:38 AM 07/31/2021    2:19 PM 05/29/2021   10:10 AM 11/29/2020   10:11 AM  PHQ 2/9 Scores  PHQ - 2 Score 0 0 0 0 1 0 0  PHQ- 9 Score 0 0 0 0  0 0    Fall Risk    08/03/2022    1:23 PM 07/10/2022    9:57 AM 01/08/2022   10:38 AM 07/31/2021    2:18 PM 05/29/2021   10:10 AM  Fall Risk   Falls in the past year? 0 0 0 0 0  Number falls in past yr: 0   0   Injury with Fall? 0   0   Risk for fall due to : No Fall Risks   Impaired balance/gait;Orthopedic patient   Follow up Falls prevention discussed   Education provided;Falls prevention discussed     FALL RISK PREVENTION PERTAINING TO THE HOME:  Any stairs in or around the home? No  If so, are there any without handrails? No  Home free of loose throw rugs in walkways, pet beds, electrical cords, etc? Yes  Adequate lighting in your home to reduce risk of falls? Yes   ASSISTIVE DEVICES UTILIZED TO PREVENT FALLS:  Life alert? No  Use of a cane, walker or w/c? Yes  Grab bars in the bathroom? Yes  Shower chair or bench in shower? No  Elevated toilet seat or a handicapped toilet? No        08/03/2022    1:26 PM 07/31/2021    2:37 PM 07/30/2020    2:28 PM  6CIT Screen  What Year? 0 points 0 points 0 points  What month? 0 points 0 points 0 points  What time? 0 points 0 points 0 points  Count back from 20 0 points 0 points 0 points  Months in reverse 0 points 0 points 2 points  Repeat phrase 0 points 0 points 4 points  Total Score 0 points 0 points 6 points    Immunizations Immunization History  Administered Date(s) Administered   Fluad Quad(high Dose 65+) 01/22/2020, 11/29/2020, 01/08/2022    Influenza,inj,Quad PF,6+ Mos 12/26/2012   Pneumococcal Conjugate-13 10/17/2019   Pneumococcal Polysaccharide-23 12/26/2012   Tdap 01/08/2022    TDAP status: Up to date  Flu Vaccine status: Up to date  Pneumococcal vaccine status: Up to date  Covid-19 vaccine status: Declined, Education has been provided regarding the importance of this vaccine but patient still declined. Advised may receive this vaccine at local pharmacy or Health Dept.or vaccine clinic. Aware to provide a copy of the vaccination record if obtained from local pharmacy or Health Dept. Verbalized acceptance and understanding.  Qualifies for Shingles Vaccine? Yes   Zostavax completed No   Shingrix Completed?: No.    Education has been provided regarding the importance of this vaccine. Patient has been advised to call insurance company to determine out of pocket expense if they have not yet received this vaccine. Advised may also receive vaccine at local pharmacy or Health Dept. Verbalized acceptance and understanding.  Screening Tests Health Maintenance  Topic Date Due   Zoster Vaccines- Shingrix (1 of 2) 10/10/2022 (Originally 08/10/1996)   Fecal DNA (Cologuard)  01/09/2023 (Originally 08/11/1991)   COVID-19 Vaccine (1) 01/25/2023 (Originally 02/10/1946)   Hepatitis C Screening  07/10/2023 (Originally 08/10/1963)   INFLUENZA VACCINE  09/24/2022   Medicare Annual Wellness (AWV)  08/03/2023   DTaP/Tdap/Td (2 - Td or Tdap) 01/09/2032   Pneumonia Vaccine 38+ Years old  Completed   HPV VACCINES  Aged Out    Health Maintenance  There are no preventive care reminders to display for this patient.   Colorectal cancer screening: No longer required.   Lung Cancer Screening: (Low Dose CT Chest recommended if Age 49-80  years, 30 pack-year currently smoking OR have quit w/in 15years.) does not qualify.   Lung Cancer Screening Referral: n/a  Additional Screening:  Hepatitis C Screening: does not qualify;   Vision  Screening: Recommended annual ophthalmology exams for early detection of glaucoma and other disorders of the eye. Is the patient up to date with their annual eye exam?  Yes  Who is the provider or what is the name of the office in which the patient attends annual eye exams? Dr.Lee  If pt is not established with a provider, would they like to be referred to a provider to establish care? No .   Dental Screening: Recommended annual dental exams for proper oral hygiene  Community Resource Referral / Chronic Care Management: CRR required this visit?  No   CCM required this visit?  No      Plan:     I have personally reviewed and noted the following in the patient's chart:   Medical and social history Use of alcohol, tobacco or illicit drugs  Current medications and supplements including opioid prescriptions. Patient is not currently taking opioid prescriptions. Functional ability and status Nutritional status Physical activity Advanced directives List of other physicians Hospitalizations, surgeries, and ER visits in previous 12 months Vitals Screenings to include cognitive, depression, and falls Referrals and appointments  In addition, I have reviewed and discussed with patient certain preventive protocols, quality metrics, and best practice recommendations. A written personalized care plan for preventive services as well as general preventive health recommendations were provided to patient.     Lorrene Reid, LPN   [id number]/61/0960   Nurse Notes: none

## 2022-10-23 ENCOUNTER — Ambulatory Visit (INDEPENDENT_AMBULATORY_CARE_PROVIDER_SITE_OTHER): Payer: Medicare Other | Admitting: Family Medicine

## 2022-10-23 ENCOUNTER — Encounter: Payer: Self-pay | Admitting: Family Medicine

## 2022-10-23 VITALS — BP 107/65 | HR 91 | Temp 98.4°F | Ht 67.0 in | Wt 133.1 lb

## 2022-10-23 DIAGNOSIS — E782 Mixed hyperlipidemia: Secondary | ICD-10-CM

## 2022-10-23 DIAGNOSIS — Z789 Other specified health status: Secondary | ICD-10-CM | POA: Diagnosis not present

## 2022-10-23 DIAGNOSIS — I10 Essential (primary) hypertension: Secondary | ICD-10-CM | POA: Diagnosis not present

## 2022-10-23 DIAGNOSIS — I6930 Unspecified sequelae of cerebral infarction: Secondary | ICD-10-CM

## 2022-10-23 MED ORDER — BEMPEDOIC ACID-EZETIMIBE 180-10 MG PO TABS: 1.0000 | ORAL_TABLET | Freq: Every day | ORAL | 3 refills | Status: DC

## 2022-10-23 NOTE — Progress Notes (Signed)
Established Patient Office Visit  Subjective   Patient ID: Grant Berry, male    DOB: 03/03/1945  Age: 77 y.o. MRN: [id number]  Chief Complaint  Patient presents with   Medical Management of Chronic Issues   Hypertension    HPI HTN Complaint with meds - Yes Current Medications - amlodipine 5 mg Checking BP at home ranging 100-110s/60s Pertinent ROS:  Headache - No Fatigue - No Visual Disturbances - No Chest pain - No Dyspnea - No Palpitations - No LE edema - No  2. HLD Statin intolerance. On zetia. Last LDL not at goal. Declines repatha as he is not interested in an injectable.   3. History of CVA History of stroke 2 years ago. He has residual weakness of his left side, dizziness, and mobility difficulty. He was evaluated by neurology outpatient. They discontinued his plavix. Instructed to continue aspirin 81 mg daily. Recommended LDL goal is <70. Last LDL at 131.  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    Objective:     BP 107/65 Comment: at home reading per pt  Pulse 91   Temp 98.4 F (36.9 C) (Temporal)   Ht 5\' 7"  (1.702 m)   Wt 133 lb 2 oz (60.4 kg)   SpO2 94%   BMI 20.85 kg/m  BP Readings from Last 3 Encounters:  10/23/22 107/65  07/24/22 135/74  07/10/22 (!) 152/87     Physical Exam Vitals and nursing note reviewed.  Constitutional:      General: He is not in acute distress.    Appearance: He is not ill-appearing, toxic-appearing or diaphoretic.  HENT:     Right Ear: Tympanic membrane, ear canal and external ear normal.     Left Ear: Tympanic membrane, ear canal and external ear normal.     Nose: Nose normal.     Mouth/Throat:     Mouth: Mucous membranes are moist.     Pharynx: Oropharynx is clear.  Eyes:     Extraocular Movements: Extraocular movements intact.     Conjunctiva/sclera: Conjunctivae normal.     Pupils: Pupils are equal,  round, and reactive to light.  Neck:     Thyroid: No thyroid mass, thyromegaly or thyroid tenderness.  Cardiovascular:     Rate and Rhythm: Normal rate and regular rhythm.     Heart sounds: Normal heart sounds. No murmur heard. Pulmonary:     Effort: Pulmonary effort is normal. No respiratory distress.     Breath sounds: Normal breath sounds.  Abdominal:     General: Bowel sounds are normal. There is no distension.     Palpations: Abdomen is soft.     Tenderness: There is no abdominal tenderness.  Musculoskeletal:     Cervical back: Neck supple. No rigidity.     Right lower leg: No edema.     Left lower leg: No edema.  Skin:    General: Skin is warm and dry.  Neurological:     Mental Status: He is alert and oriented to person, place, and time.     Motor: Weakness (residual weakness to left upper of lower extremity) present.     Coordination: Coordination normal.     Gait: Gait abnormal (arrives with walker).  Psychiatric:        Mood and Affect: Mood normal.        Behavior: Behavior normal.  No results found for any visits on 10/23/22.    The ASCVD Risk score (Arnett DK, et al., 2019) failed to calculate for the following reasons:   The patient has a prior MI or stroke diagnosis    Assessment & Plan:   Grant Berry was seen today for medical management of chronic issues and hypertension.  Diagnoses and all orders for this visit:  Primary hypertension Well controlled on current regimen.   Mixed hyperlipidemia Statin intolerance Currently on zetia. Not at goal with last LDL at 131, goal <70. Statin intolerant. Will attempt to switch to nexlizet if covered by insurance given hx of CVA. He has declined repatha.  -     Bempedoic Acid-Ezetimibe 180-10 MG TABS; Take 1 tablet by mouth daily.  History of cerebrovascular accident (CVA) with residual deficit -     Bempedoic Acid-Ezetimibe 180-10 MG TABS; Take 1 tablet by mouth daily.  Return in about 6 months (around  04/23/2023) for CPE.   The patient indicates understanding of these issues and agrees with the plan.  Gabriel Earing, FNP

## 2022-12-15 ENCOUNTER — Other Ambulatory Visit: Payer: Self-pay | Admitting: Family Medicine

## 2022-12-15 DIAGNOSIS — E782 Mixed hyperlipidemia: Secondary | ICD-10-CM

## 2022-12-15 DIAGNOSIS — Z789 Other specified health status: Secondary | ICD-10-CM

## 2023-01-31 ENCOUNTER — Inpatient Hospital Stay (HOSPITAL_COMMUNITY): Payer: Medicare Other

## 2023-01-31 ENCOUNTER — Emergency Department (HOSPITAL_COMMUNITY): Payer: Medicare Other

## 2023-01-31 ENCOUNTER — Inpatient Hospital Stay (HOSPITAL_COMMUNITY): Payer: Medicare Other | Admitting: Certified Registered Nurse Anesthetist

## 2023-01-31 ENCOUNTER — Other Ambulatory Visit: Payer: Self-pay

## 2023-01-31 ENCOUNTER — Other Ambulatory Visit (HOSPITAL_COMMUNITY): Payer: Medicare Other

## 2023-01-31 ENCOUNTER — Encounter (HOSPITAL_COMMUNITY): Payer: Self-pay

## 2023-01-31 ENCOUNTER — Encounter (HOSPITAL_COMMUNITY)
Admission: EM | Disposition: A | Discharge status: Skilled Nursing Facility | Payer: Self-pay | Source: Home / Self Care | Attending: Neurology

## 2023-01-31 ENCOUNTER — Inpatient Hospital Stay (HOSPITAL_COMMUNITY)
Admission: EM | Admit: 2023-01-31 | Discharge: 2023-02-11 | DRG: 023 | Disposition: A | Discharge status: Skilled Nursing Facility | Payer: Medicare Other | Attending: Neurology | Admitting: Neurology

## 2023-01-31 DIAGNOSIS — F05 Delirium due to known physiological condition: Secondary | ICD-10-CM | POA: Diagnosis not present

## 2023-01-31 DIAGNOSIS — I700 Atherosclerosis of aorta: Secondary | ICD-10-CM | POA: Diagnosis not present

## 2023-01-31 DIAGNOSIS — Z8249 Family history of ischemic heart disease and other diseases of the circulatory system: Secondary | ICD-10-CM

## 2023-01-31 DIAGNOSIS — N319 Neuromuscular dysfunction of bladder, unspecified: Secondary | ICD-10-CM | POA: Diagnosis not present

## 2023-01-31 DIAGNOSIS — G936 Cerebral edema: Secondary | ICD-10-CM | POA: Diagnosis not present

## 2023-01-31 DIAGNOSIS — I69398 Other sequelae of cerebral infarction: Secondary | ICD-10-CM | POA: Diagnosis not present

## 2023-01-31 DIAGNOSIS — I63532 Cerebral infarction due to unspecified occlusion or stenosis of left posterior cerebral artery: Secondary | ICD-10-CM | POA: Diagnosis not present

## 2023-01-31 DIAGNOSIS — I9581 Postprocedural hypotension: Secondary | ICD-10-CM | POA: Diagnosis not present

## 2023-01-31 DIAGNOSIS — I672 Cerebral atherosclerosis: Secondary | ICD-10-CM | POA: Diagnosis not present

## 2023-01-31 DIAGNOSIS — Z823 Family history of stroke: Secondary | ICD-10-CM | POA: Diagnosis not present

## 2023-01-31 DIAGNOSIS — I639 Cerebral infarction, unspecified: Secondary | ICD-10-CM

## 2023-01-31 DIAGNOSIS — R9089 Other abnormal findings on diagnostic imaging of central nervous system: Secondary | ICD-10-CM | POA: Diagnosis not present

## 2023-01-31 DIAGNOSIS — I6523 Occlusion and stenosis of bilateral carotid arteries: Secondary | ICD-10-CM | POA: Diagnosis not present

## 2023-01-31 DIAGNOSIS — I63432 Cerebral infarction due to embolism of left posterior cerebral artery: Secondary | ICD-10-CM | POA: Diagnosis not present

## 2023-01-31 DIAGNOSIS — Z7401 Bed confinement status: Secondary | ICD-10-CM | POA: Diagnosis not present

## 2023-01-31 DIAGNOSIS — I663 Occlusion and stenosis of cerebellar arteries: Secondary | ICD-10-CM | POA: Diagnosis not present

## 2023-01-31 DIAGNOSIS — R404 Transient alteration of awareness: Secondary | ICD-10-CM | POA: Diagnosis not present

## 2023-01-31 DIAGNOSIS — I63512 Cerebral infarction due to unspecified occlusion or stenosis of left middle cerebral artery: Secondary | ICD-10-CM | POA: Diagnosis not present

## 2023-01-31 DIAGNOSIS — M47812 Spondylosis without myelopathy or radiculopathy, cervical region: Secondary | ICD-10-CM | POA: Diagnosis not present

## 2023-01-31 DIAGNOSIS — R6889 Other general symptoms and signs: Secondary | ICD-10-CM | POA: Diagnosis not present

## 2023-01-31 DIAGNOSIS — I609 Nontraumatic subarachnoid hemorrhage, unspecified: Secondary | ICD-10-CM | POA: Diagnosis not present

## 2023-01-31 DIAGNOSIS — Z833 Family history of diabetes mellitus: Secondary | ICD-10-CM

## 2023-01-31 DIAGNOSIS — R4701 Aphasia: Secondary | ICD-10-CM | POA: Diagnosis present

## 2023-01-31 DIAGNOSIS — I6522 Occlusion and stenosis of left carotid artery: Secondary | ICD-10-CM | POA: Diagnosis present

## 2023-01-31 DIAGNOSIS — W19XXXA Unspecified fall, initial encounter: Secondary | ICD-10-CM | POA: Diagnosis present

## 2023-01-31 DIAGNOSIS — I69393 Ataxia following cerebral infarction: Secondary | ICD-10-CM

## 2023-01-31 DIAGNOSIS — Z87891 Personal history of nicotine dependence: Secondary | ICD-10-CM

## 2023-01-31 DIAGNOSIS — S0990XA Unspecified injury of head, initial encounter: Secondary | ICD-10-CM | POA: Diagnosis not present

## 2023-01-31 DIAGNOSIS — R296 Repeated falls: Secondary | ICD-10-CM | POA: Diagnosis not present

## 2023-01-31 DIAGNOSIS — Z888 Allergy status to other drugs, medicaments and biological substances status: Secondary | ICD-10-CM | POA: Diagnosis not present

## 2023-01-31 DIAGNOSIS — I6389 Other cerebral infarction: Secondary | ICD-10-CM | POA: Diagnosis not present

## 2023-01-31 DIAGNOSIS — Y92009 Unspecified place in unspecified non-institutional (private) residence as the place of occurrence of the external cause: Secondary | ICD-10-CM | POA: Diagnosis not present

## 2023-01-31 DIAGNOSIS — I611 Nontraumatic intracerebral hemorrhage in hemisphere, cortical: Secondary | ICD-10-CM | POA: Diagnosis not present

## 2023-01-31 DIAGNOSIS — R531 Weakness: Secondary | ICD-10-CM | POA: Diagnosis not present

## 2023-01-31 DIAGNOSIS — I608 Other nontraumatic subarachnoid hemorrhage: Secondary | ICD-10-CM | POA: Diagnosis not present

## 2023-01-31 DIAGNOSIS — Z743 Need for continuous supervision: Secondary | ICD-10-CM | POA: Diagnosis not present

## 2023-01-31 DIAGNOSIS — I63439 Cerebral infarction due to embolism of unspecified posterior cerebral artery: Secondary | ICD-10-CM

## 2023-01-31 DIAGNOSIS — S06320A Contusion and laceration of left cerebrum without loss of consciousness, initial encounter: Secondary | ICD-10-CM | POA: Diagnosis not present

## 2023-01-31 DIAGNOSIS — N179 Acute kidney failure, unspecified: Secondary | ICD-10-CM | POA: Diagnosis present

## 2023-01-31 DIAGNOSIS — R471 Dysarthria and anarthria: Secondary | ICD-10-CM | POA: Diagnosis present

## 2023-01-31 DIAGNOSIS — R42 Dizziness and giddiness: Secondary | ICD-10-CM | POA: Diagnosis not present

## 2023-01-31 DIAGNOSIS — E785 Hyperlipidemia, unspecified: Secondary | ICD-10-CM | POA: Diagnosis not present

## 2023-01-31 DIAGNOSIS — R29706 NIHSS score 6: Secondary | ICD-10-CM | POA: Diagnosis present

## 2023-01-31 DIAGNOSIS — H543 Unqualified visual loss, both eyes: Secondary | ICD-10-CM | POA: Diagnosis present

## 2023-01-31 DIAGNOSIS — I610 Nontraumatic intracerebral hemorrhage in hemisphere, subcortical: Secondary | ICD-10-CM | POA: Diagnosis not present

## 2023-01-31 DIAGNOSIS — G319 Degenerative disease of nervous system, unspecified: Secondary | ICD-10-CM | POA: Diagnosis not present

## 2023-01-31 DIAGNOSIS — Z79899 Other long term (current) drug therapy: Secondary | ICD-10-CM

## 2023-01-31 DIAGNOSIS — R4781 Slurred speech: Secondary | ICD-10-CM | POA: Diagnosis not present

## 2023-01-31 DIAGNOSIS — E782 Mixed hyperlipidemia: Secondary | ICD-10-CM | POA: Diagnosis not present

## 2023-01-31 DIAGNOSIS — M542 Cervicalgia: Secondary | ICD-10-CM | POA: Diagnosis not present

## 2023-01-31 DIAGNOSIS — H53461 Homonymous bilateral field defects, right side: Secondary | ICD-10-CM | POA: Diagnosis not present

## 2023-01-31 DIAGNOSIS — I651 Occlusion and stenosis of basilar artery: Secondary | ICD-10-CM | POA: Diagnosis present

## 2023-01-31 DIAGNOSIS — I10 Essential (primary) hypertension: Secondary | ICD-10-CM | POA: Diagnosis present

## 2023-01-31 DIAGNOSIS — I6782 Cerebral ischemia: Secondary | ICD-10-CM | POA: Diagnosis not present

## 2023-01-31 DIAGNOSIS — I69320 Aphasia following cerebral infarction: Secondary | ICD-10-CM | POA: Diagnosis not present

## 2023-01-31 HISTORY — PX: IR INTRA CRAN STENT: IMG2345

## 2023-01-31 HISTORY — PX: RADIOLOGY WITH ANESTHESIA: SHX6223

## 2023-01-31 HISTORY — PX: IR INTRAVSC STENT CERV CAROTID W/O EMB-PROT MOD SED INC ANGIO: IMG2304

## 2023-01-31 HISTORY — PX: IR CT HEAD LTD: IMG2386

## 2023-01-31 HISTORY — PX: IR PERCUTANEOUS ART THROMBECTOMY/INFUSION INTRACRANIAL INC DIAG ANGIO: IMG6087

## 2023-01-31 LAB — LIPID PANEL
Cholesterol: 235 mg/dL — ABNORMAL HIGH (ref 0–200)
HDL: 59 mg/dL (ref >40)
LDL Cholesterol: 167 mg/dL — ABNORMAL HIGH (ref 0–99)
Total CHOL/HDL Ratio: 4 {ratio}
Triglycerides: 47 mg/dL (ref <150)
VLDL: 9 mg/dL (ref 0–40)

## 2023-01-31 LAB — DIFFERENTIAL
Abs Immature Granulocytes: 0.02 10*3/uL (ref 0.00–0.07)
Basophils Absolute: 0 10*3/uL (ref 0.0–0.1)
Basophils Relative: 1 %
Eosinophils Absolute: 0.1 10*3/uL (ref 0.0–0.5)
Eosinophils Relative: 2 %
Immature Granulocytes: 0 %
Lymphocytes Relative: 21 %
Lymphs Abs: 1.4 10*3/uL (ref 0.7–4.0)
Monocytes Absolute: 0.6 10*3/uL (ref 0.1–1.0)
Monocytes Relative: 9 %
Neutro Abs: 4.5 10*3/uL (ref 1.7–7.7)
Neutrophils Relative %: 67 %

## 2023-01-31 LAB — URINALYSIS, ROUTINE W REFLEX MICROSCOPIC
Bilirubin Urine: NEGATIVE
Glucose, UA: NEGATIVE mg/dL
Hgb urine dipstick: NEGATIVE
Ketones, ur: 5 mg/dL — AB
Leukocytes,Ua: NEGATIVE
Nitrite: NEGATIVE
Protein, ur: NEGATIVE mg/dL
Specific Gravity, Urine: 1.017 (ref 1.005–1.030)
pH: 5 (ref 5.0–8.0)

## 2023-01-31 LAB — COMPREHENSIVE METABOLIC PANEL
ALT: 15 U/L (ref 0–44)
AST: 27 U/L (ref 15–41)
Albumin: 4 g/dL (ref 3.5–5.0)
Alkaline Phosphatase: 55 U/L (ref 38–126)
Anion gap: 11 (ref 5–15)
BUN: 27 mg/dL — ABNORMAL HIGH (ref 8–23)
CO2: 24 mmol/L (ref 22–32)
Calcium: 9.7 mg/dL (ref 8.9–10.3)
Chloride: 104 mmol/L (ref 98–111)
Creatinine, Ser: 1.33 mg/dL — ABNORMAL HIGH (ref 0.61–1.24)
GFR, Estimated: 55 mL/min — ABNORMAL LOW (ref >60)
Glucose, Bld: 102 mg/dL — ABNORMAL HIGH (ref 70–99)
Potassium: 3.5 mmol/L (ref 3.5–5.1)
Sodium: 139 mmol/L (ref 135–145)
Total Bilirubin: 0.5 mg/dL (ref <1.2)
Total Protein: 7.2 g/dL (ref 6.5–8.1)

## 2023-01-31 LAB — PROTIME-INR
INR: 1 (ref 0.8–1.2)
Prothrombin Time: 13.6 s (ref 11.4–15.2)

## 2023-01-31 LAB — HEMOGLOBIN A1C
Hgb A1c MFr Bld: 5.1 % (ref 4.8–5.6)
Mean Plasma Glucose: 99.67 mg/dL

## 2023-01-31 LAB — CBC
HCT: 37.6 % — ABNORMAL LOW (ref 39.0–52.0)
HCT: 38.9 % — ABNORMAL LOW (ref 39.0–52.0)
Hemoglobin: 12.5 g/dL — ABNORMAL LOW (ref 13.0–17.0)
Hemoglobin: 12.9 g/dL — ABNORMAL LOW (ref 13.0–17.0)
MCH: 29.7 pg (ref 26.0–34.0)
MCH: 30.1 pg (ref 26.0–34.0)
MCHC: 33.2 g/dL (ref 30.0–36.0)
MCHC: 33.2 g/dL (ref 30.0–36.0)
MCV: 89.6 fL (ref 80.0–100.0)
MCV: 90.6 fL (ref 80.0–100.0)
Platelets: 192 10*3/uL (ref 150–400)
Platelets: 196 10*3/uL (ref 150–400)
RBC: 4.15 MIL/uL — ABNORMAL LOW (ref 4.22–5.81)
RBC: 4.34 MIL/uL (ref 4.22–5.81)
RDW: 14.3 % (ref 11.5–15.5)
RDW: 14.4 % (ref 11.5–15.5)
WBC: 6.4 10*3/uL (ref 4.0–10.5)
WBC: 6.7 10*3/uL (ref 4.0–10.5)
nRBC: 0 % (ref 0.0–0.2)
nRBC: 0 % (ref 0.0–0.2)

## 2023-01-31 LAB — RAPID URINE DRUG SCREEN, HOSP PERFORMED
Amphetamines: NOT DETECTED
Barbiturates: NOT DETECTED
Benzodiazepines: NOT DETECTED
Cocaine: NOT DETECTED
Opiates: NOT DETECTED
Tetrahydrocannabinol: NOT DETECTED

## 2023-01-31 LAB — ETHANOL: Alcohol, Ethyl (B): <10 mg/dL (ref <10)

## 2023-01-31 LAB — MRSA NEXT GEN BY PCR, NASAL: MRSA by PCR Next Gen: NOT DETECTED

## 2023-01-31 LAB — I-STAT CHEM 8, ED
BUN: 27 mg/dL — ABNORMAL HIGH (ref 8–23)
Calcium, Ion: 1.2 mmol/L (ref 1.15–1.40)
Chloride: 102 mmol/L (ref 98–111)
Creatinine, Ser: 1.4 mg/dL — ABNORMAL HIGH (ref 0.61–1.24)
Glucose, Bld: 102 mg/dL — ABNORMAL HIGH (ref 70–99)
HCT: 38 % — ABNORMAL LOW (ref 39.0–52.0)
Hemoglobin: 12.9 g/dL — ABNORMAL LOW (ref 13.0–17.0)
Potassium: 3.6 mmol/L (ref 3.5–5.1)
Sodium: 139 mmol/L (ref 135–145)
TCO2: 23 mmol/L (ref 22–32)

## 2023-01-31 LAB — APTT: aPTT: 26 s (ref 24–36)

## 2023-01-31 SURGERY — IR WITH ANESTHESIA
Anesthesia: General

## 2023-01-31 MED ORDER — EZETIMIBE 10 MG PO TABS
10.0000 mg | ORAL_TABLET | Freq: Every day | ORAL | Status: DC
  Administered 2023-02-01 – 2023-02-11 (×11): 10 mg via ORAL
  Filled 2023-01-31 (×11): qty 1

## 2023-01-31 MED ORDER — ALBUMIN HUMAN 5 % IV SOLN
12.5000 g | Freq: Four times a day (QID) | INTRAVENOUS | Status: DC
  Administered 2023-01-31 – 2023-02-01 (×3): 12.5 g via INTRAVENOUS
  Filled 2023-01-31 (×3): qty 250

## 2023-01-31 MED ORDER — IOHEXOL 300 MG/ML  SOLN
150.0000 mL | Freq: Once | INTRAMUSCULAR | Status: AC | PRN
  Administered 2023-01-31: 90 mL via INTRA_ARTERIAL

## 2023-01-31 MED ORDER — ROCURONIUM BROMIDE 10 MG/ML (PF) SYRINGE
PREFILLED_SYRINGE | INTRAVENOUS | Status: DC | PRN
  Administered 2023-01-31: 50 mg via INTRAVENOUS
  Administered 2023-01-31: 20 mg via INTRAVENOUS

## 2023-01-31 MED ORDER — NITROGLYCERIN 1 MG/10 ML FOR IR/CATH LAB
INTRA_ARTERIAL | Status: AC
  Filled 2023-01-31: qty 10

## 2023-01-31 MED ORDER — SODIUM CHLORIDE 0.9 % IV SOLN
INTRAVENOUS | Status: DC
Start: 2023-01-31 — End: 2023-01-31

## 2023-01-31 MED ORDER — CLOPIDOGREL BISULFATE 300 MG PO TABS: 300.0000 mg | ORAL_TABLET | Freq: Once | ORAL | Status: DC

## 2023-01-31 MED ORDER — PROPOFOL 10 MG/ML IV BOLUS
INTRAVENOUS | Status: DC | PRN
  Administered 2023-01-31 (×2): 100 mg via INTRAVENOUS

## 2023-01-31 MED ORDER — ACETAMINOPHEN 650 MG RE SUPP: 650.0000 mg | RECTAL | Status: DC | PRN

## 2023-01-31 MED ORDER — ORAL CARE MOUTH RINSE: 15.0000 mL | OROMUCOSAL | Status: DC | PRN

## 2023-01-31 MED ORDER — ASPIRIN 325 MG PO TABS: ORAL_TABLET | ORAL | Status: DC | PRN

## 2023-01-31 MED ORDER — ASPIRIN 81 MG PO CHEW: 81.0000 mg | CHEWABLE_TABLET | Freq: Every day | ORAL | Status: DC

## 2023-01-31 MED ORDER — PHENYLEPHRINE 80 MCG/ML (10ML) SYRINGE FOR IV PUSH (FOR BLOOD PRESSURE SUPPORT)
PREFILLED_SYRINGE | INTRAVENOUS | Status: DC | PRN
  Administered 2023-01-31: 80 ug via INTRAVENOUS

## 2023-01-31 MED ORDER — LIDOCAINE 2% (20 MG/ML) 5 ML SYRINGE
INTRAMUSCULAR | Status: DC | PRN
  Administered 2023-01-31: 80 mg via INTRAVENOUS

## 2023-01-31 MED ORDER — ASPIRIN 81 MG PO TBEC
DELAYED_RELEASE_TABLET | ORAL | Status: DC | PRN
  Administered 2023-01-31: 81 mg via ORAL

## 2023-01-31 MED ORDER — SENNOSIDES-DOCUSATE SODIUM 8.6-50 MG PO TABS
1.0000 | ORAL_TABLET | Freq: Every evening | ORAL | Status: DC | PRN
  Administered 2023-02-05: 1 via ORAL
  Filled 2023-01-31: qty 1

## 2023-01-31 MED ORDER — ASPIRIN 81 MG PO CHEW: 324.0000 mg | CHEWABLE_TABLET | Freq: Once | ORAL | Status: DC

## 2023-01-31 MED ORDER — CLOPIDOGREL BISULFATE 75 MG PO TABS
ORAL_TABLET | ORAL | Status: AC
Start: 2023-01-31 — End: ?
  Filled 2023-01-31: qty 1

## 2023-01-31 MED ORDER — ACETAMINOPHEN 325 MG PO TABS: 650.0000 mg | ORAL_TABLET | ORAL | Status: DC | PRN

## 2023-01-31 MED ORDER — CEFAZOLIN SODIUM-DEXTROSE 2-3 GM-%(50ML) IV SOLR
INTRAVENOUS | Status: DC | PRN
  Administered 2023-01-31: 2 g via INTRAVENOUS

## 2023-01-31 MED ORDER — ENOXAPARIN SODIUM 40 MG/0.4ML IJ SOSY
40.0000 mg | PREFILLED_SYRINGE | INTRAMUSCULAR | Status: DC
  Administered 2023-02-01 – 2023-02-04 (×4): 40 mg via SUBCUTANEOUS
  Filled 2023-01-31 (×4): qty 0.4

## 2023-01-31 MED ORDER — STROKE: EARLY STAGES OF RECOVERY BOOK
Freq: Once | Status: AC
  Filled 2023-01-31: qty 1

## 2023-01-31 MED ORDER — CLOPIDOGREL BISULFATE 75 MG PO TABS: 75.0000 mg | ORAL_TABLET | Freq: Every day | ORAL | Status: DC

## 2023-01-31 MED ORDER — CANGRELOR TETRASODIUM 50 MG IV SOLR
INTRAVENOUS | Status: AC
  Filled 2023-01-31: qty 50

## 2023-01-31 MED ORDER — CLOPIDOGREL BISULFATE 75 MG PO TABS
ORAL_TABLET | ORAL | Status: AC
  Filled 2023-01-31: qty 1

## 2023-01-31 MED ORDER — IOHEXOL 350 MG/ML SOLN
75.0000 mL | Freq: Once | INTRAVENOUS | Status: AC | PRN
  Administered 2023-01-31: 75 mL via INTRAVENOUS

## 2023-01-31 MED ORDER — CANGRELOR BOLUS VIA INFUSION
INTRAVENOUS | Status: DC | PRN
  Administered 2023-01-31: 811.5 ug via INTRAVENOUS

## 2023-01-31 MED ORDER — ACETAMINOPHEN 325 MG PO TABS
650.0000 mg | ORAL_TABLET | ORAL | Status: DC | PRN
Start: 2023-01-31 — End: 2023-02-11
  Administered 2023-02-02 – 2023-02-06 (×3): 650 mg via ORAL
  Filled 2023-01-31 (×3): qty 2

## 2023-01-31 MED ORDER — CLOPIDOGREL BISULFATE 300 MG PO TABS
300.0000 mg | ORAL_TABLET | Freq: Once | ORAL | Status: AC
  Administered 2023-01-31: 300 mg via ORAL
  Filled 2023-01-31: qty 1

## 2023-01-31 MED ORDER — CLEVIDIPINE BUTYRATE 0.5 MG/ML IV EMUL
INTRAVENOUS | Status: AC
  Filled 2023-01-31: qty 50

## 2023-01-31 MED ORDER — SODIUM CHLORIDE 0.9 % IV SOLN: INTRAVENOUS | Status: DC

## 2023-01-31 MED ORDER — CLOPIDOGREL BISULFATE 75 MG PO TABS
75.0000 mg | ORAL_TABLET | Freq: Every day | ORAL | Status: DC
  Administered 2023-02-01 – 2023-02-11 (×11): 75 mg via ORAL
  Filled 2023-01-31 (×11): qty 1

## 2023-01-31 MED ORDER — IOHEXOL 300 MG/ML  SOLN
50.0000 mL | Freq: Once | INTRAMUSCULAR | Status: AC | PRN
  Administered 2023-01-31: 30 mL via INTRA_ARTERIAL

## 2023-01-31 MED ORDER — ASPIRIN 81 MG PO TBEC
81.0000 mg | DELAYED_RELEASE_TABLET | Freq: Every day | ORAL | Status: DC
  Administered 2023-02-01 – 2023-02-11 (×11): 81 mg via ORAL
  Filled 2023-01-31 (×11): qty 1

## 2023-01-31 MED ORDER — SODIUM CHLORIDE (PF) 0.9 % IJ SOLN
INTRAVENOUS | Status: DC | PRN
  Administered 2023-01-31: 25 ug via INTRA_ARTERIAL

## 2023-01-31 MED ORDER — PHENYLEPHRINE HCL-NACL 20-0.9 MG/250ML-% IV SOLN
INTRAVENOUS | Status: DC | PRN
  Administered 2023-01-31: 15 ug/min via INTRAVENOUS

## 2023-01-31 MED ORDER — DEXAMETHASONE SODIUM PHOSPHATE 10 MG/ML IJ SOLN
INTRAMUSCULAR | Status: DC | PRN
  Administered 2023-01-31: 5 mg via INTRAVENOUS

## 2023-01-31 MED ORDER — LACTATED RINGERS IV SOLN: INTRAVENOUS | Status: DC | PRN

## 2023-01-31 MED ORDER — ACETAMINOPHEN 160 MG/5ML PO SOLN: 650.0000 mg | ORAL | Status: DC | PRN

## 2023-01-31 MED ORDER — IOHEXOL 350 MG/ML SOLN
40.0000 mL | Freq: Once | INTRAVENOUS | Status: AC | PRN
  Administered 2023-01-31: 40 mL via INTRAVENOUS

## 2023-01-31 MED ORDER — ASPIRIN 81 MG PO CHEW
CHEWABLE_TABLET | ORAL | Status: AC
  Filled 2023-01-31: qty 1

## 2023-01-31 MED ORDER — CEFAZOLIN SODIUM-DEXTROSE 2-4 GM/100ML-% IV SOLN
INTRAVENOUS | Status: AC
  Filled 2023-01-31: qty 100

## 2023-01-31 MED ORDER — CLOPIDOGREL BISULFATE 75 MG PO TABS
ORAL_TABLET | ORAL | Status: DC | PRN
  Administered 2023-01-31: 150 mg

## 2023-01-31 MED ORDER — CLEVIDIPINE BUTYRATE 0.5 MG/ML IV EMUL
0.0000 mg/h | INTRAVENOUS | Status: DC
  Administered 2023-01-31: 2 mg/h via INTRAVENOUS

## 2023-01-31 MED ORDER — CHLORHEXIDINE GLUCONATE CLOTH 2 % EX PADS
6.0000 | MEDICATED_PAD | Freq: Every day | CUTANEOUS | Status: DC
  Administered 2023-01-31 – 2023-02-11 (×12): 6 via TOPICAL

## 2023-01-31 MED ORDER — ASPIRIN 300 MG RE SUPP
300.0000 mg | Freq: Once | RECTAL | Status: AC
  Administered 2023-01-31: 300 mg via RECTAL

## 2023-01-31 MED ORDER — SODIUM CHLORIDE 0.9 % IV SOLN
INTRAVENOUS | Status: DC | PRN
  Administered 2023-01-31: 2 ug/kg/min via INTRAVENOUS

## 2023-01-31 NOTE — ED Provider Notes (Signed)
[place] EMERGENCY DEPARTMENT AT [facility]  Provider Note  CSN: [id number] Arrival date & time: 01/31/23 0035  History Chief Complaint  Patient presents with   Code Stroke    Grant Berry is a 77 y.o. male with history of remote stroke brought to the ED via EMS as a Code Stroke from home. Per their report he fell at home around 1830hrs, hitting his head. Has had trouble walking and slurred speech since then. He has apparently fallen two additional times since then. He denies blood thinners. Reported having some dizziness the last few days.    Home Medications Prior to Admission medications   Medication Sig Start Date End Date Taking? Authorizing Provider  amLODipine (NORVASC) 10 MG tablet Take 1 tablet (10 mg total) by mouth daily. 07/10/22  Yes Gabriel Earing, FNP  B Complex-C (B-COMPLEX WITH VITAMIN C) tablet Take 1 tablet by mouth daily. 05/29/21  Yes Deliah Boston F, FNP  MAGNESIUM PO Take 1 tablet by mouth in the morning and at bedtime.   Yes [provider]  Omega-3 Fatty Acids (FISH OIL PO) Take 1 capsule by mouth daily.   Yes [provider]  ezetimibe (ZETIA) 10 MG tablet Take 10 mg by mouth daily. Patient not taking: Reported on 01/31/2023 12/15/22   [provider]     Allergies    Lisinopril, Atorvastatin, and Pravastatin   Review of Systems   Review of Systems Please see HPI for pertinent positives and negatives  Physical Exam BP (!) 155/90   Pulse 75   Temp 98.6 F (37 C) (Oral)   Resp 17   Ht 5\' 7"  (1.702 m)   Wt 62.1 kg Comment: weight from 07/2022  SpO2 98%   BMI 21.44 kg/m   Physical Exam Vitals and nursing note reviewed.  Constitutional:      Appearance: Normal appearance.  HENT:     Head: Normocephalic.     Comments: Abrasion R eyebrow with peri-orbital ecchymosis     Nose: Nose normal.     Mouth/Throat:     Mouth: Mucous membranes are moist.  Eyes:     Extraocular Movements: Extraocular  movements intact.     Conjunctiva/sclera: Conjunctivae normal.  Cardiovascular:     Rate and Rhythm: Normal rate.  Pulmonary:     Effort: Pulmonary effort is normal.     Breath sounds: Normal breath sounds.  Abdominal:     General: Abdomen is flat.     Palpations: Abdomen is soft.     Tenderness: There is no abdominal tenderness.  Musculoskeletal:        General: No swelling. Normal range of motion.     Cervical back: Neck supple.  Skin:    General: Skin is warm and dry.  Neurological:     Mental Status: He is alert and oriented to person, place, and time.     Comments: Mild dysarthria, moves all extremities equally, no facial asymmetry. For full NIHSS, please see Neuro notes.   Psychiatric:        Mood and Affect: Mood normal.     ED Results / Procedures / Treatments   EKG EKG Interpretation Date/Time:  Sunday January 31 2023 01:38:30 EST Ventricular Rate:  78 PR Interval:  186 QRS Duration:  115 QT Interval:  381 QTC Calculation: 434 R Axis:   56  Text Interpretation: Sinus rhythm Nonspecific intraventricular conduction delay Borderline low voltage, extremity leads Minimal ST depression, inferior leads No significant change  since last tracing Confirmed by Susy Frizzle [phone]) on 01/31/2023 1:43:32 AM  Procedures .Critical Care  Performed by: Pollyann Savoy, MD Authorized by: Pollyann Savoy, MD   Critical care provider statement:    Critical care time (minutes):  60   Critical care was necessary to treat or prevent imminent or life-threatening deterioration of the following conditions:  CNS failure or compromise   Critical care was time spent personally by me on the following activities:  Development of treatment plan with patient or surrogate, discussions with consultants, evaluation of patient's response to treatment, examination of patient, ordering and review of laboratory studies, ordering and review of radiographic studies, ordering and performing  treatments and interventions, pulse oximetry, re-evaluation of patient's condition and review of old charts   Care discussed with: admitting provider     Medications Ordered in the ED Medications   stroke: early stages of recovery book (has no administration in time range)  0.9 %  sodium chloride infusion (has no administration in time range)  acetaminophen (TYLENOL) tablet 650 mg (has no administration in time range)    Or  acetaminophen (TYLENOL) 160 MG/5ML solution 650 mg (has no administration in time range)    Or  acetaminophen (TYLENOL) suppository 650 mg (has no administration in time range)  senna-docusate (Senokot-S) tablet 1 tablet (has no administration in time range)  enoxaparin (LOVENOX) injection 40 mg (has no administration in time range)  aspirin EC tablet 81 mg (has no administration in time range)  clopidogrel (PLAVIX) tablet 300 mg (has no administration in time range)  clopidogrel (PLAVIX) tablet 75 mg (has no administration in time range)  iohexol (OMNIPAQUE) 350 MG/ML injection 75 mL (75 mLs Intravenous Contrast Given 01/31/23 0055)  aspirin suppository 300 mg (300 mg Rectal Given 01/31/23 0205)    Initial Impression and Plan  Patient arrives as a Code Stroke after a fall about 6 hours PTA. He is not a candidate for TNK, but may have LVO. Teleneuro consulted. Stroke orders initiated. CT/CTA is pending.   ED Course   Clinical Course as of 01/31/23 0434  [facility] Jan 31, 2023  3664 CBC unremarkable. I-stat with mildly elevated Cr.  [CS]  0109 Spoke with Dr. Chase Picket, Radiologist, no acute finding on CT non-con. Coags and EtOH are normal. UA and UDS are clear.  [CS]  0121 Spoke again with Dr. 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 Teleneurology is evaluating the patient now.  [CS]  0137 Spoke with Dr. Leslie Dales, teleneurology, who requests the patient be sent for emergent MRI to [facility]. He will also coordinate with Neuro IR for possible LVO  intervention.He requests ASA and Plavix loading as well. Dr. Bebe Shaggy aware.  [CS]  0139 CMP with mildly elevated Cr.  [CS]    Clinical Course User Index [CS] Pollyann Savoy, MD     MDM Rules/Calculators/A&P Medical Decision Making Problems Addressed: Acute stroke due to ischemia [facility]): acute illness or injury  Amount and/or Complexity of Data Reviewed Labs: ordered. Decision-making details documented in ED Course. Radiology: ordered and independent interpretation performed. Decision-making details documented in ED Course. ECG/medicine tests: ordered and independent interpretation performed. Decision-making details documented in ED Course.  Risk OTC drugs. Prescription drug management. Decision regarding hospitalization.     Final Clinical Impression(s) / ED Diagnoses Final diagnoses:  Acute stroke due to ischemia [facility])    Rx / DC Orders ED Discharge Orders     None  Pollyann Savoy, MD 01/31/23 [phone]

## 2023-01-31 NOTE — Transfer of Care (Signed)
Immediate Anesthesia Transfer of Care Note  Patient: Grant Berry  Procedure(s) Performed: IR WITH ANESTHESIA  Patient Location: PACU  Anesthesia Type:General  Level of Consciousness: awake and alert   Airway & Oxygen Therapy: Patient Spontanous Breathing and Patient connected to face mask oxygen  Post-op Assessment: Report given to RN and Post -op Vital signs reviewed and stable  Post vital signs: Reviewed and stable  Last Vitals:  Vitals Value Taken Time  BP 118/76 01/31/23 1143  Temp    Pulse 62 01/31/23 1151  Resp 11 01/31/23 1151  SpO2 100 % 01/31/23 1151  Vitals shown include unfiled device data.  Last Pain:  Vitals:   01/31/23 0600  TempSrc:   PainSc: Asleep         Complications: There were no known notable events for this encounter.

## 2023-01-31 NOTE — Progress Notes (Addendum)
STROKE TEAM PROGRESS NOTE   SUBJECTIVE (INTERVAL HISTORY) No family is at the bedside.  Patient RN is at bedside.  Patient admitted early this morning for multiple falls at home.  MRI showed left MCA/PCA infarct, chronic right PCA infarct.  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  Admitted to ICU for close monitoring.  This morning on shift change, patient was found to have intermittent expressive aphasia, which was new.  Stat CT showed no bleeding.  CT perfusion showed no infarct core and increased TTP on posterior circulation and left MCA territory.  Discussed with Dr. Corliss Skains, code IR activated.  Emergent consent obtained from 2 physicians.  Patient had left terminal ICA stenting and proximal ICA angioplasty.   OBJECTIVE Temp:  [98.3 F (36.8 C)-98.6 F (37 C)] 98.3 F (36.8 C) (12/08 0548) Pulse Rate:  [57-90] 72 (12/08 1415) Cardiac Rhythm: Normal sinus rhythm (12/08 0530) Resp:  [11-34] 15 (12/08 1415) BP: (108-162)/(58-93) 111/69 (12/08 1415) SpO2:  [88 %-100 %] 100 % (12/08 1415) Arterial Line BP: (124-152)/(49-63) 137/52 (12/08 1415) Weight:  [54.1 kg-62.1 kg] 54.1 kg (12/08 0530)  No results for input(s): "GLUCAP" in the last 168 hours. Recent Labs  Lab 01/31/23 0040 01/31/23 0042  NA 139 139  K 3.5 3.6  CL 104 102  CO2 24  --   GLUCOSE 102* 102*  BUN 27* 27*  CREATININE 1.33* 1.40*  CALCIUM 9.7  --    Recent Labs  Lab 01/31/23 0040  AST 27  ALT 15  ALKPHOS 55  BILITOT 0.5  PROT 7.2  ALBUMIN 4.0   Recent Labs  Lab 01/31/23 0040 01/31/23 0042 01/31/23 0454  WBC 6.7  --  6.4  NEUTROABS 4.5  --   --   HGB 12.5* 12.9* 12.9*  HCT 37.6* 38.0* 38.9*  MCV 90.6  --  89.6  PLT 192  --  196   No results for input(s): "CKTOTAL", "CKMB", "CKMBINDEX", "TROPONINI" in the last 168 hours. Recent Labs    01/31/23 0040  LABPROT 13.6  INR 1.0   Recent Labs     01/31/23 0039  COLORURINE AMBER*  LABSPEC 1.017  PHURINE 5.0  GLUCOSEU NEGATIVE  HGBUR NEGATIVE  BILIRUBINUR NEGATIVE  KETONESUR 5*  PROTEINUR NEGATIVE  NITRITE NEGATIVE  LEUKOCYTESUR NEGATIVE       Component Value Date/Time   CHOL 235 (H) 01/31/2023 0454   CHOL 202 (H) 07/10/2022 1012   CHOL 219 (H) 06/17/2012 1620   TRIG 47 01/31/2023 0454   TRIG 215 (H) 06/17/2012 1620   HDL 59 01/31/2023 0454   HDL 48 07/10/2022 1012   HDL 34 (L) 06/17/2012 1620   CHOLHDL 4.0 01/31/2023 0454   VLDL 9 01/31/2023 0454   LDLCALC 167 (H) 01/31/2023 0454   LDLCALC 134 (H) 07/10/2022 1012   LDLCALC 142 (H) 06/17/2012 1620   Lab Results  Component Value Date   HGBA1C 5.1 01/31/2023      Component Value Date/Time   LABOPIA NONE DETECTED 01/31/2023 0039   COCAINSCRNUR NONE DETECTED 01/31/2023 0039   LABBENZ NONE DETECTED 01/31/2023 0039   AMPHETMU NONE DETECTED 01/31/2023 0039   THCU NONE DETECTED 01/31/2023 0039   LABBARB NONE DETECTED 01/31/2023 0039    Recent Labs  Lab 01/31/23 0040  ETH <10    I have personally reviewed the radiological images below and agree with the radiology interpretations.  CT CEREBRAL PERFUSION W CONTRAST  Result  Date: 01/31/2023 CLINICAL DATA:  Code stroke. 77 year old male. CTA yesterday demonstrating left ICA terminus, distal left vertebral artery, Basilar artery occlusions. EXAM: CT PERFUSION BRAIN TECHNIQUE: Multiphase CT imaging of the brain was performed following IV bolus contrast injection. Subsequent parametric perfusion maps were calculated using RAPID software. RADIATION DOSE REDUCTION: This exam was performed according to the departmental dose-optimization program which includes automated exposure control, adjustment of the mA and/or kV according to patient size and/or use of iterative reconstruction technique. CONTRAST:  40mL OMNIPAQUE IOHEXOL 350 MG/ML SOLN COMPARISON:  Head CTs, CTA head and neck, and brain MRI earlier today. FINDINGS: CT Brain  Perfusion Findings: CBF (<30%) Volume: 0mL. No CBF or CBV parameter abnormality is detected. Perfusion (Tmax>6.0s) volume: . Fairly symmetric abnormal T-max throughout the bilateral cerebellum, posterior cerebral hemispheres. T-max > 4 seconds is more pronounced in the left cerebral hemisphere including the left MCA territory. There is volume of minimal T-max > 10 seconds in the right posterior circulation. Mismatch Volume: Infarct Core: No infarct core is detected Infarction Location:Oligemia throughout the posterior circulation, to a lesser extent the left MCA territory. IMPRESSION: Oligemia throughout the bilateral posterior circulation, including the posterior fossa. Less pronounced oligemia in the Left MCA territory. No infarct core, CBF or CBV parameter abnormality detected by CTP. These results were communicated to Dr. Roda Shutters at 8:12 am on 01/31/2023 by text page via the [facility] messaging system. Electronically Signed   By: Odessa Fleming M.D.   On: 01/31/2023 08:12   CT HEAD CODE STROKE WO CONTRAST  Result Date: 01/31/2023 CLINICAL DATA:  Code stroke. 77 year old male. CTA yesterday demonstrating left ICA terminus, distal left vertebral artery, Basilar artery occlusions. EXAM: CT HEAD WITHOUT CONTRAST TECHNIQUE: Contiguous axial images were obtained from the base of the skull through the vertex without intravenous contrast. RADIATION DOSE REDUCTION: This exam was performed according to the departmental dose-optimization program which includes automated exposure control, adjustment of the mA and/or kV according to patient size and/or use of iterative reconstruction technique. COMPARISON:  Brain MRI 0244 hours today. CTA head and neck 0056 hours today. Plain head CT 0050 hours today. FINDINGS: Brain: Left occipital pole cytotoxic edema is slightly more conspicuous on CT now. Heterogeneous right occipital pole redemonstrated. Chronic left central pontine and bilateral cerebellar infarcts on MRI appears stable  by CT. No acute intracranial hemorrhage identified. No new cytotoxic edema compared to the earlier MRI. No midline shift, mass effect, or evidence of intracranial mass lesion. No ventriculomegaly. Vascular: Extensive Calcified atherosclerosis at the skull base. See also CTA earlier today. Skull: Stable and intact. Sinuses/Orbits: Visualized paranasal sinuses and mastoids are clear. Other: No acute orbit or scalp soft tissue finding. ASPECTS [facility] Stroke Program Early CT Score) Total score (0-10 with 10 being normal): 10; occipital pole cytotoxic edema. IMPRESSION: 1. Stable non contrast CT appearance of the brain since 0050 hours today: Occipital pole Cytotoxic edema stable from the MRI at 0242 hours today. No new edema, no intracranial hemorrhage, or mass effect. 2. 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 Electronically Signed   By: Odessa Fleming M.D.   On: 01/31/2023 08:08   MR BRAIN WO CONTRAST  Result Date: 01/31/2023 CLINICAL DATA:  Dizziness and gait instability. Large vessel occlusion. EXAM: MRI HEAD WITHOUT CONTRAST TECHNIQUE: Multiplanar, multiecho pulse sequences of the brain and surrounding structures were obtained without intravenous contrast. COMPARISON:  CTA head neck 01/31/2023 Brain MRI 09/01/2019 FINDINGS: Brain: There are areas of acute/early subacute ischemia within both occipital lobes,  left-greater-than-right. These areas correspond to the areas of infarction seen on the earlier noncontrast head CT (suggested to be chronic at that time). There is no acute ischemia of the brainstem, cerebellum or anterior circulation. There is multifocal hyperintense T2-weighted signal within the white matter. Generalized volume loss. There is hyperintense T2-weighted signal that corresponds to both sites of infarction. There is a component of chronic infarct within the superior right occipital lobe (series 11, image 25). There are multiple old cerebellar infarcts. The midline structures are  normal. Vascular: Abnormal distal basilar artery flow void in keeping with known occlusion. Abnormal flow void at the skull base left ICA covers a longer segment than the demonstrated occlusion on the earlier CTA, likely due to inhibited upstream flow. Skull and upper cervical spine: Normal calvarium and skull base. Visualized upper cervical spine and soft tissues are normal. Sinuses/Orbits:No paranasal sinus fluid levels or advanced mucosal thickening. No mastoid or middle ear effusion. Normal orbits. IMPRESSION: 1. Acute/early subacute infarcts within both occipital lobes, left-greater-than-right. 2. No brainstem, cerebellar or anterior circulation acute ischemia. 3. Abnormal skull base flow voids in keeping with known vascular and left ICA occlusions. 4. Old superior right occipital and bilateral cerebellar infarcts. Electronically Signed   By: Deatra Robinson M.D.   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 There is reconstitution of the basilar tip with patency of both PCAs maintained. The superior cerebellar arteries are occluded at their origin. Please note that this addendum was previously mistakenly associated with the noncontrast head CT for this patient performed on the same day. Electronically Signed   By: Deatra Robinson M.D.   On: 01/31/2023 02:45   Result Date: 01/31/2023 CLINICAL DATA:  Fall with dysarthria and dizziness EXAM: CT ANGIOGRAPHY HEAD AND NECK WITH AND WITHOUT CONTRAST TECHNIQUE: Multidetector CT imaging of the head and neck was performed using the standard protocol during bolus administration of intravenous contrast. Multiplanar CT image reconstructions and MIPs were obtained to evaluate the vascular anatomy. Carotid stenosis measurements (when applicable) are obtained utilizing NASCET criteria, using the  distal internal carotid diameter as the denominator. RADIATION DOSE REDUCTION: This exam was performed according to the departmental dose-optimization program which includes automated exposure control, adjustment of the mA and/or kV according to patient size and/or use of iterative reconstruction technique. CONTRAST:  75mL OMNIPAQUE IOHEXOL 350 MG/ML SOLN COMPARISON:  None Available. FINDINGS: CTA NECK FINDINGS SKELETON: No acute abnormality or high grade bony spinal canal stenosis. OTHER NECK: Normal pharynx, larynx and major salivary glands. No cervical lymphadenopathy. Unremarkable thyroid gland. UPPER CHEST: No pneumothorax or pleural effusion. No nodules or masses. AORTIC ARCH: There is calcific atherosclerosis of the aortic arch. Conventional 3 vessel aortic branching pattern. RIGHT CAROTID SYSTEM: No dissection, occlusion or aneurysm. There is mixed density atherosclerosis extending into the proximal ICA, resulting in less than 50% stenosis. LEFT CAROTID SYSTEM: No dissection, occlusion or aneurysm. There is mixed density atherosclerosis extending into the proximal ICA, resulting in 60% stenosis. VERTEBRAL ARTERIES: Right dominant configuration. Diminutive left vertebral artery with probable short segment occlusion of the V3 segment. Normal right vertebral artery to the skull base. CTA HEAD FINDINGS POSTERIOR CIRCULATION: Right vertebral artery atherosclerosis with moderate stenosis. Normal left V4 segment. No proximal occlusion of the anterior or inferior cerebellar arteries. Basilar artery is normal. Superior cerebellar arteries  are normal. Posterior cerebral arteries are normal. ANTERIOR CIRCULATION: The left ICA is occluded at its terminus within occluded length of approximately 9 mm. The right ICA is mildly atherosclerotic without stenosis. Anterior cerebral arteries are normal. Moderate multifocal stenosis of the M1 segment of the right MCA. The left MCA is patent, likely filled by collateral flow along  the circle-of-Willis. There is multifocal mild atherosclerotic irregularity without high-grade stenosis. VENOUS SINUSES: As permitted by contrast timing, patent. ANATOMIC VARIANTS: None Review of the MIP images confirms the above findings. IMPRESSION: 1. 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 The left MCA is patent, likely filled by collateral flow along the circle-of-Willis. 2. Moderate multifocal stenosis of the M1 segment of the right MCA. 3. Moderate right vertebral artery stenosis. 4. Bilateral carotid bifurcation atherosclerosis with 60% stenosis of the proximal left ICA. 5. Diminutive left vertebral artery with probable short segment occlusion of the V3 segment. Aortic Atherosclerosis (ICD10-I70.0). Critical Value/emergent results were called by telephone at the time of interpretation on 01/31/2023 at 1:17 am to provider [facility] , who verbally acknowledged these results. Electronically Signed: By: Deatra Robinson M.D. 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 There is reconstitution of the basilar tip with patency of both PCAs maintained. The superior cerebellar arteries are occluded at their origin. Electronically Signed   By: Deatra Robinson M.D.   On: 01/31/2023 01:30   Result Date: 01/31/2023 CLINICAL DATA:  Code stroke.  Fall with dizziness EXAM: CT HEAD WITHOUT CONTRAST TECHNIQUE: Contiguous axial images were obtained from the base of the skull through the vertex without intravenous contrast. RADIATION DOSE REDUCTION: This exam was performed according to the departmental dose-optimization program which includes automated exposure control, adjustment of the mA and/or kV according to patient size and/or use of iterative reconstruction technique.  COMPARISON:  None Available. FINDINGS: Brain: There is no mass, hemorrhage or extra-axial collection. The size and configuration of the ventricles and extra-axial CSF spaces are normal. There are multiple old cerebellar small vessel infarcts. Bilateral PCA territory infarctions also appear chronic. There is mild white matter hypoattenuation. Vascular: Atherosclerosis of the carotid and vertebral arteries at the skull base. Skull: Normal Sinuses/Orbits: No fluid levels or advanced mucosal thickening of the visualized paranasal sinuses. No mastoid or middle ear effusion. The orbits are normal. ASPECTS [facility] Stroke Program Early CT Score) - Ganglionic level infarction (caudate, lentiform nuclei, internal capsule, insula, M1-M3 cortex): 7 - Supraganglionic infarction (M4-M6 cortex): 3 Total score (0-10 with 10 being normal): 10 IMPRESSION: 1. No acute intracranial abnormality. 2. ASPECTS is 10. 3. 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 4. Bilateral PCA territory infarctions also appear chronic. These results were called by telephone at the time of interpretation on 01/31/2023 at 1:00 am to provider [facility] , who verbally acknowledged these results. Electronically Signed: By: Deatra Robinson M.D. On: 01/31/2023 01:00   CT C-SPINE NO CHARGE  Result Date: 01/31/2023 CLINICAL DATA:  Neck pain, known left ICA occlusion EXAM: CT CERVICAL SPINE WITHOUT CONTRAST TECHNIQUE: Multidetector CT imaging of the cervical spine was performed without intravenous contrast. Multiplanar CT image reconstructions were also generated. RADIATION DOSE REDUCTION: This exam was performed according to the departmental dose-optimization program which includes automated exposure control, adjustment of the mA and/or kV according  to patient size and/or use of iterative reconstruction technique. COMPARISON:  None Available. FINDINGS: Alignment: Within normal limits. Skull base and vertebrae: 7 cervical segments are well visualized.  Vertebral body height is well maintained. Mild osteophytic change and facet hypertrophic changes noted. No acute fracture or acute facet abnormality is noted. The odontoid is within normal limits. Soft tissues and spinal canal: Surrounding soft tissue structures appear within normal limits. Upper chest: Visualized lung apices are unremarkable with the exception of some scarring in the right apex. Other: None IMPRESSION: Multilevel degenerative change without acute bony abnormality. Electronically Signed   By: Alcide Clever M.D.   On: 01/31/2023 01:25     PHYSICAL EXAM  Temp:  [98.3 F (36.8 C)-98.6 F (37 C)] 98.3 F (36.8 C) (12/08 0548) Pulse Rate:  [57-90] 72 (12/08 1415) Resp:  [11-34] 15 (12/08 1415) BP: (108-162)/(58-93) 111/69 (12/08 1415) SpO2:  [88 %-100 %] 100 % (12/08 1415) Arterial Line BP: (124-152)/(49-63) 137/52 (12/08 1415) Weight:  [54.1 kg-62.1 kg] 54.1 kg (12/08 0530)  General - Well nourished, well developed, feeling cold with shivering.  Ophthalmologic - fundi not visualized due to noncooperation.  Cardiovascular - Regular rhythm and rate..  Neuro - Patient lying in bed, shivering due to coldness. On [employer]. He is awake, alert, eyes open, orientated to [place], place, did not answer time.  Limited language output, however able to say " I need to pee" "I am trying but I can't" "I don't know", following all simple commands. Able to name 2/3 but not cooperative with repeating. No gaze palsy, tracking bilaterally, visual field full, PERRL. No facial droop. Tongue midline. Bilateral UEs 4/5, no drift. Bilaterally LEs proximally not able to check due to postprocedure, distally 5/5. Sensation symmetrical bilaterally, coordination not corporative, gait not tested.    ASSESSMENT/PLAN [name] is a 77 y.o. male with history of hypertension, hyperlipidemia, stroke admitted for multiple falls. No tPA given due to outside window.    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  Left ICA proximal 60% stenosis, right multifocal M1 moderate stenosis, right VA stenosis. MRI left MCA/PCA small to moderate infarct.  Old right PCA infarct. Due to developing expressive aphasia, repeat CT showed no acute change CTP no core infarct, increased TTP at posterior circulation and left MCA territory Status post IR with terminal ICA stenting and proximal ICA angioplasty with TICI3 reperfusion Post IR CT repeat pending MRI repeat pending 2D Echo pending LDL 167 HgbA1c 5.1 UDS negative Lovenox for VTE prophylaxis No antithrombotic prior to admission, now on aspirin 81 mg daily and clopidogrel 75 mg daily after Plavix loading Ongoing aggressive stroke risk factor management Therapy recommendations: Pending Disposition: Pending  Bilateral occlusion Likely chronic given no core infarct on CT perfusion and no posterior circulation infarct on MRI. Bilateral P-comm and PCA patent Avoid low BP No intervention needed at this time, discussed with Dr. Corliss Skains  History of stroke 08/2019 admitted for dizziness, imbalance and leaning towards to the left.  CT showed left cerebellar infarct which confirmed on MRI.  MRI showed left SCA occlusion, left VA hypoplastic.  2D echo unremarkable.  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  Discharged on DAPT and Lipitor. Follow-up with Dr. Pearlean Brownie at [facility]  Hypertension Stable on the low end Avoid  low BP Long term BP goal normotensive  Hyperlipidemia Home meds: Zetia LDL 167, goal < 70 Statin intolerance (Lipitor and pravastatin) Continue Zetia at discharge Will need to consider Leqvio  Other Stroke Risk Factors Advanced Berry Former smoker  Other Active Problems AKI, creatinine 1.40  Hospital day # 0  This patient is  critically ill due to left ICA occlusion, left MCA stroke, status post intracranial stenting and at significant risk of neurological worsening, death form recurrent stroke, hemorrhagic transformation. This patient's care requires constant monitoring of vital signs, hemodynamics, respiratory and cardiac monitoring, review of multiple databases, neurological assessment, discussion with family, other specialists and medical decision making of high complexity. I spent 30 minutes of neurocritical care time in the care of this patient.  I discussed with Dr. Corliss Skains.  Marvel Plan, MD PhD Stroke Neurology 01/31/2023 5:27 PM    To contact Stroke Continuity provider, please refer to [email or web address]. After hours, contact General Neurology

## 2023-01-31 NOTE — Anesthesia Procedure Notes (Signed)
Arterial Line Insertion Start/End12/09/2022 8:47 AM Performed by: Dairl Ponder, CRNA  Patient location: Pre-op. Preanesthetic checklist: patient identified, IV checked, site marked, risks and benefits discussed, surgical consent, monitors and equipment checked, pre-op evaluation, timeout performed and anesthesia consent Lidocaine 1% used for infiltration Left, radial was placed Catheter size: 20 G Hand hygiene performed  and maximum sterile barriers used   Attempts: 1 Procedure performed without using ultrasound guided technique. Following insertion, dressing applied. Post procedure assessment: normal and unchanged

## 2023-01-31 NOTE — Anesthesia Preprocedure Evaluation (Signed)
Anesthesia Evaluation  Patient identified by MRN, date of birth, ID band Patient confused    Reviewed: Allergy & Precautions, NPO status , Patient's Chart, lab work & pertinent test results, Unable to perform ROS - Chart review onlyPreop documentation limited or incomplete due to emergent nature of procedure.  Airway Mallampati: II  TM Distance: >3 FB Neck ROM: Full    Dental  (+) Teeth Intact, Dental Advisory Given   Pulmonary former smoker   Pulmonary exam normal breath sounds clear to auscultation       Cardiovascular hypertension, Pt. on medications Normal cardiovascular exam Rhythm:Regular Rate:Normal     Neuro/Psych CVA    GI/Hepatic negative GI ROS, Neg liver ROS,,,  Endo/Other  negative endocrine ROS    Renal/GU Renal disease (AKI)     Musculoskeletal negative musculoskeletal ROS (+)    Abdominal   Peds  Hematology  (+) Blood dyscrasia, anemia   Anesthesia Other Findings Day of surgery medications reviewed with the patient.  Reproductive/Obstetrics                             Anesthesia Physical Anesthesia Plan  ASA: 3 and emergent  Anesthesia Plan: General   Post-op Pain Management:    Induction: Intravenous  PONV Risk Score and Plan: 2 and Dexamethasone and Ondansetron  Airway Management Planned: Oral ETT  Additional Equipment: Arterial line  Intra-op Plan:   Post-operative Plan: Possible Post-op intubation/ventilation  Informed Consent: I have reviewed the patients History and Physical, chart, labs and discussed the procedure including the risks, benefits and alternatives for the proposed anesthesia with the patient or authorized representative who has indicated his/her understanding and acceptance.     Only emergency history available and History available from chart only  Plan Discussed with: CRNA  Anesthesia Plan Comments:        Anesthesia Quick  Evaluation

## 2023-01-31 NOTE — Consult Note (Signed)
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 LKW unclear, but states symptoms started after he woke up this morning. He experienced symptoms of dizziness and gait instability leading to falls including soft tissue injury to his head. NIHSS = 6 for baseline left sided ataxia, dysarthria, and bilateral vision loss. He tells me his vision has been very poor for a year and he thinks he had a stroke affecting his vision a year ago but denies seeking care for this. He is currently OOW for TPA. NCCT Head with chronic cerebellar infarcts noted as well as subacute to chronic RPCA territory infarct and more subacute appearing LPCA territory infarct. CTA with left distal ICA occlusion as well as mid-distal basilar occlusion. Unclear chronicity of this given history and exam. I discussed case with on-call neurology at [facility] and agreed (along with NIR) to get STAT MRI and decide on potential for intervention from there.    Recommenations:  -q1 vitals/neurochecks  -SBP goal normotension, avoid sudden drops in BP >10% over 24 hours  -BG goal 180mg /dL  -Aspirin 325mg  x1 and Plavix 300mg  x1 in ED  -Atorvastatin 80mg  qHS  -Check LDL and A1c  -Obtain STAT MRI Brain w/o contrast  -Obtain TTE and place on telemetry monitoring  -PT/OT/ST consults, NPO until passes speech/swallow eval  -Stroke education  -Neurology follow up recommended  Discussed with NIR Text: Discussed with on call neurology for [place] as well as on call NIR. Agreed to go for STAT MRI and deciding for intervention based on those results  given uncertain chronicity of the occlusion.    ------------------------------------------------------------------------------  Advanced Imaging: CTA Head and Neck Completed.  LVO:Yes  Discussed with NIR :Yes  Initial Call Time To NIR : 01/31/2023 02:00:18  NIR Accept/Decision Time : 01/31/2023 02:00:19  Discussed with NIR Time : 01/31/2023 02:00:19  Discussed with NIR Text : Discussed with on call neurology for [place] as well as on call NIR. Agreed to go for STAT MRI and deciding for intervention based on those results given uncertain chronicity of the occlusion.  Neurointerventionalist Accepted Case : Patient is not a candidate for intervention.   Metrics: Last Known Well: Unknown Dispatch Time: 01/31/2023 00:47:27 Arrival Time: 01/31/2023 00:35:00 Initial Response Time: 01/31/2023 00:48:00 Symptoms: Falls. Initial patient interaction: 01/31/2023 01:00:46 NIHSS Assessment Completed: 01/31/2023 01:08:35 Patient is not a candidate for Thrombolytic. Thrombolytic Medical Decision: 01/31/2023 01:08:36 Patient was not deemed candidate for Thrombolytic because of following reasons: LKW outside 4.5 hr window. .  I personally Reviewed the CT Head and it Showed an acute to subacute right PCA stroke with ?petechial hemorrhage in right occipital lobe, prior cerebellar infarcts noted  Primary Provider Notified of Diagnostic Impression and Management Plan on: 01/31/2023 01:39:20    ------------------------------------------------------------------------------  History of Present Illness: Patient is a 77 year old Male.  Patient was brought by EMS for symptoms of Falls. Grant Berry is a 77 year old man with HTN, HLD, and history of stroke who presents to ED for evaluation of multiple falls today. He tells me he woke up  in his usual state of health this morning, but sometime later in the morning felt extremely dizzy and then fell. He endorses not returning to baseline in  between falls and suffered two additional falls throughout the day ultimately prompting presentation. He denies any new vision issues, but tells me his vision has been very poor for about a year. He endorses thinking he had a stroke affecting his vision a year ago but did not seek care for this. He does not take any blood thinners or antiplatelets    Past Medical History:      Hypertension      Hyperlipidemia  Medications:  No Anticoagulant use  No Antiplatelet use Reviewed EMR for current medications  Allergies:  Reviewed  Social History: Drug Use: No  Family History:  There is no family history of premature cerebrovascular disease pertinent to this consultation  ROS : 14 Points Review of Systems was performed and was negative except mentioned in HPI.  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 + 3 4: Test Facial Palsy (Use Grimace if Obtunded) - Normal symmetry + 0 5A: Test Left Arm Motor Drift - No Drift for 10 Seconds + 0 5B: Test Right Arm Motor Drift - No Drift for 10 Seconds + 0 6A: Test Left Leg Motor Drift - No Drift for 5 Seconds + 0 6B: Test Right Leg Motor Drift - No Drift for 5 Seconds + 0 7: Test Limb Ataxia (FNF/Heel-Shin) - Ataxia in 2 Limbs + 2 8: Test Sensation - Normal; No sensory loss + 0 9: Test Language/Aphasia - Normal; No aphasia + 0 10: Test Dysarthria - Mild-Moderate Dysarthria: Slurring but can be understood + 1 11: Test Extinction/Inattention - No abnormality + 0  NIHSS Score: 6  NIHSS Free Text : Baseline left arm and leg ataxia. Dysarthria (worse than normal). Significantly  decreased visual acuity bilaterally  Pre-Morbid Modified Rankin Scale: 3 Points = Moderate disability; requiring some help, but able to walk without assistance  Spoke with : Dr. Bernette Mayers  This consult was conducted in real time using interactive audio and Immunologist. Patient was informed of the technology being used for this visit and agreed to proceed. Patient located in hospital and provider located at home/office setting.   Patient is being evaluated for possible acute neurologic impairment and high probability of imminent or life-threatening deterioration. I spent total of 45 minutes providing care to this patient, including time for face to face visit via telemedicine, review of medical records, imaging studies and discussion of findings with providers, the patient and/or family.   Dr Horton Chin   [facility] For Inpatient follow-up with [facility] physician please call [facility] at [phone]. As we are not an outpatient service for any post hospital discharge needs please contact the hospital for assistance. If you have any questions for the [facility] physicians or need to reconsult for clinical or diagnostic changes please contact us via [facility] at [phone].

## 2023-01-31 NOTE — ED Notes (Signed)
CBG 93 

## 2023-01-31 NOTE — Progress Notes (Signed)
SLP Cancellation Note  Patient Details Name: ZYHEEM TRAYWICK MRN: [id number] DOB: Nov 12, 1945   Cancelled treatment:       Reason Eval/Treat Not Completed: Patient at procedure or test/unavailable  ST will continue efforts to complete communication/cognitive evaluation.  Dimas Aguas, MA, CCC-SLP Acute Rehab SLP [phone]  Fleet Contras 01/31/2023, 8:28 AM

## 2023-01-31 NOTE — ED Provider Notes (Signed)
Patient arrives from [facility] for concern for CVA and large vessel occlusion.  Plan per neurology Dr. Derry Lory is for emergent MRI.  If MRI is negative, patient can then be admitted.  We will not activate code IR at this time   Zadie Rhine, MD 01/31/23 [phone]

## 2023-01-31 NOTE — Anesthesia Postprocedure Evaluation (Signed)
Anesthesia Post Note  Patient: Grant Berry  Procedure(s) Performed: IR WITH ANESTHESIA     Patient location during evaluation: ICU Anesthesia Type: General Level of consciousness: awake and alert Pain management: pain level controlled Vital Signs Assessment: post-procedure vital signs reviewed and stable Respiratory status: spontaneous breathing, nonlabored ventilation, respiratory function stable and patient connected to nasal cannula oxygen Cardiovascular status: blood pressure returned to baseline and stable Postop Assessment: no apparent nausea or vomiting Anesthetic complications: no   There were no known notable events for this encounter.  Last Vitals:  Vitals:   01/31/23 1145 01/31/23 1200  BP: 118/76 131/76  Pulse: 60 66  Resp: 15 19  Temp:    SpO2: 100% 100%    Last Pain:  Vitals:   01/31/23 0600  TempSrc:   PainSc: Asleep                 Collene Schlichter

## 2023-01-31 NOTE — Consult Note (Addendum)
NEUROLOGY CONSULT NOTE   Date of service: January 31, 2023 Patient Name: Grant Berry MRN:  [id number] DOB:  1945/10/29 Chief Complaint: "Basilar occlusion" Requesting Provider: Zadie Rhine, MD  History of Present Illness  Grant Berry is a 77 y.o. male with history of hypertension, hyperlipidemia, prior history of left MCA stroke who presented to the ED with multiple falls.  He initially presented to [facility], ED where he was evaluated by teleneurology as a code stroke.  He reported that he got up in the morning at some point it was at his baseline.  A bit after getting up in the morning, he feels off balance, fell 3 times and hit his head.  Reports that he bumped into objects and that is why he fell. He had workup with CT head without contrast with noted prior left cerebellar infarct along with a chronic right PCA infarct.  He was also noted to have a subacute appearing left PCA infarct.  He had further evaluation with CT angio head and neck with and without contrast which demonstrated a left ICA distal terminal occlusion along with occlusion of the tip of the basilar.  I was told [facility] did not have the ability to do CT perfusion and so the patient was transferred emergently here for an MRI brain to evaluate if he is a candidate for any intervention.  He had MRI brain without contrast which demonstrated an acute left PCA infarct noted on the diffusion imaging but he also has matched T2 flair hyperintensity in the same region.  No other acute posterior circulation findings.  Given unknown chronicity of the basilar occlusion/thrombosis, we will plan to admit him to our service for 24 hours to monitor his NIH stroke scale.   LKW: unclear Modified rankin score: 3-Moderate disability-requires help but walks WITHOUT assistance IV Thrombolysis: Not offered he was outside window.   EVT: Not offered despite noted basilar occlusion as he has matched deficit on MRI an unclear  chronicity of the occlusion  NIHSS components Score: Comment  1a Level of Conscious 0[x]  1[]  2[]  3[]      1b LOC Questions 0[x]  1[]  2[]       1c LOC Commands 0[x]  1[]  2[]       2 Best Gaze 0[x]  1[]  2[]       3 Visual 0[]  1[]  2[]  3[x]      4 Facial Palsy 0[x]  1[]  2[]  3[]      5a Motor Arm - left 0[x]  1[]  2[]  3[]  4[]  UN[]    5b Motor Arm - Right 0[x]  1[]  2[]  3[]  4[]  UN[]    6a Motor Leg - Left 0[x]  1[]  2[]  3[]  4[]  UN[]    6b Motor Leg - Right 0[x]  1[]  2[]  3[]  4[]  UN[]    7 Limb Ataxia 0[]  1[]  2[x]  3[]  UN[]     8 Sensory 0[x]  1[]  2[]  UN[]      9 Best Language 0[x]  1[]  2[]  3[]      10 Dysarthria 0[x]  1[]  2[]  UN[]      11 Extinct. 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 His smoking use included cigarettes. He has never used smokeless tobacco. He reports that he does not drink alcohol and does not use drugs.  Allergies  Allergen Reactions   Lisinopril     Dizziness   Atorvastatin Other (See Comments)    "Feels like crap", muscle pain, & dizziness.   Pravastatin Other (See Comments)    Pain    Medications   Current Facility-Administered Medications:    clopidogrel (PLAVIX) tablet 300 mg, 300 mg, Oral, Once, Pollyann Savoy, MD  Current Outpatient Medications:    amLODipine (NORVASC) 10 MG tablet, Take 1 tablet (10 mg total) by mouth daily., Disp: 90 tablet, Rfl: 3   B Complex-C (B-COMPLEX  WITH VITAMIN C) tablet, Take 1 tablet by mouth daily., Disp: 90 tablet, Rfl: 1   MAGNESIUM PO, Take 1 tablet by mouth in the morning and at bedtime., Disp: , Rfl:    Omega-3 Fatty Acids (FISH OIL PO), Take 1 capsule by mouth daily., Disp: , Rfl:    ezetimibe (ZETIA) 10 MG tablet, Take 10 mg by mouth daily. (Patient not taking: Reported on 01/31/2023), Disp: , Rfl:   Vitals   Vitals:   01/31/23 0134 01/31/23 0138  BP:  (!) 160/72  Pulse:  75  Resp:  14  Temp:  98.4 F (36.9 C)  TempSrc:  Oral  SpO2:  95%  Height: 5\' 7"  (1.702 m)     Body mass index is 20.85 kg/m.  Physical Exam   General: Laying comfortably in bed; in no acute distress.  HENT: Normal oropharynx and mucosa. Normal external appearance of ears and nose.  Neck: Supple, no pain or tenderness  CV: No JVD. No peripheral edema.  Pulmonary: Symmetric Chest rise. Normal respiratory effort.  Abdomen: Soft to touch, non-tender.  Ext: No cyanosis, edema, or deformity  Skin: No rash. Normal palpation of skin.   Musculoskeletal: Normal digits and nails by inspection. No clubbing.   Neurologic Examination  Mental status/Cognition: Alert, oriented to self, place, month and thinks this is 2084, good attention.  Speech/language: Fluent, comprehension intact, object naming intact, repetition intact.  Cranial nerves:   CN II Pupils equal and reactive to light, tunneled vision with inconsistent reponses to visual field testing and bilateral peripheral visual fields.   CN III,IV,VI EOM intact, no gaze preference or deviation, no nystagmus    CN V normal sensation in V1, V2, and V3 segments bilaterally    CN VII no asymmetry, no nasolabial fold flattening    CN VIII normal hearing to speech    CN IX & X normal palatal elevation, no uvular deviation    CN XI 5/5 head turn and 5/5 shoulder shrug bilaterally    CN XII midline tongue protrusion    Motor:  Muscle bulk: poor, tone normal, pronator drift none tremor: Noted ataxia in  left upper extremity left lower extremity.  This is chronic per patient. Mvmt Root Nerve  Muscle Right Left Comments  SA C5/6 Ax Deltoid 4+ 4+   EF C5/6 Mc Biceps 4+ 4+   EE C6/7/8 Rad Triceps 4+ 4+   WF C6/7 Med FCR     WE C7/8 PIN ECU     F Ab C8/T1 U ADM/FDI 4+ 4+  HF L1/2/3 Fem Illopsoas 4+ 4+   KE L2/3/4 Fem Quad 5 5   DF L4/5 D Peron Tib Ant 5 5   PF S1/2 Tibial Grc/Sol 5 5    Sensation:  Light touch Intact throughout   Pin prick    Temperature    Vibration   Proprioception    Coordination/Complex Motor:  - Finger to Nose notable for ataxia in left upper extremity. - Heel to shin notable for ataxia left lower extremity. - Rapid alternating movement are slowed on the left. - Gait: Deferred gait for patient safety. Labs/Imaging/Neurodiagnostic studies   CBC:  Recent Labs  Lab 2023-02-09 0040 02/09/23 0042  WBC 6.7  --   NEUTROABS 4.5  --   HGB 12.5* 12.9*  HCT 37.6* 38.0*  MCV 90.6  --   PLT 192  --    Basic Metabolic Panel:  Lab Results  Component Value Date   NA 139 2023-02-09   K 3.6 02-09-23   CO2 24 02-09-23   GLUCOSE 102 (H) 2023-02-09   BUN 27 (H) Feb 09, 2023   CREATININE 1.40 (H) 02-09-23   CALCIUM 9.7 09-Feb-2023   GFRNONAA 55 (L) 02-09-23   GFRAA 72 01/22/2020   Lipid Panel:  Lab Results  Component Value Date   LDLCALC 134 (H) 07/10/2022   HgbA1c:  Lab Results  Component Value Date   HGBA1C 5.6 09/02/2019   Urine Drug Screen:     Component Value Date/Time   LABOPIA NONE DETECTED 02/09/23 0039   COCAINSCRNUR NONE DETECTED 02/09/23 0039   LABBENZ NONE DETECTED 02/09/23 0039   AMPHETMU NONE DETECTED 2023/02/09 0039   THCU NONE DETECTED 09-Feb-2023 0039   LABBARB NONE DETECTED Feb 09, 2023 0039    Alcohol Level     Component Value Date/Time   ETH <10 2023-02-09 0040   INR  Lab Results  Component Value Date   INR 1.0 02-09-2023   APTT  Lab Results  Component Value Date   APTT 26 Feb 09, 2023   AED levels: No results  found for: "PHENYTOIN", "ZONISAMIDE", "LAMOTRIGINE", "LEVETIRACETA"  CT Head without contrast(Personally reviewed): 1. No acute intracranial abnormality. 2. ASPECTS is 10. 3. 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 4. Bilateral PCA territory infarctions also appear chronic.  CT angio Head and Neck with contrast(Personally reviewed): 1. 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 The left MCA is patent, likely filled by collateral flow along the circle-of-Willis. 2. Moderate multifocal stenosis of the M1 segment of the right MCA. 3. Moderate right vertebral artery stenosis. 4. Bilateral carotid bifurcation atherosclerosis with 60% stenosis of the proximal left ICA. 5. Diminutive left vertebral artery with probable short segment occlusion of the V3 segment. In addition to the above described ICA occlusion, the distal basilar artery is occluded. There is reconstitution of the basilar tip with patency of both PCAs maintained. The superior cerebellar arteries are occluded at their origin.  MRI Brain(Personally reviewed): L PCA infarct matched on T2/DWI.  ASSESSMENT   Grant Berry is a 77 y.o. male with history of hypertension, hyperlipidemia, prior history of left MCA stroke who presented to the ED with multiple falls in the setting of new onset vision deficit, dizziness. Found to have subacute L PCA and chronic R PCA stroke on CT Head along with basialr tip occlusion and L ICA terminus occlusion on CTA. Was told that [facility] did not have the ability to get CTP at this time so patient wa  emergently transferred to [facility] for a STAT MRI Brain. MRI brain shows a L occipital lobe stroke that is matched on DWI/T2 along with subacute R occipital lob infarct. No other stroke noted in the posterior circulation. Given the noted strokes are matched, he is unlikely to benefit from emergent thrombectomy. However given  unclear chronicity of the basilar occlusion, will keep him on stroke service in the Neuro ICU for 24 hours to monitor for any new deficit which may require intervention.  RECOMMENDATIONS  - Frequent Neuro checks per stroke unit protocol - Recommend obtaining TTE  - Recommend obtaining Lipid panel with LDL - Please start statin if LDL > 70 - Recommend HbA1c to evaluate for diabetes and how well it is controlled. - Antithrombotic - Aspirin 325mg  along with plavix 300mg  load, followed by Aspirin 81mg  daily along with plavix 75mg  daily x 90 days, then aspirin 81mg  daily alone. - Recommend DVT ppx - SBP goal - permissive hypertension first 24 h < 220/110. Held home meds.  - Recommend Telemetry monitoring for arrythmia - Recommend bedside swallow screen prior to PO intake. - Stroke education booklet - Recommend PT/OT/SLP consult - STAT CT HEAd and CTA for any significant changes in NIHSS. - keep head of bed flat, fluids at 144ml/hr, strict bed rest. ______________________________________________________________________  This patient is critically ill and at significant risk of neurological worsening, death and care requires constant monitoring of vital signs, hemodynamics,respiratory and cardiac monitoring, neurological assessment, discussion with family, other specialists and medical decision making of high complexity. I spent 60 minutes of neurocritical care time  in the care of  this patient. This was time spent independent of any time provided by nurse practitioner or PA.  Erick Blinks [facility] 01/31/2023  3:48 AM   Signed, Erick Blinks, MD [facility]

## 2023-01-31 NOTE — Procedures (Addendum)
INR.  Status post bilateral common carotid arteriograms.  Right CFA approach.  Findings. 1.  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  2.  High-grade approximately 65% stenosis of the proximal left ICA  Status post complete revascularization of the left ICA supraclinoid segment with 1 pass with an 071 aspiration catheter with a 4 mm by 40 mm Solitaire X retrieval device achieving aTICI 3 revascularization.  Underlying intracranial arteriosclerotic related stenosis treated with a Onyx balloon mountable stent with significantly improved caliber.  Status post stent assisted angioplasty of the high-grade left ICA proximal stenosis.  Significant narrowing of the left A1 segment of the ACA noted due to intracranial arteriosclerotic disease.  Left anterior cerebral artery A1 segment, A2 segments opacifies via ACOM from the right ICA.  Post CT of the brain no evidence of intracranial hemorrhage.  8 French Angio-Seal closure device deployed at the right groin puncture site.  Distal pulses all present unchanged from prior to procedure bilaterally.  Patient extubated.  Pupils 3 mm bilaterally sluggishly reactive.  Patient apparently moving all 4 spontaneously though less so on the right side.  Now consistently following commands.  Medications given aspirin 81 mg and Plavix 150 mg via orogastric tube prior to angioplasty.  Half bolus dose of cangrelor IV prior to stent placement intracranially followed by a 4-hour half dose infusion to end at 3 PM followed by CT of the brain  Fatima Sanger MD

## 2023-01-31 NOTE — ED Notes (Signed)
Neurology at bedside.

## 2023-01-31 NOTE — Progress Notes (Signed)
Pt awakened for 0730 exam with new aphasia. NIHSS 6 (previously 3). Dr Roda Shutters notified by secure chat. Orders received. To STAT CTP.

## 2023-01-31 NOTE — Anesthesia Procedure Notes (Addendum)
Procedure Name: Intubation Date/Time: 01/31/2023 8:39 AM  Performed by: Collene Schlichter, MDPre-anesthesia Checklist: Patient identified, Emergency Drugs available, Suction available and Patient being monitored Patient Re-evaluated:Patient Re-evaluated prior to induction Oxygen Delivery Method: Circle System Utilized Preoxygenation: Pre-oxygenation with 100% oxygen Induction Type: IV induction Ventilation: Mask ventilation without difficulty Laryngoscope Size: Mac and 4 Grade View: Grade II Tube type: Oral Tube size: 7.5 mm Number of attempts: 1 Airway Equipment and Method: Stylet and Oral airway Placement Confirmation: ETT inserted through vocal cords under direct vision, positive ETCO2 and breath sounds checked- equal and bilateral Secured at: 23 cm Tube secured with: Tape Dental Injury: Teeth and Oropharynx as per pre-operative assessment

## 2023-01-31 NOTE — ED Notes (Signed)
ED TO INPATIENT HANDOFF REPORT  ED Nurse Name and Phone #: Travin Marik, [phone] .  S Name/Age/Gender Grant Berry 77 y.o. male Room/Bed: 019C/019C  Code Status   Code Status: Full Code  Home/SNF/Other Home Patient oriented to: self, place, time, and situation Is this baseline? 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 Per EMS, patient fell 3 times last night; last fall was at 1830pm when family stated patient hit his head and "was not himself." Per EMS, family reported slurred speech, dizziness, and "falling over to the right side." Patient is able to answer questions on ED arrival with apparent dysarthria. 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   EMS vitals:  130/70 HR 176 98% O2 on room air 130 CBG RR16    Allergies Allergies  Allergen Reactions   Lisinopril     Dizziness   Atorvastatin Other (See Comments)    "Feels like crap", muscle pain, & dizziness.   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]  Admitting Physician: Erick Blinks [[id number]]  Attending Physician: Erick Blinks [[id number]]  Certification:: I certify this patient will need inpatient services for at least 2 midnights  Expected Medical Readiness: 02/05/2023          B Medical/Surgery History Past Medical History:  Diagnosis Date   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   Hyperlipidemia    Hypertension    Past Surgical History:   Procedure Laterality Date   EYE SURGERY Bilateral    TONSILECTOMY/ADENOIDECTOMY WITH MYRINGOTOMY       A IV Location/Drains/Wounds Patient Lines/Drains/Airways Status     Active Line/Drains/Airways     Name Placement date Placement time Site Days   Peripheral IV 01/31/23 16 G Left Forearm 01/31/23  0000  Forearm  less than 1            Intake/Output Last 24 hours  Intake/Output Summary (Last 24 hours) at 01/31/2023 0437 Last data filed at 01/31/2023 0335 Gross per 24 hour  Intake --  Output 200 ml  Net -200 ml    Labs/Imaging Results for orders placed or performed during the hospital encounter of 01/31/23 (from the past 48 hour(s))  Urine rapid drug screen (hosp performed)     Status: None   Collection Time: 01/31/23 12:39 AM  Result Value Ref Range   Opiates NONE DETECTED NONE DETECTED   Cocaine NONE DETECTED NONE DETECTED   Benzodiazepines NONE DETECTED NONE DETECTED   Amphetamines NONE DETECTED NONE DETECTED   Tetrahydrocannabinol NONE DETECTED NONE DETECTED   Barbiturates NONE DETECTED NONE DETECTED    Comment: (NOTE) DRUG SCREEN FOR MEDICAL PURPOSES ONLY.  IF CONFIRMATION IS NEEDED FOR ANY PURPOSE, NOTIFY LAB WITHIN 5 DAYS.  LOWEST DETECTABLE LIMITS FOR URINE DRUG SCREEN Drug Class                     Cutoff (ng/mL) Amphetamine and metabolites    1000 Barbiturate  and metabolites    200 Benzodiazepine                 200 Opiates and metabolites        300 Cocaine and metabolites        300 THC                            50 Performed at [facility], [address] [place], [place] [address]   Urinalysis, Routine w reflex microscopic -Urine, Clean Catch     Status: Abnormal   Collection Time: 01/31/23 12:39 AM  Result Value Ref Range   Color, Urine AMBER (A) YELLOW    Comment: BIOCHEMICALS MAY BE AFFECTED BY COLOR   APPearance CLOUDY (A) CLEAR   Specific Gravity, Urine 1.017 1.005 - 1.030   pH 5.0 5.0 - 8.0   Glucose, UA NEGATIVE NEGATIVE mg/dL    Hgb urine dipstick NEGATIVE NEGATIVE   Bilirubin Urine NEGATIVE NEGATIVE   Ketones, ur 5 (A) NEGATIVE mg/dL   Protein, ur NEGATIVE NEGATIVE mg/dL   Nitrite NEGATIVE NEGATIVE   Leukocytes,Ua NEGATIVE NEGATIVE    Comment: Performed at [facility], [address], [place], [place] [address]  Ethanol     Status: None   Collection Time: 01/31/23 12:40 AM  Result Value Ref Range   Alcohol, Ethyl (B) <10 <10 mg/dL    Comment: (NOTE) Lowest detectable limit for serum alcohol is 10 mg/dL.  For medical purposes only. Performed at [facility], [address] [place], [place] [address]   Protime-INR     Status: None   Collection Time: 01/31/23 12:40 AM  Result Value Ref Range   Prothrombin Time 13.6 11.4 - 15.2 seconds   INR 1.0 0.8 - 1.2    Comment: (NOTE) INR goal varies based on device and disease states. Performed at [facility], [address], [place], [place] [address]   APTT     Status: None   Collection Time: 01/31/23 12:40 AM  Result Value Ref Range   aPTT 26 24 - 36 seconds    Comment: Performed at [facility], [address] [place], [place] [address]  CBC     Status: Abnormal   Collection Time: 01/31/23 12:40 AM  Result Value Ref Range   WBC 6.7 4.0 - 10.5 K/uL   RBC 4.15 (L) 4.22 - 5.81 MIL/uL   Hemoglobin 12.5 (L) 13.0 - 17.0 g/dL   HCT 13.0 (L) 86.5 - 78.4 %   MCV 90.6 80.0 - 100.0 fL   MCH 30.1 26.0 - 34.0 pg   MCHC 33.2 30.0 - 36.0 g/dL   RDW 69.6 29.5 - 28.4 %   Platelets 192 150 - 400 K/uL   nRBC 0.0 0.0 - 0.2 %    Comment: Performed at [facility], [address] [place], [place] [address]  Differential     Status: None   Collection Time: 01/31/23 12:40 AM  Result Value Ref Range   Neutrophils Relative % 67 %   Neutro Abs 4.5 1.7 - 7.7 K/uL   Lymphocytes Relative 21 %   Lymphs Abs 1.4 0.7 - 4.0 K/uL   Monocytes Relative 9 %   Monocytes Absolute 0.6 0.1 - 1.0 K/uL   Eosinophils Relative 2 %   Eosinophils Absolute 0.1 0.0 - 0.5 K/uL   Basophils  Relative 1 %   Basophils Absolute 0.0 0.0 - 0.1 K/uL   Immature Granulocytes 0 %  Abs Immature Granulocytes 0.02 0.00 - 0.07 K/uL    Comment: Performed at [facility], [address], [place], [place] [address]  Comprehensive metabolic panel     Status: Abnormal   Collection Time: 01/31/23 12:40 AM  Result Value Ref Range   Sodium 139 135 - 145 mmol/L   Potassium 3.5 3.5 - 5.1 mmol/L   Chloride 104 98 - 111 mmol/L   CO2 24 22 - 32 mmol/L   Glucose, Bld 102 (H) 70 - 99 mg/dL    Comment: Glucose reference range applies only to samples taken after fasting for at least 8 hours.   BUN 27 (H) 8 - 23 mg/dL   Creatinine, Ser 6.04 (H) 0.61 - 1.24 mg/dL   Calcium 9.7 8.9 - 54.0 mg/dL   Total Protein 7.2 6.5 - 8.1 g/dL   Albumin 4.0 3.5 - 5.0 g/dL   AST 27 15 - 41 U/L   ALT 15 0 - 44 U/L   Alkaline Phosphatase 55 38 - 126 U/L   Total Bilirubin 0.5 <1.2 mg/dL   GFR, Estimated 55 (L) >60 mL/min    Comment: (NOTE) Calculated using the CKD-EPI Creatinine Equation (2021)    Anion gap 11 5 - 15    Comment: Performed at [facility], [address] [place], [place] [address]  I-stat chem 8, ED     Status: Abnormal   Collection Time: 01/31/23 12:42 AM  Result Value Ref Range   Sodium 139 135 - 145 mmol/L   Potassium 3.6 3.5 - 5.1 mmol/L   Chloride 102 98 - 111 mmol/L   BUN 27 (H) 8 - 23 mg/dL   Creatinine, Ser 1.47 (H) 0.61 - 1.24 mg/dL   Glucose, Bld 829 (H) 70 - 99 mg/dL    Comment: Glucose reference range applies only to samples taken after fasting for at least 8 hours.   Calcium, Ion 1.20 1.15 - 1.40 mmol/L   TCO2 23 22 - 32 mmol/L   Hemoglobin 12.9 (L) 13.0 - 17.0 g/dL   HCT 56.2 (L) 13.0 - 86.5 %   MR BRAIN WO CONTRAST  Result Date: 01/31/2023 CLINICAL DATA:  Dizziness and gait instability. Large vessel occlusion. EXAM: MRI HEAD WITHOUT CONTRAST TECHNIQUE: Multiplanar, multiecho pulse sequences of the brain and surrounding structures were obtained without intravenous contrast.  COMPARISON:  CTA head neck 01/31/2023 Brain MRI 09/01/2019 FINDINGS: Brain: There are areas of acute/early subacute ischemia within both occipital lobes, left-greater-than-right. These areas correspond to the areas of infarction seen on the earlier noncontrast head CT (suggested to be chronic at that time). There is no acute ischemia of the brainstem, cerebellum or anterior circulation. There is multifocal hyperintense T2-weighted signal within the white matter. Generalized volume loss. There is hyperintense T2-weighted signal that corresponds to both sites of infarction. There is a component of chronic infarct within the superior right occipital lobe (series 11, image 25). There are multiple old cerebellar infarcts. The midline structures are normal. Vascular: Abnormal distal basilar artery flow void in keeping with known occlusion. Abnormal flow void at the skull base left ICA covers a longer segment than the demonstrated occlusion on the earlier CTA, likely due to inhibited upstream flow. Skull and upper cervical spine: Normal calvarium and skull base. Visualized upper cervical spine and soft tissues are normal. Sinuses/Orbits:No paranasal sinus fluid levels or advanced mucosal thickening. No mastoid or middle ear effusion. Normal orbits. IMPRESSION: 1. Acute/early subacute infarcts within both occipital lobes, left-greater-than-right. 2. No brainstem, cerebellar or anterior circulation  acute ischemia. 3. Abnormal skull base flow voids in keeping with known vascular and left ICA occlusions. 4. Old superior right occipital and bilateral cerebellar infarcts. Electronically Signed   By: Deatra Robinson M.D.   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 There is reconstitution of the basilar tip with patency of both PCAs maintained. The  superior cerebellar arteries are occluded at their origin. Please note that this addendum was previously mistakenly associated with the noncontrast head CT for this patient performed on the same day. Electronically Signed   By: Deatra Robinson M.D.   On: 01/31/2023 02:45   Result Date: 01/31/2023 CLINICAL DATA:  Fall with dysarthria and dizziness EXAM: CT ANGIOGRAPHY HEAD AND NECK WITH AND WITHOUT CONTRAST TECHNIQUE: Multidetector CT imaging of the head and neck was performed using the standard protocol during bolus administration of intravenous contrast. Multiplanar CT image reconstructions and MIPs were obtained to evaluate the vascular anatomy. Carotid stenosis measurements (when applicable) are obtained utilizing NASCET criteria, using the distal internal carotid diameter as the denominator. RADIATION DOSE REDUCTION: This exam was performed according to the departmental dose-optimization program which includes automated exposure control, adjustment of the mA and/or kV according to patient size and/or use of iterative reconstruction technique. CONTRAST:  75mL OMNIPAQUE IOHEXOL 350 MG/ML SOLN COMPARISON:  None Available. FINDINGS: CTA NECK FINDINGS SKELETON: No acute abnormality or high grade bony spinal canal stenosis. OTHER NECK: Normal pharynx, larynx and major salivary glands. No cervical lymphadenopathy. Unremarkable thyroid gland. UPPER CHEST: No pneumothorax or pleural effusion. No nodules or masses. AORTIC ARCH: There is calcific atherosclerosis of the aortic arch. Conventional 3 vessel aortic branching pattern. RIGHT CAROTID SYSTEM: No dissection, occlusion or aneurysm. There is mixed density atherosclerosis extending into the proximal ICA, resulting in less than 50% stenosis. LEFT CAROTID SYSTEM: No dissection, occlusion or aneurysm. There is mixed density atherosclerosis extending into the proximal ICA, resulting in 60% stenosis. VERTEBRAL ARTERIES: Right dominant configuration. Diminutive left  vertebral artery with probable short segment occlusion of the V3 segment. Normal right vertebral artery to the skull base. CTA HEAD FINDINGS POSTERIOR CIRCULATION: Right vertebral artery atherosclerosis with moderate stenosis. Normal left V4 segment. No proximal occlusion of the anterior or inferior cerebellar arteries. Basilar artery is normal. Superior cerebellar arteries are normal. Posterior cerebral arteries are normal. ANTERIOR CIRCULATION: The left ICA is occluded at its terminus within occluded length of approximately 9 mm. The right ICA is mildly atherosclerotic without stenosis. Anterior cerebral arteries are normal. Moderate multifocal stenosis of the M1 segment of the right MCA. The left MCA is patent, likely filled by collateral flow along the circle-of-Willis. There is multifocal mild atherosclerotic irregularity without high-grade stenosis. VENOUS SINUSES: As permitted by contrast timing, patent. ANATOMIC VARIANTS: None Review of the MIP images confirms the above findings. IMPRESSION: 1. 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 The left MCA is patent, likely filled by collateral flow along the circle-of-Willis. 2. Moderate multifocal stenosis of the M1 segment of the right MCA. 3. Moderate right vertebral artery stenosis. 4. Bilateral carotid bifurcation atherosclerosis with 60% stenosis of the proximal left ICA. 5. Diminutive left vertebral artery with probable short segment occlusion of the V3 segment. Aortic Atherosclerosis (ICD10-I70.0). Critical Value/emergent results were called by telephone at the  time of interpretation on 01/31/2023 at 1:17 am to provider [facility] , who verbally acknowledged these results. Electronically Signed: By: Deatra Robinson M.D. On: 01/31/2023 01:18   CT HEAD CODE STROKE WO CONTRAST`  Addendum Date: 01/31/2023   ADDENDUM REPORT: 01/31/2023 01:30 ADDENDUM: In addition to the above described ICA  occlusion, the distal basilar artery is occluded. There is reconstitution of the basilar tip with patency of both PCAs maintained. The superior cerebellar arteries are occluded at their origin. Electronically Signed   By: Deatra Robinson M.D.   On: 01/31/2023 01:30   Result Date: 01/31/2023 CLINICAL DATA:  Code stroke.  Fall with dizziness EXAM: CT HEAD WITHOUT CONTRAST TECHNIQUE: Contiguous axial images were obtained from the base of the skull through the vertex without intravenous contrast. RADIATION DOSE REDUCTION: This exam was performed according to the departmental dose-optimization program which includes automated exposure control, adjustment of the mA and/or kV according to patient size and/or use of iterative reconstruction technique. COMPARISON:  None Available. FINDINGS: Brain: There is no mass, hemorrhage or extra-axial collection. The size and configuration of the ventricles and extra-axial CSF spaces are normal. There are multiple old cerebellar small vessel infarcts. Bilateral PCA territory infarctions also appear chronic. There is mild white matter hypoattenuation. Vascular: Atherosclerosis of the carotid and vertebral arteries at the skull base. Skull: Normal Sinuses/Orbits: No fluid levels or advanced mucosal thickening of the visualized paranasal sinuses. No mastoid or middle ear effusion. The orbits are normal. ASPECTS [facility] Stroke Program Early CT Score) - Ganglionic level infarction (caudate, lentiform nuclei, internal capsule, insula, M1-M3 cortex): 7 - Supraganglionic infarction (M4-M6 cortex): 3 Total score (0-10 with 10 being normal): 10 IMPRESSION: 1. No acute intracranial abnormality. 2. ASPECTS is 10. 3. 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 4. Bilateral PCA territory infarctions also appear chronic. These results were called by telephone at the time of interpretation on 01/31/2023 at 1:00 am to provider [facility] , who verbally acknowledged these results. Electronically  Signed: By: Deatra Robinson M.D. On: 01/31/2023 01:00   CT C-SPINE NO CHARGE  Result Date: 01/31/2023 CLINICAL DATA:  Neck pain, known left ICA occlusion EXAM: CT CERVICAL SPINE WITHOUT CONTRAST TECHNIQUE: Multidetector CT imaging of the cervical spine was performed without intravenous contrast. Multiplanar CT image reconstructions were also generated. RADIATION DOSE REDUCTION: This exam was performed according to the departmental dose-optimization program which includes automated exposure control, adjustment of the mA and/or kV according to patient size and/or use of iterative reconstruction technique. COMPARISON:  None Available. FINDINGS: Alignment: Within normal limits. Skull base and vertebrae: 7 cervical segments are well visualized. Vertebral body height is well maintained. Mild osteophytic change and facet hypertrophic changes noted. No acute fracture or acute facet abnormality is noted. The odontoid is within normal limits. Soft tissues and spinal canal: Surrounding soft tissue structures appear within normal limits. Upper chest: Visualized lung apices are unremarkable with the exception of some scarring in the right apex. Other: None IMPRESSION: Multilevel degenerative change without acute bony abnormality. Electronically Signed   By: Alcide Clever M.D.   On: 01/31/2023 01:25    Pending Labs Unresulted Labs (From admission, onward)     Start     Ordered   02/07/23 0500  Creatinine, serum  (enoxaparin (LOVENOX)    CrCl >/= 30 ml/min)  Weekly,   R     Comments: while on enoxaparin therapy    01/31/23 0351   01/31/23 0500  Lipid panel  (Labs)  Tomorrow morning,  R       Comments: Fasting    01/31/23 0351   01/31/23 0349  Hemoglobin A1c  (Labs)  Once,   R       Comments: To assess prior glycemic control    01/31/23 0351   01/31/23 0349  CBC  (enoxaparin (LOVENOX)    CrCl >/= 30 ml/min)  Once,   R       Comments: Baseline for enoxaparin therapy IF NOT ALREADY DRAWN.  Notify MD if PLT < 100  K.    01/31/23 0351            Vitals/Pain Today's Vitals   01/31/23 0315 01/31/23 0330 01/31/23 0331 01/31/23 0400  BP: (!) 149/77 (!) 155/90    Pulse: 77 75    Resp:  17    Temp:  98.6 F (37 C)    TempSrc:  Oral    SpO2: 95% 98%    Weight:    62.1 kg  Height:    5\' 7"  (1.702 m)  PainSc:   0-No pain     Isolation Precautions No active isolations  Medications Medications   stroke: early stages of recovery book (has no administration in time range)  0.9 %  sodium chloride infusion (has no administration in time range)  acetaminophen (TYLENOL) tablet 650 mg (has no administration in time range)    Or  acetaminophen (TYLENOL) 160 MG/5ML solution 650 mg (has no administration in time range)    Or  acetaminophen (TYLENOL) suppository 650 mg (has no administration in time range)  senna-docusate (Senokot-S) tablet 1 tablet (has no administration in time range)  enoxaparin (LOVENOX) injection 40 mg (has no administration in time range)  aspirin EC tablet 81 mg (has no administration in time range)  clopidogrel (PLAVIX) tablet 300 mg (has no administration in time range)  clopidogrel (PLAVIX) tablet 75 mg (has no administration in time range)  iohexol (OMNIPAQUE) 350 MG/ML injection 75 mL (75 mLs Intravenous Contrast Given 01/31/23 0055)  aspirin suppository 300 mg (300 mg Rectal Given 01/31/23 0205)    Mobility walks with person assist     Focused Assessments Cardiac Assessment Handoff:  Cardiac Rhythm: Normal sinus rhythm No results found for: "CKTOTAL", "CKMB", "CKMBINDEX", "TROPONINI" No results found for: "DDIMER" Does the Patient currently have chest pain? No   , Neuro Assessment Handoff:  Swallow screen pass? Yes  Cardiac Rhythm: Normal sinus rhythm NIH Stroke Scale  Dizziness Present: No Headache Present: No Interval: Initial Level of Consciousness (1a.)   : Alert, keenly responsive LOC Questions (1b. )   : Answers both questions correctly LOC  Commands (1c. )   : Performs both tasks correctly Best Gaze (2. )  : Normal Visual (3. )  : Partial hemianopia Facial Palsy (4. )    : Normal symmetrical movements Motor Arm, Left (5a. )   : No drift Motor Arm, Right (5b. ) : No drift Motor Leg, Left (6a. )  : No drift Motor Leg, Right (6b. ) : No drift Limb Ataxia (7. ): Present in two limbs Sensory (8. )  : Normal, no sensory loss Best Language (9. )  : No aphasia Dysarthria (10. ): Mild-to-moderate dysarthria, patient slurs at least some words and, at worst, can be understood with some difficulty Extinction/Inattention (11.)   : No Abnormality Complete NIHSS TOTAL: 4 Last date known well: 01/30/23 Last time known well: 1830 Neuro Assessment: Exceptions to WDL Neuro Checks:   Initial (01/31/23 0100)  Has TPA  been given? No If patient is a Neuro Trauma and patient is going to OR before floor call report to 4N Charge nurse: [phone] or [phone]   R Recommendations: See Admitting Provider Note  Report given to:   Additional Notes: pt uses urinal often, needs assistance to stand, high fall risk

## 2023-01-31 NOTE — Consult Note (Signed)
Code Stroke 0026: Code stroke cart activated at this time. TSRN Prealerted. 5284: EDP assessing upon arrival Pt with Dizziness since this morning. Fell 3x. Previous hx of stroke with residual L side deficits. Pt states he's unable to walk today; feels like he's pulling to the right.  Running code stroke per EDP at this time. Requested that staff pre-pull TNK at this time: No, likely out of tnk window 0044: Patient left for CT at this time.  1324: TSP paged at this time.  0100: Patient returned from CT at this time.  80: Dr. Leslie Dales on camera at this time. Patient report and history provided.  4010: Dr. Leslie Dales performing neuro assessment at this time. NCCT imaging results provided on camera.  0134: CTA/CTP imaging results provided to Dr. Leslie Dales via secure chat MRS 3, NIH 6 [phone] [facility] called by TSRN and [facility] number was provided.  [phone] [facility] calls Dr. Leslie Dales [phone] Closed loop  to determine plan for patient with Dr. Leslie Dales. Pt transferred to [facility] for Stat MRI, intervention decision pending MRI results.

## 2023-01-31 NOTE — Progress Notes (Addendum)
PT Cancellation Note  Patient Details Name: Grant Berry MRN: [id number] DOB: 08-10-1945   Cancelled Treatment:    Reason Eval/Treat Not Completed: (P) Active bedrest order. Will plan to follow-up as time permits once bedrest orders are lifted.   Addendum 9:12 - RN reports pt is in IR and requesting PT hold off today and follow-up tomorrow as able.   Virgil Benedict, PT, DPT Acute Rehabilitation Services  Office: [phone]    Bettina Gavia 01/31/2023, 7:40 AM

## 2023-01-31 NOTE — ED Triage Notes (Signed)
Patient arrives with [facility] from home; CODE STROKE called. Per EMS, patient fell 3 times last night; last fall was at 1830pm when family stated patient hit his head and "was not himself." Per EMS, family reported slurred speech, dizziness, and "falling over to the right side." Patient is able to answer questions on ED arrival with apparent dysarthria. 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   EMS vitals:  130/70 HR 176 98% O2 on room air 130 CBG RR16

## 2023-01-31 NOTE — ED Notes (Signed)
Pt bib [facility] coming from [facility] for emergent MRI. [facility] reports LKW being 1830 yesterday, pt having 3 recent falls with no known LOC. CL reports right sided deficit and NIH score of 5. Patient alert and oriented to self, situation, location but disoriented to time.  [facility] vital signs:   77 HR 96% RA 139/82

## 2023-01-31 NOTE — ED Provider Notes (Signed)
D/w dr Derry Lory He will admit to stroke service No acute intervention required at this time   Zadie Rhine, MD 01/31/23 [phone]

## 2023-01-31 NOTE — Progress Notes (Signed)
Notified by Dr Roda Shutters that Dr. Corliss Skains wants to do angio. Code IR activated by Dr Roda Shutters. Pt taken from CT to IR by me. Arrived IR area 0810.  Report given to CRNA. Covid swab obtained. Report given to RN when she arrived. (Estimated time 0830).

## 2023-02-01 ENCOUNTER — Encounter (HOSPITAL_COMMUNITY): Payer: Self-pay | Admitting: Radiology

## 2023-02-01 ENCOUNTER — Inpatient Hospital Stay (HOSPITAL_COMMUNITY): Payer: Medicare Other

## 2023-02-01 DIAGNOSIS — I6389 Other cerebral infarction: Secondary | ICD-10-CM | POA: Diagnosis not present

## 2023-02-01 LAB — CBC WITH DIFFERENTIAL/PLATELET
Abs Immature Granulocytes: 0.02 10*3/uL (ref 0.00–0.07)
Basophils Absolute: 0.1 10*3/uL (ref 0.0–0.1)
Basophils Relative: 1 %
Eosinophils Absolute: 0.1 10*3/uL (ref 0.0–0.5)
Eosinophils Relative: 2 %
HCT: 29.1 % — ABNORMAL LOW (ref 39.0–52.0)
Hemoglobin: 9.3 g/dL — ABNORMAL LOW (ref 13.0–17.0)
Immature Granulocytes: 0 %
Lymphocytes Relative: 17 %
Lymphs Abs: 1.1 10*3/uL (ref 0.7–4.0)
MCH: 29.6 pg (ref 26.0–34.0)
MCHC: 32 g/dL (ref 30.0–36.0)
MCV: 92.7 fL (ref 80.0–100.0)
Monocytes Absolute: 0.6 10*3/uL (ref 0.1–1.0)
Monocytes Relative: 9 %
Neutro Abs: 4.5 10*3/uL (ref 1.7–7.7)
Neutrophils Relative %: 71 %
Platelets: 153 10*3/uL (ref 150–400)
RBC: 3.14 MIL/uL — ABNORMAL LOW (ref 4.22–5.81)
RDW: 14.7 % (ref 11.5–15.5)
WBC: 6.3 10*3/uL (ref 4.0–10.5)
nRBC: 0 % (ref 0.0–0.2)

## 2023-02-01 LAB — ECHOCARDIOGRAM COMPLETE
Area-P 1/2: 1.73 cm2
Height: 67 in
S' Lateral: 3.2 cm
Weight: 1908.3 [oz_av]

## 2023-02-01 LAB — BASIC METABOLIC PANEL
Anion gap: 9 (ref 5–15)
BUN: 9 mg/dL (ref 8–23)
CO2: 23 mmol/L (ref 22–32)
Calcium: 8.3 mg/dL — ABNORMAL LOW (ref 8.9–10.3)
Chloride: 105 mmol/L (ref 98–111)
Creatinine, Ser: 1.06 mg/dL (ref 0.61–1.24)
GFR, Estimated: >60 mL/min (ref >60)
Glucose, Bld: 80 mg/dL (ref 70–99)
Potassium: 3.7 mmol/L (ref 3.5–5.1)
Sodium: 137 mmol/L (ref 135–145)

## 2023-02-01 LAB — CBG MONITORING, ED: Glucose-Capillary: 93 mg/dL (ref 70–99)

## 2023-02-01 LAB — SARS CORONAVIRUS 2 (TAT 6-24 HRS): SARS Coronavirus 2: NEGATIVE

## 2023-02-01 NOTE — Progress Notes (Signed)
OT Cancellation Note  Patient Details Name: Grant Berry MRN: [id number] DOB: Feb 05, 1946   Cancelled Treatment:    Reason Eval/Treat Not Completed: Active bedrest order  Mateo Flow 02/01/2023, 8:06 AM

## 2023-02-01 NOTE — Plan of Care (Signed)
  Problem: Education: Goal: Knowledge of disease or condition will improve Outcome: Progressing Goal: Knowledge of secondary prevention will improve (MUST DOCUMENT ALL) Outcome: Progressing Goal: Knowledge of patient specific risk factors will improve Loraine Leriche N/A or DELETE if not current risk factor) Outcome: Progressing   Problem: Ischemic Stroke/TIA Tissue Perfusion: Goal: Complications of ischemic stroke/TIA will be minimized Outcome: Progressing   Problem: Coping: Goal: Will verbalize positive feelings about self Outcome: Progressing Goal: Will identify appropriate support needs Outcome: Progressing   Problem: Health Behavior/Discharge Planning: Goal: Ability to manage health-related needs will improve Outcome: Progressing Goal: Goals will be collaboratively established with patient/family Outcome: Progressing   Problem: Self-Care: Goal: Ability to participate in self-care as condition permits will improve Outcome: Progressing Goal: Verbalization of feelings and concerns over difficulty with self-care will improve Outcome: Progressing Goal: Ability to communicate needs accurately will improve Outcome: Progressing   Problem: Nutrition: Goal: Risk of aspiration will decrease Outcome: Progressing Goal: Dietary intake will improve Outcome: Progressing   Problem: Education: Goal: Understanding of CV disease, CV risk reduction, and recovery process will improve Outcome: Progressing Goal: Individualized Educational Video(s) Outcome: Progressing   Problem: Activity: Goal: Ability to return to baseline activity level will improve Outcome: Progressing   Problem: Cardiovascular: Goal: Ability to achieve and maintain adequate cardiovascular perfusion will improve Outcome: Progressing Goal: Vascular access site(s) Level 0-1 will be maintained Outcome: Progressing   Problem: Health Behavior/Discharge Planning: Goal: Ability to safely manage health-related needs after  discharge will improve Outcome: Progressing

## 2023-02-01 NOTE — Progress Notes (Signed)
Inpatient Rehab Admissions Coordinator:  ? ?Per therapy recommendations,  patient was screened for CIR candidacy by Devaney Segers, MS, CCC-SLP. At this time, Pt. Appears to be a a potential candidate for CIR. I will place   order for rehab consult per protocol for full assessment. Please contact me any with questions. ? ?Trine Fread, MS, CCC-SLP ?Rehab Admissions Coordinator  ?[phone] (celll) ?[phone] (office) ? ?

## 2023-02-01 NOTE — Progress Notes (Signed)
STROKE TEAM PROGRESS NOTE   SUBJECTIVE (INTERVAL HISTORY) No family is at the bedside.  Patient is lying in bed, awake alert and mostly orientated. BP improved after albumin. Moving all extremities. 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 MRI showed progressive left PCA infarct.    OBJECTIVE Temp:  [98.7 F (37.1 C)-99.4 F (37.4 C)] 99.1 F (37.3 C) (12/09 0800) Pulse Rate:  [60-93] 74 (12/09 1000) Cardiac Rhythm: Normal sinus rhythm (12/09 0800) Resp:  [11-23] 14 (12/09 1000) BP: (94-144)/(41-118) 144/68 (12/09 1000) SpO2:  [91 %-100 %] 98 % (12/09 1000) Arterial Line BP: (101-152)/(49-68) 109/55 (12/08 1815) FiO2 (%):  [28 %] 28 % (12/09 0400)  Recent Labs  Lab 01/31/23 0035  GLUCAP 93   Recent Labs  Lab 01/31/23 0040 01/31/23 0042 02/01/23 0444  NA 139 139 137  K 3.5 3.6 3.7  CL 104 102 105  CO2 24  --  23  GLUCOSE 102* 102* 80  BUN 27* 27* 9  CREATININE 1.33* 1.40* 1.06  CALCIUM 9.7  --  8.3*   Recent Labs  Lab 01/31/23 0040  AST 27  ALT 15  ALKPHOS 55  BILITOT 0.5  PROT 7.2  ALBUMIN 4.0   Recent Labs  Lab 01/31/23 0040 01/31/23 0042 01/31/23 0454 02/01/23 0444  WBC 6.7  --  6.4 6.3  NEUTROABS 4.5  --   --  4.5  HGB 12.5* 12.9* 12.9* 9.3*  HCT 37.6* 38.0* 38.9* 29.1*  MCV 90.6  --  89.6 92.7  PLT 192  --  196 153   No results for input(s): "CKTOTAL", "CKMB", "CKMBINDEX", "TROPONINI" in the last 168 hours. Recent Labs    01/31/23 0040  LABPROT 13.6  INR 1.0   Recent Labs    01/31/23 0039  COLORURINE AMBER*  LABSPEC 1.017  PHURINE 5.0  GLUCOSEU NEGATIVE  HGBUR NEGATIVE  BILIRUBINUR NEGATIVE  KETONESUR 5*  PROTEINUR NEGATIVE  NITRITE NEGATIVE  LEUKOCYTESUR NEGATIVE       Component Value Date/Time   CHOL 235 (H) 01/31/2023 0454   CHOL 202 (H) 07/10/2022 1012   CHOL 219 (H) 06/17/2012 1620   TRIG 47 01/31/2023 0454   TRIG 215 (H) 06/17/2012 1620   HDL 59 01/31/2023 0454   HDL 48 07/10/2022 1012   HDL 34 (L)  06/17/2012 1620   CHOLHDL 4.0 01/31/2023 0454   VLDL 9 01/31/2023 0454   LDLCALC 167 (H) 01/31/2023 0454   LDLCALC 134 (H) 07/10/2022 1012   LDLCALC 142 (H) 06/17/2012 1620   Lab Results  Component Value Date   HGBA1C 5.1 01/31/2023      Component Value Date/Time   LABOPIA NONE DETECTED 01/31/2023 0039   COCAINSCRNUR NONE DETECTED 01/31/2023 0039   LABBENZ NONE DETECTED 01/31/2023 0039   AMPHETMU NONE DETECTED 01/31/2023 0039   THCU NONE DETECTED 01/31/2023 0039   LABBARB NONE DETECTED 01/31/2023 0039    Recent Labs  Lab 01/31/23 0040  ETH <10    I have personally reviewed the radiological images below and agree with the radiology interpretations.  MR BRAIN WO CONTRAST  Result Date: 02/01/2023 CLINICAL DATA:  Stroke and embolectomy. EXAM: MRI HEAD WITHOUT CONTRAST TECHNIQUE: Multiplanar, multiecho pulse sequences of the brain and surrounding structures were obtained without intravenous contrast. COMPARISON:  Head CT and MRI from yesterday FINDINGS: Brain: Moderate acute infarct in the left occipital cortex, increased from yesterday. Punctate acute infarct in the superior left frontal white matter since prior. There are areas of low-grade restricted diffusion  in the left periatrial white matter and right occipital pole attributed to subacute ischemia. Subarachnoid hemorrhage along the anterior left frontal convexity, distinct from any areas of acute infarction and known from prior head CT. Pre-existing bilateral cerebellar and brainstem small infarcts. Chronic lacunar infarcts in theright caudate. Ischemic gliosis is mild. 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 Generalized brain atrophy. Vascular: No flow seen in the left vertebral artery. There is a basilar flow void. Skull and upper cervical spine: No focal marrow lesion Sinuses/Orbits: No acute finding IMPRESSION: 1. Moderate acute infarct in the left occipital lobe with mild progression  from yesterday. Interval punctate acute infarct in the left frontal white matter. 2. Areas of chronic and subacute ischemia preferentially affecting the posterior circulation. 3. Known subarachnoid hemorrhage at the anterior left frontal convexity, stable from preceding head CT. Electronically Signed   By: Tiburcio Pea M.D.   On: 02/01/2023 04:58   CT HEAD WO CONTRAST ( )  Result Date: 01/31/2023 CLINICAL DATA:  Stroke, follow-up; post canrelor infusion EXAM: CT HEAD WITHOUT CONTRAST TECHNIQUE: Contiguous axial images were obtained from the base of the skull through the vertex without intravenous contrast. RADIATION DOSE REDUCTION: This exam was performed according to the departmental dose-optimization program which includes automated exposure control, adjustment of the mA and/or kV according to patient size and/or use of iterative reconstruction technique. COMPARISON:  CT head 10/01/2022 and intra procedural CT 10/01/2022 FINDINGS: Brain: Hyperdense material in the left frontal sulci (series 5, image 47 and 60 and series 3, images 15-18 and 25 area and series 3, image 25), which may represent subarachnoid hemorrhage versus contrast staining. This was not apparent on the immediate postprocedural CT head. Redemonstrated cytotoxic edema in the in the left occipital lobe (series 3, image 14), which is slightly more prominent than on the prior exam. More heterogeneous hypodensity in the right occipital lobe, which is favored to be chronic remote infarcts in the pons and bilateral cerebellar hemispheres. No evidence of acute infarction, hemorrhage, mass, mass effect, or midline shift. No hydrocephalus or extra-axial fluid collection. Vascular: No hyperdense vessel. Atherosclerotic calcifications in the intracranial carotid and vertebral arteries. Skull: Negative for fracture or focal lesion. Sinuses/Orbits: Mucosal thickening in the ethmoid air cells. No acute finding in the orbits. Status post bilateral lens  replacements. Other: The mastoid air cells are well aerated. IMPRESSION: 1. Hyperdense material in the left frontal sulci, which may represent subarachnoid hemorrhage versus contrast staining. This was not apparent on the immediate postprocedural CT head. Recommend short-term follow-up head CT. 2. Redemonstrated cytotoxic edema in the left occipital lobe, which is slightly more prominent than on the prior exam. These results will be called to the ordering clinician or representative by the Radiologist Assistant, and communication documented in the PACS or [employer]. Electronically Signed   By: Wiliam Ke M.D.   On: 01/31/2023 20:52   CT CEREBRAL PERFUSION W CONTRAST  Result Date: 01/31/2023 CLINICAL DATA:  Code stroke. 77 year old male. CTA yesterday demonstrating left ICA terminus, distal left vertebral artery, Basilar artery occlusions. EXAM: CT PERFUSION BRAIN TECHNIQUE: Multiphase CT imaging of the brain was performed following IV bolus contrast injection. Subsequent parametric perfusion maps were calculated using RAPID software. RADIATION DOSE REDUCTION: This exam was performed according to the departmental dose-optimization program which includes automated exposure control, adjustment of the mA and/or kV according to patient size and/or use of iterative reconstruction technique. CONTRAST:  40mL OMNIPAQUE IOHEXOL 350 MG/ML SOLN COMPARISON:  Head CTs,  CTA head and neck, and brain MRI earlier today. FINDINGS: CT Brain Perfusion Findings: CBF (<30%) Volume: 0mL. No CBF or CBV parameter abnormality is detected. Perfusion (Tmax>6.0s) volume: . Fairly symmetric abnormal T-max throughout the bilateral cerebellum, posterior cerebral hemispheres. T-max > 4 seconds is more pronounced in the left cerebral hemisphere including the left MCA territory. There is volume of minimal T-max > 10 seconds in the right posterior circulation. Mismatch Volume: Infarct Core: No infarct core is detected  Infarction Location:Oligemia throughout the posterior circulation, to a lesser extent the left MCA territory. IMPRESSION: Oligemia throughout the bilateral posterior circulation, including the posterior fossa. Less pronounced oligemia in the Left MCA territory. No infarct core, CBF or CBV parameter abnormality detected by CTP. These results were communicated to Dr. Roda Shutters at 8:12 am on 01/31/2023 by text page via the [facility] messaging system. Electronically Signed   By: Odessa Fleming M.D.   On: 01/31/2023 08:12   CT HEAD CODE STROKE WO CONTRAST  Result Date: 01/31/2023 CLINICAL DATA:  Code stroke. 77 year old male. CTA yesterday demonstrating left ICA terminus, distal left vertebral artery, Basilar artery occlusions. EXAM: CT HEAD WITHOUT CONTRAST TECHNIQUE: Contiguous axial images were obtained from the base of the skull through the vertex without intravenous contrast. RADIATION DOSE REDUCTION: This exam was performed according to the departmental dose-optimization program which includes automated exposure control, adjustment of the mA and/or kV according to patient size and/or use of iterative reconstruction technique. COMPARISON:  Brain MRI 0244 hours today. CTA head and neck 0056 hours today. Plain head CT 0050 hours today. FINDINGS: Brain: Left occipital pole cytotoxic edema is slightly more conspicuous on CT now. Heterogeneous right occipital pole redemonstrated. Chronic left central pontine and bilateral cerebellar infarcts on MRI appears stable by CT. No acute intracranial hemorrhage identified. No new cytotoxic edema compared to the earlier MRI. No midline shift, mass effect, or evidence of intracranial mass lesion. No ventriculomegaly. Vascular: Extensive Calcified atherosclerosis at the skull base. See also CTA earlier today. Skull: Stable and intact. Sinuses/Orbits: Visualized paranasal sinuses and mastoids are clear. Other: No acute orbit or scalp soft tissue finding. ASPECTS [facility] Stroke Program Early CT  Score) Total score (0-10 with 10 being normal): 10; occipital pole cytotoxic edema. IMPRESSION: 1. Stable non contrast CT appearance of the brain since 0050 hours today: Occipital pole Cytotoxic edema stable from the MRI at 0242 hours today. No new edema, no intracranial hemorrhage, or mass effect. 2. 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 Electronically Signed   By: Odessa Fleming M.D.   On: 01/31/2023 08:08   MR BRAIN WO CONTRAST  Result Date: 01/31/2023 CLINICAL DATA:  Dizziness and gait instability. Large vessel occlusion. EXAM: MRI HEAD WITHOUT CONTRAST TECHNIQUE: Multiplanar, multiecho pulse sequences of the brain and surrounding structures were obtained without intravenous contrast. COMPARISON:  CTA head neck 01/31/2023 Brain MRI 09/01/2019 FINDINGS: Brain: There are areas of acute/early subacute ischemia within both occipital lobes, left-greater-than-right. These areas correspond to the areas of infarction seen on the earlier noncontrast head CT (suggested to be chronic at that time). There is no acute ischemia of the brainstem, cerebellum or anterior circulation. There is multifocal hyperintense T2-weighted signal within the white matter. Generalized volume loss. There is hyperintense T2-weighted signal that corresponds to both sites of infarction. There is a component of chronic infarct within the superior right occipital lobe (series 11, image 25). There are multiple old cerebellar infarcts. The midline structures are normal. Vascular: Abnormal distal basilar artery flow void  in keeping with known occlusion. Abnormal flow void at the skull base left ICA covers a longer segment than the demonstrated occlusion on the earlier CTA, likely due to inhibited upstream flow. Skull and upper cervical spine: Normal calvarium and skull base. Visualized upper cervical spine and soft tissues are normal. Sinuses/Orbits:No paranasal sinus fluid levels or advanced mucosal thickening. No mastoid or middle  ear effusion. Normal orbits. IMPRESSION: 1. Acute/early subacute infarcts within both occipital lobes, left-greater-than-right. 2. No brainstem, cerebellar or anterior circulation acute ischemia. 3. Abnormal skull base flow voids in keeping with known vascular and left ICA occlusions. 4. Old superior right occipital and bilateral cerebellar infarcts. Electronically Signed   By: Deatra Robinson M.D.   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 There is reconstitution of the basilar tip with patency of both PCAs maintained. The superior cerebellar arteries are occluded at their origin. Please note that this addendum was previously mistakenly associated with the noncontrast head CT for this patient performed on the same day. Electronically Signed   By: Deatra Robinson M.D.   On: 01/31/2023 02:45   Result Date: 01/31/2023 CLINICAL DATA:  Fall with dysarthria and dizziness EXAM: CT ANGIOGRAPHY HEAD AND NECK WITH AND WITHOUT CONTRAST TECHNIQUE: Multidetector CT imaging of the head and neck was performed using the standard protocol during bolus administration of intravenous contrast. Multiplanar CT image reconstructions and MIPs were obtained to evaluate the vascular anatomy. Carotid stenosis measurements (when applicable) are obtained utilizing NASCET criteria, using the distal internal carotid diameter as the denominator. RADIATION DOSE REDUCTION: This exam was performed according to the departmental dose-optimization program which includes automated exposure control, adjustment of the mA and/or kV according to patient size and/or use of iterative reconstruction technique. CONTRAST:  75mL OMNIPAQUE IOHEXOL 350 MG/ML SOLN COMPARISON:  None Available. FINDINGS: CTA NECK FINDINGS SKELETON: No acute abnormality or high grade bony spinal canal  stenosis. OTHER NECK: Normal pharynx, larynx and major salivary glands. No cervical lymphadenopathy. Unremarkable thyroid gland. UPPER CHEST: No pneumothorax or pleural effusion. No nodules or masses. AORTIC ARCH: There is calcific atherosclerosis of the aortic arch. Conventional 3 vessel aortic branching pattern. RIGHT CAROTID SYSTEM: No dissection, occlusion or aneurysm. There is mixed density atherosclerosis extending into the proximal ICA, resulting in less than 50% stenosis. LEFT CAROTID SYSTEM: No dissection, occlusion or aneurysm. There is mixed density atherosclerosis extending into the proximal ICA, resulting in 60% stenosis. VERTEBRAL ARTERIES: Right dominant configuration. Diminutive left vertebral artery with probable short segment occlusion of the V3 segment. Normal right vertebral artery to the skull base. CTA HEAD FINDINGS POSTERIOR CIRCULATION: Right vertebral artery atherosclerosis with moderate stenosis. Normal left V4 segment. No proximal occlusion of the anterior or inferior cerebellar arteries. Basilar artery is normal. Superior cerebellar arteries are normal. Posterior cerebral arteries are normal. ANTERIOR CIRCULATION: The left ICA is occluded at its terminus within occluded length of approximately 9 mm. The right ICA is mildly atherosclerotic without stenosis. Anterior cerebral arteries are normal. Moderate multifocal stenosis of the M1 segment of the right MCA. The left MCA is patent, likely filled by collateral flow along the circle-of-Willis. There is multifocal mild atherosclerotic irregularity without high-grade stenosis. VENOUS SINUSES: As permitted by contrast timing, patent. ANATOMIC VARIANTS: None Review of the MIP images confirms the above findings. IMPRESSION: 1. Emergent large vessel  occlusion of the left ICA at its terminus with occluded length of approximately 9 mm. The left MCA is patent, likely filled by collateral flow along the circle-of-Willis. 2. Moderate multifocal  stenosis of the M1 segment of the right MCA. 3. Moderate right vertebral artery stenosis. 4. Bilateral carotid bifurcation atherosclerosis with 60% stenosis of the proximal left ICA. 5. Diminutive left vertebral artery with probable short segment occlusion of the V3 segment. Aortic Atherosclerosis (ICD10-I70.0). Critical Value/emergent results were called by telephone at the time of interpretation on 01/31/2023 at 1:17 am to provider [facility] , who verbally acknowledged these results. Electronically Signed: By: Deatra Robinson M.D. 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 There is reconstitution of the basilar tip with patency of both PCAs maintained. The superior cerebellar arteries are occluded at their origin. Electronically Signed   By: Deatra Robinson M.D.   On: 01/31/2023 01:30   Result Date: 01/31/2023 CLINICAL DATA:  Code stroke.  Fall with dizziness EXAM: CT HEAD WITHOUT CONTRAST TECHNIQUE: Contiguous axial images were obtained from the base of the skull through the vertex without intravenous contrast. RADIATION DOSE REDUCTION: This exam was performed according to the departmental dose-optimization program which includes automated exposure control, adjustment of the mA and/or kV according to patient size and/or use of iterative reconstruction technique. COMPARISON:  None Available. FINDINGS: Brain: There is no mass, hemorrhage or extra-axial collection. The size and configuration of the ventricles and extra-axial CSF spaces are normal. There are multiple old cerebellar small vessel infarcts. Bilateral PCA territory infarctions also appear chronic. There is mild white matter hypoattenuation. Vascular: Atherosclerosis of the carotid and vertebral arteries at the skull base. Skull: Normal Sinuses/Orbits: No fluid levels or  advanced mucosal thickening of the visualized paranasal sinuses. No mastoid or middle ear effusion. The orbits are normal. ASPECTS [facility] Stroke Program Early CT Score) - Ganglionic level infarction (caudate, lentiform nuclei, internal capsule, insula, M1-M3 cortex): 7 - Supraganglionic infarction (M4-M6 cortex): 3 Total score (0-10 with 10 being normal): 10 IMPRESSION: 1. No acute intracranial abnormality. 2. ASPECTS is 10. 3. 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 4. Bilateral PCA territory infarctions also appear chronic. These results were called by telephone at the time of interpretation on 01/31/2023 at 1:00 am to provider [facility] , who verbally acknowledged these results. Electronically Signed: By: Deatra Robinson M.D. On: 01/31/2023 01:00   CT C-SPINE NO CHARGE  Result Date: 01/31/2023 CLINICAL DATA:  Neck pain, known left ICA occlusion EXAM: CT CERVICAL SPINE WITHOUT CONTRAST TECHNIQUE: Multidetector CT imaging of the cervical spine was performed without intravenous contrast. Multiplanar CT image reconstructions were also generated. RADIATION DOSE REDUCTION: This exam was performed according to the departmental dose-optimization program which includes automated exposure control, adjustment of the mA and/or kV according to patient size and/or use of iterative reconstruction technique. COMPARISON:  None Available. FINDINGS: Alignment: Within normal limits. Skull base and vertebrae: 7 cervical segments are well visualized. Vertebral body height is well maintained. Mild osteophytic change and facet hypertrophic changes noted. No acute fracture or acute facet abnormality is noted. The odontoid is within normal limits. Soft tissues and spinal canal: Surrounding soft tissue structures appear within normal limits. Upper chest: Visualized lung apices are unremarkable with the exception of some scarring in the right apex. Other: None IMPRESSION: Multilevel degenerative change without acute  bony  abnormality. Electronically Signed   By: Alcide Clever M.D.   On: 01/31/2023 01:25     PHYSICAL EXAM  Temp:  [98.7 F (37.1 C)-99.4 F (37.4 C)] 99.1 F (37.3 C) (12/09 0800) Pulse Rate:  [60-93] 74 (12/09 1000) Resp:  [11-23] 14 (12/09 1000) BP: (94-144)/(41-118) 144/68 (12/09 1000) SpO2:  [91 %-100 %] 98 % (12/09 1000) Arterial Line BP: (101-152)/(49-68) 109/55 (12/08 1815) FiO2 (%):  [28 %] 28 % (12/09 0400)  General - Well nourished, well developed, not in acute distress.  Ophthalmologic - fundi not visualized due to noncooperation.  Cardiovascular - Regular rhythm and rate..  Neuro - awake, alert, eyes open, orientated to place, month but incorrect on age and year. No aphasia, paucity of speech, moderate dysarthria, following all simple commands. Able to name and repeat. No gaze palsy, tracking bilaterally, visual field test showed right upper quadrantanopia, PERRL. No facial droop. Tongue midline. Bilateral UEs 4/5, no drift. Bilaterally LEs 3/5 proximal and 4/5 distally, no drift. Sensation symmetrical bilaterally, chronic left UE ataxia and left LE dysmetria, gait not tested.     ASSESSMENT/PLAN Mr. KAMAU EBBEN is a 77 y.o. male with history of hypertension, hyperlipidemia, stroke admitted for multiple falls. No tPA given due to outside window.    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  Left ICA proximal 60% stenosis, right multifocal M1 moderate stenosis, right VA stenosis. MRI left MCA/PCA small to moderate infarct.  Old right PCA infarct. Due to developing expressive aphasia, repeat CT showed no acute change CTP no core infarct, increased TTP at posterior circulation and left MCA territory Status post IR with terminal ICA  stenting and proximal ICA angioplasty with TICI3 reperfusion Post IR CT repeat left PCA progressive infarct. Hyperdense material in the left frontal sulci, which may represent subarachnoid hemorrhage versus contrast staining. MRI repeat Moderate acute infarct in the left occipital lobe with mild progression from yesterday. Interval punctate acute infarct in the left frontal white matter. 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 2D Echo EF 60 to 65% LDL 167 HgbA1c 5.1 UDS negative Lovenox for VTE prophylaxis No antithrombotic prior to admission, now on aspirin 81 mg daily and clopidogrel 75 mg daily for intracranial stenting Ongoing aggressive stroke risk factor management Therapy recommendations: CIR Disposition: Pending  Bilateral occlusion Likely chronic given no core infarct on CT perfusion and no posterior circulation infarct on MRI. Bilateral P-comm and PCA patent Avoid low BP No intervention needed at this time, discussed with Dr. Corliss Skains  History of stroke 08/2019 admitted for dizziness, imbalance and leaning towards to the left.  CT showed left cerebellar infarct which confirmed on MRI.  MRI showed left SCA occlusion, left VA hypoplastic.  2D echo unremarkable.  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  Discharged on DAPT and Lipitor. Follow-up with Dr. Pearlean Brownie at [facility]  Hypertension Stable now S/p albumin Avoid low BP Long term BP goal normotensive  Hyperlipidemia Home meds: Zetia LDL 167, goal < 70 Statin intolerance (Lipitor and pravastatin) Continue Zetia at discharge Will need to consider Leqvio  Other Stroke Risk Factors Advanced age Former smoker  Other Active Problems AKI, creatinine 1.40--1.06  Hospital day # 1  This patient is critically  ill due to left ICA occlusion, left MCA stroke, status post intracranial stenting and at significant risk of neurological worsening, death form recurrent stroke, hemorrhagic  transformation. This patient's care requires constant monitoring of vital signs, hemodynamics, respiratory and cardiac monitoring, review of multiple databases, neurological assessment, discussion with family, other specialists and medical decision making of high complexity. I spent 30 minutes of neurocritical care time in the care of this patient.   Marvel Plan, MD PhD Stroke Neurology 02/01/2023 11:34 AM    To contact Stroke Continuity provider, please refer to [email or web address]. After hours, contact General Neurology

## 2023-02-01 NOTE — Progress Notes (Signed)
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 He states he feels ok. Able to state year is 2024 and that he is at [facility]. Able to recognize "pen" but cannot state what it does, unable to name "watch" or what it does which RN said about an hour ago he was able to do, unable to name "finger" but able to state how many fingers we are holding up. He states "I know what it is but I can't say it" and "I'm not sure I know what it is" several times when asked about these objects. Received ASA + Plavix earlier this morning.  Allergies: Lisinopril, Atorvastatin, and Pravastatin  Medications: Prior to Admission medications   Medication Sig Start Date End Date Taking? Authorizing Provider  amLODipine (NORVASC) 10 MG tablet Take 1 tablet (10 mg total) by mouth daily. 07/10/22  Yes Gabriel Earing, FNP  B Complex-C (B-COMPLEX WITH VITAMIN C) tablet Take 1 tablet by mouth daily. 05/29/21  Yes Deliah Boston F, FNP  MAGNESIUM PO Take 1 tablet by mouth in the morning and at bedtime.   Yes [provider]  Omega-3 Fatty Acids (FISH OIL PO) Take 1 capsule by mouth daily.   Yes [provider]  ezetimibe (ZETIA) 10 MG tablet Take 10 mg by mouth daily. Patient not taking: Reported on 01/31/2023 12/15/22   [provider]     Vital Signs: BP (!) 152/78 (BP Location: Right Arm)   Pulse 92   Temp 99.3 F (37.4 C) (Oral)   Resp 18   Ht 5\' 7"  (1.702 m)   Wt 119 lb 4.3 oz (54.1 kg)   SpO2 95%   BMI 18.68 kg/m   Physical Exam Vitals and nursing note reviewed.  Constitutional:      General: He is not in acute distress. HENT:     Head: Normocephalic.  Cardiovascular:     Rate and Rhythm: Normal rate.     Comments: (+) Right CFA  puncture site clean, dry, dressed appropriately. Soft, non tender, non pulsatile. No bleeding or significant bruising. Pulmonary:     Effort: Pulmonary effort is normal.  Skin:    General: Skin is warm and dry.  Neurological:     Mental Status: He is alert.   Alert, awake, and oriented x 3 Speech somewhat garbled at times and comprehension in tact   Imaging: ECHOCARDIOGRAM COMPLETE  Result Date: 02/01/2023    ECHOCARDIOGRAM REPORT   Patient Name:   Grant Berry Date of Exam: 02/01/2023 Medical Rec #:  [id number]          Height:       67.0 in Accession #:    [id number]         Weight:       119.3 lb Date of Birth:  [birth year]          BSA:          1.623 m Patient Age:    77 years           BP:           172/87 mmHg Patient Gender: M                  HR:  92 bpm. Exam Location:  Inpatient Procedure: 2D Echo, Cardiac Doppler and Color Doppler Indications:    Stroke  History:        Patient has prior history of Echocardiogram examinations, most                 recent 09/02/2019. Stroke; Risk Factors:Hypertension and                 Dyslipidemia.  Sonographer:    Milda Smart Referring Phys: [id number] [facility]  Sonographer Comments: Suboptimal parasternal window. Image acquisition challenging due to patient body habitus and Image acquisition challenging due to respiratory motion. IMPRESSIONS  1. Left ventricular ejection fraction, by estimation, is 60 to 65%. The left ventricle has normal function. The left ventricle has no regional wall motion abnormalities. Left ventricular diastolic parameters are consistent with Grade I diastolic dysfunction (impaired relaxation).  2. Right ventricular systolic function is normal. The right ventricular size is normal.  3. The mitral valve is abnormal. No evidence of mitral valve regurgitation. No evidence of mitral stenosis.  4. The aortic valve was not well visualized. There is mild calcification of the aortic valve. There is mild thickening of  the aortic valve. Aortic valve regurgitation is not visualized. Aortic valve sclerosis is present, with no evidence of aortic valve  stenosis.  5. The inferior vena cava is normal in size with greater than 50% respiratory variability, suggesting right atrial pressure of 3 mmHg. FINDINGS  Left Ventricle: Left ventricular ejection fraction, by estimation, is 60 to 65%. The left ventricle has normal function. The left ventricle has no regional wall motion abnormalities. The left ventricular internal cavity size was normal in size. There is  no left ventricular hypertrophy. Left ventricular diastolic parameters are consistent with Grade I diastolic dysfunction (impaired relaxation). Right Ventricle: The right ventricular size is normal. No increase in right ventricular wall thickness. Right ventricular systolic function is normal. Left Atrium: Left atrial size was normal in size. Right Atrium: Right atrial size was normal in size. Pericardium: There is no evidence of pericardial effusion. Mitral Valve: The mitral valve is abnormal. There is mild thickening of the mitral valve leaflet(s). There is mild calcification of the mitral valve leaflet(s). Mild mitral annular calcification. No evidence of mitral valve regurgitation. No evidence of mitral valve stenosis. MV peak gradient, 5.8 mmHg. The mean mitral valve gradient is 3.0 mmHg. Tricuspid Valve: The tricuspid valve is normal in structure. Tricuspid valve regurgitation is not demonstrated. No evidence of tricuspid stenosis. Aortic Valve: The aortic valve was not well visualized. There is mild calcification of the aortic valve. There is mild thickening of the aortic valve. Aortic valve regurgitation is not visualized. Aortic valve sclerosis is present, with no evidence of aortic valve stenosis. Pulmonic Valve: The pulmonic valve was normal in structure. Pulmonic valve regurgitation is not visualized. No evidence of pulmonic stenosis. Aorta: The aortic root is normal in  size and structure. Venous: The inferior vena cava is normal in size with greater than 50% respiratory variability, suggesting right atrial pressure of 3 mmHg. IAS/Shunts: No atrial level shunt detected by color flow Doppler.  LEFT VENTRICLE PLAX 2D LVIDd:         4.40 cm   Diastology LVIDs:         3.20 cm   LV e' medial:    6.20 cm/s LV PW:         0.80 cm   LV E/e' medial:  13.4 LV IVS:  0.60 cm   LV e' lateral:   7.94 cm/s LVOT diam:     2.50 cm   LV E/e' lateral: 10.5 LVOT Area:     4.91 cm  RIGHT VENTRICLE             IVC RV S prime:     17.80 cm/s  IVC diam: 1.40 cm TAPSE (M-mode): 2.5 cm LEFT ATRIUM             Index        RIGHT ATRIUM           Index LA diam:        3.30 cm 2.03 cm/m   RA Area:     11.70 cm LA Vol (A2C):   19.1 ml 11.77 ml/m  RA Volume:   26.80 ml  16.51 ml/m LA Vol (A4C):   27.2 ml 16.76 ml/m LA Biplane Vol: 23.8 ml 14.66 ml/m   AORTA Ao Root diam: 3.60 cm Ao Asc diam:  3.40 cm MITRAL VALVE MV Area (PHT): 1.73 cm     SHUNTS MV Peak grad:  5.8 mmHg     Systemic Diam: 2.50 cm MV Mean grad:  3.0 mmHg MV Vmax:       1.20 m/s MV Vmean:      83.8 cm/s MV Decel Time: 438 msec MV E velocity: 83.30 cm/s MV A velocity: 108.00 cm/s MV E/A ratio:  0.77 Charlton Haws MD Electronically signed by Charlton Haws MD Signature Date/Time: 02/01/2023/3:01:07 PM    Final    MR BRAIN WO CONTRAST  Result Date: 02/01/2023 CLINICAL DATA:  Stroke and embolectomy. EXAM: MRI HEAD WITHOUT CONTRAST TECHNIQUE: Multiplanar, multiecho pulse sequences of the brain and surrounding structures were obtained without intravenous contrast. COMPARISON:  Head CT and MRI from yesterday FINDINGS: Brain: Moderate acute infarct in the left occipital cortex, increased from yesterday. Punctate acute infarct in the superior left frontal white matter since prior. There are areas of low-grade restricted diffusion in the left periatrial white matter and right occipital pole attributed to subacute ischemia. Subarachnoid  hemorrhage along the anterior left frontal convexity, distinct from any areas of acute infarction and known from prior head CT. Pre-existing bilateral cerebellar and brainstem small infarcts. Chronic lacunar infarcts in theright caudate. Ischemic gliosis is mild. 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 Generalized brain atrophy. Vascular: No flow seen in the left vertebral artery. There is a basilar flow void. Skull and upper cervical spine: No focal marrow lesion Sinuses/Orbits: No acute finding IMPRESSION: 1. Moderate acute infarct in the left occipital lobe with mild progression from yesterday. Interval punctate acute infarct in the left frontal white matter. 2. Areas of chronic and subacute ischemia preferentially affecting the posterior circulation. 3. Known subarachnoid hemorrhage at the anterior left frontal convexity, stable from preceding head CT. Electronically Signed   By: Tiburcio Pea M.D.   On: 02/01/2023 04:58   CT HEAD WO CONTRAST ( )  Result Date: 01/31/2023 CLINICAL DATA:  Stroke, follow-up; post canrelor infusion EXAM: CT HEAD WITHOUT CONTRAST TECHNIQUE: Contiguous axial images were obtained from the base of the skull through the vertex without intravenous contrast. RADIATION DOSE REDUCTION: This exam was performed according to the departmental dose-optimization program which includes automated exposure control, adjustment of the mA and/or kV according to patient size and/or use of iterative reconstruction technique. COMPARISON:  CT head 10/01/2022 and intra procedural CT 10/01/2022 FINDINGS: Brain: Hyperdense material in the left frontal sulci (series 5, image  47 and 60 and series 3, images 15-18 and 25 area and series 3, image 25), which may represent subarachnoid hemorrhage versus contrast staining. This was not apparent on the immediate postprocedural CT head. Redemonstrated cytotoxic edema in the in the left occipital lobe (series 3, image  14), which is slightly more prominent than on the prior exam. More heterogeneous hypodensity in the right occipital lobe, which is favored to be chronic remote infarcts in the pons and bilateral cerebellar hemispheres. No evidence of acute infarction, hemorrhage, mass, mass effect, or midline shift. No hydrocephalus or extra-axial fluid collection. Vascular: No hyperdense vessel. Atherosclerotic calcifications in the intracranial carotid and vertebral arteries. Skull: Negative for fracture or focal lesion. Sinuses/Orbits: Mucosal thickening in the ethmoid air cells. No acute finding in the orbits. Status post bilateral lens replacements. Other: The mastoid air cells are well aerated. IMPRESSION: 1. Hyperdense material in the left frontal sulci, which may represent subarachnoid hemorrhage versus contrast staining. This was not apparent on the immediate postprocedural CT head. Recommend short-term follow-up head CT. 2. Redemonstrated cytotoxic edema in the left occipital lobe, which is slightly more prominent than on the prior exam. These results will be called to the ordering clinician or representative by the Radiologist Assistant, and communication documented in the PACS or [employer]. Electronically Signed   By: Wiliam Ke M.D.   On: 01/31/2023 20:52   CT CEREBRAL PERFUSION W CONTRAST  Result Date: 01/31/2023 CLINICAL DATA:  Code stroke. 77 year old male. CTA yesterday demonstrating left ICA terminus, distal left vertebral artery, Basilar artery occlusions. EXAM: CT PERFUSION BRAIN TECHNIQUE: Multiphase CT imaging of the brain was performed following IV bolus contrast injection. Subsequent parametric perfusion maps were calculated using RAPID software. RADIATION DOSE REDUCTION: This exam was performed according to the departmental dose-optimization program which includes automated exposure control, adjustment of the mA and/or kV according to patient size and/or use of iterative reconstruction  technique. CONTRAST:  40mL OMNIPAQUE IOHEXOL 350 MG/ML SOLN COMPARISON:  Head CTs, CTA head and neck, and brain MRI earlier today. FINDINGS: CT Brain Perfusion Findings: CBF (<30%) Volume: 0mL. No CBF or CBV parameter abnormality is detected. Perfusion (Tmax>6.0s) volume: . Fairly symmetric abnormal T-max throughout the bilateral cerebellum, posterior cerebral hemispheres. T-max > 4 seconds is more pronounced in the left cerebral hemisphere including the left MCA territory. There is volume of minimal T-max > 10 seconds in the right posterior circulation. Mismatch Volume: Infarct Core: No infarct core is detected Infarction Location:Oligemia throughout the posterior circulation, to a lesser extent the left MCA territory. IMPRESSION: Oligemia throughout the bilateral posterior circulation, including the posterior fossa. Less pronounced oligemia in the Left MCA territory. No infarct core, CBF or CBV parameter abnormality detected by CTP. These results were communicated to Dr. Roda Shutters at 8:12 am on 01/31/2023 by text page via the [facility] messaging system. Electronically Signed   By: Odessa Fleming M.D.   On: 01/31/2023 08:12   CT HEAD CODE STROKE WO CONTRAST  Result Date: 01/31/2023 CLINICAL DATA:  Code stroke. 77 year old male. CTA yesterday demonstrating left ICA terminus, distal left vertebral artery, Basilar artery occlusions. EXAM: CT HEAD WITHOUT CONTRAST TECHNIQUE: Contiguous axial images were obtained from the base of the skull through the vertex without intravenous contrast. RADIATION DOSE REDUCTION: This exam was performed according to the departmental dose-optimization program which includes automated exposure control, adjustment of the mA and/or kV according to patient size and/or use of iterative reconstruction technique. COMPARISON:  Brain MRI 0244 hours today. CTA head and  neck 0056 hours today. Plain head CT 0050 hours today. FINDINGS: Brain: Left occipital pole cytotoxic edema is slightly more  conspicuous on CT now. Heterogeneous right occipital pole redemonstrated. Chronic left central pontine and bilateral cerebellar infarcts on MRI appears stable by CT. No acute intracranial hemorrhage identified. No new cytotoxic edema compared to the earlier MRI. No midline shift, mass effect, or evidence of intracranial mass lesion. No ventriculomegaly. Vascular: Extensive Calcified atherosclerosis at the skull base. See also CTA earlier today. Skull: Stable and intact. Sinuses/Orbits: Visualized paranasal sinuses and mastoids are clear. Other: No acute orbit or scalp soft tissue finding. ASPECTS [facility] Stroke Program Early CT Score) Total score (0-10 with 10 being normal): 10; occipital pole cytotoxic edema. IMPRESSION: 1. Stable non contrast CT appearance of the brain since 0050 hours today: Occipital pole Cytotoxic edema stable from the MRI at 0242 hours today. No new edema, no intracranial hemorrhage, or mass effect. 2. 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 Electronically Signed   By: Odessa Fleming M.D.   On: 01/31/2023 08:08   MR BRAIN WO CONTRAST  Result Date: 01/31/2023 CLINICAL DATA:  Dizziness and gait instability. Large vessel occlusion. EXAM: MRI HEAD WITHOUT CONTRAST TECHNIQUE: Multiplanar, multiecho pulse sequences of the brain and surrounding structures were obtained without intravenous contrast. COMPARISON:  CTA head neck 01/31/2023 Brain MRI 09/01/2019 FINDINGS: Brain: There are areas of acute/early subacute ischemia within both occipital lobes, left-greater-than-right. These areas correspond to the areas of infarction seen on the earlier noncontrast head CT (suggested to be chronic at that time). There is no acute ischemia of the brainstem, cerebellum or anterior circulation. There is multifocal hyperintense T2-weighted signal within the white matter. Generalized volume loss. There is hyperintense T2-weighted signal that corresponds to both sites of infarction. There is a  component of chronic infarct within the superior right occipital lobe (series 11, image 25). There are multiple old cerebellar infarcts. The midline structures are normal. Vascular: Abnormal distal basilar artery flow void in keeping with known occlusion. Abnormal flow void at the skull base left ICA covers a longer segment than the demonstrated occlusion on the earlier CTA, likely due to inhibited upstream flow. Skull and upper cervical spine: Normal calvarium and skull base. Visualized upper cervical spine and soft tissues are normal. Sinuses/Orbits:No paranasal sinus fluid levels or advanced mucosal thickening. No mastoid or middle ear effusion. Normal orbits. IMPRESSION: 1. Acute/early subacute infarcts within both occipital lobes, left-greater-than-right. 2. No brainstem, cerebellar or anterior circulation acute ischemia. 3. Abnormal skull base flow voids in keeping with known vascular and left ICA occlusions. 4. Old superior right occipital and bilateral cerebellar infarcts. Electronically Signed   By: Deatra Robinson M.D.   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 There is reconstitution of the basilar tip with patency of both PCAs maintained. The superior cerebellar arteries are occluded at their origin. Please note that this addendum was previously mistakenly associated with the noncontrast head CT for this patient performed on the same day. Electronically Signed   By: Deatra Robinson M.D.   On: 01/31/2023 02:45   Result Date: 01/31/2023 CLINICAL DATA:  Fall with dysarthria and dizziness EXAM: CT ANGIOGRAPHY HEAD AND NECK WITH AND WITHOUT CONTRAST TECHNIQUE: Multidetector CT imaging of the head and neck was performed using the standard protocol during bolus  administration of intravenous contrast. Multiplanar CT image  reconstructions and MIPs were obtained to evaluate the vascular anatomy. Carotid stenosis measurements (when applicable) are obtained utilizing NASCET criteria, using the distal internal carotid diameter as the denominator. RADIATION DOSE REDUCTION: This exam was performed according to the departmental dose-optimization program which includes automated exposure control, adjustment of the mA and/or kV according to patient size and/or use of iterative reconstruction technique. CONTRAST:  75mL OMNIPAQUE IOHEXOL 350 MG/ML SOLN COMPARISON:  None Available. FINDINGS: CTA NECK FINDINGS SKELETON: No acute abnormality or high grade bony spinal canal stenosis. OTHER NECK: Normal pharynx, larynx and major salivary glands. No cervical lymphadenopathy. Unremarkable thyroid gland. UPPER CHEST: No pneumothorax or pleural effusion. No nodules or masses. AORTIC ARCH: There is calcific atherosclerosis of the aortic arch. Conventional 3 vessel aortic branching pattern. RIGHT CAROTID SYSTEM: No dissection, occlusion or aneurysm. There is mixed density atherosclerosis extending into the proximal ICA, resulting in less than 50% stenosis. LEFT CAROTID SYSTEM: No dissection, occlusion or aneurysm. There is mixed density atherosclerosis extending into the proximal ICA, resulting in 60% stenosis. VERTEBRAL ARTERIES: Right dominant configuration. Diminutive left vertebral artery with probable short segment occlusion of the V3 segment. Normal right vertebral artery to the skull base. CTA HEAD FINDINGS POSTERIOR CIRCULATION: Right vertebral artery atherosclerosis with moderate stenosis. Normal left V4 segment. No proximal occlusion of the anterior or inferior cerebellar arteries. Basilar artery is normal. Superior cerebellar arteries are normal. Posterior cerebral arteries are normal. ANTERIOR CIRCULATION: The left ICA is occluded at its terminus within occluded length of approximately 9 mm. The right ICA is mildly atherosclerotic without  stenosis. Anterior cerebral arteries are normal. Moderate multifocal stenosis of the M1 segment of the right MCA. The left MCA is patent, likely filled by collateral flow along the circle-of-Willis. There is multifocal mild atherosclerotic irregularity without high-grade stenosis. VENOUS SINUSES: As permitted by contrast timing, patent. ANATOMIC VARIANTS: None Review of the MIP images confirms the above findings. IMPRESSION: 1. 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 The left MCA is patent, likely filled by collateral flow along the circle-of-Willis. 2. Moderate multifocal stenosis of the M1 segment of the right MCA. 3. Moderate right vertebral artery stenosis. 4. Bilateral carotid bifurcation atherosclerosis with 60% stenosis of the proximal left ICA. 5. Diminutive left vertebral artery with probable short segment occlusion of the V3 segment. Aortic Atherosclerosis (ICD10-I70.0). Critical Value/emergent results were called by telephone at the time of interpretation on 01/31/2023 at 1:17 am to provider [facility] , who verbally acknowledged these results. Electronically Signed: By: Deatra Robinson M.D. 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 There is reconstitution of the basilar tip with patency of both PCAs maintained. The superior cerebellar arteries are occluded at their origin. Electronically Signed   By: Deatra Robinson M.D.   On: 01/31/2023 01:30   Result Date: 01/31/2023 CLINICAL DATA:  Code stroke.  Fall with dizziness EXAM: CT HEAD WITHOUT CONTRAST TECHNIQUE: Contiguous axial images were obtained from the base of the skull through the vertex without intravenous contrast. RADIATION DOSE REDUCTION: This exam was performed according to the departmental  dose-optimization program which includes automated exposure control, adjustment of the mA and/or kV according to patient size and/or use of iterative reconstruction technique. COMPARISON:  None Available. FINDINGS:  Brain: There is no mass, hemorrhage or extra-axial collection. The size and configuration of the ventricles and extra-axial CSF spaces are normal. There are multiple old cerebellar small vessel infarcts. Bilateral PCA territory infarctions also appear chronic. There is mild white matter hypoattenuation. Vascular: Atherosclerosis of the carotid and vertebral arteries at the skull base. Skull: Normal Sinuses/Orbits: No fluid levels or advanced mucosal thickening of the visualized paranasal sinuses. No mastoid or middle ear effusion. The orbits are normal. ASPECTS [facility] Stroke Program Early CT Score) - Ganglionic level infarction (caudate, lentiform nuclei, internal capsule, insula, M1-M3 cortex): 7 - Supraganglionic infarction (M4-M6 cortex): 3 Total score (0-10 with 10 being normal): 10 IMPRESSION: 1. No acute intracranial abnormality. 2. ASPECTS is 10. 3. 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 4. Bilateral PCA territory infarctions also appear chronic. These results were called by telephone at the time of interpretation on 01/31/2023 at 1:00 am to provider [facility] , who verbally acknowledged these results. Electronically Signed: By: Deatra Robinson M.D. On: 01/31/2023 01:00   CT C-SPINE NO CHARGE  Result Date: 01/31/2023 CLINICAL DATA:  Neck pain, known left ICA occlusion EXAM: CT CERVICAL SPINE WITHOUT CONTRAST TECHNIQUE: Multidetector CT imaging of the cervical spine was performed without intravenous contrast. Multiplanar CT image reconstructions were also generated. RADIATION DOSE REDUCTION: This exam was performed according to the departmental dose-optimization program which includes automated exposure control, adjustment of the mA and/or kV according to patient size and/or use of  iterative reconstruction technique. COMPARISON:  None Available. FINDINGS: Alignment: Within normal limits. Skull base and vertebrae: 7 cervical segments are well visualized. Vertebral body height is well maintained. Mild osteophytic change and facet hypertrophic changes noted. No acute fracture or acute facet abnormality is noted. The odontoid is within normal limits. Soft tissues and spinal canal: Surrounding soft tissue structures appear within normal limits. Upper chest: Visualized lung apices are unremarkable with the exception of some scarring in the right apex. Other: None IMPRESSION: Multilevel degenerative change without acute bony abnormality. Electronically Signed   By: Alcide Clever M.D.   On: 01/31/2023 01:25    Labs:  CBC: Recent Labs    07/10/22 1012 01/31/23 0040 01/31/23 0042 01/31/23 0454 02/01/23 0444  WBC 6.0 6.7  --  6.4 6.3  HGB 13.9 12.5* 12.9* 12.9* 9.3*  HCT 42.3 37.6* 38.0* 38.9* 29.1*  PLT 220 192  --  196 153    COAGS: Recent Labs    01/31/23 0040  INR 1.0  APTT 26    BMP: Recent Labs    07/10/22 1012 01/31/23 0040 01/31/23 0042 02/01/23 0444  NA 140 139 139 137  K 3.8 3.5 3.6 3.7  CL 100 104 102 105  CO2 27 24  --  23  GLUCOSE 90 102* 102* 80  BUN 14 27* 27* 9  CALCIUM 9.6 9.7  --  8.3*  CREATININE 1.13 1.33* 1.40* 1.06  GFRNONAA  --  55*  --  >60    LIVER FUNCTION TESTS: Recent Labs    07/10/22 1012 01/31/23 0040  BILITOT 0.4 0.5  AST 20 27  ALT 12 15  ALKPHOS 57 55  PROT 6.8 7.2  ALBUMIN 4.3 4.0    Assessment and Plan:  77 y/o M s/p left ICA thrombectomy and stent assisted angioplasty 01/31/23 in [facility] (Dr. Corliss Skains) seen today for follow up. Patient alerted and oriented x 3, having difficulty naming simple objects and their uses today - per RN was able to name watch and tell it's purpose  earlier. Otherwise neuro exam stable. Right CFA puncture site unremarkable.  Plan: - Continue ASA + Plavix  - [facility] will continue to follow,  discharge plans per primary team  Please call with questions or concerns.  Electronically Signed: Villa Herb, PA-C 02/01/2023, 4:23 PM   I spent a total of 15 Minutes at the the patient's bedside AND on the patient's hospital floor or unit, greater than 50% of which was counseling/coordinating care for left ICA thrombectomy.

## 2023-02-01 NOTE — Progress Notes (Signed)
  Echocardiogram 2D Echocardiogram has been performed.  Grant Berry 02/01/2023, 2:41 PM

## 2023-02-01 NOTE — Evaluation (Signed)
Physical Therapy Evaluation Patient Details Name: Grant Berry MRN: [id number] DOB: 1945/07/08 Today's Date: 02/01/2023  History of Present Illness  Pt is a 77 y.o. male who presented 01/31/23 s/p multiple falls.  MRI showed left MCA/PCA infarct, chronic right PCA infarct.  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 S/p bilat common carotid arteriograms R CFA approach, complete revascularization of L ICA supraclinoid segment, treated intracranial arteriosclerotic related stenosis, angioplasty of high grade L ICA proximal stenosis 12/8. PMH: L MCA CVA, HLD, HTN  Clinical Impression   Pt presents with impaired coordination, generalized weakness, impaired balance with history of recent falls, impaired activity tolerance, and inability to progress to gait given weakness. Pt to benefit from acute PT to address deficits. Pt requiring mod assist for stand and pivot x3 OOB, pt limited by weakness and fatigue. Pt also complaining of visual deficits, visual tracking tasks demonstrating overshooting and states his vision is blurry but further testing and assessment limited by cognition. Further assessment of vision needed. PT to progress mobility as tolerated, and will continue to follow acutely.          If plan is discharge home, recommend the following: A lot of help with walking and/or transfers;A lot of help with bathing/dressing/bathroom   Can travel by private vehicle        Equipment Recommendations Other (comment) (tbd)  Recommendations for Other Services       Functional Status Assessment Patient has had a recent decline in their functional status and demonstrates the ability to make significant improvements in function in a reasonable and predictable amount of time.     Precautions / Restrictions Precautions Precautions: Fall Restrictions Weight Bearing Restrictions: No      Mobility  Bed  Mobility Overal bed mobility: Needs Assistance Bed Mobility: Supine to Sit     Supine to sit: Min assist     General bed mobility comments: assist for trunk elevation, scooting to EOB    Transfers Overall transfer level: Needs assistance Equipment used: Rolling walker (2 wheels), 1 person hand held assist Transfers: Sit to/from Stand, Bed to chair/wheelchair/BSC Sit to Stand: Mod assist Stand pivot transfers: Mod assist         General transfer comment: assist for power up, rise, steady, and pivot to L and R. Stand x3, first attempt with RW but benefitted from [facility] instead. LEs blocked for pt safety, intermittent buckling especially on first attempt.    Ambulation/Gait               General Gait Details: unable - attempted steps away from bed and pt stopped after 1 step stating he was "too weak"  Stairs            Wheelchair Mobility     Tilt Bed    Modified Rankin (Stroke Patients Only) Modified Rankin (Stroke Patients Only) Pre-Morbid Rankin Score: No significant disability Modified Rankin: Moderately severe disability     Balance Overall balance assessment: Needs assistance, History of Falls Sitting-balance support: No upper extremity supported, Feet supported Sitting balance-Leahy Scale: Fair     Standing balance support: Bilateral upper extremity supported, During functional activity Standing balance-Leahy Scale: Poor Standing balance comment: reliant on external support                             Pertinent Vitals/Pain Pain Assessment Pain Assessment: Faces Faces Pain  Scale: Hurts little more Pain Location: wrists bilat Pain Descriptors / Indicators: Dull, Grimacing, Guarding Pain Intervention(s): Limited activity within patient's tolerance, Monitored during session, Repositioned    Home Living Family/patient expects to be discharged to:: Private residence Living Arrangements: Children (son lives with him, works 2nd shift at a  nursing home) Available Help at Discharge: Family Type of Home: House Home Access: Stairs to enter Entrance Stairs-Rails: Right;Left;Can reach both Entrance Stairs-Number of Steps: 3 in back, 4 in front   Home Layout: One level Home Equipment: Agricultural consultant (2 wheels);Rollator (4 wheels)      Prior Function Prior Level of Function : Independent/Modified Independent;History of Falls (last six months);Patient poor historian/Family not available             Mobility Comments: pt endorses using a RW for "4 years, since all of this started". Pt states he has only had falls related to CVA this admission, unsure of pt accuracy ADLs Comments: pt endorses independence     Extremity/Trunk Assessment   Upper Extremity Assessment Upper Extremity Assessment: Defer to OT evaluation    Lower Extremity Assessment Lower Extremity Assessment: Generalized weakness (incoordination R>L LE, strength symmetric)    Cervical / Trunk Assessment Cervical / Trunk Assessment: Normal  Communication   Communication Communication: Difficulty communicating thoughts/reduced clarity of speech Cueing Techniques: Gestural cues;Verbal cues  Cognition Arousal: Alert Behavior During Therapy: Flat affect Overall Cognitive Status: Impaired/Different from baseline Area of Impairment: Orientation, Memory, Following commands, Safety/judgement, Problem solving                 Orientation Level: Disoriented to, Time     Following Commands: Follows one step commands with increased time Safety/Judgement: Decreased awareness of safety, Decreased awareness of deficits   Problem Solving: Slow processing, Decreased initiation, Difficulty sequencing, Requires verbal cues, Requires tactile cues General Comments: Pt oriented to self, location, states year is 2004, and states he had a cva. Pt is a questionable historian, pt stating he typically does not fall but no family member at bedside and pt with multiple  bruises body-wide at different stages of healing. Makes contradictory statements "my vision is better now" vs "my vision is blurry";  "I can see better (with L eye covered) vs "it's about the same". Pt with difficulty articulating what the names of simple objects are, watch and pen for example.        General Comments General comments (skin integrity, edema, etc.): bruising on bilat knees, R shoulder, UEs    Exercises     Assessment/Plan    PT Assessment Patient needs continued PT services  PT Problem List Decreased strength;Decreased balance;Decreased activity tolerance;Decreased coordination;Decreased cognition;Decreased knowledge of precautions;Decreased safety awareness;Cardiopulmonary status limiting activity;Decreased mobility       PT Treatment Interventions DME instruction;Therapeutic activities;Gait training;Therapeutic exercise;Patient/family education;Balance training;Stair training;Neuromuscular re-education;Functional mobility training    PT Goals (Current goals can be found in the Care Plan section)  Acute Rehab PT Goals PT Goal Formulation: With patient Time For Goal Achievement: 02/15/23 Potential to Achieve Goals: Good    Frequency Min 1X/week     Co-evaluation               AM-PAC PT "6 Clicks" Mobility  Outcome Measure Help needed turning from your back to your side while in a flat bed without using bedrails?: A Little Help needed moving from lying on your back to sitting on the side of a flat bed without using bedrails?: A Little Help needed moving to and  from a bed to a chair (including a wheelchair)?: A Lot Help needed standing up from a chair using your arms (e.g., wheelchair or bedside chair)?: A Lot Help needed to walk in hospital room?: Total Help needed climbing 3-5 steps with a railing? : Total 6 Click Score: 12    End of Session Equipment Utilized During Treatment: Gait belt Activity Tolerance: Patient limited by fatigue Patient left: in  bed;with call bell/phone within reach;with nursing/sitter in room;Other (comment) (pt being transported off floor to a new dept by RN) Nurse Communication: Mobility status PT Visit Diagnosis: Other abnormalities of gait and mobility (R26.89);Muscle weakness (generalized) (M62.81)    Time: 6045-4098 PT Time Calculation (min) (ACUTE ONLY): 20 min   Charges:   PT Evaluation $PT Eval Low Complexity: 1 Low   PT General Charges $$ ACUTE PT VISIT: 1 Visit         Marye Round, PT DPT Acute Rehabilitation Services Secure Chat Preferred  Office [phone]   Zamere Pasternak E Christain [place] 02/01/2023, 3:41 PM

## 2023-02-01 NOTE — H&P (Signed)
Please see my consult note from 01/31/23 for H and P.  Erick Blinks [facility]

## 2023-02-02 DIAGNOSIS — I63432 Cerebral infarction due to embolism of left posterior cerebral artery: Secondary | ICD-10-CM

## 2023-02-02 LAB — CBC
HCT: 32.5 % — ABNORMAL LOW (ref 39.0–52.0)
Hemoglobin: 11 g/dL — ABNORMAL LOW (ref 13.0–17.0)
MCH: 29.9 pg (ref 26.0–34.0)
MCHC: 33.8 g/dL (ref 30.0–36.0)
MCV: 88.3 fL (ref 80.0–100.0)
Platelets: 165 10*3/uL (ref 150–400)
RBC: 3.68 MIL/uL — ABNORMAL LOW (ref 4.22–5.81)
RDW: 14.3 % (ref 11.5–15.5)
WBC: 8.4 10*3/uL (ref 4.0–10.5)
nRBC: 0 % (ref 0.0–0.2)

## 2023-02-02 LAB — BASIC METABOLIC PANEL
Anion gap: 11 (ref 5–15)
BUN: 14 mg/dL (ref 8–23)
CO2: 24 mmol/L (ref 22–32)
Calcium: 9.4 mg/dL (ref 8.9–10.3)
Chloride: 101 mmol/L (ref 98–111)
Creatinine, Ser: 1.07 mg/dL (ref 0.61–1.24)
GFR, Estimated: >60 mL/min (ref >60)
Glucose, Bld: 101 mg/dL — ABNORMAL HIGH (ref 70–99)
Potassium: 3.4 mmol/L — ABNORMAL LOW (ref 3.5–5.1)
Sodium: 136 mmol/L (ref 135–145)

## 2023-02-02 MED ORDER — QUETIAPINE FUMARATE 25 MG PO TABS
25.0000 mg | ORAL_TABLET | Freq: Two times a day (BID) | ORAL | Status: DC
  Administered 2023-02-02 – 2023-02-04 (×5): 25 mg via ORAL
  Filled 2023-02-02 (×5): qty 1

## 2023-02-02 NOTE — Progress Notes (Signed)
Patient  noted to have moderate amount of blood around his perineal area and foley. Looks like patient tried to pull out his foley catheter because  when I went to his room  safety mittens were off. Foley care done and MD paged for tele sitter orders.

## 2023-02-02 NOTE — Progress Notes (Signed)
Physical Therapy Treatment Patient Details Name: Grant Berry MRN: [id number] DOB: May 17, 1945 Today's Date: 02/02/2023   History of Present Illness Pt is a 77 y.o. male who presented 01/31/23 s/p multiple falls.  MRI showed left MCA/PCA infarct, chronic right PCA infarct.  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 S/p bilat common carotid arteriograms R CFA approach, complete revascularization of L ICA supraclinoid segment, treated intracranial arteriosclerotic related stenosis, angioplasty of high grade L ICA proximal stenosis 12/8. PMH: L MCA CVA, HLD, HTN    PT Comments  Patient indicating pain all over, but especially Lt upper trap area. Agreed to mobility. Patient highly distracted by his pain. Able to progress to short distance ambulation (5 ft) with RW and mod assist with +2 for chair follow. RN made aware that pt could benefit from pain medication.     If plan is discharge home, recommend the following: A lot of help with bathing/dressing/bathroom;Two people to help with walking and/or transfers;Assistance with cooking/housework;Assistance with feeding;Direct supervision/assist for medications management;Direct supervision/assist for financial management;Assist for transportation;Help with stairs or ramp for entrance;Supervision due to cognitive status   Can travel by private vehicle        Equipment Recommendations  Other (comment) (tbd)    Recommendations for Other Services       Precautions / Restrictions Precautions Precautions: Fall Precaution Comments: h/o multiple falls Restrictions Weight Bearing Restrictions: No     Mobility  Bed Mobility Overal bed mobility: Needs Assistance Bed Mobility: Rolling, Supine to Sit, Sit to Supine     Supine to sit: HOB elevated, Used rails, Mod assist Sit to supine: Mod assist   General bed mobility comments: Physical assist to initiate  bringing legs off EOB to transition to sitting. Assist to raise legs onto bed    Transfers Overall transfer level: Needs assistance Equipment used: Rolling walker (2 wheels), 2 person hand held assist Transfers: Sit to/from Stand, Bed to chair/wheelchair/BSC Sit to Stand: Mod assist Stand pivot transfers: Mod assist, +2 physical assistance         General transfer comment: from EOB to RW mod of 1; from recliner bil HHA; assist to weight shift over RLE to allow advancing LLE    Ambulation/Gait Ambulation/Gait assistance: Mod assist, +2 safety/equipment Gait Distance (Feet): 5 Feet Assistive device: Rolling walker (2 wheels) Gait Pattern/deviations: Step-through pattern, Decreased stride length, Decreased weight shift to right, Decreased weight shift to left, Shuffle       General Gait Details: chair follow; very short shuffling steps with pt resisting assist to weight shift to either side to allow incr step length; difficulty directing RW in straight path   Stairs             Wheelchair Mobility     Tilt Bed    Modified Rankin (Stroke Patients Only) Modified Rankin (Stroke Patients Only) Pre-Morbid Rankin Score: No significant disability Modified Rankin: Moderately severe disability     Balance Overall balance assessment: Needs assistance, History of Falls Sitting-balance support: Bilateral upper extremity supported, Feet supported Sitting balance-Leahy Scale: Poor Sitting balance - Comments: sitting EOB, CGA provided for safety during balance Postural control: Posterior lean Standing balance support: Bilateral upper extremity supported Standing balance-Leahy Scale: Poor Standing balance comment: Reliant on support from OT to maintain standing balance. Pt demonstrated increased weight into heels while calves were using bed as support.  Cognition Arousal: Alert Behavior During Therapy: Anxious Overall Cognitive Status: No  family/caregiver present to determine baseline cognitive functioning Area of Impairment: Orientation, Attention, Memory, Following commands, Safety/judgement, Problem solving, Awareness                 Orientation Level: Disoriented to, Time, Situation, Place Current Attention Level: Focused Memory: Decreased recall of precautions, Decreased short-term memory Following Commands: Follows one step commands with increased time Safety/Judgement: Decreased awareness of safety, Decreased awareness of deficits   Problem Solving: Slow processing, Decreased initiation, Difficulty sequencing, Requires verbal cues, Requires tactile cues General Comments: Pt dysarthric and had increased difficulty with communication        Exercises      General Comments General comments (skin integrity, edema, etc.): pt recenetly returned to bed after "up all day" per sitter; returned to bed as pt uncomfortable in the chair      Pertinent Vitals/Pain Pain Assessment Pain Assessment: Faces Faces Pain Scale: Hurts whole lot Pain Location: left upper trap and LLE Pain Descriptors / Indicators: Grimacing, Guarding, Moaning Pain Intervention(s): Limited activity within patient's tolerance, Monitored during session, Repositioned, RN gave pain meds during session, Heat applied    Home Living Family/patient expects to be discharged to:: Private residence Living Arrangements: Children (son lives with him, works 2nd shift at a nursing home) Available Help at Discharge: Family Type of Home: House Home Access: Stairs to enter Entrance Stairs-Rails: Right;Left;Can reach both Entrance Stairs-Number of Steps: 3 in back, 4 in front   Home Layout: One level Home Equipment: Agricultural consultant (2 wheels);Rollator (4 wheels)      Prior Function            PT Goals (current goals can now be found in the care plan section) Acute Rehab PT Goals Patient Stated Goal: unable to state PT Goal Formulation: With  patient Time For Goal Achievement: 02/15/23 Potential to Achieve Goals: Good Progress towards PT goals: Progressing toward goals    Frequency    Min 1X/week      PT Plan      Co-evaluation              AM-PAC PT "6 Clicks" Mobility   Outcome Measure  Help needed turning from your back to your side while in a flat bed without using bedrails?: A Lot Help needed moving from lying on your back to sitting on the side of a flat bed without using bedrails?: A Lot Help needed moving to and from a bed to a chair (including a wheelchair)?: Total Help needed standing up from a chair using your arms (e.g., wheelchair or bedside chair)?: A Lot Help needed to walk in hospital room?: Total Help needed climbing 3-5 steps with a railing? : Total 6 Click Score: 9    End of Session Equipment Utilized During Treatment: Gait belt Activity Tolerance: Patient limited by fatigue Patient left: in bed;with call bell/phone within reach;with nursing/sitter in room Nurse Communication: Mobility status;Other (comment) (needs pain medication) PT Visit Diagnosis: Other abnormalities of gait and mobility (R26.89);Muscle weakness (generalized) (M62.81)     Time: 8295-6213 PT Time Calculation (min) (ACUTE ONLY): 21 min  Charges:    $Gait Training: 8-22 mins PT General Charges $$ ACUTE PT VISIT: 1 Visit                      Jerolyn Center, PT Acute Rehabilitation Services  Office [phone]    Zena Amos 02/02/2023, 2:36 PM

## 2023-02-02 NOTE — Plan of Care (Signed)
  Problem: Coping: Goal: Will verbalize positive feelings about self Outcome: Progressing   Problem: Health Behavior/Discharge Planning: Goal: Goals will be collaboratively established with patient/family Outcome: Progressing   Problem: Ischemic Stroke/TIA Tissue Perfusion: Goal: Complications of ischemic stroke/TIA will be minimized Outcome: Progressing

## 2023-02-02 NOTE — Progress Notes (Signed)
STROKE TEAM PROGRESS NOTE   SUBJECTIVE (INTERVAL HISTORY) NT is at the bedside.  Patient is reclining in bed, some restless and more dysarthria with perseveration, needed sitter overnight. He was trying to pull off his foley which caused some bleeding from the foley, also he tried to pull off his gown and tele.    OBJECTIVE Temp:  [98.3 F (36.8 C)-99.3 F (37.4 C)] 98.3 F (36.8 C) (12/10 1055) Pulse Rate:  [76-115] 76 (12/10 0419) Cardiac Rhythm: Normal sinus rhythm (12/10 0735) Resp:  [18-20] 18 (12/10 1055) BP: (130-152)/(67-85) 130/73 (12/10 1055) SpO2:  [95 %-100 %] 96 % (12/10 1055)  Recent Labs  Lab 01/31/23 0035  GLUCAP 93   Recent Labs  Lab 01/31/23 0040 01/31/23 0042 02/01/23 0444 02/02/23 0455  NA 139 139 137 136  K 3.5 3.6 3.7 3.4*  CL 104 102 105 101  CO2 24  --  23 24  GLUCOSE 102* 102* 80 101*  BUN 27* 27* 9 14  CREATININE 1.33* 1.40* 1.06 1.07  CALCIUM 9.7  --  8.3* 9.4   Recent Labs  Lab 01/31/23 0040  AST 27  ALT 15  ALKPHOS 55  BILITOT 0.5  PROT 7.2  ALBUMIN 4.0   Recent Labs  Lab 01/31/23 0040 01/31/23 0042 01/31/23 0454 02/01/23 0444 02/02/23 0455  WBC 6.7  --  6.4 6.3 8.4  NEUTROABS 4.5  --   --  4.5  --   HGB 12.5* 12.9* 12.9* 9.3* 11.0*  HCT 37.6* 38.0* 38.9* 29.1* 32.5*  MCV 90.6  --  89.6 92.7 88.3  PLT 192  --  196 153 165   No results for input(s): "CKTOTAL", "CKMB", "CKMBINDEX", "TROPONINI" in the last 168 hours. Recent Labs    01/31/23 0040  LABPROT 13.6  INR 1.0   Recent Labs    01/31/23 0039  COLORURINE AMBER*  LABSPEC 1.017  PHURINE 5.0  GLUCOSEU NEGATIVE  HGBUR NEGATIVE  BILIRUBINUR NEGATIVE  KETONESUR 5*  PROTEINUR NEGATIVE  NITRITE NEGATIVE  LEUKOCYTESUR NEGATIVE       Component Value Date/Time   CHOL 235 (H) 01/31/2023 0454   CHOL 202 (H) 07/10/2022 1012   CHOL 219 (H) 06/17/2012 1620   TRIG 47 01/31/2023 0454   TRIG 215 (H) 06/17/2012 1620   HDL 59 01/31/2023 0454   HDL 48 07/10/2022 1012    HDL 34 (L) 06/17/2012 1620   CHOLHDL 4.0 01/31/2023 0454   VLDL 9 01/31/2023 0454   LDLCALC 167 (H) 01/31/2023 0454   LDLCALC 134 (H) 07/10/2022 1012   LDLCALC 142 (H) 06/17/2012 1620   Lab Results  Component Value Date   HGBA1C 5.1 01/31/2023      Component Value Date/Time   LABOPIA NONE DETECTED 01/31/2023 0039   COCAINSCRNUR NONE DETECTED 01/31/2023 0039   LABBENZ NONE DETECTED 01/31/2023 0039   AMPHETMU NONE DETECTED 01/31/2023 0039   THCU NONE DETECTED 01/31/2023 0039   LABBARB NONE DETECTED 01/31/2023 0039    Recent Labs  Lab 01/31/23 0040  ETH <10    I have personally reviewed the radiological images below and agree with the radiology interpretations.  IR PERCUTANEOUS ART THROMBECTOMY/INFUSION INTRACRANIAL INC DIAG ANGIO  Result Date: 02/02/2023 INDICATION: History of repeated falls. Patient with new onset expressive aphasia and possible right sided weakness. 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 Also noted mid basilar artery occlusion. EXAM: 1. EMERGENT LARGE VESSEL OCCLUSION THROMBOLYSIS (anterior CIRCULATION) COMPARISON:  Recent  CT angiogram of the head and neck, and MRI of the brain. MEDICATIONS: Ancef 2 g IV was administered within 1 hour of the procedure. ANESTHESIA/SEDATION: General anesthesia. CONTRAST:  Omnipaque 300 approximately 110 mL. FLUOROSCOPY TIME:  Fluoroscopy Time: 31 minutes 42 seconds (1110 mGy). COMPLICATIONS: None immediate. TECHNIQUE: Following a full explanation of the procedure along with the potential associated complications, an informed witnessed consent was obtained. The risks of intracranial hemorrhage of 10%, worsening neurological deficit, ventilator dependency, death and inability to revascularize were all reviewed in detail with the patient. The patient was then put under general anesthesia by the Department of Anesthesiology at [facility]. The right  groin was prepped and draped in the usual sterile fashion. Thereafter using modified Seldinger technique, transfemoral access into the right common femoral artery was obtained without difficulty. Over an 0.035 inch guidewire an 8 French 25 cm Pinnacle sheath was inserted. Through this, and also over an 0.035 inch guidewire a combination of a 125 cm 6 [place] Berenstein support catheter inside of an 087 95 cm balloon guide catheter was advanced to the aortic arch region and advanced to the distal left common carotid artery. The guidewire, and the support catheter were removed. Good aspiration was obtained from the hub of the balloon guide catheter. A control arteriogram was then performed centered extra cranially and intracranially. FINDINGS: The left common carotid arteriogram demonstrates the left external carotid artery and its major branches to be widely patent. The left internal carotid artery just distal to the bulb has a focal severe stenosis. 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 PROCEDURE: Through the balloon guide catheter in the distal left ICA, and over an 014 inch 300 cm exchange micro guidewire positioned in the horizontal petrous segment, control angioplasty of the proximal left internal carotid stenosis was performed with a 4 mm x 30 cm Viatrac 14 angioplasty balloon catheter which had been prepped with 50% contrast and 50% heparinized saline infusion. Using the rapid exchange technique, the balloon was positioned with its markers adequate distant from the site of severe stenosis. A control inflation was then performed using a micro inflation syringe device via micro tubing to approximately 4 mm where it was maintained for approximately a minute and a half. Thereafter, following deflation of the balloon and removal a control arteriogram performed  through the balloon guide catheter demonstrated now improved caliber of the angioplastied segment. At this time a 071 Zoom aspiration catheter was advanced in combination with an 021 160 cm Trevo ProVue microcatheter over the exchange micro guidewire to the proximal petrous segment. The 300 cm 014 exchange micro guidewire was then exchanged for an 018 inch micro guidewire with a moderate J configuration distally. The micro guidewire with the microcatheter was then gently advanced without difficulty through the occluded supraclinoid left ICA to the middle cerebral artery inferior division M2 segment followed by the microcatheter. The guidewire was removed. Good aspiration obtained from the hub of the microcatheter. A gentle control arteriogram performed through this demonstrated safe positioning of the tip of the microcatheter. A 4 mm x 40 mm Solitaire X retrieval device was then deployed with the proximal portion in the supraclinoid left ICA. At this time the Zoom aspiration catheter was advanced without difficulty to the mid M1 segment. Control aspiration was then performed for approximately a minute and a  half at the hub of the Zoom aspiration catheter, and with a 20 mL syringe at the hub of the balloon guide catheter for approximately a minute and a half. The combination was then retrieved and removed. Following reversal of flow arrest in the left internal carotid artery a control arteriogram performed demonstrated complete revascularization of the supraclinoid left ICA with opacification of the left middle cerebral artery achieving a TICI 3 revascularization. Also noted was opacification of the proximal left A1 segment with focal areas of severe stenosis due to arteriosclerosis. Bilateral PCOMs demonstrated opacification into the left posterior cerebral artery P1 segment. Underlying intracranial arteriosclerosis with resulting severe stenosis was noted just proximal to the origins of the posterior communicating  arteries. Through the balloon guide catheter in the distal left ICA, an aspiration 5 French 115 cm guide catheter was then advanced to the proximal cavernous segment over an 035 inch guidewire. Through this, 160 cm Trevo ProVue microcatheter was advanced over an 018 inch micro guidewire. The microcatheter and the micro guidewire were advanced to the distal M2 M3 regions of the inferior division followed by the microcatheter. The guidewire was removed. Good aspiration obtained from the hub of the microcatheter. This in turn was exchanged for an 014 inch 300 cm exchange micro guidewire with a moderate J configuration at its distal end. A control arteriogram performed demonstrated safe positioning of the tip of the micro guidewire. Measurements were then performed of the left ICA supraclinoid segment just proximal to the bifurcation and just distal to the origin of the ophthalmic artery. A 3 mm x 12 mm Onyx Frontier balloon mounted stent which had been prepped antegradely with 50% contrast and 50% heparinized saline infusion, and retrogradely with heparinized saline infusion, was advanced in a coaxial manner and with constant heparinized saline infusion and positioned without difficulty with the stent markers adequate distant from the area of severe stenosis. A control inflation was then performed using a micro inflation syringe device via micro tubing to just over 3 mm x 2 where it was maintained for approximately a minute and a half. Following balloon deflation and retrieval and removal, a control arteriogram performed through the 5 [place] intermediate catheter demonstrated significantly improved caliber of the supraclinoid left ICA with a TICI 3 revascularization of the left MCA being persistent. Again demonstrated was the stenosed left anterior cerebral A1 segment. Over the exchange micro guidewire now in the proximal cavernous segment, a control arteriogram performed through the balloon guide catheter following  removal of the intermediate catheter. Measurements were then performed of the left internal carotid artery proximally at the site of the significant stenosis and the distal left common carotid artery. A 6/8 mm x 40 mm Xact stent delivery apparatus was then advanced in a coaxial manner and with constant heparinized saline infusion and positioned without difficulty with the adequate coverage distal to the angioplastied segment and into the distal left common carotid artery. The stent was then deployed in the usual manner without any difficulty. The delivery apparatus was removed. A control arteriogram performed through the balloon guide catheter demonstrated significantly improved caliber through the stented segment of the proximal left ICA. The waist at the site of the angioplasty was then treated with a 5 mm x 30 mm Viatrac angioplasty microcatheter which was advanced after its preparation as described above. Prior to the angioplasty, proximal flow arrest was initiated by inflating the balloon in the distal left common carotid artery, and with proximal aspiration during the angioplasty. Following the angioplasty,  the balloon was retrieved and removed while aspiration was maintained as flow arrest was reversed. Control arteriograms were then performed through the balloon guide catheter over the next 10-20 minutes continued to demonstrate excellent flow through the centered in the proximal left ICA. Intracranially no change was seen in the TICI 3 revascularization of the left middle cerebral artery distribution. A 5 French diagnostic catheter was then advanced into the right common carotid artery over an 035 inch guidewire. The right common carotid artery injection demonstrated patency of the right internal carotid artery at the neck, and intracranially. The petrous, the cavernous and the supraclinoid right ICA demonstrate wide patency. The right middle cerebral artery and the right anterior cerebral artery  demonstrate wide patency into the capillary and venous phases with a 50% stenosis of the right middle cerebral artery M1 segment. Cross-filling via the anterior communicating artery of the left anterior cerebral A2 segment, and the left M1 segment was evident. An 8 French Angio-Seal closure device was deployed at the right groin puncture site for hemostasis. Distal pulses remained present unchanged in both feet at the end of the procedure. Flat panel CT of the brain demonstrated no evidence of intracranial hemorrhage. Patient was extubated in was beginning to respond to simple commands appropriately slightly weaker on the right side. Medications utilized during the procedure. 81 mg of aspirin and Plavix 15 mg via orogastric tube prior to angioplasty. The patient was also given half bolus dose of cangrelor followed by a 4 hour half dose infusion to be followed by CT of the brain. Patient was then transferred to the neuro ICU for post revascularization care. 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 Underlying a significant atherosclerotic related stenosis treated with a 3 mm x 12 mm Onyx Frontier balloon mounted stent with significantly improved caliber and flow. Status post stent assisted angioplasty of proximal left ICA stenosis with proximal flow arrest as described without event. PLAN: Follow-up in the clinic 4 weeks post discharge. Electronically Signed   By: Julieanne Cotton M.D.   On: 02/02/2023 07:58   IR CT Head Ltd  Result Date: 02/02/2023 INDICATION: History of repeated falls. Patient with new onset expressive aphasia and possible right sided weakness. 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 Also noted mid basilar  artery occlusion. EXAM: 1. EMERGENT LARGE VESSEL OCCLUSION THROMBOLYSIS (anterior CIRCULATION) COMPARISON:  Recent CT angiogram of the head and neck, and MRI of the brain. MEDICATIONS: Ancef 2 g IV was administered within 1 hour of the procedure. ANESTHESIA/SEDATION: General anesthesia. CONTRAST:  Omnipaque 300 approximately 110 mL. FLUOROSCOPY TIME:  Fluoroscopy Time: 31 minutes 42 seconds (1110 mGy). COMPLICATIONS: None immediate. TECHNIQUE: Following a full explanation of the procedure along with the potential associated complications, an informed witnessed consent was obtained. The risks of intracranial hemorrhage of 10%, worsening neurological deficit, ventilator dependency, death and inability to revascularize were all reviewed in detail with the patient. The patient was then put under general anesthesia by the Department of Anesthesiology at [facility]. The right groin was prepped and draped in the usual sterile fashion. Thereafter using modified Seldinger technique, transfemoral access into the right common femoral artery was obtained without difficulty. Over an 0.035 inch guidewire an 8 French 25 cm Pinnacle sheath  was inserted. Through this, and also over an 0.035 inch guidewire a combination of a 125 cm 6 [place] Berenstein support catheter inside of an 087 95 cm balloon guide catheter was advanced to the aortic arch region and advanced to the distal left common carotid artery. The guidewire, and the support catheter were removed. Good aspiration was obtained from the hub of the balloon guide catheter. A control arteriogram was then performed centered extra cranially and intracranially. FINDINGS: The left common carotid arteriogram demonstrates the left external carotid artery and its major branches to be widely patent. The left internal carotid artery just distal to the bulb has a focal severe stenosis. 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 PROCEDURE: Through the balloon guide catheter in the distal left ICA, and over an 014 inch 300 cm exchange micro guidewire positioned in the horizontal petrous segment, control angioplasty of the proximal left internal carotid stenosis was performed with a 4 mm x 30 cm Viatrac 14 angioplasty balloon catheter which had been prepped with 50% contrast and 50% heparinized saline infusion. Using the rapid exchange technique, the balloon was positioned with its markers adequate distant from the site of severe stenosis. A control inflation was then performed using a micro inflation syringe device via micro tubing to approximately 4 mm where it was maintained for approximately a minute and a half. Thereafter, following deflation of the balloon and removal a control arteriogram performed through the balloon guide catheter demonstrated now improved caliber of the angioplastied segment. At this time a 071 Zoom aspiration catheter was advanced in combination with an 021 160 cm Trevo ProVue microcatheter over the exchange micro guidewire to the proximal petrous segment. The 300 cm 014 exchange micro guidewire was then exchanged for an 018 inch micro guidewire with a moderate J configuration distally. The micro guidewire with the microcatheter was then gently advanced without difficulty through the occluded supraclinoid left ICA to the middle cerebral artery inferior division M2 segment followed by the microcatheter. The guidewire was removed. Good aspiration obtained from the hub of the microcatheter. A gentle control arteriogram performed through this demonstrated safe positioning of the tip of the microcatheter. A 4 mm x 40 mm Solitaire X retrieval device was then deployed with the proximal portion in the supraclinoid left ICA. At this time the Zoom aspiration catheter was advanced without difficulty to the mid M1 segment.  Control aspiration was then performed for approximately a minute and a half at the hub of the Zoom aspiration catheter, and with a 20 mL syringe at the hub of the balloon guide catheter for approximately a minute and a half. The combination was then retrieved and removed. Following reversal of flow arrest in the left internal carotid artery a control arteriogram performed demonstrated complete revascularization of the supraclinoid left ICA with opacification of the left middle cerebral artery achieving a TICI 3 revascularization. Also noted was opacification of the proximal left A1 segment with focal areas of severe stenosis due to arteriosclerosis. Bilateral PCOMs demonstrated opacification into the left posterior cerebral artery P1 segment. Underlying intracranial arteriosclerosis with resulting severe stenosis was noted just proximal to the origins of the posterior communicating arteries. Through the balloon guide catheter in the distal left ICA, an aspiration 5 French 115 cm guide catheter was then advanced to the proximal cavernous segment over an 035  inch guidewire. Through this, 160 cm Trevo ProVue microcatheter was advanced over an 018 inch micro guidewire. The microcatheter and the micro guidewire were advanced to the distal M2 M3 regions of the inferior division followed by the microcatheter. The guidewire was removed. Good aspiration obtained from the hub of the microcatheter. This in turn was exchanged for an 014 inch 300 cm exchange micro guidewire with a moderate J configuration at its distal end. A control arteriogram performed demonstrated safe positioning of the tip of the micro guidewire. Measurements were then performed of the left ICA supraclinoid segment just proximal to the bifurcation and just distal to the origin of the ophthalmic artery. A 3 mm x 12 mm Onyx Frontier balloon mounted stent which had been prepped antegradely with 50% contrast and 50% heparinized saline infusion, and  retrogradely with heparinized saline infusion, was advanced in a coaxial manner and with constant heparinized saline infusion and positioned without difficulty with the stent markers adequate distant from the area of severe stenosis. A control inflation was then performed using a micro inflation syringe device via micro tubing to just over 3 mm x 2 where it was maintained for approximately a minute and a half. Following balloon deflation and retrieval and removal, a control arteriogram performed through the 5 [place] intermediate catheter demonstrated significantly improved caliber of the supraclinoid left ICA with a TICI 3 revascularization of the left MCA being persistent. Again demonstrated was the stenosed left anterior cerebral A1 segment. Over the exchange micro guidewire now in the proximal cavernous segment, a control arteriogram performed through the balloon guide catheter following removal of the intermediate catheter. Measurements were then performed of the left internal carotid artery proximally at the site of the significant stenosis and the distal left common carotid artery. A 6/8 mm x 40 mm Xact stent delivery apparatus was then advanced in a coaxial manner and with constant heparinized saline infusion and positioned without difficulty with the adequate coverage distal to the angioplastied segment and into the distal left common carotid artery. The stent was then deployed in the usual manner without any difficulty. The delivery apparatus was removed. A control arteriogram performed through the balloon guide catheter demonstrated significantly improved caliber through the stented segment of the proximal left ICA. The waist at the site of the angioplasty was then treated with a 5 mm x 30 mm Viatrac angioplasty microcatheter which was advanced after its preparation as described above. Prior to the angioplasty, proximal flow arrest was initiated by inflating the balloon in the distal left common carotid  artery, and with proximal aspiration during the angioplasty. Following the angioplasty, the balloon was retrieved and removed while aspiration was maintained as flow arrest was reversed. Control arteriograms were then performed through the balloon guide catheter over the next 10-20 minutes continued to demonstrate excellent flow through the centered in the proximal left ICA. Intracranially no change was seen in the TICI 3 revascularization of the left middle cerebral artery distribution. A 5 French diagnostic catheter was then advanced into the right common carotid artery over an 035 inch guidewire. The right common carotid artery injection demonstrated patency of the right internal carotid artery at the neck, and intracranially. The petrous, the cavernous and the supraclinoid right ICA demonstrate wide patency. The right middle cerebral artery and the right anterior cerebral artery demonstrate wide patency into the capillary and venous phases with a 50% stenosis of the right middle cerebral artery M1 segment. Cross-filling via the anterior communicating artery of the left anterior  cerebral A2 segment, and the left M1 segment was evident. An 8 French Angio-Seal closure device was deployed at the right groin puncture site for hemostasis. Distal pulses remained present unchanged in both feet at the end of the procedure. Flat panel CT of the brain demonstrated no evidence of intracranial hemorrhage. Patient was extubated in was beginning to respond to simple commands appropriately slightly weaker on the right side. Medications utilized during the procedure. 81 mg of aspirin and Plavix 15 mg via orogastric tube prior to angioplasty. The patient was also given half bolus dose of cangrelor followed by a 4 hour half dose infusion to be followed by CT of the brain. Patient was then transferred to the neuro ICU for post revascularization care. IMPRESSION: Status post complete revascularization of occluded left internal  carotid artery terminus with 1 pass with a 4 mm x 40 mm Solitaire X retrieval device, and contact aspiration achieving a TICI 3 revascularization of the left MCA distribution. Underlying a significant atherosclerotic related stenosis treated with a 3 mm x 12 mm Onyx Frontier balloon mounted stent with significantly improved caliber and flow. Status post stent assisted angioplasty of proximal left ICA stenosis with proximal flow arrest as described without event. PLAN: Follow-up in the clinic 4 weeks post discharge. Electronically Signed   By: Julieanne Cotton M.D.   On: 02/02/2023 07:58   IR INTRAVSC STENT CERV CAROTID W/O EMB-PROT MOD SED  Result Date: 02/02/2023 INDICATION: History of repeated falls. Patient with new onset expressive aphasia and possible right sided weakness. 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 Also noted mid basilar artery occlusion. EXAM: 1. EMERGENT LARGE VESSEL OCCLUSION THROMBOLYSIS (anterior CIRCULATION) COMPARISON:  Recent CT angiogram of the head and neck, and MRI of the brain. MEDICATIONS: Ancef 2 g IV was administered within 1 hour of the procedure. ANESTHESIA/SEDATION: General anesthesia. CONTRAST:  Omnipaque 300 approximately 110 mL. FLUOROSCOPY TIME:  Fluoroscopy Time: 31 minutes 42 seconds (1110 mGy). COMPLICATIONS: None immediate. TECHNIQUE: Following a full explanation of the procedure along with the potential associated complications, an informed witnessed consent was obtained. The risks of intracranial hemorrhage of 10%, worsening neurological deficit, ventilator dependency, death and inability to revascularize were all reviewed in detail with the patient. The patient was then put under general anesthesia by the Department of Anesthesiology at [facility]. The right groin was prepped and draped in the usual sterile fashion. Thereafter using modified Seldinger technique, transfemoral  access into the right common femoral artery was obtained without difficulty. Over an 0.035 inch guidewire an 8 French 25 cm Pinnacle sheath was inserted. Through this, and also over an 0.035 inch guidewire a combination of a 125 cm 6 [place] Berenstein support catheter inside of an 087 95 cm balloon guide catheter was advanced to the aortic arch region and advanced to the distal left common carotid artery. The guidewire, and the support catheter were removed. Good aspiration was obtained from the hub of the balloon guide catheter. A control arteriogram was then performed centered extra cranially and intracranially. FINDINGS: The left common carotid arteriogram demonstrates the left external carotid artery and its major branches to be widely patent. The left internal carotid artery just distal to the bulb has a focal severe stenosis. 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 PROCEDURE: Through the balloon guide catheter in the distal left ICA, and over an 014 inch 300 cm exchange micro guidewire positioned in the horizontal petrous segment, control angioplasty of the proximal left internal carotid stenosis was performed with a 4 mm x 30 cm Viatrac 14 angioplasty balloon catheter which had been prepped with 50% contrast and 50% heparinized saline infusion. Using the rapid exchange technique, the balloon was positioned with its markers adequate distant from the site of severe stenosis. A control inflation was then performed using a micro inflation syringe device via micro tubing to approximately 4 mm where it was maintained for approximately a minute and a half. Thereafter, following deflation of the balloon and removal a control arteriogram performed through the balloon guide catheter demonstrated now improved caliber of the angioplastied segment. At this time a 071 Zoom  aspiration catheter was advanced in combination with an 021 160 cm Trevo ProVue microcatheter over the exchange micro guidewire to the proximal petrous segment. The 300 cm 014 exchange micro guidewire was then exchanged for an 018 inch micro guidewire with a moderate J configuration distally. The micro guidewire with the microcatheter was then gently advanced without difficulty through the occluded supraclinoid left ICA to the middle cerebral artery inferior division M2 segment followed by the microcatheter. The guidewire was removed. Good aspiration obtained from the hub of the microcatheter. A gentle control arteriogram performed through this demonstrated safe positioning of the tip of the microcatheter. A 4 mm x 40 mm Solitaire X retrieval device was then deployed with the proximal portion in the supraclinoid left ICA. At this time the Zoom aspiration catheter was advanced without difficulty to the mid M1 segment. Control aspiration was then performed for approximately a minute and a half at the hub of the Zoom aspiration catheter, and with a 20 mL syringe at the hub of the balloon guide catheter for approximately a minute and a half. The combination was then retrieved and removed. Following reversal of flow arrest in the left internal carotid artery a control arteriogram performed demonstrated complete revascularization of the supraclinoid left ICA with opacification of the left middle cerebral artery achieving a TICI 3 revascularization. Also noted was opacification of the proximal left A1 segment with focal areas of severe stenosis due to arteriosclerosis. Bilateral PCOMs demonstrated opacification into the left posterior cerebral artery P1 segment. Underlying intracranial arteriosclerosis with resulting severe stenosis was noted just proximal to the origins of the posterior communicating arteries. Through the balloon guide catheter in the distal left ICA, an aspiration 5 French 115 cm guide catheter was then  advanced to the proximal cavernous segment over an 035 inch guidewire. Through this, 160 cm Trevo ProVue microcatheter was advanced over an 018 inch micro guidewire. The microcatheter and the micro guidewire were advanced to the distal M2 M3 regions of the inferior division followed by the microcatheter. The guidewire was removed. Good aspiration obtained from the hub of the microcatheter. This in turn was exchanged for an 014 inch 300 cm exchange micro guidewire with a moderate J configuration at its distal end. A control arteriogram performed demonstrated safe positioning of the tip of the micro guidewire. Measurements were then performed of the left ICA supraclinoid segment just proximal to the bifurcation and just distal to the origin of the ophthalmic artery. A 3 mm x 12 mm Onyx Frontier balloon mounted stent which had been prepped antegradely with 50% contrast and 50% heparinized saline infusion, and retrogradely with heparinized saline infusion, was advanced in  a coaxial manner and with constant heparinized saline infusion and positioned without difficulty with the stent markers adequate distant from the area of severe stenosis. A control inflation was then performed using a micro inflation syringe device via micro tubing to just over 3 mm x 2 where it was maintained for approximately a minute and a half. Following balloon deflation and retrieval and removal, a control arteriogram performed through the 5 [place] intermediate catheter demonstrated significantly improved caliber of the supraclinoid left ICA with a TICI 3 revascularization of the left MCA being persistent. Again demonstrated was the stenosed left anterior cerebral A1 segment. Over the exchange micro guidewire now in the proximal cavernous segment, a control arteriogram performed through the balloon guide catheter following removal of the intermediate catheter. Measurements were then performed of the left internal carotid artery proximally at the  site of the significant stenosis and the distal left common carotid artery. A 6/8 mm x 40 mm Xact stent delivery apparatus was then advanced in a coaxial manner and with constant heparinized saline infusion and positioned without difficulty with the adequate coverage distal to the angioplastied segment and into the distal left common carotid artery. The stent was then deployed in the usual manner without any difficulty. The delivery apparatus was removed. A control arteriogram performed through the balloon guide catheter demonstrated significantly improved caliber through the stented segment of the proximal left ICA. The waist at the site of the angioplasty was then treated with a 5 mm x 30 mm Viatrac angioplasty microcatheter which was advanced after its preparation as described above. Prior to the angioplasty, proximal flow arrest was initiated by inflating the balloon in the distal left common carotid artery, and with proximal aspiration during the angioplasty. Following the angioplasty, the balloon was retrieved and removed while aspiration was maintained as flow arrest was reversed. Control arteriograms were then performed through the balloon guide catheter over the next 10-20 minutes continued to demonstrate excellent flow through the centered in the proximal left ICA. Intracranially no change was seen in the TICI 3 revascularization of the left middle cerebral artery distribution. A 5 French diagnostic catheter was then advanced into the right common carotid artery over an 035 inch guidewire. The right common carotid artery injection demonstrated patency of the right internal carotid artery at the neck, and intracranially. The petrous, the cavernous and the supraclinoid right ICA demonstrate wide patency. The right middle cerebral artery and the right anterior cerebral artery demonstrate wide patency into the capillary and venous phases with a 50% stenosis of the right middle cerebral artery M1 segment.  Cross-filling via the anterior communicating artery of the left anterior cerebral A2 segment, and the left M1 segment was evident. An 8 French Angio-Seal closure device was deployed at the right groin puncture site for hemostasis. Distal pulses remained present unchanged in both feet at the end of the procedure. Flat panel CT of the brain demonstrated no evidence of intracranial hemorrhage. Patient was extubated in was beginning to respond to simple commands appropriately slightly weaker on the right side. Medications utilized during the procedure. 81 mg of aspirin and Plavix 15 mg via orogastric tube prior to angioplasty. The patient was also given half bolus dose of cangrelor followed by a 4 hour half dose infusion to be followed by CT of the brain. Patient was then transferred to the neuro ICU for post revascularization care. IMPRESSION: Status post complete revascularization of occluded left internal carotid artery terminus with 1 pass with a 4 mm x  40 mm Solitaire X retrieval device, and contact aspiration achieving a TICI 3 revascularization of the left MCA distribution. Underlying a significant atherosclerotic related stenosis treated with a 3 mm x 12 mm Onyx Frontier balloon mounted stent with significantly improved caliber and flow. Status post stent assisted angioplasty of proximal left ICA stenosis with proximal flow arrest as described without event. PLAN: Follow-up in the clinic 4 weeks post discharge. Electronically Signed   By: Julieanne Cotton M.D.   On: 02/02/2023 07:58   IR Intra Cran Stent  Result Date: 02/02/2023 INDICATION: History of repeated falls. Patient with new onset expressive aphasia and possible right sided weakness. 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 Also noted mid basilar artery occlusion. EXAM: 1. EMERGENT LARGE VESSEL OCCLUSION THROMBOLYSIS (anterior CIRCULATION) COMPARISON:  Recent CT  angiogram of the head and neck, and MRI of the brain. MEDICATIONS: Ancef 2 g IV was administered within 1 hour of the procedure. ANESTHESIA/SEDATION: General anesthesia. CONTRAST:  Omnipaque 300 approximately 110 mL. FLUOROSCOPY TIME:  Fluoroscopy Time: 31 minutes 42 seconds (1110 mGy). COMPLICATIONS: None immediate. TECHNIQUE: Following a full explanation of the procedure along with the potential associated complications, an informed witnessed consent was obtained. The risks of intracranial hemorrhage of 10%, worsening neurological deficit, ventilator dependency, death and inability to revascularize were all reviewed in detail with the patient. The patient was then put under general anesthesia by the Department of Anesthesiology at [facility]. The right groin was prepped and draped in the usual sterile fashion. Thereafter using modified Seldinger technique, transfemoral access into the right common femoral artery was obtained without difficulty. Over an 0.035 inch guidewire an 8 French 25 cm Pinnacle sheath was inserted. Through this, and also over an 0.035 inch guidewire a combination of a 125 cm 6 [place] Berenstein support catheter inside of an 087 95 cm balloon guide catheter was advanced to the aortic arch region and advanced to the distal left common carotid artery. The guidewire, and the support catheter were removed. Good aspiration was obtained from the hub of the balloon guide catheter. A control arteriogram was then performed centered extra cranially and intracranially. FINDINGS: The left common carotid arteriogram demonstrates the left external carotid artery and its major branches to be widely patent. The left internal carotid artery just distal to the bulb has a focal severe stenosis. 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 PROCEDURE: Through  the balloon guide catheter in the distal left ICA, and over an 014 inch 300 cm exchange micro guidewire positioned in the horizontal petrous segment, control angioplasty of the proximal left internal carotid stenosis was performed with a 4 mm x 30 cm Viatrac 14 angioplasty balloon catheter which had been prepped with 50% contrast and 50% heparinized saline infusion. Using the rapid exchange technique, the balloon was positioned with its markers adequate distant from the site of severe stenosis. A control inflation was then performed using a micro inflation syringe device via micro tubing to approximately 4 mm where it was maintained for approximately a minute and a half. Thereafter, following deflation of the balloon and removal a control arteriogram performed through the balloon guide catheter demonstrated now improved caliber of the angioplastied segment. At this time a 071 Zoom aspiration catheter was advanced in combination with an  021 160 cm Trevo ProVue microcatheter over the exchange micro guidewire to the proximal petrous segment. The 300 cm 014 exchange micro guidewire was then exchanged for an 018 inch micro guidewire with a moderate J configuration distally. The micro guidewire with the microcatheter was then gently advanced without difficulty through the occluded supraclinoid left ICA to the middle cerebral artery inferior division M2 segment followed by the microcatheter. The guidewire was removed. Good aspiration obtained from the hub of the microcatheter. A gentle control arteriogram performed through this demonstrated safe positioning of the tip of the microcatheter. A 4 mm x 40 mm Solitaire X retrieval device was then deployed with the proximal portion in the supraclinoid left ICA. At this time the Zoom aspiration catheter was advanced without difficulty to the mid M1 segment. Control aspiration was then performed for approximately a minute and a half at the hub of the Zoom aspiration catheter, and  with a 20 mL syringe at the hub of the balloon guide catheter for approximately a minute and a half. The combination was then retrieved and removed. Following reversal of flow arrest in the left internal carotid artery a control arteriogram performed demonstrated complete revascularization of the supraclinoid left ICA with opacification of the left middle cerebral artery achieving a TICI 3 revascularization. Also noted was opacification of the proximal left A1 segment with focal areas of severe stenosis due to arteriosclerosis. Bilateral PCOMs demonstrated opacification into the left posterior cerebral artery P1 segment. Underlying intracranial arteriosclerosis with resulting severe stenosis was noted just proximal to the origins of the posterior communicating arteries. Through the balloon guide catheter in the distal left ICA, an aspiration 5 French 115 cm guide catheter was then advanced to the proximal cavernous segment over an 035 inch guidewire. Through this, 160 cm Trevo ProVue microcatheter was advanced over an 018 inch micro guidewire. The microcatheter and the micro guidewire were advanced to the distal M2 M3 regions of the inferior division followed by the microcatheter. The guidewire was removed. Good aspiration obtained from the hub of the microcatheter. This in turn was exchanged for an 014 inch 300 cm exchange micro guidewire with a moderate J configuration at its distal end. A control arteriogram performed demonstrated safe positioning of the tip of the micro guidewire. Measurements were then performed of the left ICA supraclinoid segment just proximal to the bifurcation and just distal to the origin of the ophthalmic artery. A 3 mm x 12 mm Onyx Frontier balloon mounted stent which had been prepped antegradely with 50% contrast and 50% heparinized saline infusion, and retrogradely with heparinized saline infusion, was advanced in a coaxial manner and with constant heparinized saline infusion and  positioned without difficulty with the stent markers adequate distant from the area of severe stenosis. A control inflation was then performed using a micro inflation syringe device via micro tubing to just over 3 mm x 2 where it was maintained for approximately a minute and a half. Following balloon deflation and retrieval and removal, a control arteriogram performed through the 5 [place] intermediate catheter demonstrated significantly improved caliber of the supraclinoid left ICA with a TICI 3 revascularization of the left MCA being persistent. Again demonstrated was the stenosed left anterior cerebral A1 segment. Over the exchange micro guidewire now in the proximal cavernous segment, a control arteriogram performed through the balloon guide catheter following removal of the intermediate catheter. Measurements were then performed of the left internal carotid artery proximally at the site of the significant stenosis and the distal  left common carotid artery. A 6/8 mm x 40 mm Xact stent delivery apparatus was then advanced in a coaxial manner and with constant heparinized saline infusion and positioned without difficulty with the adequate coverage distal to the angioplastied segment and into the distal left common carotid artery. The stent was then deployed in the usual manner without any difficulty. The delivery apparatus was removed. A control arteriogram performed through the balloon guide catheter demonstrated significantly improved caliber through the stented segment of the proximal left ICA. The waist at the site of the angioplasty was then treated with a 5 mm x 30 mm Viatrac angioplasty microcatheter which was advanced after its preparation as described above. Prior to the angioplasty, proximal flow arrest was initiated by inflating the balloon in the distal left common carotid artery, and with proximal aspiration during the angioplasty. Following the angioplasty, the balloon was retrieved and removed while  aspiration was maintained as flow arrest was reversed. Control arteriograms were then performed through the balloon guide catheter over the next 10-20 minutes continued to demonstrate excellent flow through the centered in the proximal left ICA. Intracranially no change was seen in the TICI 3 revascularization of the left middle cerebral artery distribution. A 5 French diagnostic catheter was then advanced into the right common carotid artery over an 035 inch guidewire. The right common carotid artery injection demonstrated patency of the right internal carotid artery at the neck, and intracranially. The petrous, the cavernous and the supraclinoid right ICA demonstrate wide patency. The right middle cerebral artery and the right anterior cerebral artery demonstrate wide patency into the capillary and venous phases with a 50% stenosis of the right middle cerebral artery M1 segment. Cross-filling via the anterior communicating artery of the left anterior cerebral A2 segment, and the left M1 segment was evident. An 8 French Angio-Seal closure device was deployed at the right groin puncture site for hemostasis. Distal pulses remained present unchanged in both feet at the end of the procedure. Flat panel CT of the brain demonstrated no evidence of intracranial hemorrhage. Patient was extubated in was beginning to respond to simple commands appropriately slightly weaker on the right side. Medications utilized during the procedure. 81 mg of aspirin and Plavix 15 mg via orogastric tube prior to angioplasty. The patient was also given half bolus dose of cangrelor followed by a 4 hour half dose infusion to be followed by CT of the brain. Patient was then transferred to the neuro ICU for post revascularization care. IMPRESSION: Status post complete revascularization of occluded left internal carotid artery terminus with 1 pass with a 4 mm x 40 mm Solitaire X retrieval device, and contact aspiration achieving a TICI 3  revascularization of the left MCA distribution. Underlying a significant atherosclerotic related stenosis treated with a 3 mm x 12 mm Onyx Frontier balloon mounted stent with significantly improved caliber and flow. Status post stent assisted angioplasty of proximal left ICA stenosis with proximal flow arrest as described without event. PLAN: Follow-up in the clinic 4 weeks post discharge. Electronically Signed   By: Julieanne Cotton M.D.   On: 02/02/2023 07:58   ECHOCARDIOGRAM COMPLETE  Result Date: 02/01/2023    ECHOCARDIOGRAM REPORT   Patient Name:   Grant Berry Date of Exam: 02/01/2023 Medical Rec #:  [id number]          Height:       67.0 in Accession #:    [id number]         Weight:  119.3 lb Date of Birth:  1946-02-11          BSA:          1.623 m Patient Age:    77 years           BP:           172/87 mmHg Patient Gender: M                  HR:           92 bpm. Exam Location:  Inpatient Procedure: 2D Echo, Cardiac Doppler and Color Doppler Indications:    Stroke  History:        Patient has prior history of Echocardiogram examinations, most                 recent 09/02/2019. Stroke; Risk Factors:Hypertension and                 Dyslipidemia.  Sonographer:    Milda Smart Referring Phys: [id number] [facility]  Sonographer Comments: Suboptimal parasternal window. Image acquisition challenging due to patient body habitus and Image acquisition challenging due to respiratory motion. IMPRESSIONS  1. Left ventricular ejection fraction, by estimation, is 60 to 65%. The left ventricle has normal function. The left ventricle has no regional wall motion abnormalities. Left ventricular diastolic parameters are consistent with Grade I diastolic dysfunction (impaired relaxation).  2. Right ventricular systolic function is normal. The right ventricular size is normal.  3. The mitral valve is abnormal. No evidence of mitral valve regurgitation. No evidence of mitral stenosis.  4. The aortic valve was  not well visualized. There is mild calcification of the aortic valve. There is mild thickening of the aortic valve. Aortic valve regurgitation is not visualized. Aortic valve sclerosis is present, with no evidence of aortic valve  stenosis.  5. The inferior vena cava is normal in size with greater than 50% respiratory variability, suggesting right atrial pressure of 3 mmHg. FINDINGS  Left Ventricle: Left ventricular ejection fraction, by estimation, is 60 to 65%. The left ventricle has normal function. The left ventricle has no regional wall motion abnormalities. The left ventricular internal cavity size was normal in size. There is  no left ventricular hypertrophy. Left ventricular diastolic parameters are consistent with Grade I diastolic dysfunction (impaired relaxation). Right Ventricle: The right ventricular size is normal. No increase in right ventricular wall thickness. Right ventricular systolic function is normal. Left Atrium: Left atrial size was normal in size. Right Atrium: Right atrial size was normal in size. Pericardium: There is no evidence of pericardial effusion. Mitral Valve: The mitral valve is abnormal. There is mild thickening of the mitral valve leaflet(s). There is mild calcification of the mitral valve leaflet(s). Mild mitral annular calcification. No evidence of mitral valve regurgitation. No evidence of mitral valve stenosis. MV peak gradient, 5.8 mmHg. The mean mitral valve gradient is 3.0 mmHg. Tricuspid Valve: The tricuspid valve is normal in structure. Tricuspid valve regurgitation is not demonstrated. No evidence of tricuspid stenosis. Aortic Valve: The aortic valve was not well visualized. There is mild calcification of the aortic valve. There is mild thickening of the aortic valve. Aortic valve regurgitation is not visualized. Aortic valve sclerosis is present, with no evidence of aortic valve stenosis. Pulmonic Valve: The pulmonic valve was normal in structure. Pulmonic valve  regurgitation is not visualized. No evidence of pulmonic stenosis. Aorta: The aortic root is normal in size and structure. Venous: The inferior vena cava is normal  in size with greater than 50% respiratory variability, suggesting right atrial pressure of 3 mmHg. IAS/Shunts: No atrial level shunt detected by color flow Doppler.  LEFT VENTRICLE PLAX 2D LVIDd:         4.40 cm   Diastology LVIDs:         3.20 cm   LV e' medial:    6.20 cm/s LV PW:         0.80 cm   LV E/e' medial:  13.4 LV IVS:        0.60 cm   LV e' lateral:   7.94 cm/s LVOT diam:     2.50 cm   LV E/e' lateral: 10.5 LVOT Area:     4.91 cm  RIGHT VENTRICLE             IVC RV S prime:     17.80 cm/s  IVC diam: 1.40 cm TAPSE (M-mode): 2.5 cm LEFT ATRIUM             Index        RIGHT ATRIUM           Index LA diam:        3.30 cm 2.03 cm/m   RA Area:     11.70 cm LA Vol (A2C):   19.1 ml 11.77 ml/m  RA Volume:   26.80 ml  16.51 ml/m LA Vol (A4C):   27.2 ml 16.76 ml/m LA Biplane Vol: 23.8 ml 14.66 ml/m   AORTA Ao Root diam: 3.60 cm Ao Asc diam:  3.40 cm MITRAL VALVE MV Area (PHT): 1.73 cm     SHUNTS MV Peak grad:  5.8 mmHg     Systemic Diam: 2.50 cm MV Mean grad:  3.0 mmHg MV Vmax:       1.20 m/s MV Vmean:      83.8 cm/s MV Decel Time: 438 msec MV E velocity: 83.30 cm/s MV A velocity: 108.00 cm/s MV E/A ratio:  0.77 Charlton Haws MD Electronically signed by Charlton Haws MD Signature Date/Time: 02/01/2023/3:01:07 PM    Final    MR BRAIN WO CONTRAST  Result Date: 02/01/2023 CLINICAL DATA:  Stroke and embolectomy. EXAM: MRI HEAD WITHOUT CONTRAST TECHNIQUE: Multiplanar, multiecho pulse sequences of the brain and surrounding structures were obtained without intravenous contrast. COMPARISON:  Head CT and MRI from yesterday FINDINGS: Brain: Moderate acute infarct in the left occipital cortex, increased from yesterday. Punctate acute infarct in the superior left frontal white matter since prior. There are areas of low-grade restricted diffusion in the  left periatrial white matter and right occipital pole attributed to subacute ischemia. Subarachnoid hemorrhage along the anterior left frontal convexity, distinct from any areas of acute infarction and known from prior head CT. Pre-existing bilateral cerebellar and brainstem small infarcts. Chronic lacunar infarcts in theright caudate. Ischemic gliosis is mild. 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 Generalized brain atrophy. Vascular: No flow seen in the left vertebral artery. There is a basilar flow void. Skull and upper cervical spine: No focal marrow lesion Sinuses/Orbits: No acute finding IMPRESSION: 1. Moderate acute infarct in the left occipital lobe with mild progression from yesterday. Interval punctate acute infarct in the left frontal white matter. 2. Areas of chronic and subacute ischemia preferentially affecting the posterior circulation. 3. Known subarachnoid hemorrhage at the anterior left frontal convexity, stable from preceding head CT. Electronically Signed   By: Tiburcio Pea M.D.   On: 02/01/2023 04:58   CT HEAD WO  CONTRAST ( )  Result Date: 01/31/2023 CLINICAL DATA:  Stroke, follow-up; post canrelor infusion EXAM: CT HEAD WITHOUT CONTRAST TECHNIQUE: Contiguous axial images were obtained from the base of the skull through the vertex without intravenous contrast. RADIATION DOSE REDUCTION: This exam was performed according to the departmental dose-optimization program which includes automated exposure control, adjustment of the Berry and/or kV according to patient size and/or use of iterative reconstruction technique. COMPARISON:  CT head 10/01/2022 and intra procedural CT 10/01/2022 FINDINGS: Brain: Hyperdense material in the left frontal sulci (series 5, image 47 and 60 and series 3, images 15-18 and 25 area and series 3, image 25), which may represent subarachnoid hemorrhage versus contrast staining. This was not apparent on the immediate  postprocedural CT head. Redemonstrated cytotoxic edema in the in the left occipital lobe (series 3, image 14), which is slightly more prominent than on the prior exam. More heterogeneous hypodensity in the right occipital lobe, which is favored to be chronic remote infarcts in the pons and bilateral cerebellar hemispheres. No evidence of acute infarction, hemorrhage, mass, mass effect, or midline shift. No hydrocephalus or extra-axial fluid collection. Vascular: No hyperdense vessel. Atherosclerotic calcifications in the intracranial carotid and vertebral arteries. Skull: Negative for fracture or focal lesion. Sinuses/Orbits: Mucosal thickening in the ethmoid air cells. No acute finding in the orbits. Status post bilateral lens replacements. Other: The mastoid air cells are well aerated. IMPRESSION: 1. Hyperdense material in the left frontal sulci, which may represent subarachnoid hemorrhage versus contrast staining. This was not apparent on the immediate postprocedural CT head. Recommend short-term follow-up head CT. 2. Redemonstrated cytotoxic edema in the left occipital lobe, which is slightly more prominent than on the prior exam. These results will be called to the ordering clinician or representative by the Radiologist Assistant, and communication documented in the PACS or [employer]. Electronically Signed   By: Wiliam Ke M.D.   On: 01/31/2023 20:52   CT CEREBRAL PERFUSION W CONTRAST  Result Date: 01/31/2023 CLINICAL DATA:  Code stroke. 77 year old male. CTA yesterday demonstrating left ICA terminus, distal left vertebral artery, Basilar artery occlusions. EXAM: CT PERFUSION BRAIN TECHNIQUE: Multiphase CT imaging of the brain was performed following IV bolus contrast injection. Subsequent parametric perfusion maps were calculated using RAPID software. RADIATION DOSE REDUCTION: This exam was performed according to the departmental dose-optimization program which includes automated exposure  control, adjustment of the Berry and/or kV according to patient size and/or use of iterative reconstruction technique. CONTRAST:  40mL OMNIPAQUE IOHEXOL 350 MG/ML SOLN COMPARISON:  Head CTs, CTA head and neck, and brain MRI earlier today. FINDINGS: CT Brain Perfusion Findings: CBF (<30%) Volume: 0mL. No CBF or CBV parameter abnormality is detected. Perfusion (Tmax>6.0s) volume: . Fairly symmetric abnormal T-max throughout the bilateral cerebellum, posterior cerebral hemispheres. T-max > 4 seconds is more pronounced in the left cerebral hemisphere including the left MCA territory. There is volume of minimal T-max > 10 seconds in the right posterior circulation. Mismatch Volume: Infarct Core: No infarct core is detected Infarction Location:Oligemia throughout the posterior circulation, to a lesser extent the left MCA territory. IMPRESSION: Oligemia throughout the bilateral posterior circulation, including the posterior fossa. Less pronounced oligemia in the Left MCA territory. No infarct core, CBF or CBV parameter abnormality detected by CTP. These results were communicated to Dr. Roda Shutters at 8:12 am on 01/31/2023 by text page via the [facility] messaging system. Electronically Signed   By: Odessa Fleming M.D.   On: 01/31/2023 08:12   CT HEAD CODE STROKE  WO CONTRAST  Result Date: 01/31/2023 CLINICAL DATA:  Code stroke. 77 year old male. CTA yesterday demonstrating left ICA terminus, distal left vertebral artery, Basilar artery occlusions. EXAM: CT HEAD WITHOUT CONTRAST TECHNIQUE: Contiguous axial images were obtained from the base of the skull through the vertex without intravenous contrast. RADIATION DOSE REDUCTION: This exam was performed according to the departmental dose-optimization program which includes automated exposure control, adjustment of the Berry and/or kV according to patient size and/or use of iterative reconstruction technique. COMPARISON:  Brain MRI 0244 hours today. CTA head and neck 0056 hours today. Plain  head CT 0050 hours today. FINDINGS: Brain: Left occipital pole cytotoxic edema is slightly more conspicuous on CT now. Heterogeneous right occipital pole redemonstrated. Chronic left central pontine and bilateral cerebellar infarcts on MRI appears stable by CT. No acute intracranial hemorrhage identified. No new cytotoxic edema compared to the earlier MRI. No midline shift, mass effect, or evidence of intracranial mass lesion. No ventriculomegaly. Vascular: Extensive Calcified atherosclerosis at the skull base. See also CTA earlier today. Skull: Stable and intact. Sinuses/Orbits: Visualized paranasal sinuses and mastoids are clear. Other: No acute orbit or scalp soft tissue finding. ASPECTS [facility] Stroke Program Early CT Score) Total score (0-10 with 10 being normal): 10; occipital pole cytotoxic edema. IMPRESSION: 1. Stable non contrast CT appearance of the brain since 0050 hours today: Occipital pole Cytotoxic edema stable from the MRI at 0242 hours today. No new edema, no intracranial hemorrhage, or mass effect. 2. 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 Electronically Signed   By: Odessa Fleming M.D.   On: 01/31/2023 08:08   MR BRAIN WO CONTRAST  Result Date: 01/31/2023 CLINICAL DATA:  Dizziness and gait instability. Large vessel occlusion. EXAM: MRI HEAD WITHOUT CONTRAST TECHNIQUE: Multiplanar, multiecho pulse sequences of the brain and surrounding structures were obtained without intravenous contrast. COMPARISON:  CTA head neck 01/31/2023 Brain MRI 09/01/2019 FINDINGS: Brain: There are areas of acute/early subacute ischemia within both occipital lobes, left-greater-than-right. These areas correspond to the areas of infarction seen on the earlier noncontrast head CT (suggested to be chronic at that time). There is no acute ischemia of the brainstem, cerebellum or anterior circulation. There is multifocal hyperintense T2-weighted signal within the white matter. Generalized volume loss. There is  hyperintense T2-weighted signal that corresponds to both sites of infarction. There is a component of chronic infarct within the superior right occipital lobe (series 11, image 25). There are multiple old cerebellar infarcts. The midline structures are normal. Vascular: Abnormal distal basilar artery flow void in keeping with known occlusion. Abnormal flow void at the skull base left ICA covers a longer segment than the demonstrated occlusion on the earlier CTA, likely due to inhibited upstream flow. Skull and upper cervical spine: Normal calvarium and skull base. Visualized upper cervical spine and soft tissues are normal. Sinuses/Orbits:No paranasal sinus fluid levels or advanced mucosal thickening. No mastoid or middle ear effusion. Normal orbits. IMPRESSION: 1. Acute/early subacute infarcts within both occipital lobes, left-greater-than-right. 2. No brainstem, cerebellar or anterior circulation acute ischemia. 3. Abnormal skull base flow voids in keeping with known vascular and left ICA occlusions. 4. Old superior right occipital and bilateral cerebellar infarcts. Electronically Signed   By: Deatra Robinson M.D.   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 There is reconstitution of the  basilar tip with patency of both PCAs maintained. The superior cerebellar arteries are occluded at their origin. Please note that this addendum was previously mistakenly associated with the noncontrast head CT for this patient performed on the same day. Electronically Signed   By: Deatra Robinson M.D.   On: 01/31/2023 02:45   Result Date: 01/31/2023 CLINICAL DATA:  Fall with dysarthria and dizziness EXAM: CT ANGIOGRAPHY HEAD AND NECK WITH AND WITHOUT CONTRAST TECHNIQUE: Multidetector CT imaging of the head and neck was performed using the standard  protocol during bolus administration of intravenous contrast. Multiplanar CT image reconstructions and MIPs were obtained to evaluate the vascular anatomy. Carotid stenosis measurements (when applicable) are obtained utilizing NASCET criteria, using the distal internal carotid diameter as the denominator. RADIATION DOSE REDUCTION: This exam was performed according to the departmental dose-optimization program which includes automated exposure control, adjustment of the Berry and/or kV according to patient size and/or use of iterative reconstruction technique. CONTRAST:  75mL OMNIPAQUE IOHEXOL 350 MG/ML SOLN COMPARISON:  None Available. FINDINGS: CTA NECK FINDINGS SKELETON: No acute abnormality or high grade bony spinal canal stenosis. OTHER NECK: Normal pharynx, larynx and major salivary glands. No cervical lymphadenopathy. Unremarkable thyroid gland. UPPER CHEST: No pneumothorax or pleural effusion. No nodules or masses. AORTIC ARCH: There is calcific atherosclerosis of the aortic arch. Conventional 3 vessel aortic branching pattern. RIGHT CAROTID SYSTEM: No dissection, occlusion or aneurysm. There is mixed density atherosclerosis extending into the proximal ICA, resulting in less than 50% stenosis. LEFT CAROTID SYSTEM: No dissection, occlusion or aneurysm. There is mixed density atherosclerosis extending into the proximal ICA, resulting in 60% stenosis. VERTEBRAL ARTERIES: Right dominant configuration. Diminutive left vertebral artery with probable short segment occlusion of the V3 segment. Normal right vertebral artery to the skull base. CTA HEAD FINDINGS POSTERIOR CIRCULATION: Right vertebral artery atherosclerosis with moderate stenosis. Normal left V4 segment. No proximal occlusion of the anterior or inferior cerebellar arteries. Basilar artery is normal. Superior cerebellar arteries are normal. Posterior cerebral arteries are normal. ANTERIOR CIRCULATION: The left ICA is occluded at its terminus within occluded  length of approximately 9 mm. The right ICA is mildly atherosclerotic without stenosis. Anterior cerebral arteries are normal. Moderate multifocal stenosis of the M1 segment of the right MCA. The left MCA is patent, likely filled by collateral flow along the circle-of-Willis. There is multifocal mild atherosclerotic irregularity without high-grade stenosis. VENOUS SINUSES: As permitted by contrast timing, patent. ANATOMIC VARIANTS: None Review of the MIP images confirms the above findings. IMPRESSION: 1. 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 The left MCA is patent, likely filled by collateral flow along the circle-of-Willis. 2. Moderate multifocal stenosis of the M1 segment of the right MCA. 3. Moderate right vertebral artery stenosis. 4. Bilateral carotid bifurcation atherosclerosis with 60% stenosis of the proximal left ICA. 5. Diminutive left vertebral artery with probable short segment occlusion of the V3 segment. Aortic Atherosclerosis (ICD10-I70.0). Critical Value/emergent results were called by telephone at the time of interpretation on 01/31/2023 at 1:17 am to provider [facility] , who verbally acknowledged these results. Electronically Signed: By: Deatra Robinson M.D. 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 There is reconstitution of the basilar tip with patency of both PCAs maintained. The superior cerebellar arteries are occluded at  their origin. Electronically Signed   By: Deatra Robinson M.D.   On: 01/31/2023 01:30   Result Date: 01/31/2023 CLINICAL DATA:  Code stroke.  Fall with dizziness EXAM: CT HEAD WITHOUT CONTRAST TECHNIQUE: Contiguous axial images were obtained from the base of the skull through the vertex without intravenous contrast. RADIATION  DOSE REDUCTION: This exam was performed according to the departmental dose-optimization program which includes automated exposure control, adjustment of the Berry and/or kV according to patient size and/or use of iterative reconstruction technique. COMPARISON:  None Available. FINDINGS: Brain: There is no mass, hemorrhage or extra-axial collection. The size and configuration of the ventricles and extra-axial CSF spaces are normal. There are multiple old cerebellar small vessel infarcts. Bilateral PCA territory infarctions also appear chronic. There is mild white matter hypoattenuation. Vascular: Atherosclerosis of the carotid and vertebral arteries at the skull base. Skull: Normal Sinuses/Orbits: No fluid levels or advanced mucosal thickening of the visualized paranasal sinuses. No mastoid or middle ear effusion. The orbits are normal. ASPECTS [facility] Stroke Program Early CT Score) - Ganglionic level infarction (caudate, lentiform nuclei, internal capsule, insula, M1-M3 cortex): 7 - Supraganglionic infarction (M4-M6 cortex): 3 Total score (0-10 with 10 being normal): 10 IMPRESSION: 1. No acute intracranial abnormality. 2. ASPECTS is 10. 3. 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 4. Bilateral PCA territory infarctions also appear chronic. These results were called by telephone at the time of interpretation on 01/31/2023 at 1:00 am to provider [facility] , who verbally acknowledged these results. Electronically Signed: By: Deatra Robinson M.D. On: 01/31/2023 01:00   CT C-SPINE NO CHARGE  Result Date: 01/31/2023 CLINICAL DATA:  Neck pain, known left ICA occlusion EXAM: CT CERVICAL SPINE WITHOUT CONTRAST TECHNIQUE: Multidetector CT imaging of the cervical spine was performed without intravenous contrast. Multiplanar CT image reconstructions were also generated. RADIATION DOSE REDUCTION: This exam was performed according to the departmental dose-optimization program which includes automated exposure control,  adjustment of the Berry and/or kV according to patient size and/or use of iterative reconstruction technique. COMPARISON:  None Available. FINDINGS: Alignment: Within normal limits. Skull base and vertebrae: 7 cervical segments are well visualized. Vertebral body height is well maintained. Mild osteophytic change and facet hypertrophic changes noted. No acute fracture or acute facet abnormality is noted. The odontoid is within normal limits. Soft tissues and spinal canal: Surrounding soft tissue structures appear within normal limits. Upper chest: Visualized lung apices are unremarkable with the exception of some scarring in the right apex. Other: None IMPRESSION: Multilevel degenerative change without acute bony abnormality. Electronically Signed   By: Alcide Clever M.D.   On: 01/31/2023 01:25     PHYSICAL EXAM  Temp:  [98.3 F (36.8 C)-99.3 F (37.4 C)] 98.3 F (36.8 C) (12/10 1055) Pulse Rate:  [76-115] 76 (12/10 0419) Resp:  [18-20] 18 (12/10 1055) BP: (130-152)/(67-85) 130/73 (12/10 1055) SpO2:  [95 %-100 %] 96 % (12/10 1055)  General - Well nourished, well developed, not in acute distress, mild restless.  Ophthalmologic - fundi not visualized due to noncooperation.  Cardiovascular - Regular rhythm and rate..  Neuro - awake, alert, eyes open, orientated to place, but not on age or time. No aphasia, fluent words with pressure speech, perseveration, and severe dysarthria, but following most simple commands. Able to repeat but not cooperative with naming. No gaze palsy, tracking bilaterally, visual field test showed right upper quadrantanopia, PERRL. No facial droop. Right facial dressing for wound from fall. Tongue midline. Bilateral UEs 4/5, no drift. Bilaterally LEs  3/5 proximal and 4/5 distally, no drift. Sensation symmetrical bilaterally, chronic left UE ataxia and left LE dysmetria, gait not tested.     ASSESSMENT/PLAN Grant Berry is a 77 y.o. male with history of  hypertension, hyperlipidemia, stroke admitted for multiple falls. No tPA given due to outside window.    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  Left ICA proximal 60% stenosis, right multifocal M1 moderate stenosis, right VA stenosis. MRI left MCA/PCA small to moderate infarct.  Old right PCA infarct. Due to developing expressive aphasia, repeat CT showed no acute change CTP no core infarct, increased TTP at posterior circulation and left MCA territory Status post IR with terminal ICA stenting and proximal ICA angioplasty with TICI3 reperfusion Post IR CT repeat left PCA progressive infarct. Hyperdense material in the left frontal sulci, which may represent subarachnoid hemorrhage versus contrast staining. MRI repeat Moderate acute infarct in the left occipital lobe with mild progression from yesterday. Interval punctate acute infarct in the left frontal white matter. 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 Bilateral P-comm and PCA patent Avoid low BP No intervention needed at this time, discussed with Dr. Corliss Skains  History of stroke 08/2019 admitted for dizziness, imbalance and leaning towards to the left.  CT showed left cerebellar infarct which confirmed on MRI.  MRI showed left SCA occlusion,  left VA hypoplastic.  2D echo unremarkable.  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  Discharged on DAPT and Lipitor. Follow-up with Dr. Pearlean Brownie at [facility]  Delirium  Overnight restless, pulling things Sitter ordered Worsening dysarthria with perseveration and restless Will put on seroquel 25 bid  Hypertension Stable now S/p albumin Avoid low BP Long term BP goal normotensive  Hyperlipidemia Home meds: Zetia LDL 167, goal < 70 Statin intolerance (Lipitor and pravastatin) Continue Zetia at discharge Will need to consider Leqvio  Other Stroke Risk Factors Advanced age Former smoker  Other Active Problems AKI, creatinine 1.40--1.06--1.07  Hospital day # 2  Patient developed hospital delirium, needed extensive care. I spent extensive face-to-face time with the patient, more than 50% of which was spent in counseling and coordination of care, reviewing test results, images and medication, and discussing the diagnosis, treatment plan and potential prognosis. This patient's care requiresreview of multiple databases, neurological assessment, discussion with family, other specialists and medical decision making of high complexity.    Marvel Plan, MD PhD Stroke Neurology 02/02/2023 2:31 PM    To contact Stroke Continuity provider, please refer to [email or web address]. After hours, contact General Neurology

## 2023-02-02 NOTE — Evaluation (Signed)
Occupational Therapy Evaluation Patient Details Name: Grant Berry MRN: [id number] DOB: Feb 19, 1946 Today's Date: 02/02/2023   History of Present Illness Pt is a 77 y.o. male who presented 01/31/23 s/p multiple falls.  MRI showed left MCA/PCA infarct, chronic right PCA infarct.  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 S/p bilat common carotid arteriograms R CFA approach, complete revascularization of L ICA supraclinoid segment, treated intracranial arteriosclerotic related stenosis, angioplasty of high grade L ICA proximal stenosis 12/8. PMH: L MCA CVA, HLD, HTN   Clinical Impression   Pt admitted with L MCA/PCA. Pt currently with functional limitations due to the deficits listed below (see OT Problem List). Prior to admit, pt was living with his son at home and and Mod I for basic ADL tasks and functional mobility. Patient will benefit from intensive inpatient follow up therapy, >3 hours/day. Pt will benefit from acute skilled OT to increase their safety and independence with ADL and functional mobility for ADL to facilitate discharge. OT will continue to follow patient acutely.         If plan is discharge home, recommend the following: Two people to help with walking and/or transfers;A lot of help with bathing/dressing/bathroom;Assistance with feeding;Direct supervision/assist for medications management;Direct supervision/assist for financial management;Assist for transportation;Help with stairs or ramp for entrance;Supervision due to cognitive status    Functional Status Assessment  Patient has had a recent decline in their functional status and demonstrates the ability to make significant improvements in function in a reasonable and predictable amount of time.  Equipment Recommendations  Other (comment) (defer to next level of care)    Recommendations for Other Services Rehab consult      Precautions / Restrictions Precautions Precautions: Fall Restrictions Weight Bearing Restrictions: No      Mobility Bed Mobility Overal bed mobility: Needs Assistance Bed Mobility: Rolling, Supine to Sit, Sit to Supine Rolling: Min assist   Supine to sit: Min assist, HOB elevated, Used rails Sit to supine: Min assist   General bed mobility comments: Physical assist to initiate bringing legs off EOB to transition to sitting. Assist to bring trunk off HOB. VC provided for sequencing and safety.    Transfers Overall transfer level: Needs assistance Equipment used: 1 person hand held assist Transfers: Sit to/from Stand Sit to Stand: Mod assist           General transfer comment: 1 person face to face hand held assist provided while pt stood at EOB while NT changed fitted sheet. Pt was able to maintain standing balance with Min-Mod A for ~2-3 minutes before verbalizing need to sit. Prior to sitting, pt performed several lateral steps to the right towards HOB. OT provided VC for sequencing and coordination while providing increased assist to maintain balance.      Balance Overall balance assessment: Needs assistance, History of Falls Sitting-balance support: Bilateral upper extremity supported, Feet supported Sitting balance-Leahy Scale: Poor Sitting balance - Comments: sitting EOB, CGA provided for safety during balance Postural control: Posterior lean Standing balance support: Bilateral upper extremity supported Standing balance-Leahy Scale: Poor Standing balance comment: Reliant on support from OT to maintain standing balance. Pt demonstrated increased weight into heels while calves were using bed as support.        ADL either performed or assessed with clinical judgement   ADL Overall ADL's : Needs assistance/impaired            General ADL Comments:  At this time, pt is requiring max-total A at bed level to complete all BADL tasks due to cognition.     Vision  Baseline Vision/History: 1 Wears glasses Ability to See in Adequate Light: 0 Adequate Patient Visual Report: Other (comment) (Pt commented that he can see "some" through his right eye when asked about changes in vision) Vision Assessment?:  (Unable to formally assess due to cognition and difficulty with direction following)     Perception Perception: Impaired Preception Impairment Details: Spatial orientation     Praxis Praxis: Impaired Praxis Impairment Details: Motor planning, Organization     Pertinent Vitals/Pain Pain Assessment Pain Assessment: Faces Faces Pain Scale: Hurts a little bit Pain Location: generalized pain noted with movement and mobility Pain Intervention(s): Monitored during session     Extremity/Trunk Assessment Upper Extremity Assessment Upper Extremity Assessment: Generalized weakness;Right hand dominant (Unable to formally assess strength and ROM although pt did freely move BUE and use functionally during evaluation)   Lower Extremity Assessment Lower Extremity Assessment: Defer to PT evaluation;Generalized weakness   Cervical / Trunk Assessment Cervical / Trunk Assessment: Kyphotic   Communication Communication Communication: Difficulty following commands/understanding;Difficulty communicating thoughts/reduced clarity of speech Cueing Techniques: Verbal cues;Tactile cues   Cognition Arousal: Alert Behavior During Therapy: Flat affect Overall Cognitive Status: Impaired/Different from baseline Area of Impairment: Orientation, Attention, Memory, Following commands, Safety/judgement, Problem solving, Awareness        Orientation Level: Disoriented to, Time, Situation   Memory: Decreased recall of precautions, Decreased short-term memory Following Commands: Follows one step commands with increased time Safety/Judgement: Decreased awareness of safety, Decreased awareness of deficits   Problem Solving: Slow processing, Decreased initiation, Difficulty  sequencing, Requires verbal cues, Requires tactile cues General Comments: Pt dysarthric and had increased difficulty with communication     General Comments  Multiple brusies on arms and legs. Bruising on right side of face with bandage on. Some old blood noted from foley around penis with NT reporting that he has been pulling at it.            Home Living Family/patient expects to be discharged to:: Private residence Living Arrangements: Children (son lives with him, works 2nd shift at a nursing home) Available Help at Discharge: Family Type of Home: House Home Access: Stairs to enter [employer] of Steps: 3 in back, 4 in front Entrance Stairs-Rails: Right;Left;Can reach both Home Layout: One level     Bathroom Shower/Tub: Producer, television/film/video: Handicapped height     Home Equipment: Agricultural consultant (2 wheels);Rollator (4 wheels)      Lives With: Son    Prior Functioning/Environment Prior Level of Function : Independent/Modified Independent;History of Falls (last six months);Patient poor historian/Family not available             Mobility Comments: pt endorses using a RW per PT eval ADLs Comments: Mod I        OT Problem List: Decreased strength;Decreased activity tolerance;Impaired balance (sitting and/or standing);Impaired vision/perception;Decreased coordination;Decreased cognition;Decreased safety awareness;Impaired UE functional use      OT Treatment/Interventions: Self-care/ADL training;Therapeutic exercise;Neuromuscular education;DME and/or AE instruction;Energy conservation;Manual therapy;Modalities;Therapeutic activities;Cognitive remediation/compensation;Visual/perceptual remediation/compensation;Patient/family education;Balance training    OT Goals(Current goals can be found in the care plan section) Acute Rehab OT Goals Patient Stated Goal: to lay down OT Goal Formulation: Patient unable to participate in goal setting Time For  Goal Achievement: 02/16/23 Potential to Achieve Goals: Good  OT Frequency: Min 1X/week       AM-PAC OT "6 Clicks"  Daily Activity     Outcome Measure Help from another person eating meals?: A Lot Help from another person taking care of personal grooming?: A Lot Help from another person toileting, which includes using toliet, bedpan, or urinal?: Total Help from another person bathing (including washing, rinsing, drying)?: Total Help from another person to put on and taking off regular upper body clothing?: Total Help from another person to put on and taking off regular lower body clothing?: Total 6 Click Score: 8   End of Session Equipment Utilized During Treatment: Gait belt  Activity Tolerance: Patient tolerated treatment well;Other (comment) (limited by cognition) Patient left: in bed;with bed alarm set;with nursing/sitter in room  OT Visit Diagnosis: Unsteadiness on feet (R26.81);Repeated falls (R29.6);Muscle weakness (generalized) (M62.81);History of falling (Z91.81);Ataxia, unspecified (R27.0);Other symptoms and signs involving cognitive function                Time: 4098-1191 OT Time Calculation (min): 14 min Charges:  OT General Charges $OT Visit: 1 Visit OT Evaluation $OT Eval Moderate Complexity: 1 Mod  [employer], OTR/L,CBIS  Supplemental OT - [facility] and [facility] Secure Chat Preferred    Zaeda Mcferran, Charisse March 02/02/2023, 12:38 PM

## 2023-02-02 NOTE — Progress Notes (Addendum)
Inpatient Rehab Admissions Coordinator:   Spoke to son on phone and will plan to meet at bedside this afternoon.    1607: I met with patient and son/granddaughter at bedside to discuss CIR recommendations and goals/expectations of CIR stay.  We reviewed 3 hrs/day of therapy, physician follow up, and average length of stay 2 weeks (dependent upon progress) with goals of 24/7 supervision to min assist.  Son/grand daughter both work and pt would be alone during a significant portion of the day.  Based on therapy documentation and today's PMR consult, I'm not confident we could achieve a safe level of mobility for pt to d/c home without constant supervision in 2-3 weeks.  Son is familiar with rehab at [facility] and also mentioned [facility] on [address].  I passed along to TOC.  I will see how he does with therapy tomorrow to gauge progress, but he will most likely need SNF placement when he's ready to discharge.     Estill Dooms, PT, DPT Admissions Coordinator [phone] 02/02/23  2:46 PM

## 2023-02-02 NOTE — Evaluation (Signed)
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   Statin intolerance 01/08/2022   History of cerebrovascular accident (CVA) with residual deficit 04/23/2020   Seasonal allergies 04/23/2020   Primary hypertension 06/17/2012   Mixed hyperlipidemia 06/17/2012   Past Medical History:  Past Medical History:  Diagnosis Date   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   Hyperlipidemia    Hypertension    Past Surgical History:  Past Surgical History:  Procedure Laterality Date   EYE SURGERY Bilateral    IR CT HEAD LTD  01/31/2023   IR INTRA CRAN STENT  01/31/2023   IR INTRAVSC STENT CERV CAROTID W/O EMB-PROT MOD SED INC ANGIO  01/31/2023   IR PERCUTANEOUS ART THROMBECTOMY/INFUSION INTRACRANIAL INC DIAG ANGIO  01/31/2023   RADIOLOGY WITH ANESTHESIA N/A 01/31/2023   Procedure: IR WITH ANESTHESIA;  Surgeon: Radiologist, Medication, MD;  Location: [facility] OR;  Service: Radiology;  Laterality: N/A;   TONSILECTOMY/ADENOIDECTOMY WITH MYRINGOTOMY     HPI:  Grant Berry is a 77 yo male presenting to ED 12/8 with multiple falls. MRI Brain showed L MCA/PCA infarct, chronic R PCA infarct. CTA Head and Neck with mid-basilar artery occlusion, L ICA 60% stenosis, L ICA terminal occlusion vs high-grade stenosis. Underwent bilat common carotid arteriograms R CFA approach, complete revascularization of L ICA supraclinoid segment, treated intracranial arteriosclerotic related stenosis, angioplasty of high grade L ICA proximal stenosis 12/8. PMH includes HTN, HLD, prior L MCA stroke with residual LUE and LLE ataxia.   Assessment / Plan / Recommendation Clinical Impression  Pt reports that he lives at  home with his son, who provides assistance with all medication and financial management tasks. Pt is a questionable historian so PLOF is difficult to gauge. He presents with significant motor speech errors, which are presumed to be partially chronic due to residual deficits from prior R MCA CVA. Groping affects intelligibility at the word level with ~50% intelligibility to an unfamiliar listener. Pt demonstrates deficits related to sustained attention, auditory comprehension, memory, and problem solving. Feel that pt may benefit from ongoing SLP f/u to target cognitive linguistic deficits listed above. Will continue following and recommend post-acute intensive SLP f/u.    SLP Assessment  SLP Recommendation/Assessment: Patient needs continued Speech Lanaguage Pathology Services SLP Visit Diagnosis: Dysarthria and anarthria (R47.1);Apraxia (R48.2);Cognitive communication deficit (R41.841)    Recommendations for follow up therapy are one component of a multi-disciplinary discharge planning process, led by the attending physician.  Recommendations may be updated based on patient status, additional functional criteria and insurance authorization.    Follow Up Recommendations  Acute inpatient rehab (3hours/day)    Assistance Recommended at Discharge  Frequent or constant Supervision/Assistance  Functional Status Assessment Patient has had a recent decline in their functional status and demonstrates the ability to make significant improvements in function in a reasonable and predictable amount of time.  Frequency and Duration min 2x/week  2 weeks      SLP Evaluation Cognition  Overall Cognitive Status: Impaired/Different from baseline Arousal/Alertness: Awake/alert Orientation Level: Oriented to person;Disoriented to place;Disoriented to time;Disoriented to situation Year: Other (Comment) (1974 or 1984) Attention: Sustained Sustained Attention: Impaired Sustained Attention Impairment: Verbal  basic;Functional basic Memory: Impaired Memory Impairment: Storage deficit;Retrieval deficit Awareness: Impaired Awareness  Impairment: Intellectual impairment Problem Solving: Impaired Problem Solving Impairment: Verbal basic;Functional basic       Comprehension       Expression Verbal Expression Overall Verbal Expression: Impaired Naming: Impairment Divergent: 0-24% accurate   Oral / Motor  Oral Motor/Sensory Function Overall Oral Motor/Sensory Function: Within functional limits Motor Speech Overall Motor Speech: Impaired Respiration: Within functional limits Phonation: Normal Resonance: Within functional limits Articulation: Impaired Level of Impairment: Word Intelligibility: Intelligibility reduced Word: 25-49% accurate Phrase: 0-24% accurate Motor Planning: Impaired Level of Impairment: Word Motor Speech Errors: Groping for words            Gwynneth Aliment, M.A., CF-SLP Speech Language Pathology, Acute Rehabilitation Services  Secure Chat preferred [phone]  02/02/2023, 10:28 AM

## 2023-02-02 NOTE — Progress Notes (Signed)
Patient pulled of his dressing to his left side of his face and his IV access. Bleeding noted and new dressing applied. Safety mittens also placed to prevent patient from pulling out other lines.

## 2023-02-02 NOTE — Consult Note (Signed)
Physical Medicine and Rehabilitation Consult Reason for Consult:impaired functional mobility after a stroke Referring Physician: Roda Shutters   HPI: Grant Berry is a 77 y.o. male with a history of hypertension, prior left MCA stroke, who presented on 01/31/23 with multiple falls that day.  He was initially seen in [facility] and a code stroke was called.  CT of the head without contrast noted prior left cerebellar infarcts and right PCA infarct as well as a subacute left PCA infarct.  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  He was transferred emergently to [facility] where he had an MRI without contrast demonstrating an acute left MCA/PCA infarct.  Patient underwent emergent left terminal ICA stenting and proximal ICA angioplasty by interventional radiology.  Patient was outside of the window for tPA.  Postintervention CT revealed progressive infarct in the left PCA distribution.  There is also subarachnoid hemorrhage seen on prior imaging present as well.  He appears to have had some confusion overnight, pulling on Foley and causing some bleeding.  Patient was evaluated by physical therapy yesterday and was mod assist for sit to stand transfers and took a step or 2 only due to feeling weak.  Patient lives with his son in a 1 level house with 3-4 steps to enter.  Son works second shift at nursing home.  Patient was independent prior to arrival using a rolling walker.  There was some question of falls prior to those on the day of the admission.   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   Hyperlipidemia    Hypertension    Past Surgical History:  Procedure Laterality Date   EYE SURGERY Bilateral    IR CT HEAD LTD  01/31/2023   IR INTRA CRAN STENT  01/31/2023   IR INTRAVSC STENT CERV CAROTID W/O EMB-PROT MOD SED INC ANGIO   01/31/2023   IR PERCUTANEOUS ART THROMBECTOMY/INFUSION INTRACRANIAL INC DIAG ANGIO  01/31/2023   RADIOLOGY WITH ANESTHESIA N/A 01/31/2023   Procedure: IR WITH ANESTHESIA;  Surgeon: Radiologist, Medication, MD;  Location: [facility] OR;  Service: Radiology;  Laterality: N/A;   TONSILECTOMY/ADENOIDECTOMY WITH MYRINGOTOMY     Family History  Problem Relation Age of Onset   Stroke Mother    Coronary artery disease Mother    Coronary artery disease Father    Diabetes Maternal Grandfather    Social History:  reports that he quit smoking about 34 years ago. His smoking use included cigarettes. He has never used smokeless tobacco. He reports that he does not drink alcohol and does not use drugs. Allergies:  Allergies  Allergen Reactions   Lisinopril     Dizziness   Atorvastatin Other (See Comments)    "Feels like crap", muscle pain, & dizziness.   Pravastatin Other (See Comments)    Pain   Medications Prior to Admission  Medication Sig Dispense Refill   amLODipine (NORVASC) 10 MG tablet Take 1 tablet (10 mg total) by mouth daily. 90 tablet 3   B Complex-C (B-COMPLEX WITH VITAMIN C) tablet Take 1 tablet by mouth daily. 90 tablet 1   MAGNESIUM PO Take 1 tablet by mouth in the morning and at bedtime.     Omega-3 Fatty Acids (FISH OIL PO) Take 1 capsule by mouth daily.     ezetimibe (ZETIA) 10  MG tablet Take 10 mg by mouth daily. (Patient not taking: Reported on 01/31/2023)      Home: Home Living Family/patient expects to be discharged to:: Private residence Living Arrangements: Children (son lives with him, works 2nd shift at a nursing home) Available Help at Discharge: Family Type of Home: House Home Access: Stairs to enter Secretary/administrator of Steps: 3 in back, 4 in front Entrance Stairs-Rails: Right, Left, Can reach both Home Layout: One level Bathroom Shower/Tub: Health visitor: Handicapped height Home Equipment: Agricultural consultant (2 wheels), Rollator (4 wheels)   Functional History: Prior Function Prior Level of Function : Independent/Modified Independent, History of Falls (last six months), Patient poor historian/Family not available Mobility Comments: pt endorses using a RW for "4 years, since all of this started". Pt states he has only had falls related to CVA this admission, unsure of pt accuracy ADLs Comments: pt endorses independence Functional Status:  Mobility: Bed Mobility Overal bed mobility: Needs Assistance Bed Mobility: Supine to Sit Supine to sit: Min assist General bed mobility comments: assist for trunk elevation, scooting to EOB Transfers Overall transfer level: Needs assistance Equipment used: Rolling walker (2 wheels), 1 person hand held assist Transfers: Sit to/from Stand, Bed to chair/wheelchair/BSC Sit to Stand: Mod assist Bed to/from chair/wheelchair/BSC transfer type:: Stand pivot Stand pivot transfers: Mod assist General transfer comment: assist for power up, rise, steady, and pivot to L and R. Stand x3, first attempt with RW but benefitted from [facility] instead. LEs blocked for pt safety, intermittent buckling especially on first attempt. Ambulation/Gait General Gait Details: unable - attempted steps away from bed and pt stopped after 1 step stating he was "too weak"    ADL:    Cognition: Cognition Overall Cognitive Status: Impaired/Different from baseline Orientation Level: Oriented to person, Oriented to place, Disoriented to time, Disoriented to situation Cognition Arousal: Alert Behavior During Therapy: Flat affect Overall Cognitive Status: Impaired/Different from baseline Area of Impairment: Orientation, Memory, Following commands, Safety/judgement, Problem solving Orientation Level: Disoriented to, Time Following Commands: Follows one step commands with increased time Safety/Judgement: Decreased awareness of safety, Decreased awareness of deficits Problem Solving: Slow processing, Decreased initiation,  Difficulty sequencing, Requires verbal cues, Requires tactile cues General Comments: Pt oriented to self, location, states year is 2004, and states he had a cva. Pt is a questionable historian, pt stating he typically does not fall but no family member at bedside and pt with multiple bruises body-wide at different stages of healing. Makes contradictory statements "my vision is better now" vs "my vision is blurry";  "I can see better (with L eye covered) vs "it's about the same". Pt with difficulty articulating what the names of simple objects are, watch and pen for example.  Blood pressure (!) 141/71, pulse 76, temperature 98.6 F (37 C), temperature source Oral, resp. rate 18, height 5\' 7"  (1.702 m), weight 54.1 kg, SpO2 96%. Physical Exam Constitutional:      Comments: Disheveled. Bandage over right temporal area. No distress. confused  HENT:     Mouth/Throat:     Mouth: Mucous membranes are moist.  Eyes:     Conjunctiva/sclera: Conjunctivae normal.  Cardiovascular:     Rate and Rhythm: Normal rate.  Pulmonary:     Effort: Pulmonary effort is normal.  Abdominal:     Palpations: Abdomen is soft.  Genitourinary:    Comments: Foley  in place. Blood around foley and in bed. Penis appeared untraumatized.  Musculoskeletal:     Cervical back: Normal range  of motion.  Skin:    General: Skin is warm.     Comments: Scattered abrasions and bruises in addition to what has been mentioned above.  Neurological:     Comments: Pt fairly alert. Oriented to name, "[facility]". Told it was January 1984. Very distracted. Speech dysarthric. ?right facial droop. Visual field loss to right?. Moves all 4's. At least 3-4/5 in UE's and 3/5 LE's. Sensed pain in all 4's. Dysmetria with left arm and leg. DTR's brisk on left. No abnl resting tone.   Psychiatric:     Comments: Restless and distracted     Results for orders placed or performed during the hospital encounter of 01/31/23 (from the past 24 hour(s))  Basic  metabolic panel     Status: Abnormal   Collection Time: 02/02/23  4:55 AM  Result Value Ref Range   Sodium 136 135 - 145 mmol/L   Potassium 3.4 (L) 3.5 - 5.1 mmol/L   Chloride 101 98 - 111 mmol/L   CO2 24 22 - 32 mmol/L   Glucose, Bld 101 (H) 70 - 99 mg/dL   BUN 14 8 - 23 mg/dL   Creatinine, Ser 6.57 0.61 - 1.24 mg/dL   Calcium 9.4 8.9 - 84.6 mg/dL   GFR, Estimated >96 >29 mL/min   Anion gap 11 5 - 15  CBC     Status: Abnormal   Collection Time: 02/02/23  4:55 AM  Result Value Ref Range   WBC 8.4 4.0 - 10.5 K/uL   RBC 3.68 (L) 4.22 - 5.81 MIL/uL   Hemoglobin 11.0 (L) 13.0 - 17.0 g/dL   HCT 52.8 (L) 41.3 - 24.4 %   MCV 88.3 80.0 - 100.0 fL   MCH 29.9 26.0 - 34.0 pg   MCHC 33.8 30.0 - 36.0 g/dL   RDW 01.0 27.2 - 53.6 %   Platelets 165 150 - 400 K/uL   nRBC 0.0 0.0 - 0.2 %   IR PERCUTANEOUS ART THROMBECTOMY/INFUSION INTRACRANIAL INC DIAG ANGIO  Result Date: 02/02/2023 INDICATION: History of repeated falls. Patient with new onset expressive aphasia and possible right sided weakness. 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 Also noted mid basilar artery occlusion. EXAM: 1. EMERGENT LARGE VESSEL OCCLUSION THROMBOLYSIS (anterior CIRCULATION) COMPARISON:  Recent CT angiogram of the head and neck, and MRI of the brain. MEDICATIONS: Ancef 2 g IV was administered within 1 hour of the procedure. ANESTHESIA/SEDATION: General anesthesia. CONTRAST:  Omnipaque 300 approximately 110 mL. FLUOROSCOPY TIME:  Fluoroscopy Time: 31 minutes 42 seconds (1110 mGy). COMPLICATIONS: None immediate. TECHNIQUE: Following a full explanation of the procedure along with the potential associated complications, an informed witnessed consent was obtained. The risks of intracranial hemorrhage of 10%, worsening neurological deficit, ventilator dependency, death and inability to revascularize were all reviewed in detail with the patient. The  patient was then put under general anesthesia by the Department of Anesthesiology at [facility]. The right groin was prepped and draped in the usual sterile fashion. Thereafter using modified Seldinger technique, transfemoral access into the right common femoral artery was obtained without difficulty. Over an 0.035 inch guidewire an 8 French 25 cm Pinnacle sheath was inserted. Through this, and also over an 0.035 inch guidewire a combination of a 125 cm 6 [place] Berenstein support catheter inside of an 087 95 cm balloon guide catheter was advanced to the aortic arch region and advanced to the distal left common carotid artery. The guidewire,  and the support catheter were removed. Good aspiration was obtained from the hub of the balloon guide catheter. A control arteriogram was then performed centered extra cranially and intracranially. FINDINGS: The left common carotid arteriogram demonstrates the left external carotid artery and its major branches to be widely patent. The left internal carotid artery just distal to the bulb has a focal severe stenosis. 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 PROCEDURE: Through the balloon guide catheter in the distal left ICA, and over an 014 inch 300 cm exchange micro guidewire positioned in the horizontal petrous segment, control angioplasty of the proximal left internal carotid stenosis was performed with a 4 mm x 30 cm Viatrac 14 angioplasty balloon catheter which had been prepped with 50% contrast and 50% heparinized saline infusion. Using the rapid exchange technique, the balloon was positioned with its markers adequate distant from the site of severe stenosis. A control inflation was then performed using a micro inflation syringe device via micro tubing to approximately 4 mm where it was maintained for approximately  a minute and a half. Thereafter, following deflation of the balloon and removal a control arteriogram performed through the balloon guide catheter demonstrated now improved caliber of the angioplastied segment. At this time a 071 Zoom aspiration catheter was advanced in combination with an 021 160 cm Trevo ProVue microcatheter over the exchange micro guidewire to the proximal petrous segment. The 300 cm 014 exchange micro guidewire was then exchanged for an 018 inch micro guidewire with a moderate J configuration distally. The micro guidewire with the microcatheter was then gently advanced without difficulty through the occluded supraclinoid left ICA to the middle cerebral artery inferior division M2 segment followed by the microcatheter. The guidewire was removed. Good aspiration obtained from the hub of the microcatheter. A gentle control arteriogram performed through this demonstrated safe positioning of the tip of the microcatheter. A 4 mm x 40 mm Solitaire X retrieval device was then deployed with the proximal portion in the supraclinoid left ICA. At this time the Zoom aspiration catheter was advanced without difficulty to the mid M1 segment. Control aspiration was then performed for approximately a minute and a half at the hub of the Zoom aspiration catheter, and with a 20 mL syringe at the hub of the balloon guide catheter for approximately a minute and a half. The combination was then retrieved and removed. Following reversal of flow arrest in the left internal carotid artery a control arteriogram performed demonstrated complete revascularization of the supraclinoid left ICA with opacification of the left middle cerebral artery achieving a TICI 3 revascularization. Also noted was opacification of the proximal left A1 segment with focal areas of severe stenosis due to arteriosclerosis. Bilateral PCOMs demonstrated opacification into the left posterior cerebral artery P1 segment. Underlying intracranial  arteriosclerosis with resulting severe stenosis was noted just proximal to the origins of the posterior communicating arteries. Through the balloon guide catheter in the distal left ICA, an aspiration 5 French 115 cm guide catheter was then advanced to the proximal cavernous segment over an 035 inch guidewire. Through this, 160 cm Trevo ProVue microcatheter was advanced over an 018 inch micro guidewire. The microcatheter and the micro guidewire were advanced to the distal M2 M3 regions of the inferior division followed by the microcatheter. The guidewire was removed. Good aspiration obtained from the hub  of the microcatheter. This in turn was exchanged for an 014 inch 300 cm exchange micro guidewire with a moderate J configuration at its distal end. A control arteriogram performed demonstrated safe positioning of the tip of the micro guidewire. Measurements were then performed of the left ICA supraclinoid segment just proximal to the bifurcation and just distal to the origin of the ophthalmic artery. A 3 mm x 12 mm Onyx Frontier balloon mounted stent which had been prepped antegradely with 50% contrast and 50% heparinized saline infusion, and retrogradely with heparinized saline infusion, was advanced in a coaxial manner and with constant heparinized saline infusion and positioned without difficulty with the stent markers adequate distant from the area of severe stenosis. A control inflation was then performed using a micro inflation syringe device via micro tubing to just over 3 mm x 2 where it was maintained for approximately a minute and a half. Following balloon deflation and retrieval and removal, a control arteriogram performed through the 5 [place] intermediate catheter demonstrated significantly improved caliber of the supraclinoid left ICA with a TICI 3 revascularization of the left MCA being persistent. Again demonstrated was the stenosed left anterior cerebral A1 segment. Over the exchange micro guidewire  now in the proximal cavernous segment, a control arteriogram performed through the balloon guide catheter following removal of the intermediate catheter. Measurements were then performed of the left internal carotid artery proximally at the site of the significant stenosis and the distal left common carotid artery. A 6/8 mm x 40 mm Xact stent delivery apparatus was then advanced in a coaxial manner and with constant heparinized saline infusion and positioned without difficulty with the adequate coverage distal to the angioplastied segment and into the distal left common carotid artery. The stent was then deployed in the usual manner without any difficulty. The delivery apparatus was removed. A control arteriogram performed through the balloon guide catheter demonstrated significantly improved caliber through the stented segment of the proximal left ICA. The waist at the site of the angioplasty was then treated with a 5 mm x 30 mm Viatrac angioplasty microcatheter which was advanced after its preparation as described above. Prior to the angioplasty, proximal flow arrest was initiated by inflating the balloon in the distal left common carotid artery, and with proximal aspiration during the angioplasty. Following the angioplasty, the balloon was retrieved and removed while aspiration was maintained as flow arrest was reversed. Control arteriograms were then performed through the balloon guide catheter over the next 10-20 minutes continued to demonstrate excellent flow through the centered in the proximal left ICA. Intracranially no change was seen in the TICI 3 revascularization of the left middle cerebral artery distribution. A 5 French diagnostic catheter was then advanced into the right common carotid artery over an 035 inch guidewire. The right common carotid artery injection demonstrated patency of the right internal carotid artery at the neck, and intracranially. The petrous, the cavernous and the supraclinoid  right ICA demonstrate wide patency. The right middle cerebral artery and the right anterior cerebral artery demonstrate wide patency into the capillary and venous phases with a 50% stenosis of the right middle cerebral artery M1 segment. Cross-filling via the anterior communicating artery of the left anterior cerebral A2 segment, and the left M1 segment was evident. An 8 French Angio-Seal closure device was deployed at the right groin puncture site for hemostasis. Distal pulses remained present unchanged in both feet at the end of the procedure. Flat panel CT of the brain demonstrated no evidence  of intracranial hemorrhage. Patient was extubated in was beginning to respond to simple commands appropriately slightly weaker on the right side. Medications utilized during the procedure. 81 mg of aspirin and Plavix 15 mg via orogastric tube prior to angioplasty. The patient was also given half bolus dose of cangrelor followed by a 4 hour half dose infusion to be followed by CT of the brain. Patient was then transferred to the neuro ICU for post revascularization care. 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 Underlying a significant atherosclerotic related stenosis treated with a 3 mm x 12 mm Onyx Frontier balloon mounted stent with significantly improved caliber and flow. Status post stent assisted angioplasty of proximal left ICA stenosis with proximal flow arrest as described without event. PLAN: Follow-up in the clinic 4 weeks post discharge. Electronically Signed   By: Julieanne Cotton M.D.   On: 02/02/2023 07:58   IR CT Head Ltd  Result Date: 02/02/2023 INDICATION: History of repeated falls. Patient with new onset expressive aphasia and possible right sided weakness. CTA demonstrates occluded left  internal carotid artery at the terminus, and approximately 65% stenosis of the left ICA. Also noted mid basilar artery occlusion. EXAM: 1. EMERGENT LARGE VESSEL OCCLUSION THROMBOLYSIS (anterior CIRCULATION) COMPARISON:  Recent CT angiogram of the head and neck, and MRI of the brain. MEDICATIONS: Ancef 2 g IV was administered within 1 hour of the procedure. ANESTHESIA/SEDATION: General anesthesia. CONTRAST:  Omnipaque 300 approximately 110 mL. FLUOROSCOPY TIME:  Fluoroscopy Time: 31 minutes 42 seconds (1110 mGy). COMPLICATIONS: None immediate. TECHNIQUE: Following a full explanation of the procedure along with the potential associated complications, an informed witnessed consent was obtained. The risks of intracranial hemorrhage of 10%, worsening neurological deficit, ventilator dependency, death and inability to revascularize were all reviewed in detail with the patient. The patient was then put under general anesthesia by the Department of Anesthesiology at [facility]. The right groin was prepped and draped in the usual sterile fashion. Thereafter using modified Seldinger technique, transfemoral access into the right common femoral artery was obtained without difficulty. Over an 0.035 inch guidewire an 8 French 25 cm Pinnacle sheath was inserted. Through this, and also over an 0.035 inch guidewire a combination of a 125 cm 6 [place] Berenstein support catheter inside of an 087 95 cm balloon guide catheter was advanced to the aortic arch region and advanced to the distal left common carotid artery. The guidewire, and the support catheter were removed. Good aspiration was obtained from the hub of the balloon guide catheter. A control arteriogram was then performed centered extra cranially and intracranially. FINDINGS: The left common carotid arteriogram demonstrates the left external carotid artery and its major branches to be widely patent. The left internal carotid artery just distal to the bulb has a focal  severe stenosis. 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 PROCEDURE: Through the balloon guide catheter in the distal left ICA, and over an 014 inch 300 cm exchange micro guidewire positioned in the horizontal petrous segment, control angioplasty of the proximal left internal carotid stenosis was performed with a 4 mm x 30 cm Viatrac 14 angioplasty balloon catheter which had been  prepped with 50% contrast and 50% heparinized saline infusion. Using the rapid exchange technique, the balloon was positioned with its markers adequate distant from the site of severe stenosis. A control inflation was then performed using a micro inflation syringe device via micro tubing to approximately 4 mm where it was maintained for approximately a minute and a half. Thereafter, following deflation of the balloon and removal a control arteriogram performed through the balloon guide catheter demonstrated now improved caliber of the angioplastied segment. At this time a 071 Zoom aspiration catheter was advanced in combination with an 021 160 cm Trevo ProVue microcatheter over the exchange micro guidewire to the proximal petrous segment. The 300 cm 014 exchange micro guidewire was then exchanged for an 018 inch micro guidewire with a moderate J configuration distally. The micro guidewire with the microcatheter was then gently advanced without difficulty through the occluded supraclinoid left ICA to the middle cerebral artery inferior division M2 segment followed by the microcatheter. The guidewire was removed. Good aspiration obtained from the hub of the microcatheter. A gentle control arteriogram performed through this demonstrated safe positioning of the tip of the microcatheter. A 4 mm x 40 mm Solitaire X retrieval device was then deployed with the proximal portion in the supraclinoid  left ICA. At this time the Zoom aspiration catheter was advanced without difficulty to the mid M1 segment. Control aspiration was then performed for approximately a minute and a half at the hub of the Zoom aspiration catheter, and with a 20 mL syringe at the hub of the balloon guide catheter for approximately a minute and a half. The combination was then retrieved and removed. Following reversal of flow arrest in the left internal carotid artery a control arteriogram performed demonstrated complete revascularization of the supraclinoid left ICA with opacification of the left middle cerebral artery achieving a TICI 3 revascularization. Also noted was opacification of the proximal left A1 segment with focal areas of severe stenosis due to arteriosclerosis. Bilateral PCOMs demonstrated opacification into the left posterior cerebral artery P1 segment. Underlying intracranial arteriosclerosis with resulting severe stenosis was noted just proximal to the origins of the posterior communicating arteries. Through the balloon guide catheter in the distal left ICA, an aspiration 5 French 115 cm guide catheter was then advanced to the proximal cavernous segment over an 035 inch guidewire. Through this, 160 cm Trevo ProVue microcatheter was advanced over an 018 inch micro guidewire. The microcatheter and the micro guidewire were advanced to the distal M2 M3 regions of the inferior division followed by the microcatheter. The guidewire was removed. Good aspiration obtained from the hub of the microcatheter. This in turn was exchanged for an 014 inch 300 cm exchange micro guidewire with a moderate J configuration at its distal end. A control arteriogram performed demonstrated safe positioning of the tip of the micro guidewire. Measurements were then performed of the left ICA supraclinoid segment just proximal to the bifurcation and just distal to the origin of the ophthalmic artery. A 3 mm x 12 mm Onyx Frontier balloon mounted  stent which had been prepped antegradely with 50% contrast and 50% heparinized saline infusion, and retrogradely with heparinized saline infusion, was advanced in a coaxial manner and with constant heparinized saline infusion and positioned without difficulty with the stent markers adequate distant from the area of severe stenosis. A control inflation was then performed using a micro inflation syringe device via micro tubing to just over 3 mm x 2 where it was maintained for approximately a  minute and a half. Following balloon deflation and retrieval and removal, a control arteriogram performed through the 5 [place] intermediate catheter demonstrated significantly improved caliber of the supraclinoid left ICA with a TICI 3 revascularization of the left MCA being persistent. Again demonstrated was the stenosed left anterior cerebral A1 segment. Over the exchange micro guidewire now in the proximal cavernous segment, a control arteriogram performed through the balloon guide catheter following removal of the intermediate catheter. Measurements were then performed of the left internal carotid artery proximally at the site of the significant stenosis and the distal left common carotid artery. A 6/8 mm x 40 mm Xact stent delivery apparatus was then advanced in a coaxial manner and with constant heparinized saline infusion and positioned without difficulty with the adequate coverage distal to the angioplastied segment and into the distal left common carotid artery. The stent was then deployed in the usual manner without any difficulty. The delivery apparatus was removed. A control arteriogram performed through the balloon guide catheter demonstrated significantly improved caliber through the stented segment of the proximal left ICA. The waist at the site of the angioplasty was then treated with a 5 mm x 30 mm Viatrac angioplasty microcatheter which was advanced after its preparation as described above. Prior to the  angioplasty, proximal flow arrest was initiated by inflating the balloon in the distal left common carotid artery, and with proximal aspiration during the angioplasty. Following the angioplasty, the balloon was retrieved and removed while aspiration was maintained as flow arrest was reversed. Control arteriograms were then performed through the balloon guide catheter over the next 10-20 minutes continued to demonstrate excellent flow through the centered in the proximal left ICA. Intracranially no change was seen in the TICI 3 revascularization of the left middle cerebral artery distribution. A 5 French diagnostic catheter was then advanced into the right common carotid artery over an 035 inch guidewire. The right common carotid artery injection demonstrated patency of the right internal carotid artery at the neck, and intracranially. The petrous, the cavernous and the supraclinoid right ICA demonstrate wide patency. The right middle cerebral artery and the right anterior cerebral artery demonstrate wide patency into the capillary and venous phases with a 50% stenosis of the right middle cerebral artery M1 segment. Cross-filling via the anterior communicating artery of the left anterior cerebral A2 segment, and the left M1 segment was evident. An 8 French Angio-Seal closure device was deployed at the right groin puncture site for hemostasis. Distal pulses remained present unchanged in both feet at the end of the procedure. Flat panel CT of the brain demonstrated no evidence of intracranial hemorrhage. Patient was extubated in was beginning to respond to simple commands appropriately slightly weaker on the right side. Medications utilized during the procedure. 81 mg of aspirin and Plavix 15 mg via orogastric tube prior to angioplasty. The patient was also given half bolus dose of cangrelor followed by a 4 hour half dose infusion to be followed by CT of the brain. Patient was then transferred to the neuro ICU for post  revascularization care. 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 Underlying a significant atherosclerotic related stenosis treated with a 3 mm x 12 mm Onyx Frontier balloon mounted stent with significantly improved caliber and flow. Status post stent assisted angioplasty of proximal left ICA stenosis  with proximal flow arrest as described without event. PLAN: Follow-up in the clinic 4 weeks post discharge. Electronically Signed   By: Julieanne Cotton M.D.   On: 02/02/2023 07:58   IR INTRAVSC STENT CERV CAROTID W/O EMB-PROT MOD SED  Result Date: 02/02/2023 INDICATION: History of repeated falls. Patient with new onset expressive aphasia and possible right sided weakness. 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 Also noted mid basilar artery occlusion. EXAM: 1. EMERGENT LARGE VESSEL OCCLUSION THROMBOLYSIS (anterior CIRCULATION) COMPARISON:  Recent CT angiogram of the head and neck, and MRI of the brain. MEDICATIONS: Ancef 2 g IV was administered within 1 hour of the procedure. ANESTHESIA/SEDATION: General anesthesia. CONTRAST:  Omnipaque 300 approximately 110 mL. FLUOROSCOPY TIME:  Fluoroscopy Time: 31 minutes 42 seconds (1110 mGy). COMPLICATIONS: None immediate. TECHNIQUE: Following a full explanation of the procedure along with the potential associated complications, an informed witnessed consent was obtained. The risks of intracranial hemorrhage of 10%, worsening neurological deficit, ventilator dependency, death and inability to revascularize were all reviewed in detail with the patient. The patient was then put under general anesthesia by the Department of Anesthesiology at [facility]. The right groin was prepped  and draped in the usual sterile fashion. Thereafter using modified Seldinger technique, transfemoral access into the right common femoral artery was obtained without difficulty. Over an 0.035 inch guidewire an 8 French 25 cm Pinnacle sheath was inserted. Through this, and also over an 0.035 inch guidewire a combination of a 125 cm 6 [place] Berenstein support catheter inside of an 087 95 cm balloon guide catheter was advanced to the aortic arch region and advanced to the distal left common carotid artery. The guidewire, and the support catheter were removed. Good aspiration was obtained from the hub of the balloon guide catheter. A control arteriogram was then performed centered extra cranially and intracranially. FINDINGS: The left common carotid arteriogram demonstrates the left external carotid artery and its major branches to be widely patent. The left internal carotid artery just distal to the bulb has a focal severe stenosis. 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 PROCEDURE: Through the balloon guide catheter in the distal left ICA, and over an 014 inch 300 cm exchange micro guidewire positioned in the horizontal petrous segment, control angioplasty of the proximal left internal carotid stenosis was performed with a 4 mm x 30 cm Viatrac 14 angioplasty balloon catheter which had been prepped with 50% contrast and 50% heparinized saline infusion. Using the rapid exchange technique, the balloon was positioned with its markers adequate distant from the site of severe stenosis. A control inflation was then performed using a micro inflation syringe device via micro tubing to approximately 4 mm where it was maintained for approximately a minute and a half. Thereafter, following deflation of the balloon and removal a control arteriogram performed through the balloon  guide catheter demonstrated now improved caliber of the angioplastied segment. At this time a 071 Zoom aspiration catheter was advanced in combination with an 021 160 cm Trevo ProVue microcatheter over the exchange micro guidewire to the proximal petrous segment. The 300 cm 014 exchange micro guidewire was then exchanged for an 018 inch micro guidewire with a moderate J configuration distally. The micro guidewire with the microcatheter was then gently advanced without  difficulty through the occluded supraclinoid left ICA to the middle cerebral artery inferior division M2 segment followed by the microcatheter. The guidewire was removed. Good aspiration obtained from the hub of the microcatheter. A gentle control arteriogram performed through this demonstrated safe positioning of the tip of the microcatheter. A 4 mm x 40 mm Solitaire X retrieval device was then deployed with the proximal portion in the supraclinoid left ICA. At this time the Zoom aspiration catheter was advanced without difficulty to the mid M1 segment. Control aspiration was then performed for approximately a minute and a half at the hub of the Zoom aspiration catheter, and with a 20 mL syringe at the hub of the balloon guide catheter for approximately a minute and a half. The combination was then retrieved and removed. Following reversal of flow arrest in the left internal carotid artery a control arteriogram performed demonstrated complete revascularization of the supraclinoid left ICA with opacification of the left middle cerebral artery achieving a TICI 3 revascularization. Also noted was opacification of the proximal left A1 segment with focal areas of severe stenosis due to arteriosclerosis. Bilateral PCOMs demonstrated opacification into the left posterior cerebral artery P1 segment. Underlying intracranial arteriosclerosis with resulting severe stenosis was noted just proximal to the origins of the posterior communicating arteries. Through  the balloon guide catheter in the distal left ICA, an aspiration 5 French 115 cm guide catheter was then advanced to the proximal cavernous segment over an 035 inch guidewire. Through this, 160 cm Trevo ProVue microcatheter was advanced over an 018 inch micro guidewire. The microcatheter and the micro guidewire were advanced to the distal M2 M3 regions of the inferior division followed by the microcatheter. The guidewire was removed. Good aspiration obtained from the hub of the microcatheter. This in turn was exchanged for an 014 inch 300 cm exchange micro guidewire with a moderate J configuration at its distal end. A control arteriogram performed demonstrated safe positioning of the tip of the micro guidewire. Measurements were then performed of the left ICA supraclinoid segment just proximal to the bifurcation and just distal to the origin of the ophthalmic artery. A 3 mm x 12 mm Onyx Frontier balloon mounted stent which had been prepped antegradely with 50% contrast and 50% heparinized saline infusion, and retrogradely with heparinized saline infusion, was advanced in a coaxial manner and with constant heparinized saline infusion and positioned without difficulty with the stent markers adequate distant from the area of severe stenosis. A control inflation was then performed using a micro inflation syringe device via micro tubing to just over 3 mm x 2 where it was maintained for approximately a minute and a half. Following balloon deflation and retrieval and removal, a control arteriogram performed through the 5 [place] intermediate catheter demonstrated significantly improved caliber of the supraclinoid left ICA with a TICI 3 revascularization of the left MCA being persistent. Again demonstrated was the stenosed left anterior cerebral A1 segment. Over the exchange micro guidewire now in the proximal cavernous segment, a control arteriogram performed through the balloon guide catheter following removal of the  intermediate catheter. Measurements were then performed of the left internal carotid artery proximally at the site of the significant stenosis and the distal left common carotid artery. A 6/8 mm x 40 mm Xact stent delivery apparatus was then advanced in a coaxial manner and with constant heparinized saline infusion and positioned without difficulty with the adequate coverage distal to the angioplastied segment and into the distal left common carotid artery. The  stent was then deployed in the usual manner without any difficulty. The delivery apparatus was removed. A control arteriogram performed through the balloon guide catheter demonstrated significantly improved caliber through the stented segment of the proximal left ICA. The waist at the site of the angioplasty was then treated with a 5 mm x 30 mm Viatrac angioplasty microcatheter which was advanced after its preparation as described above. Prior to the angioplasty, proximal flow arrest was initiated by inflating the balloon in the distal left common carotid artery, and with proximal aspiration during the angioplasty. Following the angioplasty, the balloon was retrieved and removed while aspiration was maintained as flow arrest was reversed. Control arteriograms were then performed through the balloon guide catheter over the next 10-20 minutes continued to demonstrate excellent flow through the centered in the proximal left ICA. Intracranially no change was seen in the TICI 3 revascularization of the left middle cerebral artery distribution. A 5 French diagnostic catheter was then advanced into the right common carotid artery over an 035 inch guidewire. The right common carotid artery injection demonstrated patency of the right internal carotid artery at the neck, and intracranially. The petrous, the cavernous and the supraclinoid right ICA demonstrate wide patency. The right middle cerebral artery and the right anterior cerebral artery demonstrate wide patency  into the capillary and venous phases with a 50% stenosis of the right middle cerebral artery M1 segment. Cross-filling via the anterior communicating artery of the left anterior cerebral A2 segment, and the left M1 segment was evident. An 8 French Angio-Seal closure device was deployed at the right groin puncture site for hemostasis. Distal pulses remained present unchanged in both feet at the end of the procedure. Flat panel CT of the brain demonstrated no evidence of intracranial hemorrhage. Patient was extubated in was beginning to respond to simple commands appropriately slightly weaker on the right side. Medications utilized during the procedure. 81 mg of aspirin and Plavix 15 mg via orogastric tube prior to angioplasty. The patient was also given half bolus dose of cangrelor followed by a 4 hour half dose infusion to be followed by CT of the brain. Patient was then transferred to the neuro ICU for post revascularization care. 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 Underlying a significant atherosclerotic related stenosis treated with a 3 mm x 12 mm Onyx Frontier balloon mounted stent with significantly improved caliber and flow. Status post stent assisted angioplasty of proximal left ICA stenosis with proximal flow arrest as described without event. PLAN: Follow-up in the clinic 4 weeks post discharge. Electronically Signed   By: Julieanne Cotton M.D.   On: 02/02/2023 07:58   IR Intra Cran Stent  Result Date: 02/02/2023 INDICATION: History of repeated falls. Patient with new onset expressive aphasia and possible right sided weakness. 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 Also noted mid basilar artery occlusion.  EXAM: 1. EMERGENT LARGE VESSEL OCCLUSION THROMBOLYSIS (anterior CIRCULATION) COMPARISON:  Recent CT angiogram of the head and neck, and MRI of the brain. MEDICATIONS: Ancef 2 g IV was administered within 1 hour of the procedure. ANESTHESIA/SEDATION: General anesthesia. CONTRAST:  Omnipaque 300 approximately 110 mL. FLUOROSCOPY TIME:  Fluoroscopy Time: 31 minutes 42 seconds (1110 mGy). COMPLICATIONS: None immediate. TECHNIQUE: Following a full explanation  of the procedure along with the potential associated complications, an informed witnessed consent was obtained. The risks of intracranial hemorrhage of 10%, worsening neurological deficit, ventilator dependency, death and inability to revascularize were all reviewed in detail with the patient. The patient was then put under general anesthesia by the Department of Anesthesiology at [facility]. The right groin was prepped and draped in the usual sterile fashion. Thereafter using modified Seldinger technique, transfemoral access into the right common femoral artery was obtained without difficulty. Over an 0.035 inch guidewire an 8 French 25 cm Pinnacle sheath was inserted. Through this, and also over an 0.035 inch guidewire a combination of a 125 cm 6 [place] Berenstein support catheter inside of an 087 95 cm balloon guide catheter was advanced to the aortic arch region and advanced to the distal left common carotid artery. The guidewire, and the support catheter were removed. Good aspiration was obtained from the hub of the balloon guide catheter. A control arteriogram was then performed centered extra cranially and intracranially. FINDINGS: The left common carotid arteriogram demonstrates the left external carotid artery and its major branches to be widely patent. The left internal carotid artery just distal to the bulb has a focal severe stenosis. Distal to this the vessel is seen to opacify to the cranial skull base leading up to a complete occlusion of the  supraclinoid left ICA just distal to the origin of the left ophthalmic artery. PROCEDURE: Through the balloon guide catheter in the distal left ICA, and over an 014 inch 300 cm exchange micro guidewire positioned in the horizontal petrous segment, control angioplasty of the proximal left internal carotid stenosis was performed with a 4 mm x 30 cm Viatrac 14 angioplasty balloon catheter which had been prepped with 50% contrast and 50% heparinized saline infusion. Using the rapid exchange technique, the balloon was positioned with its markers adequate distant from the site of severe stenosis. A control inflation was then performed using a micro inflation syringe device via micro tubing to approximately 4 mm where it was maintained for approximately a minute and a half. Thereafter, following deflation of the balloon and removal a control arteriogram performed through the balloon guide catheter demonstrated now improved caliber of the angioplastied segment. At this time a 071 Zoom aspiration catheter was advanced in combination with an 021 160 cm Trevo ProVue microcatheter over the exchange micro guidewire to the proximal petrous segment. The 300 cm 014 exchange micro guidewire was then exchanged for an 018 inch micro guidewire with a moderate J configuration distally. The micro guidewire with the microcatheter was then gently advanced without difficulty through the occluded supraclinoid left ICA to the middle cerebral artery inferior division M2 segment followed by the microcatheter. The guidewire was removed. Good aspiration obtained from the hub of the microcatheter. A gentle control arteriogram performed through this demonstrated safe positioning of the tip of the microcatheter. A 4 mm x 40 mm Solitaire X retrieval device was then deployed with the proximal portion in the supraclinoid left ICA. At this time the Zoom aspiration catheter was advanced without difficulty to the mid M1 segment. Control aspiration was  then performed for approximately a minute and a half at the hub of the Zoom aspiration catheter, and with a 20 mL syringe at the hub of the balloon guide catheter for approximately a minute and a half. The combination was then retrieved and removed. Following reversal of flow arrest in the left internal carotid artery a control arteriogram performed demonstrated complete  revascularization of the supraclinoid left ICA with opacification of the left middle cerebral artery achieving a TICI 3 revascularization. Also noted was opacification of the proximal left A1 segment with focal areas of severe stenosis due to arteriosclerosis. Bilateral PCOMs demonstrated opacification into the left posterior cerebral artery P1 segment. Underlying intracranial arteriosclerosis with resulting severe stenosis was noted just proximal to the origins of the posterior communicating arteries. Through the balloon guide catheter in the distal left ICA, an aspiration 5 French 115 cm guide catheter was then advanced to the proximal cavernous segment over an 035 inch guidewire. Through this, 160 cm Trevo ProVue microcatheter was advanced over an 018 inch micro guidewire. The microcatheter and the micro guidewire were advanced to the distal M2 M3 regions of the inferior division followed by the microcatheter. The guidewire was removed. Good aspiration obtained from the hub of the microcatheter. This in turn was exchanged for an 014 inch 300 cm exchange micro guidewire with a moderate J configuration at its distal end. A control arteriogram performed demonstrated safe positioning of the tip of the micro guidewire. Measurements were then performed of the left ICA supraclinoid segment just proximal to the bifurcation and just distal to the origin of the ophthalmic artery. A 3 mm x 12 mm Onyx Frontier balloon mounted stent which had been prepped antegradely with 50% contrast and 50% heparinized saline infusion, and retrogradely with heparinized  saline infusion, was advanced in a coaxial manner and with constant heparinized saline infusion and positioned without difficulty with the stent markers adequate distant from the area of severe stenosis. A control inflation was then performed using a micro inflation syringe device via micro tubing to just over 3 mm x 2 where it was maintained for approximately a minute and a half. Following balloon deflation and retrieval and removal, a control arteriogram performed through the 5 [place] intermediate catheter demonstrated significantly improved caliber of the supraclinoid left ICA with a TICI 3 revascularization of the left MCA being persistent. Again demonstrated was the stenosed left anterior cerebral A1 segment. Over the exchange micro guidewire now in the proximal cavernous segment, a control arteriogram performed through the balloon guide catheter following removal of the intermediate catheter. Measurements were then performed of the left internal carotid artery proximally at the site of the significant stenosis and the distal left common carotid artery. A 6/8 mm x 40 mm Xact stent delivery apparatus was then advanced in a coaxial manner and with constant heparinized saline infusion and positioned without difficulty with the adequate coverage distal to the angioplastied segment and into the distal left common carotid artery. The stent was then deployed in the usual manner without any difficulty. The delivery apparatus was removed. A control arteriogram performed through the balloon guide catheter demonstrated significantly improved caliber through the stented segment of the proximal left ICA. The waist at the site of the angioplasty was then treated with a 5 mm x 30 mm Viatrac angioplasty microcatheter which was advanced after its preparation as described above. Prior to the angioplasty, proximal flow arrest was initiated by inflating the balloon in the distal left common carotid artery, and with proximal  aspiration during the angioplasty. Following the angioplasty, the balloon was retrieved and removed while aspiration was maintained as flow arrest was reversed. Control arteriograms were then performed through the balloon guide catheter over the next 10-20 minutes continued to demonstrate excellent flow through the centered in the proximal left ICA. Intracranially no change was seen in the TICI 3 revascularization of  the left middle cerebral artery distribution. A 5 French diagnostic catheter was then advanced into the right common carotid artery over an 035 inch guidewire. The right common carotid artery injection demonstrated patency of the right internal carotid artery at the neck, and intracranially. The petrous, the cavernous and the supraclinoid right ICA demonstrate wide patency. The right middle cerebral artery and the right anterior cerebral artery demonstrate wide patency into the capillary and venous phases with a 50% stenosis of the right middle cerebral artery M1 segment. Cross-filling via the anterior communicating artery of the left anterior cerebral A2 segment, and the left M1 segment was evident. An 8 French Angio-Seal closure device was deployed at the right groin puncture site for hemostasis. Distal pulses remained present unchanged in both feet at the end of the procedure. Flat panel CT of the brain demonstrated no evidence of intracranial hemorrhage. Patient was extubated in was beginning to respond to simple commands appropriately slightly weaker on the right side. Medications utilized during the procedure. 81 mg of aspirin and Plavix 15 mg via orogastric tube prior to angioplasty. The patient was also given half bolus dose of cangrelor followed by a 4 hour half dose infusion to be followed by CT of the brain. Patient was then transferred to the neuro ICU for post revascularization care. IMPRESSION: Status post complete revascularization of occluded left internal carotid artery terminus with 1  pass with a 4 mm x 40 mm Solitaire X retrieval device, and contact aspiration achieving a TICI 3 revascularization of the left MCA distribution. Underlying a significant atherosclerotic related stenosis treated with a 3 mm x 12 mm Onyx Frontier balloon mounted stent with significantly improved caliber and flow. Status post stent assisted angioplasty of proximal left ICA stenosis with proximal flow arrest as described without event. PLAN: Follow-up in the clinic 4 weeks post discharge. Electronically Signed   By: Julieanne Cotton M.D.   On: 02/02/2023 07:58   ECHOCARDIOGRAM COMPLETE  Result Date: 02/01/2023    ECHOCARDIOGRAM REPORT   Patient Name:   Grant Berry Date of Exam: 02/01/2023 Medical Rec #:  [id number]          Height:       67.0 in Accession #:    [id number]         Weight:       119.3 lb Date of Birth:  06/24/1945          BSA:          1.623 m Patient Age:    77 years           BP:           172/87 mmHg Patient Gender: M                  HR:           92 bpm. Exam Location:  Inpatient Procedure: 2D Echo, Cardiac Doppler and Color Doppler Indications:    Stroke  History:        Patient has prior history of Echocardiogram examinations, most                 recent 09/02/2019. Stroke; Risk Factors:Hypertension and                 Dyslipidemia.  Sonographer:    Milda Smart Referring Phys: [id number] [facility]  Sonographer Comments: Suboptimal parasternal window. Image acquisition challenging due to patient body habitus and Image acquisition challenging due to respiratory motion. IMPRESSIONS  1. Left ventricular ejection fraction, by estimation, is 60 to 65%. The left ventricle has normal function. The left ventricle has no regional wall motion abnormalities. Left ventricular diastolic parameters are consistent with Grade I diastolic dysfunction (impaired relaxation).  2. Right ventricular systolic function is normal. The right ventricular size is normal.  3. The mitral valve is abnormal. No  evidence of mitral valve regurgitation. No evidence of mitral stenosis.  4. The aortic valve was not well visualized. There is mild calcification of the aortic valve. There is mild thickening of the aortic valve. Aortic valve regurgitation is not visualized. Aortic valve sclerosis is present, with no evidence of aortic valve  stenosis.  5. The inferior vena cava is normal in size with greater than 50% respiratory variability, suggesting right atrial pressure of 3 mmHg. FINDINGS  Left Ventricle: Left ventricular ejection fraction, by estimation, is 60 to 65%. The left ventricle has normal function. The left ventricle has no regional wall motion abnormalities. The left ventricular internal cavity size was normal in size. There is  no left ventricular hypertrophy. Left ventricular diastolic parameters are consistent with Grade I diastolic dysfunction (impaired relaxation). Right Ventricle: The right ventricular size is normal. No increase in right ventricular wall thickness. Right ventricular systolic function is normal. Left Atrium: Left atrial size was normal in size. Right Atrium: Right atrial size was normal in size. Pericardium: There is no evidence of pericardial effusion. Mitral Valve: The mitral valve is abnormal. There is mild thickening of the mitral valve leaflet(s). There is mild calcification of the mitral valve leaflet(s). Mild mitral annular calcification. No evidence of mitral valve regurgitation. No evidence of mitral valve stenosis. MV peak gradient, 5.8 mmHg. The mean mitral valve gradient is 3.0 mmHg. Tricuspid Valve: The tricuspid valve is normal in structure. Tricuspid valve regurgitation is not demonstrated. No evidence of tricuspid stenosis. Aortic Valve: The aortic valve was not well visualized. There is mild calcification of the aortic valve. There is mild thickening of the aortic valve. Aortic valve regurgitation is not visualized. Aortic valve sclerosis is present, with no evidence of aortic  valve stenosis. Pulmonic Valve: The pulmonic valve was normal in structure. Pulmonic valve regurgitation is not visualized. No evidence of pulmonic stenosis. Aorta: The aortic root is normal in size and structure. Venous: The inferior vena cava is normal in size with greater than 50% respiratory variability, suggesting right atrial pressure of 3 mmHg. IAS/Shunts: No atrial level shunt detected by color flow Doppler.  LEFT VENTRICLE PLAX 2D LVIDd:         4.40 cm   Diastology LVIDs:         3.20 cm   LV e' medial:    6.20 cm/s LV PW:         0.80 cm   LV E/e' medial:  13.4 LV IVS:        0.60 cm   LV e' lateral:   7.94 cm/s LVOT diam:     2.50 cm   LV E/e' lateral: 10.5 LVOT Area:     4.91 cm  RIGHT VENTRICLE             IVC RV S prime:     17.80 cm/s  IVC diam: 1.40 cm TAPSE (M-mode): 2.5 cm LEFT ATRIUM             Index        RIGHT ATRIUM           Index LA diam:  3.30 cm 2.03 cm/m   RA Area:     11.70 cm LA Vol (A2C):   19.1 ml 11.77 ml/m  RA Volume:   26.80 ml  16.51 ml/m LA Vol (A4C):   27.2 ml 16.76 ml/m LA Biplane Vol: 23.8 ml 14.66 ml/m   AORTA Ao Root diam: 3.60 cm Ao Asc diam:  3.40 cm MITRAL VALVE MV Area (PHT): 1.73 cm     SHUNTS MV Peak grad:  5.8 mmHg     Systemic Diam: 2.50 cm MV Mean grad:  3.0 mmHg MV Vmax:       1.20 m/s MV Vmean:      83.8 cm/s MV Decel Time: 438 msec MV E velocity: 83.30 cm/s MV A velocity: 108.00 cm/s MV E/A ratio:  0.77 Charlton Haws MD Electronically signed by Charlton Haws MD Signature Date/Time: 02/01/2023/3:01:07 PM    Final    MR BRAIN WO CONTRAST  Result Date: 02/01/2023 CLINICAL DATA:  Stroke and embolectomy. EXAM: MRI HEAD WITHOUT CONTRAST TECHNIQUE: Multiplanar, multiecho pulse sequences of the brain and surrounding structures were obtained without intravenous contrast. COMPARISON:  Head CT and MRI from yesterday FINDINGS: Brain: Moderate acute infarct in the left occipital cortex, increased from yesterday. Punctate acute infarct in the superior left  frontal white matter since prior. There are areas of low-grade restricted diffusion in the left periatrial white matter and right occipital pole attributed to subacute ischemia. Subarachnoid hemorrhage along the anterior left frontal convexity, distinct from any areas of acute infarction and known from prior head CT. Pre-existing bilateral cerebellar and brainstem small infarcts. Chronic lacunar infarcts in theright caudate. Ischemic gliosis is mild. 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 Generalized brain atrophy. Vascular: No flow seen in the left vertebral artery. There is a basilar flow void. Skull and upper cervical spine: No focal marrow lesion Sinuses/Orbits: No acute finding IMPRESSION: 1. Moderate acute infarct in the left occipital lobe with mild progression from yesterday. Interval punctate acute infarct in the left frontal white matter. 2. Areas of chronic and subacute ischemia preferentially affecting the posterior circulation. 3. Known subarachnoid hemorrhage at the anterior left frontal convexity, stable from preceding head CT. Electronically Signed   By: Tiburcio Pea M.D.   On: 02/01/2023 04:58   CT HEAD WO CONTRAST ( )  Result Date: 01/31/2023 CLINICAL DATA:  Stroke, follow-up; post canrelor infusion EXAM: CT HEAD WITHOUT CONTRAST TECHNIQUE: Contiguous axial images were obtained from the base of the skull through the vertex without intravenous contrast. RADIATION DOSE REDUCTION: This exam was performed according to the departmental dose-optimization program which includes automated exposure control, adjustment of the mA and/or kV according to patient size and/or use of iterative reconstruction technique. COMPARISON:  CT head 10/01/2022 and intra procedural CT 10/01/2022 FINDINGS: Brain: Hyperdense material in the left frontal sulci (series 5, image 47 and 60 and series 3, images 15-18 and 25 area and series 3, image 25), which may represent  subarachnoid hemorrhage versus contrast staining. This was not apparent on the immediate postprocedural CT head. Redemonstrated cytotoxic edema in the in the left occipital lobe (series 3, image 14), which is slightly more prominent than on the prior exam. More heterogeneous hypodensity in the right occipital lobe, which is favored to be chronic remote infarcts in the pons and bilateral cerebellar hemispheres. No evidence of acute infarction, hemorrhage, mass, mass effect, or midline shift. No hydrocephalus or extra-axial fluid collection. Vascular: No hyperdense vessel. Atherosclerotic calcifications in the intracranial carotid and  vertebral arteries. Skull: Negative for fracture or focal lesion. Sinuses/Orbits: Mucosal thickening in the ethmoid air cells. No acute finding in the orbits. Status post bilateral lens replacements. Other: The mastoid air cells are well aerated. IMPRESSION: 1. Hyperdense material in the left frontal sulci, which may represent subarachnoid hemorrhage versus contrast staining. This was not apparent on the immediate postprocedural CT head. Recommend short-term follow-up head CT. 2. Redemonstrated cytotoxic edema in the left occipital lobe, which is slightly more prominent than on the prior exam. These results will be called to the ordering clinician or representative by the Radiologist Assistant, and communication documented in the PACS or [employer]. Electronically Signed   By: Wiliam Ke M.D.   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  Neurology feels that this is due to the large vessel source. Does the need for close, 24 hr/day medical supervision in concert with the patient's rehab needs make it unreasonable for this patient to be served in a less intensive setting? Yes Co-Morbidities requiring supervision/potential complications:   -Hypertension -Acute kidney injury -Poststroke sequelae Due to bladder management, bowel management, safety, skin/wound care, disease management, medication administration, pain management, and patient education, does the patient require 24 hr/day rehab nursing? Yes Does the patient require coordinated care of a physician, rehab nurse, therapy disciplines of PT, OT, SLP to address physical and functional deficits in the context of the above medical diagnosis(es)? Yes Addressing deficits in the following areas: balance, endurance, locomotion, strength, transferring, bowel/bladder control, bathing, dressing, feeding, grooming, toileting, cognition, speech, swallowing, and psychosocial support Can the patient actively participate in an intensive therapy program of at least 3 hrs of therapy per day at least 5 days per week? Yes and Potentially The potential for patient to make measurable gains while on inpatient rehab is good Anticipated functional outcomes upon discharge from inpatient rehab are supervision and min assist  with PT, supervision and min assist with OT, supervision with SLP. Estimated rehab length of stay to reach the above functional goals is: 18-24 days Anticipated discharge destination:  potentially home Overall Rehab/Functional Prognosis: good  POST ACUTE RECOMMENDATIONS: This patient's condition is appropriate for continued rehabilitative care in the following setting:  diagnostically, he would be best served on inpatient rehab Patient has agreed to participate in recommended program. N/A Note that insurance prior authorization may be required for reimbursement for recommended care.  Comment: Pt's son is at home but works 2nd shift.  Mr. Lowers would need someone with him when he returns home from a potential inpatient rehab admit. Perhaps there is other family or a friend available. Rehab Admissions Coordinator to follow up.       I have personally performed a face to face  diagnostic evaluation of this patient. Additionally, I have examined the patient's medical record including any pertinent labs and radiographic images. If the physician assistant has documented in this note, I have reviewed and edited or otherwise concur with the physician assistant's documentation.  Thanks,  Ranelle Oyster, MD 02/02/2023

## 2023-02-03 ENCOUNTER — Other Ambulatory Visit: Payer: Self-pay | Admitting: Physician Assistant

## 2023-02-03 DIAGNOSIS — I6522 Occlusion and stenosis of left carotid artery: Secondary | ICD-10-CM

## 2023-02-03 LAB — CBC
HCT: 30.9 % — ABNORMAL LOW (ref 39.0–52.0)
Hemoglobin: 10.5 g/dL — ABNORMAL LOW (ref 13.0–17.0)
MCH: 30 pg (ref 26.0–34.0)
MCHC: 34 g/dL (ref 30.0–36.0)
MCV: 88.3 fL (ref 80.0–100.0)
Platelets: 168 10*3/uL (ref 150–400)
RBC: 3.5 MIL/uL — ABNORMAL LOW (ref 4.22–5.81)
RDW: 14.3 % (ref 11.5–15.5)
WBC: 8.8 10*3/uL (ref 4.0–10.5)
nRBC: 0 % (ref 0.0–0.2)

## 2023-02-03 LAB — BASIC METABOLIC PANEL
Anion gap: 10 (ref 5–15)
BUN: 12 mg/dL (ref 8–23)
CO2: 24 mmol/L (ref 22–32)
Calcium: 9.2 mg/dL (ref 8.9–10.3)
Chloride: 102 mmol/L (ref 98–111)
Creatinine, Ser: 1.21 mg/dL (ref 0.61–1.24)
GFR, Estimated: >60 mL/min (ref >60)
Glucose, Bld: 104 mg/dL — ABNORMAL HIGH (ref 70–99)
Potassium: 3.3 mmol/L — ABNORMAL LOW (ref 3.5–5.1)
Sodium: 136 mmol/L (ref 135–145)

## 2023-02-03 MED ORDER — SODIUM CHLORIDE 0.9 % IV SOLN: INTRAVENOUS | Status: DC

## 2023-02-03 MED ORDER — OXYCODONE HCL 5 MG PO TABS
5.0000 mg | ORAL_TABLET | Freq: Four times a day (QID) | ORAL | Status: DC | PRN
  Administered 2023-02-03 – 2023-02-10 (×5): 5 mg via ORAL
  Filled 2023-02-03 (×5): qty 1

## 2023-02-03 NOTE — Plan of Care (Signed)
  Problem: Education: Goal: Knowledge of secondary prevention will improve (MUST DOCUMENT ALL) Outcome: Progressing   Problem: Ischemic Stroke/TIA Tissue Perfusion: Goal: Complications of ischemic stroke/TIA will be minimized Outcome: Progressing   Problem: Coping: Goal: Will verbalize positive feelings about self Outcome: Progressing

## 2023-02-03 NOTE — Progress Notes (Signed)
Inpatient Rehab Admissions Coordinator:   Stopped by to see pt at bedside, unable to arouse/participate in conversation.  Per PT, lethargic throughout the day, unable to participate.  Family only able to provide intermittent supervision/assist as they work.  I do not think that he will be able to reach of level of independence to be home alone during the day with a short CIR stay.  Recommend SNF at this time.   Estill Dooms, PT, DPT Admissions Coordinator [phone] 02/03/23  4:00 PM

## 2023-02-03 NOTE — Progress Notes (Signed)
STROKE TEAM PROGRESS NOTE   SUBJECTIVE (INTERVAL HISTORY) RN is at the bedside.  Patient still mild restless, but less agitated and less delirium. Still has severe dysarthria. Answered most orientation questions correctly. Moving all extremities.    OBJECTIVE Temp:  [98 F (36.7 C)-99.5 F (37.5 C)] 99.5 F (37.5 C) (12/11 1525) Pulse Rate:  [77-105] 105 (12/11 1525) Cardiac Rhythm: Sinus tachycardia (12/11 1911) Resp:  [17-19] 18 (12/11 1525) BP: (122-141)/(69-84) 122/74 (12/11 1525) SpO2:  [95 %-99 %] 95 % (12/11 1525)  Recent Labs  Lab 01/31/23 0035  GLUCAP 93   Recent Labs  Lab 01/31/23 0040 01/31/23 0042 02/01/23 0444 02/02/23 0455 02/03/23 0604  NA 139 139 137 136 136  K 3.5 3.6 3.7 3.4* 3.3*  CL 104 102 105 101 102  CO2 24  --  23 24 24   GLUCOSE 102* 102* 80 101* 104*  BUN 27* 27* 9 14 12   CREATININE 1.33* 1.40* 1.06 1.07 1.21  CALCIUM 9.7  --  8.3* 9.4 9.2   Recent Labs  Lab 01/31/23 0040  AST 27  ALT 15  ALKPHOS 55  BILITOT 0.5  PROT 7.2  ALBUMIN 4.0   Recent Labs  Lab 01/31/23 0040 01/31/23 0042 01/31/23 0454 02/01/23 0444 02/02/23 0455 02/03/23 0604  WBC 6.7  --  6.4 6.3 8.4 8.8  NEUTROABS 4.5  --   --  4.5  --   --   HGB 12.5* 12.9* 12.9* 9.3* 11.0* 10.5*  HCT 37.6* 38.0* 38.9* 29.1* 32.5* 30.9*  MCV 90.6  --  89.6 92.7 88.3 88.3  PLT 192  --  196 153 165 168        Component Value Date/Time   CHOL 235 (H) 01/31/2023 0454   CHOL 202 (H) 07/10/2022 1012   CHOL 219 (H) 06/17/2012 1620   TRIG 47 01/31/2023 0454   TRIG 215 (H) 06/17/2012 1620   HDL 59 01/31/2023 0454   HDL 48 07/10/2022 1012   HDL 34 (L) 06/17/2012 1620   CHOLHDL 4.0 01/31/2023 0454   VLDL 9 01/31/2023 0454   LDLCALC 167 (H) 01/31/2023 0454   LDLCALC 134 (H) 07/10/2022 1012   LDLCALC 142 (H) 06/17/2012 1620   Lab Results  Component Value Date   HGBA1C 5.1 01/31/2023      Component Value Date/Time   LABOPIA NONE DETECTED 01/31/2023 0039   COCAINSCRNUR NONE  DETECTED 01/31/2023 0039   LABBENZ NONE DETECTED 01/31/2023 0039   AMPHETMU NONE DETECTED 01/31/2023 0039   THCU NONE DETECTED 01/31/2023 0039   LABBARB NONE DETECTED 01/31/2023 0039    Recent Labs  Lab 01/31/23 0040  ETH <10    I have personally reviewed the radiological images below and agree with the radiology interpretations.  IR PERCUTANEOUS ART THROMBECTOMY/INFUSION INTRACRANIAL INC DIAG ANGIO  Result Date: 02/02/2023 INDICATION: History of repeated falls. Patient with new onset expressive aphasia and possible right sided weakness. 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 Also noted mid basilar artery occlusion. EXAM: 1. EMERGENT LARGE VESSEL OCCLUSION THROMBOLYSIS (anterior CIRCULATION) COMPARISON:  Recent CT angiogram of the head and neck, and MRI of the brain. MEDICATIONS: Ancef 2 g IV was administered within 1 hour of the procedure. ANESTHESIA/SEDATION: General anesthesia. CONTRAST:  Omnipaque 300 approximately 110 mL. FLUOROSCOPY TIME:  Fluoroscopy Time: 31 minutes 42 seconds (1110 mGy). COMPLICATIONS: None immediate. TECHNIQUE: Following a full explanation of the procedure along with the potential associated complications, an informed  witnessed consent was obtained. The risks of intracranial hemorrhage of 10%, worsening neurological deficit, ventilator dependency, death and inability to revascularize were all reviewed in detail with the patient. The patient was then put under general anesthesia by the Department of Anesthesiology at [facility]. The right groin was prepped and draped in the usual sterile fashion. Thereafter using modified Seldinger technique, transfemoral access into the right common femoral artery was obtained without difficulty. Over an 0.035 inch guidewire an 8 French 25 cm Pinnacle sheath was inserted. Through this, and also over an 0.035 inch guidewire a combination of a 125 cm 6  [place] Berenstein support catheter inside of an 087 95 cm balloon guide catheter was advanced to the aortic arch region and advanced to the distal left common carotid artery. The guidewire, and the support catheter were removed. Good aspiration was obtained from the hub of the balloon guide catheter. A control arteriogram was then performed centered extra cranially and intracranially. FINDINGS: The left common carotid arteriogram demonstrates the left external carotid artery and its major branches to be widely patent. The left internal carotid artery just distal to the bulb has a focal severe stenosis. 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 PROCEDURE: Through the balloon guide catheter in the distal left ICA, and over an 014 inch 300 cm exchange micro guidewire positioned in the horizontal petrous segment, control angioplasty of the proximal left internal carotid stenosis was performed with a 4 mm x 30 cm Viatrac 14 angioplasty balloon catheter which had been prepped with 50% contrast and 50% heparinized saline infusion. Using the rapid exchange technique, the balloon was positioned with its markers adequate distant from the site of severe stenosis. A control inflation was then performed using a micro inflation syringe device via micro tubing to approximately 4 mm where it was maintained for approximately a minute and a half. Thereafter, following deflation of the balloon and removal a control arteriogram performed through the balloon guide catheter demonstrated now improved caliber of the angioplastied segment. At this time a 071 Zoom aspiration catheter was advanced in combination with an 021 160 cm Trevo ProVue microcatheter over the exchange micro guidewire to the proximal petrous segment. The 300 cm 014 exchange micro guidewire was then exchanged for an 018  inch micro guidewire with a moderate J configuration distally. The micro guidewire with the microcatheter was then gently advanced without difficulty through the occluded supraclinoid left ICA to the middle cerebral artery inferior division M2 segment followed by the microcatheter. The guidewire was removed. Good aspiration obtained from the hub of the microcatheter. A gentle control arteriogram performed through this demonstrated safe positioning of the tip of the microcatheter. A 4 mm x 40 mm Solitaire X retrieval device was then deployed with the proximal portion in the supraclinoid left ICA. At this time the Zoom aspiration catheter was advanced without difficulty to the mid M1 segment. Control aspiration was then performed for approximately a minute and a half at the hub of the Zoom aspiration catheter, and with a 20 mL syringe at the hub of the balloon guide catheter for approximately a minute and a half. The combination was then retrieved and removed. Following reversal of flow arrest in the left internal carotid artery a control arteriogram performed demonstrated complete revascularization of the supraclinoid left ICA with opacification of the left  middle cerebral artery achieving a TICI 3 revascularization. Also noted was opacification of the proximal left A1 segment with focal areas of severe stenosis due to arteriosclerosis. Bilateral PCOMs demonstrated opacification into the left posterior cerebral artery P1 segment. Underlying intracranial arteriosclerosis with resulting severe stenosis was noted just proximal to the origins of the posterior communicating arteries. Through the balloon guide catheter in the distal left ICA, an aspiration 5 French 115 cm guide catheter was then advanced to the proximal cavernous segment over an 035 inch guidewire. Through this, 160 cm Trevo ProVue microcatheter was advanced over an 018 inch micro guidewire. The microcatheter and the micro guidewire were advanced to the  distal M2 M3 regions of the inferior division followed by the microcatheter. The guidewire was removed. Good aspiration obtained from the hub of the microcatheter. This in turn was exchanged for an 014 inch 300 cm exchange micro guidewire with a moderate J configuration at its distal end. A control arteriogram performed demonstrated safe positioning of the tip of the micro guidewire. Measurements were then performed of the left ICA supraclinoid segment just proximal to the bifurcation and just distal to the origin of the ophthalmic artery. A 3 mm x 12 mm Onyx Frontier balloon mounted stent which had been prepped antegradely with 50% contrast and 50% heparinized saline infusion, and retrogradely with heparinized saline infusion, was advanced in a coaxial manner and with constant heparinized saline infusion and positioned without difficulty with the stent markers adequate distant from the area of severe stenosis. A control inflation was then performed using a micro inflation syringe device via micro tubing to just over 3 mm x 2 where it was maintained for approximately a minute and a half. Following balloon deflation and retrieval and removal, a control arteriogram performed through the 5 [place] intermediate catheter demonstrated significantly improved caliber of the supraclinoid left ICA with a TICI 3 revascularization of the left MCA being persistent. Again demonstrated was the stenosed left anterior cerebral A1 segment. Over the exchange micro guidewire now in the proximal cavernous segment, a control arteriogram performed through the balloon guide catheter following removal of the intermediate catheter. Measurements were then performed of the left internal carotid artery proximally at the site of the significant stenosis and the distal left common carotid artery. A 6/8 mm x 40 mm Xact stent delivery apparatus was then advanced in a coaxial manner and with constant heparinized saline infusion and positioned without  difficulty with the adequate coverage distal to the angioplastied segment and into the distal left common carotid artery. The stent was then deployed in the usual manner without any difficulty. The delivery apparatus was removed. A control arteriogram performed through the balloon guide catheter demonstrated significantly improved caliber through the stented segment of the proximal left ICA. The waist at the site of the angioplasty was then treated with a 5 mm x 30 mm Viatrac angioplasty microcatheter which was advanced after its preparation as described above. Prior to the angioplasty, proximal flow arrest was initiated by inflating the balloon in the distal left common carotid artery, and with proximal aspiration during the angioplasty. Following the angioplasty, the balloon was retrieved and removed while aspiration was maintained as flow arrest was reversed. Control arteriograms were then performed through the balloon guide catheter over the next 10-20 minutes continued to demonstrate excellent flow through the centered in the proximal left ICA. Intracranially no change was seen in the TICI 3 revascularization of the left middle cerebral artery distribution. A 5 French diagnostic catheter  was then advanced into the right common carotid artery over an 035 inch guidewire. The right common carotid artery injection demonstrated patency of the right internal carotid artery at the neck, and intracranially. The petrous, the cavernous and the supraclinoid right ICA demonstrate wide patency. The right middle cerebral artery and the right anterior cerebral artery demonstrate wide patency into the capillary and venous phases with a 50% stenosis of the right middle cerebral artery M1 segment. Cross-filling via the anterior communicating artery of the left anterior cerebral A2 segment, and the left M1 segment was evident. An 8 French Angio-Seal closure device was deployed at the right groin puncture site for hemostasis.  Distal pulses remained present unchanged in both feet at the end of the procedure. Flat panel CT of the brain demonstrated no evidence of intracranial hemorrhage. Patient was extubated in was beginning to respond to simple commands appropriately slightly weaker on the right side. Medications utilized during the procedure. 81 mg of aspirin and Plavix 15 mg via orogastric tube prior to angioplasty. The patient was also given half bolus dose of cangrelor followed by a 4 hour half dose infusion to be followed by CT of the brain. Patient was then transferred to the neuro ICU for post revascularization care. 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 Underlying a significant atherosclerotic related stenosis treated with a 3 mm x 12 mm Onyx Frontier balloon mounted stent with significantly improved caliber and flow. Status post stent assisted angioplasty of proximal left ICA stenosis with proximal flow arrest as described without event. PLAN: Follow-up in the clinic 4 weeks post discharge. Electronically Signed   By: Julieanne Cotton M.D.   On: 02/02/2023 07:58   IR CT Head Ltd  Result Date: 02/02/2023 INDICATION: History of repeated falls. Patient with new onset expressive aphasia and possible right sided weakness. 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 Also noted mid basilar artery occlusion. EXAM: 1. EMERGENT LARGE VESSEL OCCLUSION THROMBOLYSIS (anterior CIRCULATION) COMPARISON:  Recent CT angiogram of the head and neck, and MRI of the brain. MEDICATIONS: Ancef 2 g IV was administered within 1 hour of the procedure. ANESTHESIA/SEDATION: General anesthesia. CONTRAST:  Omnipaque 300 approximately 110 mL. FLUOROSCOPY TIME:   Fluoroscopy Time: 31 minutes 42 seconds (1110 mGy). COMPLICATIONS: None immediate. TECHNIQUE: Following a full explanation of the procedure along with the potential associated complications, an informed witnessed consent was obtained. The risks of intracranial hemorrhage of 10%, worsening neurological deficit, ventilator dependency, death and inability to revascularize were all reviewed in detail with the patient. The patient was then put under general anesthesia by the Department of Anesthesiology at [facility]. The right groin was prepped and draped in the usual sterile fashion. Thereafter using modified Seldinger technique, transfemoral access into the right common femoral artery was obtained without difficulty. Over an 0.035 inch guidewire an 8 French 25 cm Pinnacle sheath was inserted. Through this, and also over an 0.035 inch guidewire a combination of a 125 cm 6 [place] Berenstein support catheter inside of an 087 95 cm balloon guide catheter was advanced to the aortic arch region and advanced to the distal left common carotid artery. The guidewire, and the support catheter were removed. Good aspiration was obtained from the hub of the balloon  guide catheter. A control arteriogram was then performed centered extra cranially and intracranially. FINDINGS: The left common carotid arteriogram demonstrates the left external carotid artery and its major branches to be widely patent. The left internal carotid artery just distal to the bulb has a focal severe stenosis. 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 PROCEDURE: Through the balloon guide catheter in the distal left ICA, and over an 014 inch 300 cm exchange micro guidewire positioned in the horizontal petrous segment, control angioplasty of the proximal left internal carotid stenosis was performed with a  4 mm x 30 cm Viatrac 14 angioplasty balloon catheter which had been prepped with 50% contrast and 50% heparinized saline infusion. Using the rapid exchange technique, the balloon was positioned with its markers adequate distant from the site of severe stenosis. A control inflation was then performed using a micro inflation syringe device via micro tubing to approximately 4 mm where it was maintained for approximately a minute and a half. Thereafter, following deflation of the balloon and removal a control arteriogram performed through the balloon guide catheter demonstrated now improved caliber of the angioplastied segment. At this time a 071 Zoom aspiration catheter was advanced in combination with an 021 160 cm Trevo ProVue microcatheter over the exchange micro guidewire to the proximal petrous segment. The 300 cm 014 exchange micro guidewire was then exchanged for an 018 inch micro guidewire with a moderate J configuration distally. The micro guidewire with the microcatheter was then gently advanced without difficulty through the occluded supraclinoid left ICA to the middle cerebral artery inferior division M2 segment followed by the microcatheter. The guidewire was removed. Good aspiration obtained from the hub of the microcatheter. A gentle control arteriogram performed through this demonstrated safe positioning of the tip of the microcatheter. A 4 mm x 40 mm Solitaire X retrieval device was then deployed with the proximal portion in the supraclinoid left ICA. At this time the Zoom aspiration catheter was advanced without difficulty to the mid M1 segment. Control aspiration was then performed for approximately a minute and a half at the hub of the Zoom aspiration catheter, and with a 20 mL syringe at the hub of the balloon guide catheter for approximately a minute and a half. The combination was then retrieved and removed. Following reversal of flow arrest in the left internal carotid artery a control  arteriogram performed demonstrated complete revascularization of the supraclinoid left ICA with opacification of the left middle cerebral artery achieving a TICI 3 revascularization. Also noted was opacification of the proximal left A1 segment with focal areas of severe stenosis due to arteriosclerosis. Bilateral PCOMs demonstrated opacification into the left posterior cerebral artery P1 segment. Underlying intracranial arteriosclerosis with resulting severe stenosis was noted just proximal to the origins of the posterior communicating arteries. Through the balloon guide catheter in the distal left ICA, an aspiration 5 French 115 cm guide catheter was then advanced to the proximal cavernous segment over an 035 inch guidewire. Through this, 160 cm Trevo ProVue microcatheter was advanced over an 018 inch micro guidewire. The microcatheter and the micro guidewire were advanced to the distal M2 M3 regions of the inferior division followed by the microcatheter. The guidewire was removed. Good aspiration obtained from the hub of the microcatheter. This in turn was exchanged for an 014 inch 300 cm exchange micro  guidewire with a moderate J configuration at its distal end. A control arteriogram performed demonstrated safe positioning of the tip of the micro guidewire. Measurements were then performed of the left ICA supraclinoid segment just proximal to the bifurcation and just distal to the origin of the ophthalmic artery. A 3 mm x 12 mm Onyx Frontier balloon mounted stent which had been prepped antegradely with 50% contrast and 50% heparinized saline infusion, and retrogradely with heparinized saline infusion, was advanced in a coaxial manner and with constant heparinized saline infusion and positioned without difficulty with the stent markers adequate distant from the area of severe stenosis. A control inflation was then performed using a micro inflation syringe device via micro tubing to just over 3 mm x 2 where it was  maintained for approximately a minute and a half. Following balloon deflation and retrieval and removal, a control arteriogram performed through the 5 [place] intermediate catheter demonstrated significantly improved caliber of the supraclinoid left ICA with a TICI 3 revascularization of the left MCA being persistent. Again demonstrated was the stenosed left anterior cerebral A1 segment. Over the exchange micro guidewire now in the proximal cavernous segment, a control arteriogram performed through the balloon guide catheter following removal of the intermediate catheter. Measurements were then performed of the left internal carotid artery proximally at the site of the significant stenosis and the distal left common carotid artery. A 6/8 mm x 40 mm Xact stent delivery apparatus was then advanced in a coaxial manner and with constant heparinized saline infusion and positioned without difficulty with the adequate coverage distal to the angioplastied segment and into the distal left common carotid artery. The stent was then deployed in the usual manner without any difficulty. The delivery apparatus was removed. A control arteriogram performed through the balloon guide catheter demonstrated significantly improved caliber through the stented segment of the proximal left ICA. The waist at the site of the angioplasty was then treated with a 5 mm x 30 mm Viatrac angioplasty microcatheter which was advanced after its preparation as described above. Prior to the angioplasty, proximal flow arrest was initiated by inflating the balloon in the distal left common carotid artery, and with proximal aspiration during the angioplasty. Following the angioplasty, the balloon was retrieved and removed while aspiration was maintained as flow arrest was reversed. Control arteriograms were then performed through the balloon guide catheter over the next 10-20 minutes continued to demonstrate excellent flow through the centered in the  proximal left ICA. Intracranially no change was seen in the TICI 3 revascularization of the left middle cerebral artery distribution. A 5 French diagnostic catheter was then advanced into the right common carotid artery over an 035 inch guidewire. The right common carotid artery injection demonstrated patency of the right internal carotid artery at the neck, and intracranially. The petrous, the cavernous and the supraclinoid right ICA demonstrate wide patency. The right middle cerebral artery and the right anterior cerebral artery demonstrate wide patency into the capillary and venous phases with a 50% stenosis of the right middle cerebral artery M1 segment. Cross-filling via the anterior communicating artery of the left anterior cerebral A2 segment, and the left M1 segment was evident. An 8 French Angio-Seal closure device was deployed at the right groin puncture site for hemostasis. Distal pulses remained present unchanged in both feet at the end of the procedure. Flat panel CT of the brain demonstrated no evidence of intracranial hemorrhage. Patient was extubated in was beginning to respond to simple commands appropriately slightly  weaker on the right side. Medications utilized during the procedure. 81 mg of aspirin and Plavix 15 mg via orogastric tube prior to angioplasty. The patient was also given half bolus dose of cangrelor followed by a 4 hour half dose infusion to be followed by CT of the brain. Patient was then transferred to the neuro ICU for post revascularization care. 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 Underlying a significant atherosclerotic related stenosis treated with a 3 mm x 12 mm Onyx Frontier balloon mounted stent with significantly improved caliber and flow. Status post stent  assisted angioplasty of proximal left ICA stenosis with proximal flow arrest as described without event. PLAN: Follow-up in the clinic 4 weeks post discharge. Electronically Signed   By: Julieanne Cotton M.D.   On: 02/02/2023 07:58   IR INTRAVSC STENT CERV CAROTID W/O EMB-PROT MOD SED  Result Date: 02/02/2023 INDICATION: History of repeated falls. Patient with new onset expressive aphasia and possible right sided weakness. 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 Also noted mid basilar artery occlusion. EXAM: 1. EMERGENT LARGE VESSEL OCCLUSION THROMBOLYSIS (anterior CIRCULATION) COMPARISON:  Recent CT angiogram of the head and neck, and MRI of the brain. MEDICATIONS: Ancef 2 g IV was administered within 1 hour of the procedure. ANESTHESIA/SEDATION: General anesthesia. CONTRAST:  Omnipaque 300 approximately 110 mL. FLUOROSCOPY TIME:  Fluoroscopy Time: 31 minutes 42 seconds (1110 mGy). COMPLICATIONS: None immediate. TECHNIQUE: Following a full explanation of the procedure along with the potential associated complications, an informed witnessed consent was obtained. The risks of intracranial hemorrhage of 10%, worsening neurological deficit, ventilator dependency, death and inability to revascularize were all reviewed in detail with the patient. The patient was then put under general anesthesia by the Department of Anesthesiology at [facility]. The right groin was prepped and draped in the usual sterile fashion. Thereafter using modified Seldinger technique, transfemoral access into the right common femoral artery was obtained without difficulty. Over an 0.035 inch guidewire an 8 French 25 cm Pinnacle sheath was inserted. Through this, and also over an 0.035 inch guidewire a combination of a 125 cm 6 [place] Berenstein support catheter inside of an 087 95 cm balloon guide catheter was advanced to the aortic arch region and  advanced to the distal left common carotid artery. The guidewire, and the support catheter were removed. Good aspiration was obtained from the hub of the balloon guide catheter. A control arteriogram was then performed centered extra cranially and intracranially. FINDINGS: The left common carotid arteriogram demonstrates the left external carotid artery and its major branches to be widely patent. The left internal carotid artery just distal to the bulb has a focal severe stenosis. 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 PROCEDURE: Through the balloon guide catheter in the distal left ICA, and over an 014 inch 300 cm exchange micro guidewire positioned in the horizontal petrous segment, control angioplasty of the proximal left internal carotid stenosis was performed with a 4 mm x 30 cm Viatrac 14 angioplasty balloon catheter which had been prepped with 50% contrast and 50% heparinized saline infusion. Using the  rapid exchange technique, the balloon was positioned with its markers adequate distant from the site of severe stenosis. A control inflation was then performed using a micro inflation syringe device via micro tubing to approximately 4 mm where it was maintained for approximately a minute and a half. Thereafter, following deflation of the balloon and removal a control arteriogram performed through the balloon guide catheter demonstrated now improved caliber of the angioplastied segment. At this time a 071 Zoom aspiration catheter was advanced in combination with an 021 160 cm Trevo ProVue microcatheter over the exchange micro guidewire to the proximal petrous segment. The 300 cm 014 exchange micro guidewire was then exchanged for an 018 inch micro guidewire with a moderate J configuration distally. The micro guidewire with the microcatheter was then gently  advanced without difficulty through the occluded supraclinoid left ICA to the middle cerebral artery inferior division M2 segment followed by the microcatheter. The guidewire was removed. Good aspiration obtained from the hub of the microcatheter. A gentle control arteriogram performed through this demonstrated safe positioning of the tip of the microcatheter. A 4 mm x 40 mm Solitaire X retrieval device was then deployed with the proximal portion in the supraclinoid left ICA. At this time the Zoom aspiration catheter was advanced without difficulty to the mid M1 segment. Control aspiration was then performed for approximately a minute and a half at the hub of the Zoom aspiration catheter, and with a 20 mL syringe at the hub of the balloon guide catheter for approximately a minute and a half. The combination was then retrieved and removed. Following reversal of flow arrest in the left internal carotid artery a control arteriogram performed demonstrated complete revascularization of the supraclinoid left ICA with opacification of the left middle cerebral artery achieving a TICI 3 revascularization. Also noted was opacification of the proximal left A1 segment with focal areas of severe stenosis due to arteriosclerosis. Bilateral PCOMs demonstrated opacification into the left posterior cerebral artery P1 segment. Underlying intracranial arteriosclerosis with resulting severe stenosis was noted just proximal to the origins of the posterior communicating arteries. Through the balloon guide catheter in the distal left ICA, an aspiration 5 French 115 cm guide catheter was then advanced to the proximal cavernous segment over an 035 inch guidewire. Through this, 160 cm Trevo ProVue microcatheter was advanced over an 018 inch micro guidewire. The microcatheter and the micro guidewire were advanced to the distal M2 M3 regions of the inferior division followed by the microcatheter. The guidewire was removed. Good aspiration  obtained from the hub of the microcatheter. This in turn was exchanged for an 014 inch 300 cm exchange micro guidewire with a moderate J configuration at its distal end. A control arteriogram performed demonstrated safe positioning of the tip of the micro guidewire. Measurements were then performed of the left ICA supraclinoid segment just proximal to the bifurcation and just distal to the origin of the ophthalmic artery. A 3 mm x 12 mm Onyx Frontier balloon mounted stent which had been prepped antegradely with 50% contrast and 50% heparinized saline infusion, and retrogradely with heparinized saline infusion, was advanced in a coaxial manner and with constant heparinized saline infusion and positioned without difficulty with the stent markers adequate distant from the area of severe stenosis. A control inflation was then performed using a micro inflation syringe device via micro tubing to just over 3 mm x 2 where it was maintained for approximately a minute and a half. Following balloon deflation and retrieval and removal,  a control arteriogram performed through the 5 [place] intermediate catheter demonstrated significantly improved caliber of the supraclinoid left ICA with a TICI 3 revascularization of the left MCA being persistent. Again demonstrated was the stenosed left anterior cerebral A1 segment. Over the exchange micro guidewire now in the proximal cavernous segment, a control arteriogram performed through the balloon guide catheter following removal of the intermediate catheter. Measurements were then performed of the left internal carotid artery proximally at the site of the significant stenosis and the distal left common carotid artery. A 6/8 mm x 40 mm Xact stent delivery apparatus was then advanced in a coaxial manner and with constant heparinized saline infusion and positioned without difficulty with the adequate coverage distal to the angioplastied segment and into the distal left common carotid  artery. The stent was then deployed in the usual manner without any difficulty. The delivery apparatus was removed. A control arteriogram performed through the balloon guide catheter demonstrated significantly improved caliber through the stented segment of the proximal left ICA. The waist at the site of the angioplasty was then treated with a 5 mm x 30 mm Viatrac angioplasty microcatheter which was advanced after its preparation as described above. Prior to the angioplasty, proximal flow arrest was initiated by inflating the balloon in the distal left common carotid artery, and with proximal aspiration during the angioplasty. Following the angioplasty, the balloon was retrieved and removed while aspiration was maintained as flow arrest was reversed. Control arteriograms were then performed through the balloon guide catheter over the next 10-20 minutes continued to demonstrate excellent flow through the centered in the proximal left ICA. Intracranially no change was seen in the TICI 3 revascularization of the left middle cerebral artery distribution. A 5 French diagnostic catheter was then advanced into the right common carotid artery over an 035 inch guidewire. The right common carotid artery injection demonstrated patency of the right internal carotid artery at the neck, and intracranially. The petrous, the cavernous and the supraclinoid right ICA demonstrate wide patency. The right middle cerebral artery and the right anterior cerebral artery demonstrate wide patency into the capillary and venous phases with a 50% stenosis of the right middle cerebral artery M1 segment. Cross-filling via the anterior communicating artery of the left anterior cerebral A2 segment, and the left M1 segment was evident. An 8 French Angio-Seal closure device was deployed at the right groin puncture site for hemostasis. Distal pulses remained present unchanged in both feet at the end of the procedure. Flat panel CT of the brain  demonstrated no evidence of intracranial hemorrhage. Patient was extubated in was beginning to respond to simple commands appropriately slightly weaker on the right side. Medications utilized during the procedure. 81 mg of aspirin and Plavix 15 mg via orogastric tube prior to angioplasty. The patient was also given half bolus dose of cangrelor followed by a 4 hour half dose infusion to be followed by CT of the brain. Patient was then transferred to the neuro ICU for post revascularization care. 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 Underlying a significant atherosclerotic related stenosis treated with a 3 mm x 12 mm Onyx Frontier balloon mounted stent with significantly improved caliber and flow. Status post stent assisted angioplasty of proximal left ICA stenosis with proximal flow arrest as described without event. PLAN: Follow-up in  the clinic 4 weeks post discharge. Electronically Signed   By: Julieanne Cotton M.D.   On: 02/02/2023 07:58   IR Intra Cran Stent  Result Date: 02/02/2023 INDICATION: History of repeated falls. Patient with new onset expressive aphasia and possible right sided weakness. 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 Also noted mid basilar artery occlusion. EXAM: 1. EMERGENT LARGE VESSEL OCCLUSION THROMBOLYSIS (anterior CIRCULATION) COMPARISON:  Recent CT angiogram of the head and neck, and MRI of the brain. MEDICATIONS: Ancef 2 g IV was administered within 1 hour of the procedure. ANESTHESIA/SEDATION: General anesthesia. CONTRAST:  Omnipaque 300 approximately 110 mL. FLUOROSCOPY TIME:  Fluoroscopy Time: 31 minutes 42 seconds (1110 mGy). COMPLICATIONS: None immediate. TECHNIQUE: Following a full  explanation of the procedure along with the potential associated complications, an informed witnessed consent was obtained. The risks of intracranial hemorrhage of 10%, worsening neurological deficit, ventilator dependency, death and inability to revascularize were all reviewed in detail with the patient. The patient was then put under general anesthesia by the Department of Anesthesiology at [facility]. The right groin was prepped and draped in the usual sterile fashion. Thereafter using modified Seldinger technique, transfemoral access into the right common femoral artery was obtained without difficulty. Over an 0.035 inch guidewire an 8 French 25 cm Pinnacle sheath was inserted. Through this, and also over an 0.035 inch guidewire a combination of a 125 cm 6 [place] Berenstein support catheter inside of an 087 95 cm balloon guide catheter was advanced to the aortic arch region and advanced to the distal left common carotid artery. The guidewire, and the support catheter were removed. Good aspiration was obtained from the hub of the balloon guide catheter. A control arteriogram was then performed centered extra cranially and intracranially. FINDINGS: The left common carotid arteriogram demonstrates the left external carotid artery and its major branches to be widely patent. The left internal carotid artery just distal to the bulb has a focal severe stenosis. 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 PROCEDURE: Through the balloon guide catheter in the distal left ICA, and over an 014 inch 300 cm exchange micro guidewire positioned in the horizontal petrous segment, control angioplasty of the proximal left internal carotid stenosis was performed with a 4 mm x 30 cm Viatrac 14 angioplasty balloon catheter which had been prepped with 50% contrast and 50%  heparinized saline infusion. Using the rapid exchange technique, the balloon was positioned with its markers adequate distant from the site of severe stenosis. A control inflation was then performed using a micro inflation syringe device via micro tubing to approximately 4 mm where it was maintained for approximately a minute and a half. Thereafter, following deflation of the balloon and removal a control arteriogram performed through the balloon guide catheter demonstrated now improved caliber of the angioplastied segment. At this time a 071 Zoom aspiration catheter was advanced in combination with an 021 160 cm Trevo ProVue microcatheter over the exchange micro guidewire to the proximal petrous segment. The 300 cm 014 exchange micro guidewire was then exchanged for an 018 inch micro guidewire with a moderate J configuration distally. The micro guidewire with the microcatheter was then gently advanced without difficulty through the occluded supraclinoid left ICA to the middle cerebral artery inferior division M2 segment  followed by the microcatheter. The guidewire was removed. Good aspiration obtained from the hub of the microcatheter. A gentle control arteriogram performed through this demonstrated safe positioning of the tip of the microcatheter. A 4 mm x 40 mm Solitaire X retrieval device was then deployed with the proximal portion in the supraclinoid left ICA. At this time the Zoom aspiration catheter was advanced without difficulty to the mid M1 segment. Control aspiration was then performed for approximately a minute and a half at the hub of the Zoom aspiration catheter, and with a 20 mL syringe at the hub of the balloon guide catheter for approximately a minute and a half. The combination was then retrieved and removed. Following reversal of flow arrest in the left internal carotid artery a control arteriogram performed demonstrated complete revascularization of the supraclinoid left ICA with opacification  of the left middle cerebral artery achieving a TICI 3 revascularization. Also noted was opacification of the proximal left A1 segment with focal areas of severe stenosis due to arteriosclerosis. Bilateral PCOMs demonstrated opacification into the left posterior cerebral artery P1 segment. Underlying intracranial arteriosclerosis with resulting severe stenosis was noted just proximal to the origins of the posterior communicating arteries. Through the balloon guide catheter in the distal left ICA, an aspiration 5 French 115 cm guide catheter was then advanced to the proximal cavernous segment over an 035 inch guidewire. Through this, 160 cm Trevo ProVue microcatheter was advanced over an 018 inch micro guidewire. The microcatheter and the micro guidewire were advanced to the distal M2 M3 regions of the inferior division followed by the microcatheter. The guidewire was removed. Good aspiration obtained from the hub of the microcatheter. This in turn was exchanged for an 014 inch 300 cm exchange micro guidewire with a moderate J configuration at its distal end. A control arteriogram performed demonstrated safe positioning of the tip of the micro guidewire. Measurements were then performed of the left ICA supraclinoid segment just proximal to the bifurcation and just distal to the origin of the ophthalmic artery. A 3 mm x 12 mm Onyx Frontier balloon mounted stent which had been prepped antegradely with 50% contrast and 50% heparinized saline infusion, and retrogradely with heparinized saline infusion, was advanced in a coaxial manner and with constant heparinized saline infusion and positioned without difficulty with the stent markers adequate distant from the area of severe stenosis. A control inflation was then performed using a micro inflation syringe device via micro tubing to just over 3 mm x 2 where it was maintained for approximately a minute and a half. Following balloon deflation and retrieval and removal, a  control arteriogram performed through the 5 [place] intermediate catheter demonstrated significantly improved caliber of the supraclinoid left ICA with a TICI 3 revascularization of the left MCA being persistent. Again demonstrated was the stenosed left anterior cerebral A1 segment. Over the exchange micro guidewire now in the proximal cavernous segment, a control arteriogram performed through the balloon guide catheter following removal of the intermediate catheter. Measurements were then performed of the left internal carotid artery proximally at the site of the significant stenosis and the distal left common carotid artery. A 6/8 mm x 40 mm Xact stent delivery apparatus was then advanced in a coaxial manner and with constant heparinized saline infusion and positioned without difficulty with the adequate coverage distal to the angioplastied segment and into the distal left common carotid artery. The stent was then deployed in the usual manner without any difficulty. The delivery apparatus was removed.  A control arteriogram performed through the balloon guide catheter demonstrated significantly improved caliber through the stented segment of the proximal left ICA. The waist at the site of the angioplasty was then treated with a 5 mm x 30 mm Viatrac angioplasty microcatheter which was advanced after its preparation as described above. Prior to the angioplasty, proximal flow arrest was initiated by inflating the balloon in the distal left common carotid artery, and with proximal aspiration during the angioplasty. Following the angioplasty, the balloon was retrieved and removed while aspiration was maintained as flow arrest was reversed. Control arteriograms were then performed through the balloon guide catheter over the next 10-20 minutes continued to demonstrate excellent flow through the centered in the proximal left ICA. Intracranially no change was seen in the TICI 3 revascularization of the left middle cerebral  artery distribution. A 5 French diagnostic catheter was then advanced into the right common carotid artery over an 035 inch guidewire. The right common carotid artery injection demonstrated patency of the right internal carotid artery at the neck, and intracranially. The petrous, the cavernous and the supraclinoid right ICA demonstrate wide patency. The right middle cerebral artery and the right anterior cerebral artery demonstrate wide patency into the capillary and venous phases with a 50% stenosis of the right middle cerebral artery M1 segment. Cross-filling via the anterior communicating artery of the left anterior cerebral A2 segment, and the left M1 segment was evident. An 8 French Angio-Seal closure device was deployed at the right groin puncture site for hemostasis. Distal pulses remained present unchanged in both feet at the end of the procedure. Flat panel CT of the brain demonstrated no evidence of intracranial hemorrhage. Patient was extubated in was beginning to respond to simple commands appropriately slightly weaker on the right side. Medications utilized during the procedure. 81 mg of aspirin and Plavix 15 mg via orogastric tube prior to angioplasty. The patient was also given half bolus dose of cangrelor followed by a 4 hour half dose infusion to be followed by CT of the brain. Patient was then transferred to the neuro ICU for post revascularization care. 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 Underlying a significant atherosclerotic related stenosis treated with a 3 mm x 12 mm Onyx Frontier balloon mounted stent with significantly improved caliber and flow. Status post stent assisted angioplasty of proximal left ICA stenosis with proximal flow arrest as described without event. PLAN:  Follow-up in the clinic 4 weeks post discharge. Electronically Signed   By: Julieanne Cotton M.D.   On: 02/02/2023 07:58   ECHOCARDIOGRAM COMPLETE  Result Date: 02/01/2023    ECHOCARDIOGRAM REPORT   Patient Name:   Grant Berry Date of Exam: 02/01/2023 Medical Rec #:  [id number]          Height:       67.0 in Accession #:    [id number]         Weight:       119.3 lb Date of Birth:  09-24-1945          BSA:          1.623 m Patient Age:    77 years           BP:           172/87 mmHg Patient Gender: M  HR:           92 bpm. Exam Location:  Inpatient Procedure: 2D Echo, Cardiac Doppler and Color Doppler Indications:    Stroke  History:        Patient has prior history of Echocardiogram examinations, most                 recent 09/02/2019. Stroke; Risk Factors:Hypertension and                 Dyslipidemia.  Sonographer:    Milda Smart Referring Phys: [id number] [facility] [place]  Sonographer Comments: Suboptimal parasternal window. Image acquisition challenging due to patient body habitus and Image acquisition challenging due to respiratory motion. IMPRESSIONS  1. Left ventricular ejection fraction, by estimation, is 60 to 65%. The left ventricle has normal function. The left ventricle has no regional wall motion abnormalities. Left ventricular diastolic parameters are consistent with Grade I diastolic dysfunction (impaired relaxation).  2. Right ventricular systolic function is normal. The right ventricular size is normal.  3. The mitral valve is abnormal. No evidence of mitral valve regurgitation. No evidence of mitral stenosis.  4. The aortic valve was not well visualized. There is mild calcification of the aortic valve. There is mild thickening of the aortic valve. Aortic valve regurgitation is not visualized. Aortic valve sclerosis is present, with no evidence of aortic valve  stenosis.  5. The inferior vena cava is normal in size with greater than 50% respiratory variability, suggesting  right atrial pressure of 3 mmHg. FINDINGS  Left Ventricle: Left ventricular ejection fraction, by estimation, is 60 to 65%. The left ventricle has normal function. The left ventricle has no regional wall motion abnormalities. The left ventricular internal cavity size was normal in size. There is  no left ventricular hypertrophy. Left ventricular diastolic parameters are consistent with Grade I diastolic dysfunction (impaired relaxation). Right Ventricle: The right ventricular size is normal. No increase in right ventricular wall thickness. Right ventricular systolic function is normal. Left Atrium: Left atrial size was normal in size. Right Atrium: Right atrial size was normal in size. Pericardium: There is no evidence of pericardial effusion. Mitral Valve: The mitral valve is abnormal. There is mild thickening of the mitral valve leaflet(s). There is mild calcification of the mitral valve leaflet(s). Mild mitral annular calcification. No evidence of mitral valve regurgitation. No evidence of mitral valve stenosis. MV peak gradient, 5.8 mmHg. The mean mitral valve gradient is 3.0 mmHg. Tricuspid Valve: The tricuspid valve is normal in structure. Tricuspid valve regurgitation is not demonstrated. No evidence of tricuspid stenosis. Aortic Valve: The aortic valve was not well visualized. There is mild calcification of the aortic valve. There is mild thickening of the aortic valve. Aortic valve regurgitation is not visualized. Aortic valve sclerosis is present, with no evidence of aortic valve stenosis. Pulmonic Valve: The pulmonic valve was normal in structure. Pulmonic valve regurgitation is not visualized. No evidence of pulmonic stenosis. Aorta: The aortic root is normal in size and structure. Venous: The inferior vena cava is normal in size with greater than 50% respiratory variability, suggesting right atrial pressure of 3 mmHg. IAS/Shunts: No atrial level shunt detected by color flow Doppler.  LEFT VENTRICLE PLAX  2D LVIDd:         4.40 cm   Diastology LVIDs:         3.20 cm   LV e' medial:    6.20 cm/s LV PW:         0.80 cm  LV E/e' medial:  13.4 LV IVS:        0.60 cm   LV e' lateral:   7.94 cm/s LVOT diam:     2.50 cm   LV E/e' lateral: 10.5 LVOT Area:     4.91 cm  RIGHT VENTRICLE             IVC RV S prime:     17.80 cm/s  IVC diam: 1.40 cm TAPSE (M-mode): 2.5 cm LEFT ATRIUM             Index        RIGHT ATRIUM           Index LA diam:        3.30 cm 2.03 cm/m   RA Area:     11.70 cm LA Vol (A2C):   19.1 ml 11.77 ml/m  RA Volume:   26.80 ml  16.51 ml/m LA Vol (A4C):   27.2 ml 16.76 ml/m LA Biplane Vol: 23.8 ml 14.66 ml/m   AORTA Ao Root diam: 3.60 cm Ao Asc diam:  3.40 cm MITRAL VALVE MV Area (PHT): 1.73 cm     SHUNTS MV Peak grad:  5.8 mmHg     Systemic Diam: 2.50 cm MV Mean grad:  3.0 mmHg MV Vmax:       1.20 m/s MV Vmean:      83.8 cm/s MV Decel Time: 438 msec MV E velocity: 83.30 cm/s MV A velocity: 108.00 cm/s MV E/A ratio:  0.77 Charlton Haws MD Electronically signed by Charlton Haws MD Signature Date/Time: 02/01/2023/3:01:07 PM    Final    MR BRAIN WO CONTRAST  Result Date: 02/01/2023 CLINICAL DATA:  Stroke and embolectomy. EXAM: MRI HEAD WITHOUT CONTRAST TECHNIQUE: Multiplanar, multiecho pulse sequences of the brain and surrounding structures were obtained without intravenous contrast. COMPARISON:  Head CT and MRI from yesterday FINDINGS: Brain: Moderate acute infarct in the left occipital cortex, increased from yesterday. Punctate acute infarct in the superior left frontal white matter since prior. There are areas of low-grade restricted diffusion in the left periatrial white matter and right occipital pole attributed to subacute ischemia. Subarachnoid hemorrhage along the anterior left frontal convexity, distinct from any areas of acute infarction and known from prior head CT. Pre-existing bilateral cerebellar and brainstem small infarcts. Chronic lacunar infarcts in theright caudate. Ischemic  gliosis is mild. 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 Generalized brain atrophy. Vascular: No flow seen in the left vertebral artery. There is a basilar flow void. Skull and upper cervical spine: No focal marrow lesion Sinuses/Orbits: No acute finding IMPRESSION: 1. Moderate acute infarct in the left occipital lobe with mild progression from yesterday. Interval punctate acute infarct in the left frontal white matter. 2. Areas of chronic and subacute ischemia preferentially affecting the posterior circulation. 3. Known subarachnoid hemorrhage at the anterior left frontal convexity, stable from preceding head CT. Electronically Signed   By: Tiburcio Pea M.D.   On: 02/01/2023 04:58   CT HEAD WO CONTRAST ( )  Result Date: 01/31/2023 CLINICAL DATA:  Stroke, follow-up; post canrelor infusion EXAM: CT HEAD WITHOUT CONTRAST TECHNIQUE: Contiguous axial images were obtained from the base of the skull through the vertex without intravenous contrast. RADIATION DOSE REDUCTION: This exam was performed according to the departmental dose-optimization program which includes automated exposure control, adjustment of the mA and/or kV according to patient size and/or use of iterative reconstruction technique. COMPARISON:  CT head 10/01/2022 and intra procedural  CT 10/01/2022 FINDINGS: Brain: Hyperdense material in the left frontal sulci (series 5, image 47 and 60 and series 3, images 15-18 and 25 area and series 3, image 25), which may represent subarachnoid hemorrhage versus contrast staining. This was not apparent on the immediate postprocedural CT head. Redemonstrated cytotoxic edema in the in the left occipital lobe (series 3, image 14), which is slightly more prominent than on the prior exam. More heterogeneous hypodensity in the right occipital lobe, which is favored to be chronic remote infarcts in the pons and bilateral cerebellar hemispheres. No evidence of acute  infarction, hemorrhage, mass, mass effect, or midline shift. No hydrocephalus or extra-axial fluid collection. Vascular: No hyperdense vessel. Atherosclerotic calcifications in the intracranial carotid and vertebral arteries. Skull: Negative for fracture or focal lesion. Sinuses/Orbits: Mucosal thickening in the ethmoid air cells. No acute finding in the orbits. Status post bilateral lens replacements. Other: The mastoid air cells are well aerated. IMPRESSION: 1. Hyperdense material in the left frontal sulci, which may represent subarachnoid hemorrhage versus contrast staining. This was not apparent on the immediate postprocedural CT head. Recommend short-term follow-up head CT. 2. Redemonstrated cytotoxic edema in the left occipital lobe, which is slightly more prominent than on the prior exam. These results will be called to the ordering clinician or representative by the Radiologist Assistant, and communication documented in the PACS or [employer]. Electronically Signed   By: Wiliam Ke M.D.   On: 01/31/2023 20:52   CT CEREBRAL PERFUSION W CONTRAST  Result Date: 01/31/2023 CLINICAL DATA:  Code stroke. 77 year old Berry. CTA yesterday demonstrating left ICA terminus, distal left vertebral artery, Basilar artery occlusions. EXAM: CT PERFUSION BRAIN TECHNIQUE: Multiphase CT imaging of the brain was performed following IV bolus contrast injection. Subsequent parametric perfusion maps were calculated using RAPID software. RADIATION DOSE REDUCTION: This exam was performed according to the departmental dose-optimization program which includes automated exposure control, adjustment of the mA and/or kV according to patient size and/or use of iterative reconstruction technique. CONTRAST:  40mL OMNIPAQUE IOHEXOL 350 MG/ML SOLN COMPARISON:  Head CTs, CTA head and neck, and brain MRI earlier today. FINDINGS: CT Brain Perfusion Findings: CBF (<30%) Volume: 0mL. No CBF or CBV parameter abnormality is detected.  Perfusion (Tmax>6.0s) volume: . Fairly symmetric abnormal T-max throughout the bilateral cerebellum, posterior cerebral hemispheres. T-max > 4 seconds is more pronounced in the left cerebral hemisphere including the left MCA territory. There is volume of minimal T-max > 10 seconds in the right posterior circulation. Mismatch Volume: Infarct Core: No infarct core is detected Infarction Location:Oligemia throughout the posterior circulation, to a lesser extent the left MCA territory. IMPRESSION: Oligemia throughout the bilateral posterior circulation, including the posterior fossa. Less pronounced oligemia in the Left MCA territory. No infarct core, CBF or CBV parameter abnormality detected by CTP. These results were communicated to Dr. Roda Shutters at 8:12 am on 01/31/2023 by text page via the [facility] messaging system. Electronically Signed   By: Odessa Fleming M.D.   On: 01/31/2023 08:12   CT HEAD CODE STROKE WO CONTRAST  Result Date: 01/31/2023 CLINICAL DATA:  Code stroke. 77 year old Berry. CTA yesterday demonstrating left ICA terminus, distal left vertebral artery, Basilar artery occlusions. EXAM: CT HEAD WITHOUT CONTRAST TECHNIQUE: Contiguous axial images were obtained from the base of the skull through the vertex without intravenous contrast. RADIATION DOSE REDUCTION: This exam was performed according to the departmental dose-optimization program which includes automated exposure control, adjustment of the mA and/or kV according to patient size and/or use  of iterative reconstruction technique. COMPARISON:  Brain MRI 0244 hours today. CTA head and neck 0056 hours today. Plain head CT 0050 hours today. FINDINGS: Brain: Left occipital pole cytotoxic edema is slightly more conspicuous on CT now. Heterogeneous right occipital pole redemonstrated. Chronic left central pontine and bilateral cerebellar infarcts on MRI appears stable by CT. No acute intracranial hemorrhage identified. No new cytotoxic edema compared to the  earlier MRI. No midline shift, mass effect, or evidence of intracranial mass lesion. No ventriculomegaly. Vascular: Extensive Calcified atherosclerosis at the skull base. See also CTA earlier today. Skull: Stable and intact. Sinuses/Orbits: Visualized paranasal sinuses and mastoids are clear. Other: No acute orbit or scalp soft tissue finding. ASPECTS [facility] Stroke Program Early CT Score) Total score (0-10 with 10 being normal): 10; occipital pole cytotoxic edema. IMPRESSION: 1. Stable non contrast CT appearance of the brain since 0050 hours today: Occipital pole Cytotoxic edema stable from the MRI at 0242 hours today. No new edema, no intracranial hemorrhage, or mass effect. 2. 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 Electronically Signed   By: Odessa Fleming M.D.   On: 01/31/2023 08:08   MR BRAIN WO CONTRAST  Result Date: 01/31/2023 CLINICAL DATA:  Dizziness and gait instability. Large vessel occlusion. EXAM: MRI HEAD WITHOUT CONTRAST TECHNIQUE: Multiplanar, multiecho pulse sequences of the brain and surrounding structures were obtained without intravenous contrast. COMPARISON:  CTA head neck 01/31/2023 Brain MRI 09/01/2019 FINDINGS: Brain: There are areas of acute/early subacute ischemia within both occipital lobes, left-greater-than-right. These areas correspond to the areas of infarction seen on the earlier noncontrast head CT (suggested to be chronic at that time). There is no acute ischemia of the brainstem, cerebellum or anterior circulation. There is multifocal hyperintense T2-weighted signal within the white matter. Generalized volume loss. There is hyperintense T2-weighted signal that corresponds to both sites of infarction. There is a component of chronic infarct within the superior right occipital lobe (series 11, image 25). There are multiple old cerebellar infarcts. The midline structures are normal. Vascular: Abnormal distal basilar artery flow void in keeping with known occlusion.  Abnormal flow void at the skull base left ICA covers a longer segment than the demonstrated occlusion on the earlier CTA, likely due to inhibited upstream flow. Skull and upper cervical spine: Normal calvarium and skull base. Visualized upper cervical spine and soft tissues are normal. Sinuses/Orbits:No paranasal sinus fluid levels or advanced mucosal thickening. No mastoid or middle ear effusion. Normal orbits. IMPRESSION: 1. Acute/early subacute infarcts within both occipital lobes, left-greater-than-right. 2. No brainstem, cerebellar or anterior circulation acute ischemia. 3. Abnormal skull base flow voids in keeping with known vascular and left ICA occlusions. 4. Old superior right occipital and bilateral cerebellar infarcts. Electronically Signed   By: Deatra Robinson M.D.   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 There is reconstitution of the basilar tip with patency of both PCAs maintained. The superior cerebellar arteries are occluded at their origin. Please note that this addendum was previously mistakenly associated with the noncontrast head CT for this patient performed on the same day. Electronically Signed   By: Deatra Robinson M.D.   On: 01/31/2023 02:45   Result Date: 01/31/2023 CLINICAL DATA:  Fall with dysarthria and dizziness EXAM: CT ANGIOGRAPHY HEAD AND NECK WITH AND WITHOUT CONTRAST TECHNIQUE: Multidetector CT  imaging of the head and neck was performed using the standard protocol during bolus administration of intravenous contrast. Multiplanar CT image reconstructions and MIPs were obtained to evaluate the vascular anatomy. Carotid stenosis measurements (when applicable) are obtained utilizing NASCET criteria, using the distal internal carotid diameter as the denominator. RADIATION DOSE REDUCTION: This exam  was performed according to the departmental dose-optimization program which includes automated exposure control, adjustment of the mA and/or kV according to patient size and/or use of iterative reconstruction technique. CONTRAST:  75mL OMNIPAQUE IOHEXOL 350 MG/ML SOLN COMPARISON:  None Available. FINDINGS: CTA NECK FINDINGS SKELETON: No acute abnormality or high grade bony spinal canal stenosis. OTHER NECK: Normal pharynx, larynx and major salivary glands. No cervical lymphadenopathy. Unremarkable thyroid gland. UPPER CHEST: No pneumothorax or pleural effusion. No nodules or masses. AORTIC ARCH: There is calcific atherosclerosis of the aortic arch. Conventional 3 vessel aortic branching pattern. RIGHT CAROTID SYSTEM: No dissection, occlusion or aneurysm. There is mixed density atherosclerosis extending into the proximal ICA, resulting in less than 50% stenosis. LEFT CAROTID SYSTEM: No dissection, occlusion or aneurysm. There is mixed density atherosclerosis extending into the proximal ICA, resulting in 60% stenosis. VERTEBRAL ARTERIES: Right dominant configuration. Diminutive left vertebral artery with probable short segment occlusion of the V3 segment. Normal right vertebral artery to the skull base. CTA HEAD FINDINGS POSTERIOR CIRCULATION: Right vertebral artery atherosclerosis with moderate stenosis. Normal left V4 segment. No proximal occlusion of the anterior or inferior cerebellar arteries. Basilar artery is normal. Superior cerebellar arteries are normal. Posterior cerebral arteries are normal. ANTERIOR CIRCULATION: The left ICA is occluded at its terminus within occluded length of approximately 9 mm. The right ICA is mildly atherosclerotic without stenosis. Anterior cerebral arteries are normal. Moderate multifocal stenosis of the M1 segment of the right MCA. The left MCA is patent, likely filled by collateral flow along the circle-of-Willis. There is multifocal mild atherosclerotic irregularity without  high-grade stenosis. VENOUS SINUSES: As permitted by contrast timing, patent. ANATOMIC VARIANTS: None Review of the MIP images confirms the above findings. IMPRESSION: 1. 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 The left MCA is patent, likely filled by collateral flow along the circle-of-Willis. 2. Moderate multifocal stenosis of the M1 segment of the right MCA. 3. Moderate right vertebral artery stenosis. 4. Bilateral carotid bifurcation atherosclerosis with 60% stenosis of the proximal left ICA. 5. Diminutive left vertebral artery with probable short segment occlusion of the V3 segment. Aortic Atherosclerosis (ICD10-I70.0). Critical Value/emergent results were called by telephone at the time of interpretation on 01/31/2023 at 1:17 am to provider [facility] , who verbally acknowledged these results. Electronically Signed: By: Deatra Robinson M.D. 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 There is reconstitution of the basilar tip with patency of both PCAs maintained. The superior cerebellar arteries are occluded at their origin. Electronically Signed   By: Deatra Robinson M.D.   On: 01/31/2023 01:30   Result Date: 01/31/2023 CLINICAL DATA:  Code stroke.  Fall with dizziness EXAM: CT HEAD WITHOUT CONTRAST TECHNIQUE: Contiguous axial images were obtained from the base of the skull through the vertex without intravenous contrast. RADIATION DOSE REDUCTION: This exam was performed according to the departmental dose-optimization program which includes automated exposure control, adjustment of the mA and/or kV according to  patient size and/or use of iterative reconstruction technique. COMPARISON:  None Available. FINDINGS: Brain: There is no mass, hemorrhage or extra-axial  collection. The size and configuration of the ventricles and extra-axial CSF spaces are normal. There are multiple old cerebellar small vessel infarcts. Bilateral PCA territory infarctions also appear chronic. There is mild white matter hypoattenuation. Vascular: Atherosclerosis of the carotid and vertebral arteries at the skull base. Skull: Normal Sinuses/Orbits: No fluid levels or advanced mucosal thickening of the visualized paranasal sinuses. No mastoid or middle ear effusion. The orbits are normal. ASPECTS [facility] Stroke Program Early CT Score) - Ganglionic level infarction (caudate, lentiform nuclei, internal capsule, insula, M1-M3 cortex): 7 - Supraganglionic infarction (M4-M6 cortex): 3 Total score (0-10 with 10 being normal): 10 IMPRESSION: 1. No acute intracranial abnormality. 2. ASPECTS is 10. 3. 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 4. Bilateral PCA territory infarctions also appear chronic. These results were called by telephone at the time of interpretation on 01/31/2023 at 1:00 am to provider [facility] , who verbally acknowledged these results. Electronically Signed: By: Deatra Robinson M.D. On: 01/31/2023 01:00   CT C-SPINE NO CHARGE  Result Date: 01/31/2023 CLINICAL DATA:  Neck pain, known left ICA occlusion EXAM: CT CERVICAL SPINE WITHOUT CONTRAST TECHNIQUE: Multidetector CT imaging of the cervical spine was performed without intravenous contrast. Multiplanar CT image reconstructions were also generated. RADIATION DOSE REDUCTION: This exam was performed according to the departmental dose-optimization program which includes automated exposure control, adjustment of the mA and/or kV according to patient size and/or use of iterative reconstruction technique. COMPARISON:  None Available. FINDINGS: Alignment: Within normal limits. Skull base and vertebrae: 7 cervical segments are well visualized. Vertebral body height is well maintained. Mild osteophytic change and facet hypertrophic  changes noted. No acute fracture or acute facet abnormality is noted. The odontoid is within normal limits. Soft tissues and spinal canal: Surrounding soft tissue structures appear within normal limits. Upper chest: Visualized lung apices are unremarkable with the exception of some scarring in the right apex. Other: None IMPRESSION: Multilevel degenerative change without acute bony abnormality. Electronically Signed   By: Alcide Clever M.D.   On: 01/31/2023 01:25     PHYSICAL EXAM  Temp:  [98 F (36.7 C)-99.5 F (37.5 C)] 99.5 F (37.5 C) (12/11 1525) Pulse Rate:  [77-105] 105 (12/11 1525) Resp:  [17-19] 18 (12/11 1525) BP: (122-141)/(69-84) 122/74 (12/11 1525) SpO2:  [95 %-99 %] 95 % (12/11 1525)  General - Well nourished, well developed, not in acute distress, mild restless.  Ophthalmologic - fundi not visualized due to noncooperation.  Cardiovascular - Regular rhythm and rate..  Neuro - awake, alert, eyes open, orientated to place, age and month but not to year. Paucity of speech but with severe dysarthria and intermittently intangible, but following most simple commands. Able to repeat in severe dysarthric voice. No gaze palsy, tracking bilaterally, visual field test showed right upper quadrantanopia, PERRL. No facial droop. Right facial dressing for wound from fall. Tongue midline. Bilateral UEs 4/5, no drift. Bilaterally LEs 3/5 proximal and 4/5 distally, no drift. Sensation symmetrical bilaterally, chronic left UE ataxia and left LE dysmetria, gait not tested.     ASSESSMENT/PLAN Grant Berry is a 77 y.o. Berry with history of hypertension, hyperlipidemia, stroke admitted for multiple falls. No tPA given due to outside window.    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  Subacute left PCA infarct CTA head and neck mid basilar  artery and ICA terminal occlusion.  Left ICA proximal 60% stenosis, right multifocal M1 moderate stenosis, right VA stenosis. MRI left MCA/PCA small to moderate infarct.  Old right PCA infarct. Due to developing expressive aphasia, repeat CT showed no acute change CTP no core infarct, increased TTP at posterior circulation and left MCA territory Status post IR with terminal ICA stenting and proximal ICA angioplasty with TICI3 reperfusion Post IR CT repeat left PCA progressive infarct. Hyperdense material in the left frontal sulci, which may represent subarachnoid hemorrhage versus contrast staining. MRI repeat Moderate acute infarct in the left occipital lobe with mild progression from yesterday. Interval punctate acute infarct in the left frontal white matter. 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 Bilateral P-comm and PCA patent Avoid low BP No intervention needed at this time, discussed with Dr. Corliss Skains  History of stroke 08/2019 admitted for dizziness, imbalance and leaning towards to the left.  CT showed left cerebellar infarct which confirmed on MRI.  MRI showed left SCA occlusion, left VA hypoplastic.  2D echo unremarkable.  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  Discharged on DAPT and Lipitor. Follow-up with Dr. Pearlean Brownie at [facility]  Delirium, improving Less restless, agitation now Sitter d/c'ed On seroquel 25 bid  Hx of hypertension Hypotension post procedure Stable now S/p albumin Avoid low BP Long term BP goal  normotensive  Hyperlipidemia Home meds: Zetia LDL 167, goal < 70 Statin intolerance (Lipitor and pravastatin) Continue Zetia at discharge Not candidate for Leqvio   Other Stroke Risk Factors Advanced age Former smoker  Other Active Problems AKI, creatinine 1.40--1.06--1.07--1.21, encourage po intake, IVF for 24h  Hospital day # 3    Marvel Plan, MD PhD Stroke Neurology 02/03/2023 8:11 PM    To contact Stroke Continuity provider, please refer to [email or web address]. After hours, contact General Neurology

## 2023-02-03 NOTE — Plan of Care (Signed)
   Problem: Education: Goal: Knowledge of disease or condition will improve Outcome: Progressing Goal: Knowledge of secondary prevention will improve (MUST DOCUMENT ALL) Outcome: Progressing Goal: Knowledge of patient specific risk factors will improve Loraine Leriche N/A or DELETE if not current risk factor) Outcome: Progressing   Problem: Ischemic Stroke/TIA Tissue Perfusion: Goal: Complications of ischemic stroke/TIA will be minimized Outcome: Progressing   Problem: Coping: Goal: Will verbalize positive feelings about self Outcome: Progressing Goal: Will identify appropriate support needs Outcome: Progressing   Problem: Health Behavior/Discharge Planning: Goal: Ability to manage health-related needs will improve Outcome: Progressing Goal: Goals will be collaboratively established with patient/family Outcome: Progressing   Problem: Self-Care: Goal: Ability to participate in self-care as condition permits will improve Outcome: Progressing Goal: Verbalization of feelings and concerns over difficulty with self-care will improve Outcome: Progressing Goal: Ability to communicate needs accurately will improve Outcome: Progressing   Problem: Nutrition: Goal: Risk of aspiration will decrease Outcome: Progressing Goal: Dietary intake will improve Outcome: Progressing   Problem: Education: Goal: Understanding of CV disease, CV risk reduction, and recovery process will improve Outcome: Progressing Goal: Individualized Educational Video(s) Outcome: Progressing   Problem: Activity: Goal: Ability to return to baseline activity level will improve Outcome: Progressing   Problem: Cardiovascular: Goal: Ability to achieve and maintain adequate cardiovascular perfusion will improve Outcome: Progressing Goal: Vascular access site(s) Level 0-1 will be maintained Outcome: Progressing   Problem: Health Behavior/Discharge Planning: Goal: Ability to safely manage health-related needs after  discharge will improve Outcome: Progressing   Problem: Education: Goal: Knowledge of General Education information will improve Description: Including pain rating scale, medication(s)/side effects and non-pharmacologic comfort measures Outcome: Progressing   Problem: Health Behavior/Discharge Planning: Goal: Ability to manage health-related needs will improve Outcome: Progressing   Problem: Clinical Measurements: Goal: Ability to maintain clinical measurements within normal limits will improve Outcome: Progressing Goal: Will remain free from infection Outcome: Progressing Goal: Diagnostic test results will improve Outcome: Progressing Goal: Respiratory complications will improve Outcome: Progressing Goal: Cardiovascular complication will be avoided Outcome: Progressing   Problem: Activity: Goal: Risk for activity intolerance will decrease Outcome: Progressing   Problem: Nutrition: Goal: Adequate nutrition will be maintained Outcome: Progressing   Problem: Coping: Goal: Level of anxiety will decrease Outcome: Progressing   Problem: Elimination: Goal: Will not experience complications related to bowel motility Outcome: Progressing Goal: Will not experience complications related to urinary retention Outcome: Progressing   Problem: Pain Management: Goal: General experience of comfort will improve Outcome: Progressing   Problem: Safety: Goal: Ability to remain free from injury will improve Outcome: Progressing   Problem: Skin Integrity: Goal: Risk for impaired skin integrity will decrease Outcome: Progressing

## 2023-02-03 NOTE — TOC Initial Note (Signed)
Transition of Care [facility]) - Initial/Assessment Note    Patient Details  Name: Grant Berry MRN: [id number] Date of Birth: Mar 17, 1945  Transition of [facility] CM/SW Contact:    Baldemar Lenis, LCSW Phone Number: 02/03/2023, 4:05 PM  Clinical Narrative:      CSW updated by Rehab Admissions that patient will need SNF placement. CSW completed referral and faxed out, no bed offers at this time. CSW to follow.             Expected Discharge Plan: Skilled Nursing Facility Barriers to Discharge: Continued Medical Work up, English as a second language teacher, Facility will not accept until restraint criteria met   Patient Goals and CMS Choice Patient states their goals for this hospitalization and ongoing recovery are:: patient unable to participate in goal setting, not oriented CMS Medicare.gov Compare Post Acute Care list provided to:: Patient Represenative (must comment) Choice offered to / list presented to : Adult Children [place] ownership interest in [facility].provided to:: Adult Children    Expected Discharge Plan and Services     Post Acute Care Choice: Skilled Nursing Facility Living arrangements for the past 2 months: Single Family Home                                      Prior Living Arrangements/Services Living arrangements for the past 2 months: Single Family Home Lives with:: Adult Children Patient language and need for interpreter reviewed:: No Do you feel safe going back to the place where you live?: Yes      Need for Family Participation in Patient Care: Yes (Comment) Care giver support system in place?: No (comment)   Criminal Activity/Legal Involvement Pertinent to Current Situation/Hospitalization: No - Comment as needed  Activities of Daily Living   ADL Screening (condition at time of admission) Independently performs ADLs?: No Does the patient have a NEW difficulty with bathing/dressing/toileting/self-feeding that is expected to last  >3 days?: Yes (Initiates electronic notice to provider for possible OT consult) Does the patient have a NEW difficulty with getting in/out of bed, walking, or climbing stairs that is expected to last >3 days?: Yes (Initiates electronic notice to provider for possible PT consult) Does the patient have a NEW difficulty with communication that is expected to last >3 days?: No Is the patient deaf or have difficulty hearing?: Yes Does the patient have difficulty seeing, even when wearing glasses/contacts?: Yes Does the patient have difficulty concentrating, remembering, or making decisions?: No  Permission Sought/Granted Permission sought to share information with : Facility Medical sales representative, Family Supports Permission granted to share information with : Yes, Verbal Permission Granted  Share Information with NAME: [name]  Permission granted to share info w AGENCY: SNF  Permission granted to share info w Relationship: Son     Emotional Assessment Appearance:: Appears stated age Attitude/Demeanor/Rapport: Unable to Assess Affect (typically observed): Unable to Assess Orientation: : Oriented to Self Alcohol / Substance Use: Not Applicable Psych Involvement: No (comment)  Admission diagnosis:  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 [place] [address] Phone: [phone] Fax: [phone]     Social Determinants of  Health (SDOH) Social History: SDOH Screenings   Food Insecurity: No Food Insecurity (01/31/2023)  Housing: Low Risk  (01/31/2023)  Transportation Needs: No Transportation Needs (01/31/2023)  Utilities: Not At Risk (01/31/2023)  Alcohol Screen: Low Risk  (08/03/2022)  Depression (PHQ2-9): Low Risk  (10/23/2022)  Financial Resource Strain: Low Risk  (08/03/2022)  Physical Activity: Insufficiently Active (08/03/2022)  Social Connections: Socially Isolated (08/03/2022)  Stress: No Stress Concern Present (08/03/2022)  Tobacco Use: Medium Risk (01/31/2023)   SDOH Interventions:     Readmission Risk Interventions     No data to display

## 2023-02-03 NOTE — Care Management Important Message (Signed)
Important Message  Patient Details  Name: Grant Berry MRN: [id number] Date of Birth: 1945-03-12   Important Message Given:  Yes - Medicare IM     Dorena Bodo 02/03/2023, 3:09 PM

## 2023-02-03 NOTE — Progress Notes (Signed)
PT Cancellation Note  Patient Details Name: Grant Berry MRN: [id number] DOB: 1945/02/24   Cancelled Treatment:    Reason Eval/Treat Not Completed: (P) Fatigue/lethargy limiting ability to participate, pt sleeping on x2 attempts throughout day. Unable too rouse to open eyes with pt snoring. Will check back as schedule allows to continue with PT POC.   Lenora Boys. PTA Acute Rehabilitation Services Office: [phone]    Catalina Antigua 02/03/2023, 3:55 PM

## 2023-02-03 NOTE — Plan of Care (Signed)
  I have entered order for patient's follow up with Dr. Corliss Skains.  He will be seen in about 8 weeks with CTA to evaluate stent patency and then see Dr. Corliss Skains in OP clinic.  Duffy Dantonio S Madeline Pho PA-C 02/03/2023 10:57 AM

## 2023-02-03 NOTE — NC FL2 (Signed)
[place] MEDICAID FL2 LEVEL OF CARE FORM     IDENTIFICATION  Patient Name: Grant Berry Birthdate: 1946-02-13 Sex: male Admission Date (Current Location): 01/31/2023  [facility] and [place] Number:  Producer, television/film/video and Address:  [facility] [place], 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   History of cerebrovascular accident (CVA) with residual deficit 04/23/2020   Seasonal allergies 04/23/2020   Primary hypertension 06/17/2012   Mixed hyperlipidemia 06/17/2012    Orientation RESPIRATION BLADDER Height & Weight     Self  Normal Indwelling catheter Weight: 119 lb 4.3 oz (54.1 kg) Height:  5\' 7"  (170.2 cm)  BEHAVIORAL SYMPTOMS/MOOD NEUROLOGICAL BOWEL NUTRITION STATUS      Incontinent Diet (heart healthy)  AMBULATORY STATUS COMMUNICATION OF NEEDS Skin   Extensive Assist Verbally Skin abrasions (left face abrasion, gauze dressing)                       Personal Care Assistance Level of Assistance  Bathing, Feeding, Dressing Bathing Assistance: Maximum assistance Feeding assistance: Maximum assistance Dressing Assistance: Maximum assistance     Functional Limitations Info  Sight, Hearing, Speech Sight Info: Impaired Hearing Info: Impaired Speech Info: Impaired    SPECIAL CARE FACTORS FREQUENCY  PT (By licensed PT), OT (By licensed OT)     PT Frequency: 5x/wk OT Frequency: 5x/wk            Contractures  Contractures Info: Not present    Additional Factors Info  Code Status, Allergies, Psychotropic Code Status Info: Full Allergies Info: Lisinopril, Atorvastatin, Pravastatin Psychotropic Info: Seroquel 25mg  2x/day         Current Medications (02/03/2023):  This is the current hospital active medication list Current Facility-Administered Medications  Medication Dose Route Frequency Provider Last Rate Last Admin   acetaminophen (TYLENOL) tablet 650 mg  650 mg Oral Q4H PRN Deveshwar, Simonne Maffucci, MD   650 mg at 02/02/23 1345   Or   acetaminophen (TYLENOL) 160 MG/5ML solution 650 mg  650 mg Per Tube Q4H PRN Deveshwar, Simonne Maffucci, MD       Or   acetaminophen (TYLENOL) suppository 650 mg  650 mg Rectal Q4H PRN Deveshwar, Simonne Maffucci, MD       aspirin EC tablet 81 mg  81 mg Oral Daily Erick Blinks, MD   81 mg at 02/03/23 0957   Chlorhexidine Gluconate Cloth 2 % PADS 6 each  6 each Topical Daily Erick Blinks, MD   6 each at 02/03/23 0958   clopidogrel (PLAVIX) tablet 75 mg  75 mg Oral Daily Erick Blinks, MD   75 mg at 02/03/23 0957   enoxaparin (LOVENOX) injection 40 mg  40 mg Subcutaneous Q24H Erick Blinks, MD   40 mg at 02/03/23 0957   ezetimibe (ZETIA) tablet 10 mg  10 mg Oral Daily Marvel Plan, MD   10 mg at 02/03/23 0957   Oral care mouth rinse  15 mL Mouth Rinse  PRN Erick Blinks, MD       QUEtiapine (SEROQUEL) tablet 25 mg  25 mg Oral BID Marvel Plan, MD   25 mg at 02/03/23 0957   senna-docusate (Senokot-S) tablet 1 tablet  1 tablet Oral QHS PRN Erick Blinks, MD         Discharge Medications: Please see discharge summary for a list of discharge medications.  Relevant Imaging Results:  Relevant Lab Results:   Additional Information SS#: [id number]  Baldemar Lenis, LCSW

## 2023-02-04 ENCOUNTER — Inpatient Hospital Stay (HOSPITAL_COMMUNITY): Payer: Medicare Other

## 2023-02-04 LAB — CBC
HCT: 30.9 % — ABNORMAL LOW (ref 39.0–52.0)
Hemoglobin: 10.3 g/dL — ABNORMAL LOW (ref 13.0–17.0)
MCH: 30 pg (ref 26.0–34.0)
MCHC: 33.3 g/dL (ref 30.0–36.0)
MCV: 90.1 fL (ref 80.0–100.0)
Platelets: 188 10*3/uL (ref 150–400)
RBC: 3.43 MIL/uL — ABNORMAL LOW (ref 4.22–5.81)
RDW: 14.5 % (ref 11.5–15.5)
WBC: 7.5 10*3/uL (ref 4.0–10.5)
nRBC: 0 % (ref 0.0–0.2)

## 2023-02-04 LAB — BASIC METABOLIC PANEL
Anion gap: 8 (ref 5–15)
BUN: 16 mg/dL (ref 8–23)
CO2: 27 mmol/L (ref 22–32)
Calcium: 8.7 mg/dL — ABNORMAL LOW (ref 8.9–10.3)
Chloride: 101 mmol/L (ref 98–111)
Creatinine, Ser: 1.21 mg/dL (ref 0.61–1.24)
GFR, Estimated: >60 mL/min (ref >60)
Glucose, Bld: 103 mg/dL — ABNORMAL HIGH (ref 70–99)
Potassium: 3.5 mmol/L (ref 3.5–5.1)
Sodium: 136 mmol/L (ref 135–145)

## 2023-02-04 MED ORDER — QUETIAPINE FUMARATE 25 MG PO TABS
25.0000 mg | ORAL_TABLET | Freq: Every day | ORAL | Status: DC
  Administered 2023-02-05 – 2023-02-11 (×7): 25 mg via ORAL
  Filled 2023-02-04 (×7): qty 1

## 2023-02-04 MED ORDER — SODIUM CHLORIDE 0.9 % IV SOLN: INTRAVENOUS | Status: AC

## 2023-02-04 MED ORDER — QUETIAPINE FUMARATE 50 MG PO TABS
50.0000 mg | ORAL_TABLET | Freq: Every day | ORAL | Status: DC
  Administered 2023-02-04 – 2023-02-10 (×7): 50 mg via ORAL
  Filled 2023-02-04 (×7): qty 1

## 2023-02-04 NOTE — Plan of Care (Signed)
   Problem: Education: Goal: Knowledge of disease or condition will improve Outcome: Progressing Goal: Knowledge of secondary prevention will improve (MUST DOCUMENT ALL) Outcome: Progressing Goal: Knowledge of patient specific risk factors will improve Loraine Leriche N/A or DELETE if not current risk factor) Outcome: Progressing   Problem: Ischemic Stroke/TIA Tissue Perfusion: Goal: Complications of ischemic stroke/TIA will be minimized Outcome: Progressing   Problem: Coping: Goal: Will verbalize positive feelings about self Outcome: Progressing Goal: Will identify appropriate support needs Outcome: Progressing   Problem: Health Behavior/Discharge Planning: Goal: Ability to manage health-related needs will improve Outcome: Progressing Goal: Goals will be collaboratively established with patient/family Outcome: Progressing   Problem: Self-Care: Goal: Ability to participate in self-care as condition permits will improve Outcome: Progressing Goal: Verbalization of feelings and concerns over difficulty with self-care will improve Outcome: Progressing Goal: Ability to communicate needs accurately will improve Outcome: Progressing   Problem: Nutrition: Goal: Risk of aspiration will decrease Outcome: Progressing Goal: Dietary intake will improve Outcome: Progressing   Problem: Education: Goal: Understanding of CV disease, CV risk reduction, and recovery process will improve Outcome: Progressing Goal: Individualized Educational Video(s) Outcome: Progressing   Problem: Activity: Goal: Ability to return to baseline activity level will improve Outcome: Progressing   Problem: Cardiovascular: Goal: Ability to achieve and maintain adequate cardiovascular perfusion will improve Outcome: Progressing Goal: Vascular access site(s) Level 0-1 will be maintained Outcome: Progressing   Problem: Health Behavior/Discharge Planning: Goal: Ability to safely manage health-related needs after  discharge will improve Outcome: Progressing   Problem: Education: Goal: Knowledge of General Education information will improve Description: Including pain rating scale, medication(s)/side effects and non-pharmacologic comfort measures Outcome: Progressing   Problem: Health Behavior/Discharge Planning: Goal: Ability to manage health-related needs will improve Outcome: Progressing   Problem: Clinical Measurements: Goal: Ability to maintain clinical measurements within normal limits will improve Outcome: Progressing Goal: Will remain free from infection Outcome: Progressing Goal: Diagnostic test results will improve Outcome: Progressing Goal: Respiratory complications will improve Outcome: Progressing Goal: Cardiovascular complication will be avoided Outcome: Progressing   Problem: Activity: Goal: Risk for activity intolerance will decrease Outcome: Progressing   Problem: Nutrition: Goal: Adequate nutrition will be maintained Outcome: Progressing   Problem: Coping: Goal: Level of anxiety will decrease Outcome: Progressing   Problem: Elimination: Goal: Will not experience complications related to bowel motility Outcome: Progressing Goal: Will not experience complications related to urinary retention Outcome: Progressing   Problem: Pain Management: Goal: General experience of comfort will improve Outcome: Progressing   Problem: Safety: Goal: Ability to remain free from injury will improve Outcome: Progressing   Problem: Skin Integrity: Goal: Risk for impaired skin integrity will decrease Outcome: Progressing

## 2023-02-04 NOTE — Plan of Care (Signed)
MRI brain completed, with new hemorrhages seen (images personally reviewed and discussed with Radiology): 1. Continued interval evolution of subacute left PCA distribution infarct, similar in size as compared to previous. Interval hemorrhagic transformation with a 2.7 x 1.5 cm hematoma at the left occipital lobe. Additional new acute intraparenchymal hemorrhage at the posterior left basal ganglia measuring 2.2 x 1.9 cm. No significant regional mass effect. No intraventricular extension. 2. Additional small volume subarachnoid hemorrhage at the anterior left frontal convexity, slightly less conspicuous as compared to prior. 3. Evolving late subacute right PCA distribution infarct, stable. 4. Underlying atrophy with chronic microvascular ischemic disease, with multiple chronic infarcts as above. 5. 9 mm T2 hyperintense lesion within the right parotid gland, indeterminate. Correlation with nonemergent outpatient ultrasound suggested for further evaluation.  A/R:  77 y.o. male with history of hypertension, hyperlipidemia, stroke who presented for multiple falls. MRI showed left MCA/PCA infarct, chronic right PCA infarct.  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  Admitted to ICU for close monitoring. Later on the day of admission during shift change, patient was found to have intermittent expressive aphasia, which was new.  Stat CT showed no bleeding.  CT perfusion showed no infarct core and increased TTP on posterior circulation and left MCA territory.  Discussed with Dr. Corliss Skains, code IR was activated. Patient underwent left terminal left ICA stenting and proximal left ICA angioplasty. - Repeat MRI this evening reveals 2 medium-sized hemorrhages in the left cerebral hemisphere, one due to hemorrhagic transformation, the other in the left basal ganglia where no stroke was seen on the most recent prior MRI from  12/9.  - Currently on aspirin 81 mg daily and clopidogrel 75 mg daily for intracranial stenting of distal left ICA.  - Unable to stop antiplatelets due to high risk of in-stent restenosis which likely would result in a massive left MCA and ACA territory ischemic infarction.  - Repeat CT head in 12 hours has been ordered.   Electronically signed: Dr. Caryl Pina

## 2023-02-04 NOTE — Progress Notes (Addendum)
Physical Therapy Treatment Patient Details Name: Grant Berry MRN: [id number] DOB: [birth year] Today's Date: 02/04/2023   History of Present Illness Pt is a 77 y.o. male who presented 01/31/23 s/p multiple falls.  MRI showed left MCA/PCA infarct, chronic right PCA infarct.  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 S/p bilat common carotid arteriograms R CFA approach, complete revascularization of L ICA supraclinoid segment, treated intracranial arteriosclerotic related stenosis, angioplasty of high grade L ICA proximal stenosis 12/8. PMH: L MCA CVA, HLD, HTN    PT Comments  Patient repeatedly saying he can't see and that his vision is blurry. Unable to see door opening despite light in hallway shining in. Could not identify colors or number of fingers held up. Required verbal cues and physical assist to maneuver through room (with RW) with mod assist. Gait is very guarded, short, shuffling steps. Knew he was in a hospital, but thought he was in [place]. Oriented to [facility] and [facility] with pt unable to recall ~5 minutes later (thought [facility]). Noted will have to be independent to return home as family cannot provide 24/7 assist. Will benefit from skilled therapy <3 hours/day.     If plan is discharge home, recommend the following: A lot of help with bathing/dressing/bathroom;Assistance with cooking/housework;Assistance with feeding;Direct supervision/assist for medications management;Direct supervision/assist for financial management;Assist for transportation;Help with stairs or ramp for entrance;Supervision due to cognitive status;A lot of help with walking and/or transfers   Can travel by private vehicle     No  Equipment Recommendations  Other (comment) (tbd)    Recommendations for Other Services       Precautions / Restrictions Precautions Precautions: Fall Precaution  Comments: h/o multiple falls; reports he can't see Restrictions Weight Bearing Restrictions Per Provider Order: No     Mobility  Bed Mobility Overal bed mobility: Needs Assistance Bed Mobility: Rolling, Sidelying to Sit Rolling: Min assist Sidelying to sit: Min assist       General bed mobility comments: Physical assist to initiate bringing legs off EOB to transition to sitting.    Transfers Overall transfer level: Needs assistance Equipment used: Rolling walker (2 wheels), 2 person hand held assist Transfers: Sit to/from Stand, Bed to chair/wheelchair/BSC Sit to Stand: Mod assist           General transfer comment: from EOB to RW mod    Ambulation/Gait Ambulation/Gait assistance: Mod assist, +2 safety/equipment Gait Distance (Feet): 50 Feet Assistive device: Rolling walker (2 wheels) Gait Pattern/deviations: Step-through pattern, Decreased stride length, Decreased weight shift to right, Decreased weight shift to left, Shuffle   Gait velocity interpretation: <1.8 ft/sec, indicate of risk for recurrent falls   General Gait Details: +2 for IV pole as pt needs assist to steer RW due to poor vision (could not even make out where the room door was with light shining in through door)   Stairs             Wheelchair Mobility     Tilt Bed    Modified Rankin (Stroke Patients Only) Modified Rankin (Stroke Patients Only) Pre-Morbid Rankin Score: No significant disability Modified Rankin: Moderately severe disability     Balance Overall balance assessment: Needs assistance, History of Falls Sitting-balance support: Bilateral upper extremity supported, Feet supported Sitting balance-Leahy Scale: Poor Sitting balance - Comments: sitting EOB, CGA provided for safety during balance   Standing balance support: Bilateral upper extremity supported Standing balance-Leahy  Scale: Poor Standing balance comment: Reliant on support from OT to maintain standing balance. Pt  demonstrated increased weight into heels while calves were using bed as support.                            Cognition Arousal: Alert Behavior During Therapy: Anxious Overall Cognitive Status: No family/caregiver present to determine baseline cognitive functioning Area of Impairment: Orientation, Attention, Memory, Following commands, Safety/judgement, Problem solving, Awareness                 Orientation Level: Disoriented to, Time, Situation, Place Current Attention Level: Focused Memory: Decreased recall of precautions, Decreased short-term memory Following Commands: Follows one step commands with increased time Safety/Judgement: Decreased awareness of safety, Decreased awareness of deficits   Problem Solving: Slow processing, Decreased initiation, Difficulty sequencing, Requires verbal cues, Requires tactile cues General Comments: Pt dysarthric and had increased difficulty with communication. States vision is blurry but denies seeing anything presented to him for identification        Exercises      General Comments        Pertinent Vitals/Pain Pain Assessment Pain Assessment: No/denies pain Faces Pain Scale: No hurt Breathing: normal    Home Living                          Prior Function            PT Goals (current goals can now be found in the care plan section) Acute Rehab PT Goals Patient Stated Goal: unable to state Time For Goal Achievement: 02/15/23 Potential to Achieve Goals: Good Progress towards PT goals: Progressing toward goals    Frequency    Min 1X/week      PT Plan      Co-evaluation              AM-PAC PT "6 Clicks" Mobility   Outcome Measure  Help needed turning from your back to your side while in a flat bed without using bedrails?: A Little Help needed moving from lying on your back to sitting on the side of a flat bed without using bedrails?: A Little Help needed moving to and from a bed to a  chair (including a wheelchair)?: Total Help needed standing up from a chair using your arms (e.g., wheelchair or bedside chair)?: A Lot Help needed to walk in hospital room?: Total Help needed climbing 3-5 steps with a railing? : Total 6 Click Score: 11    End of Session Equipment Utilized During Treatment: Gait belt Activity Tolerance: Patient limited by fatigue Patient left: with call bell/phone within reach;in chair;with chair alarm set;Other (comment) (telesitter) Nurse Communication: Mobility status;Other (comment) (poor vision; ?soft touch call light) PT Visit Diagnosis: Other abnormalities of gait and mobility (R26.89);Muscle weakness (generalized) (M62.81)     Time: 1610-9604 PT Time Calculation (min) (ACUTE ONLY): 21 min  Charges:    $Gait Training: 8-22 mins PT General Charges $$ ACUTE PT VISIT: 1 Visit                      Jerolyn Center, PT Acute Rehabilitation Services  Office [phone]    Zena Amos 02/04/2023, 11:31 AM

## 2023-02-04 NOTE — Plan of Care (Signed)
  Problem: Coping: Goal: Will verbalize positive feelings about self Outcome: Progressing   Problem: Nutrition: Goal: Risk of aspiration will decrease Outcome: Progressing   Problem: Activity: Goal: Ability to return to baseline activity level will improve Outcome: Progressing   Problem: Activity: Goal: Risk for activity intolerance will decrease Outcome: Progressing   Problem: Pain Management: Goal: General experience of comfort will improve Outcome: Progressing

## 2023-02-04 NOTE — Progress Notes (Addendum)
STROKE TEAM PROGRESS NOTE   SUBJECTIVE (INTERVAL HISTORY) No family is at the bedside.  Patient sitting in chair, taking nap. Easily arousable on voice, orientated to place, age but not to time. Moderate dysarthria, seems better than yesterday on speech.    OBJECTIVE Temp:  [98.2 F (36.8 C)-99.6 F (37.6 C)] 98.7 F (37.1 C) (12/12 1503) Pulse Rate:  [69-108] 91 (12/12 1503) Cardiac Rhythm: Normal sinus rhythm (12/12 0900) Resp:  [18-21] 18 (12/12 1503) BP: (113-133)/(59-76) 133/60 (12/12 1503) SpO2:  [95 %-99 %] 97 % (12/12 1503)  Recent Labs  Lab 01/31/23 0035  GLUCAP 93   Recent Labs  Lab 01/31/23 0040 01/31/23 0042 02/01/23 0444 02/02/23 0455 02/03/23 0604 02/04/23 0727  NA 139 139 137 136 136 136  K 3.5 3.6 3.7 3.4* 3.3* 3.5  CL 104 102 105 101 102 101  CO2 24  --  23 24 24 27   GLUCOSE 102* 102* 80 101* 104* 103*  BUN 27* 27* 9 14 12 16   CREATININE 1.33* 1.40* 1.06 1.07 1.21 1.21  CALCIUM 9.7  --  8.3* 9.4 9.2 8.7*   Recent Labs  Lab 01/31/23 0040  AST 27  ALT 15  ALKPHOS 55  BILITOT 0.5  PROT 7.2  ALBUMIN 4.0   Recent Labs  Lab 01/31/23 0040 01/31/23 0042 01/31/23 0454 02/01/23 0444 02/02/23 0455 02/03/23 0604 02/04/23 0727  WBC 6.7  --  6.4 6.3 8.4 8.8 7.5  NEUTROABS 4.5  --   --  4.5  --   --   --   HGB 12.5*   < > 12.9* 9.3* 11.0* 10.5* 10.3*  HCT 37.6*   < > 38.9* 29.1* 32.5* 30.9* 30.9*  MCV 90.6  --  89.6 92.7 88.3 88.3 90.1  PLT 192  --  196 153 165 168 188   < > = values in this interval not displayed.        Component Value Date/Time   CHOL 235 (H) 01/31/2023 0454   CHOL 202 (H) 07/10/2022 1012   CHOL 219 (H) 06/17/2012 1620   TRIG 47 01/31/2023 0454   TRIG 215 (H) 06/17/2012 1620   HDL 59 01/31/2023 0454   HDL 48 07/10/2022 1012   HDL 34 (L) 06/17/2012 1620   CHOLHDL 4.0 01/31/2023 0454   VLDL 9 01/31/2023 0454   LDLCALC 167 (H) 01/31/2023 0454   LDLCALC 134 (H) 07/10/2022 1012   LDLCALC 142 (H) 06/17/2012 1620   Lab  Results  Component Value Date   HGBA1C 5.1 01/31/2023      Component Value Date/Time   LABOPIA NONE DETECTED 01/31/2023 0039   COCAINSCRNUR NONE DETECTED 01/31/2023 0039   LABBENZ NONE DETECTED 01/31/2023 0039   AMPHETMU NONE DETECTED 01/31/2023 0039   THCU NONE DETECTED 01/31/2023 0039   LABBARB NONE DETECTED 01/31/2023 0039    Recent Labs  Lab 01/31/23 0040  ETH <10    I have personally reviewed the radiological images below and agree with the radiology interpretations.  IR PERCUTANEOUS ART THROMBECTOMY/INFUSION INTRACRANIAL INC DIAG ANGIO Result Date: 02/02/2023 INDICATION: History of repeated falls. Patient with new onset expressive aphasia and possible right sided weakness. 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 Also noted mid basilar artery occlusion. EXAM: 1. EMERGENT LARGE VESSEL OCCLUSION THROMBOLYSIS (anterior CIRCULATION) COMPARISON:  Recent CT angiogram of the head and neck, and MRI of the brain. MEDICATIONS: Ancef 2 g IV was administered within 1 hour of  the procedure. ANESTHESIA/SEDATION: General anesthesia. CONTRAST:  Omnipaque 300 approximately 110 mL. FLUOROSCOPY TIME:  Fluoroscopy Time: 31 minutes 42 seconds (1110 mGy). COMPLICATIONS: None immediate. TECHNIQUE: Following a full explanation of the procedure along with the potential associated complications, an informed witnessed consent was obtained. The risks of intracranial hemorrhage of 10%, worsening neurological deficit, ventilator dependency, death and inability to revascularize were all reviewed in detail with the patient. The patient was then put under general anesthesia by the Department of Anesthesiology at [facility]. The right groin was prepped and draped in the usual sterile fashion. Thereafter using modified Seldinger technique, transfemoral access into the right common femoral artery was obtained without difficulty. Over an  0.035 inch guidewire an 8 French 25 cm Pinnacle sheath was inserted. Through this, and also over an 0.035 inch guidewire a combination of a 125 cm 6 [place] Berenstein support catheter inside of an 087 95 cm balloon guide catheter was advanced to the aortic arch region and advanced to the distal left common carotid artery. The guidewire, and the support catheter were removed. Good aspiration was obtained from the hub of the balloon guide catheter. A control arteriogram was then performed centered extra cranially and intracranially. FINDINGS: The left common carotid arteriogram demonstrates the left external carotid artery and its major branches to be widely patent. The left internal carotid artery just distal to the bulb has a focal severe stenosis. 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 PROCEDURE: Through the balloon guide catheter in the distal left ICA, and over an 014 inch 300 cm exchange micro guidewire positioned in the horizontal petrous segment, control angioplasty of the proximal left internal carotid stenosis was performed with a 4 mm x 30 cm Viatrac 14 angioplasty balloon catheter which had been prepped with 50% contrast and 50% heparinized saline infusion. Using the rapid exchange technique, the balloon was positioned with its markers adequate distant from the site of severe stenosis. A control inflation was then performed using a micro inflation syringe device via micro tubing to approximately 4 mm where it was maintained for approximately a minute and a half. Thereafter, following deflation of the balloon and removal a control arteriogram performed through the balloon guide catheter demonstrated now improved caliber of the angioplastied segment. At this time a 071 Zoom aspiration catheter was advanced in combination with an 021 160 cm Trevo ProVue  microcatheter over the exchange micro guidewire to the proximal petrous segment. The 300 cm 014 exchange micro guidewire was then exchanged for an 018 inch micro guidewire with a moderate J configuration distally. The micro guidewire with the microcatheter was then gently advanced without difficulty through the occluded supraclinoid left ICA to the middle cerebral artery inferior division M2 segment followed by the microcatheter. The guidewire was removed. Good aspiration obtained from the hub of the microcatheter. A gentle control arteriogram performed through this demonstrated safe positioning of the tip of the microcatheter. A 4 mm x 40 mm Solitaire X retrieval device was then deployed with the proximal portion in the supraclinoid left ICA. At this time the Zoom aspiration catheter was advanced without difficulty to the mid M1 segment. Control aspiration was then performed for approximately a minute and a half at the hub of the Zoom aspiration catheter, and with a 20 mL syringe at the hub of the balloon guide catheter  for approximately a minute and a half. The combination was then retrieved and removed. Following reversal of flow arrest in the left internal carotid artery a control arteriogram performed demonstrated complete revascularization of the supraclinoid left ICA with opacification of the left middle cerebral artery achieving a TICI 3 revascularization. Also noted was opacification of the proximal left A1 segment with focal areas of severe stenosis due to arteriosclerosis. Bilateral PCOMs demonstrated opacification into the left posterior cerebral artery P1 segment. Underlying intracranial arteriosclerosis with resulting severe stenosis was noted just proximal to the origins of the posterior communicating arteries. Through the balloon guide catheter in the distal left ICA, an aspiration 5 French 115 cm guide catheter was then advanced to the proximal cavernous segment over an 035 inch guidewire. Through  this, 160 cm Trevo ProVue microcatheter was advanced over an 018 inch micro guidewire. The microcatheter and the micro guidewire were advanced to the distal M2 M3 regions of the inferior division followed by the microcatheter. The guidewire was removed. Good aspiration obtained from the hub of the microcatheter. This in turn was exchanged for an 014 inch 300 cm exchange micro guidewire with a moderate J configuration at its distal end. A control arteriogram performed demonstrated safe positioning of the tip of the micro guidewire. Measurements were then performed of the left ICA supraclinoid segment just proximal to the bifurcation and just distal to the origin of the ophthalmic artery. A 3 mm x 12 mm Onyx Frontier balloon mounted stent which had been prepped antegradely with 50% contrast and 50% heparinized saline infusion, and retrogradely with heparinized saline infusion, was advanced in a coaxial manner and with constant heparinized saline infusion and positioned without difficulty with the stent markers adequate distant from the area of severe stenosis. A control inflation was then performed using a micro inflation syringe device via micro tubing to just over 3 mm x 2 where it was maintained for approximately a minute and a half. Following balloon deflation and retrieval and removal, a control arteriogram performed through the 5 [place] intermediate catheter demonstrated significantly improved caliber of the supraclinoid left ICA with a TICI 3 revascularization of the left MCA being persistent. Again demonstrated was the stenosed left anterior cerebral A1 segment. Over the exchange micro guidewire now in the proximal cavernous segment, a control arteriogram performed through the balloon guide catheter following removal of the intermediate catheter. Measurements were then performed of the left internal carotid artery proximally at the site of the significant stenosis and the distal left common carotid artery. A  6/8 mm x 40 mm Xact stent delivery apparatus was then advanced in a coaxial manner and with constant heparinized saline infusion and positioned without difficulty with the adequate coverage distal to the angioplastied segment and into the distal left common carotid artery. The stent was then deployed in the usual manner without any difficulty. The delivery apparatus was removed. A control arteriogram performed through the balloon guide catheter demonstrated significantly improved caliber through the stented segment of the proximal left ICA. The waist at the site of the angioplasty was then treated with a 5 mm x 30 mm Viatrac angioplasty microcatheter which was advanced after its preparation as described above. Prior to the angioplasty, proximal flow arrest was initiated by inflating the balloon in the distal left common carotid artery, and with proximal aspiration during the angioplasty. Following the angioplasty, the balloon was retrieved and removed while aspiration was maintained as flow arrest was reversed. Control arteriograms were then performed through the balloon  guide catheter over the next 10-20 minutes continued to demonstrate excellent flow through the centered in the proximal left ICA. Intracranially no change was seen in the TICI 3 revascularization of the left middle cerebral artery distribution. A 5 French diagnostic catheter was then advanced into the right common carotid artery over an 035 inch guidewire. The right common carotid artery injection demonstrated patency of the right internal carotid artery at the neck, and intracranially. The petrous, the cavernous and the supraclinoid right ICA demonstrate wide patency. The right middle cerebral artery and the right anterior cerebral artery demonstrate wide patency into the capillary and venous phases with a 50% stenosis of the right middle cerebral artery M1 segment. Cross-filling via the anterior communicating artery of the left anterior cerebral  A2 segment, and the left M1 segment was evident. An 8 French Angio-Seal closure device was deployed at the right groin puncture site for hemostasis. Distal pulses remained present unchanged in both feet at the end of the procedure. Flat panel CT of the brain demonstrated no evidence of intracranial hemorrhage. Patient was extubated in was beginning to respond to simple commands appropriately slightly weaker on the right side. Medications utilized during the procedure. 81 mg of aspirin and Plavix 15 mg via orogastric tube prior to angioplasty. The patient was also given half bolus dose of cangrelor followed by a 4 hour half dose infusion to be followed by CT of the brain. Patient was then transferred to the neuro ICU for post revascularization care. 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 Underlying a significant atherosclerotic related stenosis treated with a 3 mm x 12 mm Onyx Frontier balloon mounted stent with significantly improved caliber and flow. Status post stent assisted angioplasty of proximal left ICA stenosis with proximal flow arrest as described without event. PLAN: Follow-up in the clinic 4 weeks post discharge. Electronically Signed   By: Julieanne Cotton M.D.   On: 02/02/2023 07:58   IR CT Head Ltd Result Date: 02/02/2023 INDICATION: History of repeated falls. Patient with new onset expressive aphasia and possible right sided weakness. 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 Also noted mid basilar artery occlusion. EXAM: 1. EMERGENT LARGE VESSEL OCCLUSION THROMBOLYSIS (anterior CIRCULATION) COMPARISON:  Recent CT angiogram of the head and neck, and MRI of the brain. MEDICATIONS: Ancef 2 g IV was  administered within 1 hour of the procedure. ANESTHESIA/SEDATION: General anesthesia. CONTRAST:  Omnipaque 300 approximately 110 mL. FLUOROSCOPY TIME:  Fluoroscopy Time: 31 minutes 42 seconds (1110 mGy). COMPLICATIONS: None immediate. TECHNIQUE: Following a full explanation of the procedure along with the potential associated complications, an informed witnessed consent was obtained. The risks of intracranial hemorrhage of 10%, worsening neurological deficit, ventilator dependency, death and inability to revascularize were all reviewed in detail with the patient. The patient was then put under general anesthesia by the Department of Anesthesiology at [facility]. The right groin was prepped and draped in the usual sterile fashion. Thereafter using modified Seldinger technique, transfemoral access into the right common femoral artery was obtained without difficulty. Over an 0.035 inch guidewire an 8 French 25 cm Pinnacle sheath was inserted. Through this, and also over an 0.035 inch guidewire a combination of a 125 cm 6 [place] Berenstein support catheter inside of  an 087 95 cm balloon guide catheter was advanced to the aortic arch region and advanced to the distal left common carotid artery. The guidewire, and the support catheter were removed. Good aspiration was obtained from the hub of the balloon guide catheter. A control arteriogram was then performed centered extra cranially and intracranially. FINDINGS: The left common carotid arteriogram demonstrates the left external carotid artery and its major branches to be widely patent. The left internal carotid artery just distal to the bulb has a focal severe stenosis. 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 PROCEDURE: Through the balloon guide catheter in the distal left ICA, and over an 014 inch 300 cm  exchange micro guidewire positioned in the horizontal petrous segment, control angioplasty of the proximal left internal carotid stenosis was performed with a 4 mm x 30 cm Viatrac 14 angioplasty balloon catheter which had been prepped with 50% contrast and 50% heparinized saline infusion. Using the rapid exchange technique, the balloon was positioned with its markers adequate distant from the site of severe stenosis. A control inflation was then performed using a micro inflation syringe device via micro tubing to approximately 4 mm where it was maintained for approximately a minute and a half. Thereafter, following deflation of the balloon and removal a control arteriogram performed through the balloon guide catheter demonstrated now improved caliber of the angioplastied segment. At this time a 071 Zoom aspiration catheter was advanced in combination with an 021 160 cm Trevo ProVue microcatheter over the exchange micro guidewire to the proximal petrous segment. The 300 cm 014 exchange micro guidewire was then exchanged for an 018 inch micro guidewire with a moderate J configuration distally. The micro guidewire with the microcatheter was then gently advanced without difficulty through the occluded supraclinoid left ICA to the middle cerebral artery inferior division M2 segment followed by the microcatheter. The guidewire was removed. Good aspiration obtained from the hub of the microcatheter. A gentle control arteriogram performed through this demonstrated safe positioning of the tip of the microcatheter. A 4 mm x 40 mm Solitaire X retrieval device was then deployed with the proximal portion in the supraclinoid left ICA. At this time the Zoom aspiration catheter was advanced without difficulty to the mid M1 segment. Control aspiration was then performed for approximately a minute and a half at the hub of the Zoom aspiration catheter, and with a 20 mL syringe at the hub of the balloon guide catheter for approximately  a minute and a half. The combination was then retrieved and removed. Following reversal of flow arrest in the left internal carotid artery a control arteriogram performed demonstrated complete revascularization of the supraclinoid left ICA with opacification of the left middle cerebral artery achieving a TICI 3 revascularization. Also noted was opacification of the proximal left A1 segment with focal areas of severe stenosis due to arteriosclerosis. Bilateral PCOMs demonstrated opacification into the left posterior cerebral artery P1 segment. Underlying intracranial arteriosclerosis with resulting severe stenosis was noted just proximal to the origins of the posterior communicating arteries. Through the balloon guide catheter in the distal left ICA, an aspiration 5 French 115 cm guide catheter was then advanced to the proximal cavernous segment over an 035 inch guidewire. Through this, 160 cm Trevo ProVue microcatheter was advanced over an 018 inch micro guidewire. The microcatheter and the micro guidewire were  advanced to the distal M2 M3 regions of the inferior division followed by the microcatheter. The guidewire was removed. Good aspiration obtained from the hub of the microcatheter. This in turn was exchanged for an 014 inch 300 cm exchange micro guidewire with a moderate J configuration at its distal end. A control arteriogram performed demonstrated safe positioning of the tip of the micro guidewire. Measurements were then performed of the left ICA supraclinoid segment just proximal to the bifurcation and just distal to the origin of the ophthalmic artery. A 3 mm x 12 mm Onyx Frontier balloon mounted stent which had been prepped antegradely with 50% contrast and 50% heparinized saline infusion, and retrogradely with heparinized saline infusion, was advanced in a coaxial manner and with constant heparinized saline infusion and positioned without difficulty with the stent markers adequate distant from the area  of severe stenosis. A control inflation was then performed using a micro inflation syringe device via micro tubing to just over 3 mm x 2 where it was maintained for approximately a minute and a half. Following balloon deflation and retrieval and removal, a control arteriogram performed through the 5 [place] intermediate catheter demonstrated significantly improved caliber of the supraclinoid left ICA with a TICI 3 revascularization of the left MCA being persistent. Again demonstrated was the stenosed left anterior cerebral A1 segment. Over the exchange micro guidewire now in the proximal cavernous segment, a control arteriogram performed through the balloon guide catheter following removal of the intermediate catheter. Measurements were then performed of the left internal carotid artery proximally at the site of the significant stenosis and the distal left common carotid artery. A 6/8 mm x 40 mm Xact stent delivery apparatus was then advanced in a coaxial manner and with constant heparinized saline infusion and positioned without difficulty with the adequate coverage distal to the angioplastied segment and into the distal left common carotid artery. The stent was then deployed in the usual manner without any difficulty. The delivery apparatus was removed. A control arteriogram performed through the balloon guide catheter demonstrated significantly improved caliber through the stented segment of the proximal left ICA. The waist at the site of the angioplasty was then treated with a 5 mm x 30 mm Viatrac angioplasty microcatheter which was advanced after its preparation as described above. Prior to the angioplasty, proximal flow arrest was initiated by inflating the balloon in the distal left common carotid artery, and with proximal aspiration during the angioplasty. Following the angioplasty, the balloon was retrieved and removed while aspiration was maintained as flow arrest was reversed. Control arteriograms were  then performed through the balloon guide catheter over the next 10-20 minutes continued to demonstrate excellent flow through the centered in the proximal left ICA. Intracranially no change was seen in the TICI 3 revascularization of the left middle cerebral artery distribution. A 5 French diagnostic catheter was then advanced into the right common carotid artery over an 035 inch guidewire. The right common carotid artery injection demonstrated patency of the right internal carotid artery at the neck, and intracranially. The petrous, the cavernous and the supraclinoid right ICA demonstrate wide patency. The right middle cerebral artery and the right anterior cerebral artery demonstrate wide patency into the capillary and venous phases with a 50% stenosis of the right middle cerebral artery M1 segment. Cross-filling via the anterior communicating artery of the left anterior cerebral A2 segment, and the left M1 segment was evident. An 8 French Angio-Seal closure device was deployed at the right groin puncture site  for hemostasis. Distal pulses remained present unchanged in both feet at the end of the procedure. Flat panel CT of the brain demonstrated no evidence of intracranial hemorrhage. Patient was extubated in was beginning to respond to simple commands appropriately slightly weaker on the right side. Medications utilized during the procedure. 81 mg of aspirin and Plavix 15 mg via orogastric tube prior to angioplasty. The patient was also given half bolus dose of cangrelor followed by a 4 hour half dose infusion to be followed by CT of the brain. Patient was then transferred to the neuro ICU for post revascularization care. 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 Underlying a significant atherosclerotic  related stenosis treated with a 3 mm x 12 mm Onyx Frontier balloon mounted stent with significantly improved caliber and flow. Status post stent assisted angioplasty of proximal left ICA stenosis with proximal flow arrest as described without event. PLAN: Follow-up in the clinic 4 weeks post discharge. Electronically Signed   By: Julieanne Cotton M.D.   On: 02/02/2023 07:58   IR INTRAVSC STENT CERV CAROTID W/O EMB-PROT MOD SED Result Date: 02/02/2023 INDICATION: History of repeated falls. Patient with new onset expressive aphasia and possible right sided weakness. 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 Also noted mid basilar artery occlusion. EXAM: 1. EMERGENT LARGE VESSEL OCCLUSION THROMBOLYSIS (anterior CIRCULATION) COMPARISON:  Recent CT angiogram of the head and neck, and MRI of the brain. MEDICATIONS: Ancef 2 g IV was administered within 1 hour of the procedure. ANESTHESIA/SEDATION: General anesthesia. CONTRAST:  Omnipaque 300 approximately 110 mL. FLUOROSCOPY TIME:  Fluoroscopy Time: 31 minutes 42 seconds (1110 mGy). COMPLICATIONS: None immediate. TECHNIQUE: Following a full explanation of the procedure along with the potential associated complications, an informed witnessed consent was obtained. The risks of intracranial hemorrhage of 10%, worsening neurological deficit, ventilator dependency, death and inability to revascularize were all reviewed in detail with the patient. The patient was then put under general anesthesia by the Department of Anesthesiology at [facility]. The right groin was prepped and draped in the usual sterile fashion. Thereafter using modified Seldinger technique, transfemoral access into the right common femoral artery was obtained without difficulty. Over an 0.035 inch guidewire an 8 French 25 cm Pinnacle sheath was inserted. Through this, and also over an 0.035 inch guidewire a  combination of a 125 cm 6 [place] Berenstein support catheter inside of an 087 95 cm balloon guide catheter was advanced to the aortic arch region and advanced to the distal left common carotid artery. The guidewire, and the support catheter were removed. Good aspiration was obtained from the hub of the balloon guide catheter. A control arteriogram was then performed centered extra cranially and intracranially. FINDINGS: The left common carotid arteriogram demonstrates the left external carotid artery and its major branches to be widely patent. The left internal carotid artery just distal to the bulb has a focal severe stenosis. 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 PROCEDURE: Through the balloon guide catheter in the distal left ICA, and over an 014 inch 300 cm exchange micro guidewire positioned in the  horizontal petrous segment, control angioplasty of the proximal left internal carotid stenosis was performed with a 4 mm x 30 cm Viatrac 14 angioplasty balloon catheter which had been prepped with 50% contrast and 50% heparinized saline infusion. Using the rapid exchange technique, the balloon was positioned with its markers adequate distant from the site of severe stenosis. A control inflation was then performed using a micro inflation syringe device via micro tubing to approximately 4 mm where it was maintained for approximately a minute and a half. Thereafter, following deflation of the balloon and removal a control arteriogram performed through the balloon guide catheter demonstrated now improved caliber of the angioplastied segment. At this time a 071 Zoom aspiration catheter was advanced in combination with an 021 160 cm Trevo ProVue microcatheter over the exchange micro guidewire to the proximal petrous segment. The 300 cm 014 exchange micro guidewire was  then exchanged for an 018 inch micro guidewire with a moderate J configuration distally. The micro guidewire with the microcatheter was then gently advanced without difficulty through the occluded supraclinoid left ICA to the middle cerebral artery inferior division M2 segment followed by the microcatheter. The guidewire was removed. Good aspiration obtained from the hub of the microcatheter. A gentle control arteriogram performed through this demonstrated safe positioning of the tip of the microcatheter. A 4 mm x 40 mm Solitaire X retrieval device was then deployed with the proximal portion in the supraclinoid left ICA. At this time the Zoom aspiration catheter was advanced without difficulty to the mid M1 segment. Control aspiration was then performed for approximately a minute and a half at the hub of the Zoom aspiration catheter, and with a 20 mL syringe at the hub of the balloon guide catheter for approximately a minute and a half. The combination was then retrieved and removed. Following reversal of flow arrest in the left internal carotid artery a control arteriogram performed demonstrated complete revascularization of the supraclinoid left ICA with opacification of the left middle cerebral artery achieving a TICI 3 revascularization. Also noted was opacification of the proximal left A1 segment with focal areas of severe stenosis due to arteriosclerosis. Bilateral PCOMs demonstrated opacification into the left posterior cerebral artery P1 segment. Underlying intracranial arteriosclerosis with resulting severe stenosis was noted just proximal to the origins of the posterior communicating arteries. Through the balloon guide catheter in the distal left ICA, an aspiration 5 French 115 cm guide catheter was then advanced to the proximal cavernous segment over an 035 inch guidewire. Through this, 160 cm Trevo ProVue microcatheter was advanced over an 018 inch micro guidewire. The microcatheter and the micro  guidewire were advanced to the distal M2 M3 regions of the inferior division followed by the microcatheter. The guidewire was removed. Good aspiration obtained from the hub of the microcatheter. This in turn was exchanged for an 014 inch 300 cm exchange micro guidewire with a moderate J configuration at its distal end. A control arteriogram performed demonstrated safe positioning of the tip of the micro guidewire. Measurements were then performed of the left ICA supraclinoid segment just proximal to the bifurcation and just distal to the origin of the ophthalmic artery. A 3 mm x 12 mm Onyx Frontier balloon mounted stent which had been prepped antegradely with 50% contrast and 50% heparinized saline infusion, and retrogradely with heparinized saline infusion, was advanced in a coaxial manner and with constant heparinized saline infusion and positioned without difficulty with the stent markers adequate distant from the area of severe stenosis.  A control inflation was then performed using a micro inflation syringe device via micro tubing to just over 3 mm x 2 where it was maintained for approximately a minute and a half. Following balloon deflation and retrieval and removal, a control arteriogram performed through the 5 [place] intermediate catheter demonstrated significantly improved caliber of the supraclinoid left ICA with a TICI 3 revascularization of the left MCA being persistent. Again demonstrated was the stenosed left anterior cerebral A1 segment. Over the exchange micro guidewire now in the proximal cavernous segment, a control arteriogram performed through the balloon guide catheter following removal of the intermediate catheter. Measurements were then performed of the left internal carotid artery proximally at the site of the significant stenosis and the distal left common carotid artery. A 6/8 mm x 40 mm Xact stent delivery apparatus was then advanced in a coaxial manner and with constant heparinized saline  infusion and positioned without difficulty with the adequate coverage distal to the angioplastied segment and into the distal left common carotid artery. The stent was then deployed in the usual manner without any difficulty. The delivery apparatus was removed. A control arteriogram performed through the balloon guide catheter demonstrated significantly improved caliber through the stented segment of the proximal left ICA. The waist at the site of the angioplasty was then treated with a 5 mm x 30 mm Viatrac angioplasty microcatheter which was advanced after its preparation as described above. Prior to the angioplasty, proximal flow arrest was initiated by inflating the balloon in the distal left common carotid artery, and with proximal aspiration during the angioplasty. Following the angioplasty, the balloon was retrieved and removed while aspiration was maintained as flow arrest was reversed. Control arteriograms were then performed through the balloon guide catheter over the next 10-20 minutes continued to demonstrate excellent flow through the centered in the proximal left ICA. Intracranially no change was seen in the TICI 3 revascularization of the left middle cerebral artery distribution. A 5 French diagnostic catheter was then advanced into the right common carotid artery over an 035 inch guidewire. The right common carotid artery injection demonstrated patency of the right internal carotid artery at the neck, and intracranially. The petrous, the cavernous and the supraclinoid right ICA demonstrate wide patency. The right middle cerebral artery and the right anterior cerebral artery demonstrate wide patency into the capillary and venous phases with a 50% stenosis of the right middle cerebral artery M1 segment. Cross-filling via the anterior communicating artery of the left anterior cerebral A2 segment, and the left M1 segment was evident. An 8 French Angio-Seal closure device was deployed at the right groin  puncture site for hemostasis. Distal pulses remained present unchanged in both feet at the end of the procedure. Flat panel CT of the brain demonstrated no evidence of intracranial hemorrhage. Patient was extubated in was beginning to respond to simple commands appropriately slightly weaker on the right side. Medications utilized during the procedure. 81 mg of aspirin and Plavix 15 mg via orogastric tube prior to angioplasty. The patient was also given half bolus dose of cangrelor followed by a 4 hour half dose infusion to be followed by CT of the brain. Patient was then transferred to the neuro ICU for post revascularization care. 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 Underlying a significant atherosclerotic related stenosis  treated with a 3 mm x 12 mm Onyx Frontier balloon mounted stent with significantly improved caliber and flow. Status post stent assisted angioplasty of proximal left ICA stenosis with proximal flow arrest as described without event. PLAN: Follow-up in the clinic 4 weeks post discharge. Electronically Signed   By: Julieanne Cotton M.D.   On: 02/02/2023 07:58   IR Intra Cran Stent Result Date: 02/02/2023 INDICATION: History of repeated falls. Patient with new onset expressive aphasia and possible right sided weakness. 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 Also noted mid basilar artery occlusion. EXAM: 1. EMERGENT LARGE VESSEL OCCLUSION THROMBOLYSIS (anterior CIRCULATION) COMPARISON:  Recent CT angiogram of the head and neck, and MRI of the brain. MEDICATIONS: Ancef 2 g IV was administered within 1 hour of the procedure. ANESTHESIA/SEDATION: General anesthesia. CONTRAST:  Omnipaque 300 approximately  110 mL. FLUOROSCOPY TIME:  Fluoroscopy Time: 31 minutes 42 seconds (1110 mGy). COMPLICATIONS: None immediate. TECHNIQUE: Following a full explanation of the procedure along with the potential associated complications, an informed witnessed consent was obtained. The risks of intracranial hemorrhage of 10%, worsening neurological deficit, ventilator dependency, death and inability to revascularize were all reviewed in detail with the patient. The patient was then put under general anesthesia by the Department of Anesthesiology at [facility]. The right groin was prepped and draped in the usual sterile fashion. Thereafter using modified Seldinger technique, transfemoral access into the right common femoral artery was obtained without difficulty. Over an 0.035 inch guidewire an 8 French 25 cm Pinnacle sheath was inserted. Through this, and also over an 0.035 inch guidewire a combination of a 125 cm 6 [place] Berenstein support catheter inside of an 087 95 cm balloon guide catheter was advanced to the aortic arch region and advanced to the distal left common carotid artery. The guidewire, and the support catheter were removed. Good aspiration was obtained from the hub of the balloon guide catheter. A control arteriogram was then performed centered extra cranially and intracranially. FINDINGS: The left common carotid arteriogram demonstrates the left external carotid artery and its major branches to be widely patent. The left internal carotid artery just distal to the bulb has a focal severe stenosis. 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 PROCEDURE: Through the balloon guide catheter in the distal left ICA, and over an 014 inch 300 cm exchange micro guidewire positioned in the horizontal petrous segment, control angioplasty of the proximal left internal carotid  stenosis was performed with a 4 mm x 30 cm Viatrac 14 angioplasty balloon catheter which had been prepped with 50% contrast and 50% heparinized saline infusion. Using the rapid exchange technique, the balloon was positioned with its markers adequate distant from the site of severe stenosis. A control inflation was then performed using a micro inflation syringe device via micro tubing to approximately 4 mm where it was maintained for approximately a minute and a half. Thereafter, following deflation of the balloon and removal a control arteriogram performed through the balloon guide catheter demonstrated now improved caliber of the angioplastied segment. At this time a 071 Zoom aspiration catheter was advanced in combination with an 021 160 cm Trevo ProVue microcatheter over the exchange micro guidewire to the proximal petrous segment. The 300 cm 014 exchange micro guidewire was then exchanged  for an 018 inch micro guidewire with a moderate J configuration distally. The micro guidewire with the microcatheter was then gently advanced without difficulty through the occluded supraclinoid left ICA to the middle cerebral artery inferior division M2 segment followed by the microcatheter. The guidewire was removed. Good aspiration obtained from the hub of the microcatheter. A gentle control arteriogram performed through this demonstrated safe positioning of the tip of the microcatheter. A 4 mm x 40 mm Solitaire X retrieval device was then deployed with the proximal portion in the supraclinoid left ICA. At this time the Zoom aspiration catheter was advanced without difficulty to the mid M1 segment. Control aspiration was then performed for approximately a minute and a half at the hub of the Zoom aspiration catheter, and with a 20 mL syringe at the hub of the balloon guide catheter for approximately a minute and a half. The combination was then retrieved and removed. Following reversal of flow arrest in the left internal  carotid artery a control arteriogram performed demonstrated complete revascularization of the supraclinoid left ICA with opacification of the left middle cerebral artery achieving a TICI 3 revascularization. Also noted was opacification of the proximal left A1 segment with focal areas of severe stenosis due to arteriosclerosis. Bilateral PCOMs demonstrated opacification into the left posterior cerebral artery P1 segment. Underlying intracranial arteriosclerosis with resulting severe stenosis was noted just proximal to the origins of the posterior communicating arteries. Through the balloon guide catheter in the distal left ICA, an aspiration 5 French 115 cm guide catheter was then advanced to the proximal cavernous segment over an 035 inch guidewire. Through this, 160 cm Trevo ProVue microcatheter was advanced over an 018 inch micro guidewire. The microcatheter and the micro guidewire were advanced to the distal M2 M3 regions of the inferior division followed by the microcatheter. The guidewire was removed. Good aspiration obtained from the hub of the microcatheter. This in turn was exchanged for an 014 inch 300 cm exchange micro guidewire with a moderate J configuration at its distal end. A control arteriogram performed demonstrated safe positioning of the tip of the micro guidewire. Measurements were then performed of the left ICA supraclinoid segment just proximal to the bifurcation and just distal to the origin of the ophthalmic artery. A 3 mm x 12 mm Onyx Frontier balloon mounted stent which had been prepped antegradely with 50% contrast and 50% heparinized saline infusion, and retrogradely with heparinized saline infusion, was advanced in a coaxial manner and with constant heparinized saline infusion and positioned without difficulty with the stent markers adequate distant from the area of severe stenosis. A control inflation was then performed using a micro inflation syringe device via micro tubing to just  over 3 mm x 2 where it was maintained for approximately a minute and a half. Following balloon deflation and retrieval and removal, a control arteriogram performed through the 5 [place] intermediate catheter demonstrated significantly improved caliber of the supraclinoid left ICA with a TICI 3 revascularization of the left MCA being persistent. Again demonstrated was the stenosed left anterior cerebral A1 segment. Over the exchange micro guidewire now in the proximal cavernous segment, a control arteriogram performed through the balloon guide catheter following removal of the intermediate catheter. Measurements were then performed of the left internal carotid artery proximally at the site of the significant stenosis and the distal left common carotid artery. A 6/8 mm x 40 mm Xact stent delivery apparatus was then advanced in a coaxial manner and with constant heparinized saline  infusion and positioned without difficulty with the adequate coverage distal to the angioplastied segment and into the distal left common carotid artery. The stent was then deployed in the usual manner without any difficulty. The delivery apparatus was removed. A control arteriogram performed through the balloon guide catheter demonstrated significantly improved caliber through the stented segment of the proximal left ICA. The waist at the site of the angioplasty was then treated with a 5 mm x 30 mm Viatrac angioplasty microcatheter which was advanced after its preparation as described above. Prior to the angioplasty, proximal flow arrest was initiated by inflating the balloon in the distal left common carotid artery, and with proximal aspiration during the angioplasty. Following the angioplasty, the balloon was retrieved and removed while aspiration was maintained as flow arrest was reversed. Control arteriograms were then performed through the balloon guide catheter over the next 10-20 minutes continued to demonstrate excellent flow through  the centered in the proximal left ICA. Intracranially no change was seen in the TICI 3 revascularization of the left middle cerebral artery distribution. A 5 French diagnostic catheter was then advanced into the right common carotid artery over an 035 inch guidewire. The right common carotid artery injection demonstrated patency of the right internal carotid artery at the neck, and intracranially. The petrous, the cavernous and the supraclinoid right ICA demonstrate wide patency. The right middle cerebral artery and the right anterior cerebral artery demonstrate wide patency into the capillary and venous phases with a 50% stenosis of the right middle cerebral artery M1 segment. Cross-filling via the anterior communicating artery of the left anterior cerebral A2 segment, and the left M1 segment was evident. An 8 French Angio-Seal closure device was deployed at the right groin puncture site for hemostasis. Distal pulses remained present unchanged in both feet at the end of the procedure. Flat panel CT of the brain demonstrated no evidence of intracranial hemorrhage. Patient was extubated in was beginning to respond to simple commands appropriately slightly weaker on the right side. Medications utilized during the procedure. 81 mg of aspirin and Plavix 15 mg via orogastric tube prior to angioplasty. The patient was also given half bolus dose of cangrelor followed by a 4 hour half dose infusion to be followed by CT of the brain. Patient was then transferred to the neuro ICU for post revascularization care. 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 Underlying a significant atherosclerotic related stenosis treated with a 3 mm x 12 mm Onyx Frontier balloon mounted stent with significantly improved caliber and flow.  Status post stent assisted angioplasty of proximal left ICA stenosis with proximal flow arrest as described without event. PLAN: Follow-up in the clinic 4 weeks post discharge. Electronically Signed   By: Julieanne Cotton M.D.   On: 02/02/2023 07:58   ECHOCARDIOGRAM COMPLETE Result Date: 02/01/2023    ECHOCARDIOGRAM REPORT   Patient Name:   Grant Berry Date of Exam: 02/01/2023 Medical Rec #:  [id number]          Height:       67.0 in Accession #:    [id number]         Weight:       119.3 lb Date of Birth:  13-Jan-1946          BSA:  1.623 m Patient Age:    77 years           BP:           172/87 mmHg Patient Gender: M                  HR:           92 bpm. Exam Location:  Inpatient Procedure: 2D Echo, Cardiac Doppler and Color Doppler Indications:    Stroke  History:        Patient has prior history of Echocardiogram examinations, most                 recent 09/02/2019. Stroke; Risk Factors:Hypertension and                 Dyslipidemia.  Sonographer:    Milda Smart Referring Phys: [id number] [facility]  Sonographer Comments: Suboptimal parasternal window. Image acquisition challenging due to patient body habitus and Image acquisition challenging due to respiratory motion. IMPRESSIONS  1. Left ventricular ejection fraction, by estimation, is 60 to 65%. The left ventricle has normal function. The left ventricle has no regional wall motion abnormalities. Left ventricular diastolic parameters are consistent with Grade I diastolic dysfunction (impaired relaxation).  2. Right ventricular systolic function is normal. The right ventricular size is normal.  3. The mitral valve is abnormal. No evidence of mitral valve regurgitation. No evidence of mitral stenosis.  4. The aortic valve was not well visualized. There is mild calcification of the aortic valve. There is mild thickening of the aortic valve. Aortic valve regurgitation is not visualized. Aortic valve sclerosis is present, with no evidence of  aortic valve  stenosis.  5. The inferior vena cava is normal in size with greater than 50% respiratory variability, suggesting right atrial pressure of 3 mmHg. FINDINGS  Left Ventricle: Left ventricular ejection fraction, by estimation, is 60 to 65%. The left ventricle has normal function. The left ventricle has no regional wall motion abnormalities. The left ventricular internal cavity size was normal in size. There is  no left ventricular hypertrophy. Left ventricular diastolic parameters are consistent with Grade I diastolic dysfunction (impaired relaxation). Right Ventricle: The right ventricular size is normal. No increase in right ventricular wall thickness. Right ventricular systolic function is normal. Left Atrium: Left atrial size was normal in size. Right Atrium: Right atrial size was normal in size. Pericardium: There is no evidence of pericardial effusion. Mitral Valve: The mitral valve is abnormal. There is mild thickening of the mitral valve leaflet(s). There is mild calcification of the mitral valve leaflet(s). Mild mitral annular calcification. No evidence of mitral valve regurgitation. No evidence of mitral valve stenosis. MV peak gradient, 5.8 mmHg. The mean mitral valve gradient is 3.0 mmHg. Tricuspid Valve: The tricuspid valve is normal in structure. Tricuspid valve regurgitation is not demonstrated. No evidence of tricuspid stenosis. Aortic Valve: The aortic valve was not well visualized. There is mild calcification of the aortic valve. There is mild thickening of the aortic valve. Aortic valve regurgitation is not visualized. Aortic valve sclerosis is present, with no evidence of aortic valve stenosis. Pulmonic Valve: The pulmonic valve was normal in structure. Pulmonic valve regurgitation is not visualized. No evidence of pulmonic stenosis. Aorta: The aortic root is normal in size and structure. Venous: The inferior vena cava is normal in size with greater than 50% respiratory variability,  suggesting right atrial pressure of 3 mmHg. IAS/Shunts: No atrial level shunt detected by color flow Doppler.  LEFT VENTRICLE PLAX 2D LVIDd:         4.40 cm   Diastology LVIDs:         3.20 cm   LV e' medial:    6.20 cm/s LV PW:         0.80 cm   LV E/e' medial:  13.4 LV IVS:        0.60 cm   LV e' lateral:   7.94 cm/s LVOT diam:     2.50 cm   LV E/e' lateral: 10.5 LVOT Area:     4.91 cm  RIGHT VENTRICLE             IVC RV S prime:     17.80 cm/s  IVC diam: 1.40 cm TAPSE (M-mode): 2.5 cm LEFT ATRIUM             Index        RIGHT ATRIUM           Index LA diam:        3.30 cm 2.03 cm/m   RA Area:     11.70 cm LA Vol (A2C):   19.1 ml 11.77 ml/m  RA Volume:   26.80 ml  16.51 ml/m LA Vol (A4C):   27.2 ml 16.76 ml/m LA Biplane Vol: 23.8 ml 14.66 ml/m   AORTA Ao Root diam: 3.60 cm Ao Asc diam:  3.40 cm MITRAL VALVE MV Area (PHT): 1.73 cm     SHUNTS MV Peak grad:  5.8 mmHg     Systemic Diam: 2.50 cm MV Mean grad:  3.0 mmHg MV Vmax:       1.20 m/s MV Vmean:      83.8 cm/s MV Decel Time: 438 msec MV E velocity: 83.30 cm/s MV A velocity: 108.00 cm/s MV E/A ratio:  0.77 Charlton Haws MD Electronically signed by Charlton Haws MD Signature Date/Time: 02/01/2023/3:01:07 PM    Final    MR BRAIN WO CONTRAST Result Date: 02/01/2023 CLINICAL DATA:  Stroke and embolectomy. EXAM: MRI HEAD WITHOUT CONTRAST TECHNIQUE: Multiplanar, multiecho pulse sequences of the brain and surrounding structures were obtained without intravenous contrast. COMPARISON:  Head CT and MRI from yesterday FINDINGS: Brain: Moderate acute infarct in the left occipital cortex, increased from yesterday. Punctate acute infarct in the superior left frontal white matter since prior. There are areas of low-grade restricted diffusion in the left periatrial white matter and right occipital pole attributed to subacute ischemia. Subarachnoid hemorrhage along the anterior left frontal convexity, distinct from any areas of acute infarction and known from prior head  CT. Pre-existing bilateral cerebellar and brainstem small infarcts. Chronic lacunar infarcts in theright caudate. Ischemic gliosis is mild. 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 Generalized brain atrophy. Vascular: No flow seen in the left vertebral artery. There is a basilar flow void. Skull and upper cervical spine: No focal marrow lesion Sinuses/Orbits: No acute finding IMPRESSION: 1. Moderate acute infarct in the left occipital lobe with mild progression from yesterday. Interval punctate acute infarct in the left frontal white matter. 2. Areas of chronic and subacute ischemia preferentially affecting the posterior circulation. 3. Known subarachnoid hemorrhage at the anterior left frontal convexity, stable from preceding head CT. Electronically Signed   By: Tiburcio Pea M.D.   On: 02/01/2023 04:58   CT HEAD WO CONTRAST ( ) Result Date: 01/31/2023 CLINICAL DATA:  Stroke, follow-up; post canrelor infusion EXAM: CT HEAD WITHOUT CONTRAST TECHNIQUE: Contiguous axial images were obtained from the base  of the skull through the vertex without intravenous contrast. RADIATION DOSE REDUCTION: This exam was performed according to the departmental dose-optimization program which includes automated exposure control, adjustment of the mA and/or kV according to patient size and/or use of iterative reconstruction technique. COMPARISON:  CT head 10/01/2022 and intra procedural CT 10/01/2022 FINDINGS: Brain: Hyperdense material in the left frontal sulci (series 5, image 47 and 60 and series 3, images 15-18 and 25 area and series 3, image 25), which may represent subarachnoid hemorrhage versus contrast staining. This was not apparent on the immediate postprocedural CT head. Redemonstrated cytotoxic edema in the in the left occipital lobe (series 3, image 14), which is slightly more prominent than on the prior exam. More heterogeneous hypodensity in the right occipital lobe, which  is favored to be chronic remote infarcts in the pons and bilateral cerebellar hemispheres. No evidence of acute infarction, hemorrhage, mass, mass effect, or midline shift. No hydrocephalus or extra-axial fluid collection. Vascular: No hyperdense vessel. Atherosclerotic calcifications in the intracranial carotid and vertebral arteries. Skull: Negative for fracture or focal lesion. Sinuses/Orbits: Mucosal thickening in the ethmoid air cells. No acute finding in the orbits. Status post bilateral lens replacements. Other: The mastoid air cells are well aerated. IMPRESSION: 1. Hyperdense material in the left frontal sulci, which may represent subarachnoid hemorrhage versus contrast staining. This was not apparent on the immediate postprocedural CT head. Recommend short-term follow-up head CT. 2. Redemonstrated cytotoxic edema in the left occipital lobe, which is slightly more prominent than on the prior exam. These results will be called to the ordering clinician or representative by the Radiologist Assistant, and communication documented in the PACS or [employer]. Electronically Signed   By: Wiliam Ke M.D.   On: 01/31/2023 20:52   CT CEREBRAL PERFUSION W CONTRAST Result Date: 01/31/2023 CLINICAL DATA:  Code stroke. 77 year old male. CTA yesterday demonstrating left ICA terminus, distal left vertebral artery, Basilar artery occlusions. EXAM: CT PERFUSION BRAIN TECHNIQUE: Multiphase CT imaging of the brain was performed following IV bolus contrast injection. Subsequent parametric perfusion maps were calculated using RAPID software. RADIATION DOSE REDUCTION: This exam was performed according to the departmental dose-optimization program which includes automated exposure control, adjustment of the mA and/or kV according to patient size and/or use of iterative reconstruction technique. CONTRAST:  40mL OMNIPAQUE IOHEXOL 350 MG/ML SOLN COMPARISON:  Head CTs, CTA head and neck, and brain MRI earlier today.  FINDINGS: CT Brain Perfusion Findings: CBF (<30%) Volume: 0mL. No CBF or CBV parameter abnormality is detected. Perfusion (Tmax>6.0s) volume: . Fairly symmetric abnormal T-max throughout the bilateral cerebellum, posterior cerebral hemispheres. T-max > 4 seconds is more pronounced in the left cerebral hemisphere including the left MCA territory. There is volume of minimal T-max > 10 seconds in the right posterior circulation. Mismatch Volume: Infarct Core: No infarct core is detected Infarction Location:Oligemia throughout the posterior circulation, to a lesser extent the left MCA territory. IMPRESSION: Oligemia throughout the bilateral posterior circulation, including the posterior fossa. Less pronounced oligemia in the Left MCA territory. No infarct core, CBF or CBV parameter abnormality detected by CTP. These results were communicated to Dr. Roda Shutters at 8:12 am on 01/31/2023 by text page via the [facility] messaging system. Electronically Signed   By: Odessa Fleming M.D.   On: 01/31/2023 08:12   CT HEAD CODE STROKE WO CONTRAST Result Date: 01/31/2023 CLINICAL DATA:  Code stroke. 77 year old male. CTA yesterday demonstrating left ICA terminus, distal left vertebral artery, Basilar artery occlusions. EXAM: CT HEAD WITHOUT  CONTRAST TECHNIQUE: Contiguous axial images were obtained from the base of the skull through the vertex without intravenous contrast. RADIATION DOSE REDUCTION: This exam was performed according to the departmental dose-optimization program which includes automated exposure control, adjustment of the mA and/or kV according to patient size and/or use of iterative reconstruction technique. COMPARISON:  Brain MRI 0244 hours today. CTA head and neck 0056 hours today. Plain head CT 0050 hours today. FINDINGS: Brain: Left occipital pole cytotoxic edema is slightly more conspicuous on CT now. Heterogeneous right occipital pole redemonstrated. Chronic left central pontine and bilateral cerebellar infarcts on  MRI appears stable by CT. No acute intracranial hemorrhage identified. No new cytotoxic edema compared to the earlier MRI. No midline shift, mass effect, or evidence of intracranial mass lesion. No ventriculomegaly. Vascular: Extensive Calcified atherosclerosis at the skull base. See also CTA earlier today. Skull: Stable and intact. Sinuses/Orbits: Visualized paranasal sinuses and mastoids are clear. Other: No acute orbit or scalp soft tissue finding. ASPECTS [facility] Stroke Program Early CT Score) Total score (0-10 with 10 being normal): 10; occipital pole cytotoxic edema. IMPRESSION: 1. Stable non contrast CT appearance of the brain since 0050 hours today: Occipital pole Cytotoxic edema stable from the MRI at 0242 hours today. No new edema, no intracranial hemorrhage, or mass effect. 2. 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 Electronically Signed   By: Odessa Fleming M.D.   On: 01/31/2023 08:08   MR BRAIN WO CONTRAST Result Date: 01/31/2023 CLINICAL DATA:  Dizziness and gait instability. Large vessel occlusion. EXAM: MRI HEAD WITHOUT CONTRAST TECHNIQUE: Multiplanar, multiecho pulse sequences of the brain and surrounding structures were obtained without intravenous contrast. COMPARISON:  CTA head neck 01/31/2023 Brain MRI 09/01/2019 FINDINGS: Brain: There are areas of acute/early subacute ischemia within both occipital lobes, left-greater-than-right. These areas correspond to the areas of infarction seen on the earlier noncontrast head CT (suggested to be chronic at that time). There is no acute ischemia of the brainstem, cerebellum or anterior circulation. There is multifocal hyperintense T2-weighted signal within the white matter. Generalized volume loss. There is hyperintense T2-weighted signal that corresponds to both sites of infarction. There is a component of chronic infarct within the superior right occipital lobe (series 11, image 25). There are multiple old cerebellar infarcts. The midline  structures are normal. Vascular: Abnormal distal basilar artery flow void in keeping with known occlusion. Abnormal flow void at the skull base left ICA covers a longer segment than the demonstrated occlusion on the earlier CTA, likely due to inhibited upstream flow. Skull and upper cervical spine: Normal calvarium and skull base. Visualized upper cervical spine and soft tissues are normal. Sinuses/Orbits:No paranasal sinus fluid levels or advanced mucosal thickening. No mastoid or middle ear effusion. Normal orbits. IMPRESSION: 1. Acute/early subacute infarcts within both occipital lobes, left-greater-than-right. 2. No brainstem, cerebellar or anterior circulation acute ischemia. 3. Abnormal skull base flow voids in keeping with known vascular and left ICA occlusions. 4. Old superior right occipital and bilateral cerebellar infarcts. Electronically Signed   By: Deatra Robinson M.D.   On: 01/31/2023 03:24   CT ANGIO HEAD NECK W WO CM (CODE STROKE) Addendum Date: 01/31/2023 ADDENDUM REPORT: 01/31/2023 02:45 ADDENDUM: In addition to the above described ICA occlusion, the distal basilar artery is occluded. There is reconstitution of the basilar tip with patency of both PCAs maintained. The superior cerebellar arteries are occluded at their origin. Please note that this addendum was previously mistakenly associated with the noncontrast head CT for this patient  performed on the same day. Electronically Signed   By: Deatra Robinson M.D.   On: 01/31/2023 02:45   Result Date: 01/31/2023 CLINICAL DATA:  Fall with dysarthria and dizziness EXAM: CT ANGIOGRAPHY HEAD AND NECK WITH AND WITHOUT CONTRAST TECHNIQUE: Multidetector CT imaging of the head and neck was performed using the standard protocol during bolus administration of intravenous contrast. Multiplanar CT image reconstructions and MIPs were obtained to evaluate the vascular anatomy. Carotid stenosis measurements (when applicable) are obtained utilizing NASCET  criteria, using the distal internal carotid diameter as the denominator. RADIATION DOSE REDUCTION: This exam was performed according to the departmental dose-optimization program which includes automated exposure control, adjustment of the mA and/or kV according to patient size and/or use of iterative reconstruction technique. CONTRAST:  75mL OMNIPAQUE IOHEXOL 350 MG/ML SOLN COMPARISON:  None Available. FINDINGS: CTA NECK FINDINGS SKELETON: No acute abnormality or high grade bony spinal canal stenosis. OTHER NECK: Normal pharynx, larynx and major salivary glands. No cervical lymphadenopathy. Unremarkable thyroid gland. UPPER CHEST: No pneumothorax or pleural effusion. No nodules or masses. AORTIC ARCH: There is calcific atherosclerosis of the aortic arch. Conventional 3 vessel aortic branching pattern. RIGHT CAROTID SYSTEM: No dissection, occlusion or aneurysm. There is mixed density atherosclerosis extending into the proximal ICA, resulting in less than 50% stenosis. LEFT CAROTID SYSTEM: No dissection, occlusion or aneurysm. There is mixed density atherosclerosis extending into the proximal ICA, resulting in 60% stenosis. VERTEBRAL ARTERIES: Right dominant configuration. Diminutive left vertebral artery with probable short segment occlusion of the V3 segment. Normal right vertebral artery to the skull base. CTA HEAD FINDINGS POSTERIOR CIRCULATION: Right vertebral artery atherosclerosis with moderate stenosis. Normal left V4 segment. No proximal occlusion of the anterior or inferior cerebellar arteries. Basilar artery is normal. Superior cerebellar arteries are normal. Posterior cerebral arteries are normal. ANTERIOR CIRCULATION: The left ICA is occluded at its terminus within occluded length of approximately 9 mm. The right ICA is mildly atherosclerotic without stenosis. Anterior cerebral arteries are normal. Moderate multifocal stenosis of the M1 segment of the right MCA. The left MCA is patent, likely filled by  collateral flow along the circle-of-Willis. There is multifocal mild atherosclerotic irregularity without high-grade stenosis. VENOUS SINUSES: As permitted by contrast timing, patent. ANATOMIC VARIANTS: None Review of the MIP images confirms the above findings. IMPRESSION: 1. 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 The left MCA is patent, likely filled by collateral flow along the circle-of-Willis. 2. Moderate multifocal stenosis of the M1 segment of the right MCA. 3. Moderate right vertebral artery stenosis. 4. Bilateral carotid bifurcation atherosclerosis with 60% stenosis of the proximal left ICA. 5. Diminutive left vertebral artery with probable short segment occlusion of the V3 segment. Aortic Atherosclerosis (ICD10-I70.0). Critical Value/emergent results were called by telephone at the time of interpretation on 01/31/2023 at 1:17 am to provider [facility] , who verbally acknowledged these results. Electronically Signed: By: Deatra Robinson M.D. 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 There is reconstitution of the basilar tip with patency of both PCAs maintained. The superior cerebellar arteries are occluded at their origin. Electronically Signed   By: Deatra Robinson M.D.   On: 01/31/2023 01:30   Result Date: 01/31/2023 CLINICAL DATA:  Code stroke.  Fall with dizziness EXAM: CT HEAD WITHOUT CONTRAST TECHNIQUE: Contiguous  axial images were obtained from the base of the skull through the vertex without intravenous contrast. RADIATION DOSE REDUCTION: This exam was performed according to the departmental dose-optimization program which includes automated exposure control, adjustment of the mA and/or kV according to patient size and/or use of iterative reconstruction  technique. COMPARISON:  None Available. FINDINGS: Brain: There is no mass, hemorrhage or extra-axial collection. The size and configuration of the ventricles and extra-axial CSF spaces are normal. There are multiple old cerebellar small vessel infarcts. Bilateral PCA territory infarctions also appear chronic. There is mild white matter hypoattenuation. Vascular: Atherosclerosis of the carotid and vertebral arteries at the skull base. Skull: Normal Sinuses/Orbits: No fluid levels or advanced mucosal thickening of the visualized paranasal sinuses. No mastoid or middle ear effusion. The orbits are normal. ASPECTS [facility] Stroke Program Early CT Score) - Ganglionic level infarction (caudate, lentiform nuclei, internal capsule, insula, M1-M3 cortex): 7 - Supraganglionic infarction (M4-M6 cortex): 3 Total score (0-10 with 10 being normal): 10 IMPRESSION: 1. No acute intracranial abnormality. 2. ASPECTS is 10. 3. 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 4. Bilateral PCA territory infarctions also appear chronic. These results were called by telephone at the time of interpretation on 01/31/2023 at 1:00 am to provider [facility] , who verbally acknowledged these results. Electronically Signed: By: Deatra Robinson M.D. On: 01/31/2023 01:00   CT C-SPINE NO CHARGE Result Date: 01/31/2023 CLINICAL DATA:  Neck pain, known left ICA occlusion EXAM: CT CERVICAL SPINE WITHOUT CONTRAST TECHNIQUE: Multidetector CT imaging of the cervical spine was performed without intravenous contrast. Multiplanar CT image reconstructions were also generated. RADIATION DOSE REDUCTION: This exam was performed according to the departmental dose-optimization program which includes automated exposure control, adjustment of the mA and/or kV according to patient size and/or use of iterative reconstruction technique. COMPARISON:  None Available. FINDINGS: Alignment: Within normal limits. Skull base and vertebrae: 7 cervical segments are well  visualized. Vertebral body height is well maintained. Mild osteophytic change and facet hypertrophic changes noted. No acute fracture or acute facet abnormality is noted. The odontoid is within normal limits. Soft tissues and spinal canal: Surrounding soft tissue structures appear within normal limits. Upper chest: Visualized lung apices are unremarkable with the exception of some scarring in the right apex. Other: None IMPRESSION: Multilevel degenerative change without acute bony abnormality. Electronically Signed   By: Alcide Clever M.D.   On: 01/31/2023 01:25     PHYSICAL EXAM  Temp:  [98.2 F (36.8 C)-99.6 F (37.6 C)] 98.7 F (37.1 C) (12/12 1503) Pulse Rate:  [69-108] 91 (12/12 1503) Resp:  [18-21] 18 (12/12 1503) BP: (113-133)/(59-76) 133/60 (12/12 1503) SpO2:  [95 %-99 %] 97 % (12/12 1503)  General - Well nourished, well developed, not in acute distress.  Ophthalmologic - fundi not visualized due to noncooperation.  Cardiovascular - Regular rhythm and rate..  Neuro - initially napping, but easily arousable, but still sleepy, eyes open, orientated to place, age but not to time. Paucity of speech but with moderate dysarthria, seems improved from yesterday, but following most simple commands. Able to repeat in dysarthric voice. No gaze palsy, tracking bilaterally, visual field test showed right upper quadrantanopia, PERRL. No facial droop. Right facial dressing for wound from fall. Tongue midline. Bilateral UEs 4/5, no drift. Bilaterally LEs 3/5 proximal and 4/5 distally, no drift. Sensation symmetrical bilaterally, chronic left UE ataxia and left LE dysmetria, gait not tested.     ASSESSMENT/PLAN Grant Berry is a 77 y.o. male with history  of hypertension, hyperlipidemia, stroke admitted for multiple falls. No tPA given due to outside window.    Stroke:  left PCA large infarct likely secondary to left terminal ICA occlusion with compromised PCOM, likely large vessel disease  source CT head old left cerebellum and right PCA infarct.  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  Left ICA proximal 60% stenosis, right multifocal M1 moderate stenosis, right VA stenosis. MRI left MCA/PCA small to moderate infarct.  Old right PCA infarct. Due to developing expressive aphasia, repeat CT showed no acute change CTP no core infarct, increased TTP at posterior circulation and left MCA territory Status post IR with terminal ICA stenting and proximal ICA angioplasty with TICI3 reperfusion Post IR CT repeat left PCA progressive infarct. Hyperdense material in the left frontal sulci, which may represent subarachnoid hemorrhage versus contrast staining. MRI repeat Moderate acute infarct in the left occipital lobe with mild progression from yesterday. Interval punctate acute infarct in the left frontal white matter. 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 Bilateral P-comm and PCA patent Avoid low BP No intervention needed at this time, discussed with Dr. Corliss Skains  History of stroke 08/2019 admitted for dizziness, imbalance and leaning towards to the left.  CT showed left cerebellar infarct which confirmed on MRI.  MRI showed left SCA occlusion, left VA hypoplastic.  2D echo unremarkable.  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  Discharged on DAPT and Lipitor. Follow-up with Dr. Pearlean Brownie at [facility]  Delirium, improving Less restless and agitation  now Tele sitter is on at night On seroquel 25 bid -> 25 am and 50 hs  Hx of hypertension Hypotension post procedure Stable now S/p albumin Avoid low BP Long term BP goal normotensive  Hyperlipidemia Home meds: Zetia LDL 167, goal < 70 Statin intolerance (Lipitor and pravastatin) Continue Zetia at discharge Not candidate for Leqvio   Other Stroke Risk Factors Advanced age Former smoker  Other Active Problems AKI, creatinine 1.40--1.06--1.07--1.21 --1.21, encourage po intake, IVF for 24h  Hospital day # 4    Marvel Plan, MD PhD Stroke Neurology 02/04/2023 3:49 PM    To contact Stroke Continuity provider, please refer to [email or web address]. After hours, contact General Neurology

## 2023-02-05 LAB — BASIC METABOLIC PANEL
Anion gap: 10 (ref 5–15)
BUN: 14 mg/dL (ref 8–23)
CO2: 24 mmol/L (ref 22–32)
Calcium: 8.8 mg/dL — ABNORMAL LOW (ref 8.9–10.3)
Chloride: 104 mmol/L (ref 98–111)
Creatinine, Ser: 1.09 mg/dL (ref 0.61–1.24)
GFR, Estimated: >60 mL/min (ref >60)
Glucose, Bld: 98 mg/dL (ref 70–99)
Potassium: 3.5 mmol/L (ref 3.5–5.1)
Sodium: 138 mmol/L (ref 135–145)

## 2023-02-05 LAB — CBC
HCT: 28.9 % — ABNORMAL LOW (ref 39.0–52.0)
Hemoglobin: 9.5 g/dL — ABNORMAL LOW (ref 13.0–17.0)
MCH: 29.3 pg (ref 26.0–34.0)
MCHC: 32.9 g/dL (ref 30.0–36.0)
MCV: 89.2 fL (ref 80.0–100.0)
Platelets: 190 10*3/uL (ref 150–400)
RBC: 3.24 MIL/uL — ABNORMAL LOW (ref 4.22–5.81)
RDW: 14.2 % (ref 11.5–15.5)
WBC: 5.6 10*3/uL (ref 4.0–10.5)
nRBC: 0 % (ref 0.0–0.2)

## 2023-02-05 NOTE — TOC Progression Note (Signed)
Transition of Care [facility]) - Progression Note    Patient Details  Name: Grant Berry MRN: [id number] Date of Birth: 1946-02-16  Transition of Care [facility]) CM/SW Contact  Baldemar Lenis, [place] Phone Number: 02/05/2023, 3:30 PM  Clinical Narrative:   CSW following for discharge to SNF. Patient has no bed offers due to requiring a sitter, MD aware and working on medications. CSW to follow.    Expected Discharge Plan: Skilled Nursing Facility Barriers to Discharge: Continued Medical Work up, English as a second language teacher, Facility will not accept until restraint criteria met  Expected Discharge Plan and Services     Post Acute Care Choice: Skilled Nursing Facility Living arrangements for the past 2 months: Single Family Home                                       Social Determinants of Health (SDOH) Interventions SDOH Screenings   Food Insecurity: No Food Insecurity (01/31/2023)  Housing: Low Risk  (02/04/2023)  Transportation Needs: No Transportation Needs (01/31/2023)  Utilities: Not At Risk (01/31/2023)  Alcohol Screen: Low Risk  (08/03/2022)  Depression (PHQ2-9): Low Risk  (10/23/2022)  Financial Resource Strain: Low Risk  (08/03/2022)  Physical Activity: Insufficiently Active (08/03/2022)  Social Connections: Socially Isolated (08/03/2022)  Stress: No Stress Concern Present (08/03/2022)  Tobacco Use: Medium Risk (01/31/2023)    Readmission Risk Interventions     No data to display

## 2023-02-05 NOTE — Plan of Care (Signed)
  Problem: Education: Goal: Knowledge of disease or condition will improve Outcome: Progressing Goal: Knowledge of patient specific risk factors will improve Loraine Leriche N/A or DELETE if not current risk factor) Outcome: Progressing   Problem: Coping: Goal: Will verbalize positive feelings about self Outcome: Progressing   Problem: Health Behavior/Discharge Planning: Goal: Ability to manage health-related needs will improve Outcome: Progressing   Problem: Self-Care: Goal: Verbalization of feelings and concerns over difficulty with self-care will improve Outcome: Progressing   Problem: Nutrition: Goal: Risk of aspiration will decrease Outcome: Progressing

## 2023-02-05 NOTE — Progress Notes (Addendum)
STROKE TEAM PROGRESS NOTE   SUBJECTIVE (INTERVAL HISTORY) No family is at the bedside.  Patient lying in bed, awake alert, no significant neuro change, still has dysarthria and word salad. 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 Pt moving all extremities.    OBJECTIVE Temp:  [98.1 F (36.7 C)-98.7 F (37.1 C)] 98.2 F (36.8 C) (12/13 1556) Pulse Rate:  [65-100] 85 (12/13 1556) Cardiac Rhythm: Normal sinus rhythm (12/13 0900) Resp:  [17-18] 18 (12/13 1556) BP: (127-148)/(64-79) 128/65 (12/13 1556) SpO2:  [92 %-99 %] 97 % (12/13 1556)  Recent Labs  Lab 01/31/23 0035  GLUCAP 93   Recent Labs  Lab 02/01/23 0444 02/02/23 0455 02/03/23 0604 02/04/23 0727 02/05/23 0624  NA 137 136 136 136 138  K 3.7 3.4* 3.3* 3.5 3.5  CL 105 101 102 101 104  CO2 23 24 24 27 24   GLUCOSE 80 101* 104* 103* 98  BUN 9 14 12 16 14   CREATININE 1.06 1.07 1.21 1.21 1.09  CALCIUM 8.3* 9.4 9.2 8.7* 8.8*   Recent Labs  Lab 01/31/23 0040  AST 27  ALT 15  ALKPHOS 55  BILITOT 0.5  PROT 7.2  ALBUMIN 4.0   Recent Labs  Lab 01/31/23 0040 01/31/23 0042 02/01/23 0444 02/02/23 0455 02/03/23 0604 02/04/23 0727 02/05/23 0624  WBC 6.7   < > 6.3 8.4 8.8 7.5 5.6  NEUTROABS 4.5  --  4.5  --   --   --   --   HGB 12.5*   < > 9.3* 11.0* 10.5* 10.3* 9.5*  HCT 37.6*   < > 29.1* 32.5* 30.9* 30.9* 28.9*  MCV 90.6   < > 92.7 88.3 88.3 90.1 89.2  PLT 192   < > 153 165 168 188 190   < > = values in this interval not displayed.        Component Value Date/Time   CHOL 235 (H) 01/31/2023 0454   CHOL 202 (H) 07/10/2022 1012   CHOL 219 (H) 06/17/2012 1620   TRIG 47 01/31/2023 0454   TRIG 215 (H) 06/17/2012 1620   HDL 59 01/31/2023 0454   HDL 48 07/10/2022 1012   HDL 34 (L) 06/17/2012 1620   CHOLHDL 4.0 01/31/2023 0454   VLDL 9 01/31/2023 0454   LDLCALC 167 (H) 01/31/2023 0454   LDLCALC 134 (H) 07/10/2022 1012   LDLCALC 142 (H) 06/17/2012 1620   Lab Results  Component Value  Date   HGBA1C 5.1 01/31/2023      Component Value Date/Time   LABOPIA NONE DETECTED 01/31/2023 0039   COCAINSCRNUR NONE DETECTED 01/31/2023 0039   LABBENZ NONE DETECTED 01/31/2023 0039   AMPHETMU NONE DETECTED 01/31/2023 0039   THCU NONE DETECTED 01/31/2023 0039   LABBARB NONE DETECTED 01/31/2023 0039    Recent Labs  Lab 01/31/23 0040  ETH <10    I have personally reviewed the radiological images below and agree with the radiology interpretations.  MR BRAIN WO CONTRAST Addendum Date: 02/04/2023 ADDENDUM REPORT: 02/04/2023 21:09 ADDENDUM: Results were communicated by telephone at the time of interpretation on 02/04/2023 at 9 p.m. to provider Dr. Otelia Limes, who verbally acknowledged these results. Electronically Signed   By: Rise Mu M.D.   On: 02/04/2023 21:09   Result Date: 02/04/2023 CLINICAL DATA:  Follow-up examination for stroke. EXAM: MRI HEAD WITHOUT CONTRAST TECHNIQUE: Multiplanar, multiecho pulse sequences of the brain and surrounding structures were obtained without intravenous contrast. COMPARISON:  Previous MRI from 02/01/2023 and CT from  01/31/2023 FINDINGS: Brain: Atrophy with chronic microvascular ischemic disease. Evolving late subacute right PCA distribution infarct involving the right occipital lobe. Multifocal chronic infarcts involving the left greater than right cerebellum. Few scattered remote lacunar infarcts about the basal ganglia and pons. Continued interval evolution of subacute left PCA distribution infarct, similar in size as compared to previous. Few additional patchy foci of all vein subacute ischemia noted superiorly within the subcortical posterior left frontal parietal region, also similar. There has been evidence for hemorrhagic transformation with a hematoma measuring 2.7 x 1.5 cm now seen at the left occipital lobe (series 14, image 22). Additional new acute intraparenchymal hemorrhage at the posterior left basal ganglia measures approximately  2.2 x 1.9 cm (series 14, image 32). No significant regional mass effect. No intraventricular extension. Additional small volume subarachnoid hemorrhage at the anterior left frontal convexity, slightly less conspicuous as compared to prior. Additional scattered chronic micro hemorrhages noted, stable. No other new or interval infarction elsewhere. 3.7 cm benign arachnoid cyst at the left frontal vertex again noted. No other mass lesion or midline shift. No hydrocephalus. No extra-axial fluid collection. Vascular: Loss of normal flow void within the left vertebral artery, stable. Heterogeneous but preserved flow voids within the remaining posterior circulation. Normal flow voids seen within the anterior circulation. Skull and upper cervical spine: Craniocervical junction within normal limits. Bone marrow signal intensity normal. No scalp soft tissue abnormality. Sinuses/Orbits: Prior bilateral ocular lens replacement. Paranasal sinuses remain largely clear. No significant mastoid effusion. Other: 9 mm T2 hyperintense lesion within the right parotid gland (series 15, image 21), indeterminate. IMPRESSION: 1. Continued interval evolution of subacute left PCA distribution infarct, similar in size as compared to previous. Interval hemorrhagic transformation with a 2.7 x 1.5 cm hematoma at the left occipital lobe. Additional new acute intraparenchymal hemorrhage at the posterior left basal ganglia measuring 2.2 x 1.9 cm. No significant regional mass effect. No intraventricular extension. 2. Additional small volume subarachnoid hemorrhage at the anterior left frontal convexity, slightly less conspicuous as compared to prior. 3. Evolving late subacute right PCA distribution infarct, stable. 4. Underlying atrophy with chronic microvascular ischemic disease, with multiple chronic infarcts as above. 5. 9 mm T2 hyperintense lesion within the right parotid gland, indeterminate. Correlation with nonemergent outpatient ultrasound  suggested for further evaluation. The covering neurohospitalist has been paged regarding these findings, currently awaiting a call back. Results will be conveyed as soon as possible. Electronically Signed: By: Rise Mu M.D. On: 02/04/2023 20:59   IR PERCUTANEOUS ART THROMBECTOMY/INFUSION INTRACRANIAL INC DIAG ANGIO Result Date: 02/02/2023 INDICATION: History of repeated falls. Patient with new onset expressive aphasia and possible right sided weakness. 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 Also noted mid basilar artery occlusion. EXAM: 1. EMERGENT LARGE VESSEL OCCLUSION THROMBOLYSIS (anterior CIRCULATION) COMPARISON:  Recent CT angiogram of the head and neck, and MRI of the brain. MEDICATIONS: Ancef 2 g IV was administered within 1 hour of the procedure. ANESTHESIA/SEDATION: General anesthesia. CONTRAST:  Omnipaque 300 approximately 110 mL. FLUOROSCOPY TIME:  Fluoroscopy Time: 31 minutes 42 seconds (1110 mGy). COMPLICATIONS: None immediate. TECHNIQUE: Following a full explanation of the procedure along with the potential associated complications, an informed witnessed consent was obtained. The risks of intracranial hemorrhage of 10%, worsening neurological deficit, ventilator dependency, death and inability to revascularize were all reviewed in detail with the patient. The patient was then put under general anesthesia by the Department of Anesthesiology at [facility]  [facility]. The right groin was prepped and draped in the usual sterile fashion. Thereafter using modified Seldinger technique, transfemoral access into the right common femoral artery was obtained without difficulty. Over an 0.035 inch guidewire an 8 French 25 cm Pinnacle sheath was inserted. Through this, and also over an 0.035 inch guidewire a combination of a 125 cm 6 [place] Berenstein support catheter inside of an 087 95 cm balloon guide catheter was  advanced to the aortic arch region and advanced to the distal left common carotid artery. The guidewire, and the support catheter were removed. Good aspiration was obtained from the hub of the balloon guide catheter. A control arteriogram was then performed centered extra cranially and intracranially. FINDINGS: The left common carotid arteriogram demonstrates the left external carotid artery and its major branches to be widely patent. The left internal carotid artery just distal to the bulb has a focal severe stenosis. 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 PROCEDURE: Through the balloon guide catheter in the distal left ICA, and over an 014 inch 300 cm exchange micro guidewire positioned in the horizontal petrous segment, control angioplasty of the proximal left internal carotid stenosis was performed with a 4 mm x 30 cm Viatrac 14 angioplasty balloon catheter which had been prepped with 50% contrast and 50% heparinized saline infusion. Using the rapid exchange technique, the balloon was positioned with its markers adequate distant from the site of severe stenosis. A control inflation was then performed using a micro inflation syringe device via micro tubing to approximately 4 mm where it was maintained for approximately a minute and a half. Thereafter, following deflation of the balloon and removal a control arteriogram performed through the balloon guide catheter demonstrated now improved caliber of the angioplastied segment. At this time a 071 Zoom aspiration catheter was advanced in combination with an 021 160 cm Trevo ProVue microcatheter over the exchange micro guidewire to the proximal petrous segment. The 300 cm 014 exchange micro guidewire was then exchanged for an 018 inch micro guidewire with a moderate J configuration distally. The micro guidewire with  the microcatheter was then gently advanced without difficulty through the occluded supraclinoid left ICA to the middle cerebral artery inferior division M2 segment followed by the microcatheter. The guidewire was removed. Good aspiration obtained from the hub of the microcatheter. A gentle control arteriogram performed through this demonstrated safe positioning of the tip of the microcatheter. A 4 mm x 40 mm Solitaire X retrieval device was then deployed with the proximal portion in the supraclinoid left ICA. At this time the Zoom aspiration catheter was advanced without difficulty to the mid M1 segment. Control aspiration was then performed for approximately a minute and a half at the hub of the Zoom aspiration catheter, and with a 20 mL syringe at the hub of the balloon guide catheter for approximately a minute and a half. The combination was then retrieved and removed. Following reversal of flow arrest in the left internal carotid artery a control arteriogram performed demonstrated complete revascularization of the supraclinoid left ICA with opacification of the left middle cerebral artery achieving a TICI 3 revascularization. Also noted was opacification of the proximal left A1 segment with focal areas of severe stenosis due to arteriosclerosis. Bilateral PCOMs demonstrated opacification into the left posterior cerebral artery P1 segment. Underlying intracranial arteriosclerosis with resulting  severe stenosis was noted just proximal to the origins of the posterior communicating arteries. Through the balloon guide catheter in the distal left ICA, an aspiration 5 French 115 cm guide catheter was then advanced to the proximal cavernous segment over an 035 inch guidewire. Through this, 160 cm Trevo ProVue microcatheter was advanced over an 018 inch micro guidewire. The microcatheter and the micro guidewire were advanced to the distal M2 M3 regions of the inferior division followed by the microcatheter. The guidewire  was removed. Good aspiration obtained from the hub of the microcatheter. This in turn was exchanged for an 014 inch 300 cm exchange micro guidewire with a moderate J configuration at its distal end. A control arteriogram performed demonstrated safe positioning of the tip of the micro guidewire. Measurements were then performed of the left ICA supraclinoid segment just proximal to the bifurcation and just distal to the origin of the ophthalmic artery. A 3 mm x 12 mm Onyx Frontier balloon mounted stent which had been prepped antegradely with 50% contrast and 50% heparinized saline infusion, and retrogradely with heparinized saline infusion, was advanced in a coaxial manner and with constant heparinized saline infusion and positioned without difficulty with the stent markers adequate distant from the area of severe stenosis. A control inflation was then performed using a micro inflation syringe device via micro tubing to just over 3 mm x 2 where it was maintained for approximately a minute and a half. Following balloon deflation and retrieval and removal, a control arteriogram performed through the 5 [place] intermediate catheter demonstrated significantly improved caliber of the supraclinoid left ICA with a TICI 3 revascularization of the left MCA being persistent. Again demonstrated was the stenosed left anterior cerebral A1 segment. Over the exchange micro guidewire now in the proximal cavernous segment, a control arteriogram performed through the balloon guide catheter following removal of the intermediate catheter. Measurements were then performed of the left internal carotid artery proximally at the site of the significant stenosis and the distal left common carotid artery. A 6/8 mm x 40 mm Xact stent delivery apparatus was then advanced in a coaxial manner and with constant heparinized saline infusion and positioned without difficulty with the adequate coverage distal to the angioplastied segment and into the  distal left common carotid artery. The stent was then deployed in the usual manner without any difficulty. The delivery apparatus was removed. A control arteriogram performed through the balloon guide catheter demonstrated significantly improved caliber through the stented segment of the proximal left ICA. The waist at the site of the angioplasty was then treated with a 5 mm x 30 mm Viatrac angioplasty microcatheter which was advanced after its preparation as described above. Prior to the angioplasty, proximal flow arrest was initiated by inflating the balloon in the distal left common carotid artery, and with proximal aspiration during the angioplasty. Following the angioplasty, the balloon was retrieved and removed while aspiration was maintained as flow arrest was reversed. Control arteriograms were then performed through the balloon guide catheter over the next 10-20 minutes continued to demonstrate excellent flow through the centered in the proximal left ICA. Intracranially no change was seen in the TICI 3 revascularization of the left middle cerebral artery distribution. A 5 French diagnostic catheter was then advanced into the right common carotid artery over an 035 inch guidewire. The right common carotid artery injection demonstrated patency of the right internal carotid artery at the neck, and intracranially. The petrous, the cavernous and the supraclinoid right ICA demonstrate wide  patency. The right middle cerebral artery and the right anterior cerebral artery demonstrate wide patency into the capillary and venous phases with a 50% stenosis of the right middle cerebral artery M1 segment. Cross-filling via the anterior communicating artery of the left anterior cerebral A2 segment, and the left M1 segment was evident. An 8 French Angio-Seal closure device was deployed at the right groin puncture site for hemostasis. Distal pulses remained present unchanged in both feet at the end of the procedure. Flat  panel CT of the brain demonstrated no evidence of intracranial hemorrhage. Patient was extubated in was beginning to respond to simple commands appropriately slightly weaker on the right side. Medications utilized during the procedure. 81 mg of aspirin and Plavix 15 mg via orogastric tube prior to angioplasty. The patient was also given half bolus dose of cangrelor followed by a 4 hour half dose infusion to be followed by CT of the brain. Patient was then transferred to the neuro ICU for post revascularization care. 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 Underlying a significant atherosclerotic related stenosis treated with a 3 mm x 12 mm Onyx Frontier balloon mounted stent with significantly improved caliber and flow. Status post stent assisted angioplasty of proximal left ICA stenosis with proximal flow arrest as described without event. PLAN: Follow-up in the clinic 4 weeks post discharge. Electronically Signed   By: Julieanne Cotton M.D.   On: 02/02/2023 07:58   IR CT Head Ltd Result Date: 02/02/2023 INDICATION: History of repeated falls. Patient with new onset expressive aphasia and possible right sided weakness. 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 Also noted mid basilar artery occlusion. EXAM: 1. EMERGENT LARGE VESSEL OCCLUSION THROMBOLYSIS (anterior CIRCULATION) COMPARISON:  Recent CT angiogram of the head and neck, and MRI of the brain. MEDICATIONS: Ancef 2 g IV was administered within 1 hour of the procedure. ANESTHESIA/SEDATION: General anesthesia. CONTRAST:  Omnipaque 300 approximately 110 mL. FLUOROSCOPY TIME:  Fluoroscopy Time: 31 minutes 42 seconds (1110 mGy). COMPLICATIONS: None immediate. TECHNIQUE:  Following a full explanation of the procedure along with the potential associated complications, an informed witnessed consent was obtained. The risks of intracranial hemorrhage of 10%, worsening neurological deficit, ventilator dependency, death and inability to revascularize were all reviewed in detail with the patient. The patient was then put under general anesthesia by the Department of Anesthesiology at [facility]. The right groin was prepped and draped in the usual sterile fashion. Thereafter using modified Seldinger technique, transfemoral access into the right common femoral artery was obtained without difficulty. Over an 0.035 inch guidewire an 8 French 25 cm Pinnacle sheath was inserted. Through this, and also over an 0.035 inch guidewire a combination of a 125 cm 6 [place] Berenstein support catheter inside of an 087 95 cm balloon guide catheter was advanced to the aortic arch region and advanced to the distal left common carotid artery. The guidewire, and the support catheter were removed. Good aspiration was obtained from the hub of the balloon guide catheter. A control arteriogram was then performed centered extra cranially and intracranially. FINDINGS: The left common carotid arteriogram demonstrates the left external carotid artery and its major branches to be widely patent. The left internal carotid artery just distal to the bulb has a  focal severe stenosis. 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 PROCEDURE: Through the balloon guide catheter in the distal left ICA, and over an 014 inch 300 cm exchange micro guidewire positioned in the horizontal petrous segment, control angioplasty of the proximal left internal carotid stenosis was performed with a 4 mm x 30 cm Viatrac 14 angioplasty balloon catheter which had been prepped with 50% contrast  and 50% heparinized saline infusion. Using the rapid exchange technique, the balloon was positioned with its markers adequate distant from the site of severe stenosis. A control inflation was then performed using a micro inflation syringe device via micro tubing to approximately 4 mm where it was maintained for approximately a minute and a half. Thereafter, following deflation of the balloon and removal a control arteriogram performed through the balloon guide catheter demonstrated now improved caliber of the angioplastied segment. At this time a 071 Zoom aspiration catheter was advanced in combination with an 021 160 cm Trevo ProVue microcatheter over the exchange micro guidewire to the proximal petrous segment. The 300 cm 014 exchange micro guidewire was then exchanged for an 018 inch micro guidewire with a moderate J configuration distally. The micro guidewire with the microcatheter was then gently advanced without difficulty through the occluded supraclinoid left ICA to the middle cerebral artery inferior division M2 segment followed by the microcatheter. The guidewire was removed. Good aspiration obtained from the hub of the microcatheter. A gentle control arteriogram performed through this demonstrated safe positioning of the tip of the microcatheter. A 4 mm x 40 mm Solitaire X retrieval device was then deployed with the proximal portion in the supraclinoid left ICA. At this time the Zoom aspiration catheter was advanced without difficulty to the mid M1 segment. Control aspiration was then performed for approximately a minute and a half at the hub of the Zoom aspiration catheter, and with a 20 mL syringe at the hub of the balloon guide catheter for approximately a minute and a half. The combination was then retrieved and removed. Following reversal of flow arrest in the left internal carotid artery a control arteriogram performed demonstrated complete revascularization of the supraclinoid left ICA with  opacification of the left middle cerebral artery achieving a TICI 3 revascularization. Also noted was opacification of the proximal left A1 segment with focal areas of severe stenosis due to arteriosclerosis. Bilateral PCOMs demonstrated opacification into the left posterior cerebral artery P1 segment. Underlying intracranial arteriosclerosis with resulting severe stenosis was noted just proximal to the origins of the posterior communicating arteries. Through the balloon guide catheter in the distal left ICA, an aspiration 5 French 115 cm guide catheter was then advanced to the proximal cavernous segment over an 035 inch guidewire. Through this, 160 cm Trevo ProVue microcatheter was advanced over an 018 inch micro guidewire. The microcatheter and the micro guidewire were advanced to the distal M2 M3 regions of the inferior division followed by the microcatheter. The guidewire was removed. Good aspiration obtained from the hub of the microcatheter. This in turn was exchanged for an 014 inch 300 cm exchange micro guidewire with a moderate J configuration at its distal end. A control arteriogram performed demonstrated safe positioning of the tip of the micro guidewire. Measurements were then performed of the left ICA supraclinoid segment just proximal to the bifurcation and just distal to the origin  of the ophthalmic artery. A 3 mm x 12 mm Onyx Frontier balloon mounted stent which had been prepped antegradely with 50% contrast and 50% heparinized saline infusion, and retrogradely with heparinized saline infusion, was advanced in a coaxial manner and with constant heparinized saline infusion and positioned without difficulty with the stent markers adequate distant from the area of severe stenosis. A control inflation was then performed using a micro inflation syringe device via micro tubing to just over 3 mm x 2 where it was maintained for approximately a minute and a half. Following balloon deflation and retrieval and  removal, a control arteriogram performed through the 5 [place] intermediate catheter demonstrated significantly improved caliber of the supraclinoid left ICA with a TICI 3 revascularization of the left MCA being persistent. Again demonstrated was the stenosed left anterior cerebral A1 segment. Over the exchange micro guidewire now in the proximal cavernous segment, a control arteriogram performed through the balloon guide catheter following removal of the intermediate catheter. Measurements were then performed of the left internal carotid artery proximally at the site of the significant stenosis and the distal left common carotid artery. A 6/8 mm x 40 mm Xact stent delivery apparatus was then advanced in a coaxial manner and with constant heparinized saline infusion and positioned without difficulty with the adequate coverage distal to the angioplastied segment and into the distal left common carotid artery. The stent was then deployed in the usual manner without any difficulty. The delivery apparatus was removed. A control arteriogram performed through the balloon guide catheter demonstrated significantly improved caliber through the stented segment of the proximal left ICA. The waist at the site of the angioplasty was then treated with a 5 mm x 30 mm Viatrac angioplasty microcatheter which was advanced after its preparation as described above. Prior to the angioplasty, proximal flow arrest was initiated by inflating the balloon in the distal left common carotid artery, and with proximal aspiration during the angioplasty. Following the angioplasty, the balloon was retrieved and removed while aspiration was maintained as flow arrest was reversed. Control arteriograms were then performed through the balloon guide catheter over the next 10-20 minutes continued to demonstrate excellent flow through the centered in the proximal left ICA. Intracranially no change was seen in the TICI 3 revascularization of the left middle  cerebral artery distribution. A 5 French diagnostic catheter was then advanced into the right common carotid artery over an 035 inch guidewire. The right common carotid artery injection demonstrated patency of the right internal carotid artery at the neck, and intracranially. The petrous, the cavernous and the supraclinoid right ICA demonstrate wide patency. The right middle cerebral artery and the right anterior cerebral artery demonstrate wide patency into the capillary and venous phases with a 50% stenosis of the right middle cerebral artery M1 segment. Cross-filling via the anterior communicating artery of the left anterior cerebral A2 segment, and the left M1 segment was evident. An 8 French Angio-Seal closure device was deployed at the right groin puncture site for hemostasis. Distal pulses remained present unchanged in both feet at the end of the procedure. Flat panel CT of the brain demonstrated no evidence of intracranial hemorrhage. Patient was extubated in was beginning to respond to simple commands appropriately slightly weaker on the right side. Medications utilized during the procedure. 81 mg of aspirin and Plavix 15 mg via orogastric tube prior to angioplasty. The patient was also given half bolus dose of cangrelor followed by a 4 hour half dose infusion to be followed  by CT of the brain. Patient was then transferred to the neuro ICU for post revascularization care. 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 Underlying a significant atherosclerotic related stenosis treated with a 3 mm x 12 mm Onyx Frontier balloon mounted stent with significantly improved caliber and flow. Status post stent assisted angioplasty of proximal left ICA stenosis with proximal flow arrest as described without event.  PLAN: Follow-up in the clinic 4 weeks post discharge. Electronically Signed   By: Julieanne Cotton M.D.   On: 02/02/2023 07:58   IR INTRAVSC STENT CERV CAROTID W/O EMB-PROT MOD SED Result Date: 02/02/2023 INDICATION: History of repeated falls. Patient with new onset expressive aphasia and possible right sided weakness. 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 Also noted mid basilar artery occlusion. EXAM: 1. EMERGENT LARGE VESSEL OCCLUSION THROMBOLYSIS (anterior CIRCULATION) COMPARISON:  Recent CT angiogram of the head and neck, and MRI of the brain. MEDICATIONS: Ancef 2 g IV was administered within 1 hour of the procedure. ANESTHESIA/SEDATION: General anesthesia. CONTRAST:  Omnipaque 300 approximately 110 mL. FLUOROSCOPY TIME:  Fluoroscopy Time: 31 minutes 42 seconds (1110 mGy). COMPLICATIONS: None immediate. TECHNIQUE: Following a full explanation of the procedure along with the potential associated complications, an informed witnessed consent was obtained. The risks of intracranial hemorrhage of 10%, worsening neurological deficit, ventilator dependency, death and inability to revascularize were all reviewed in detail with the patient. The patient was then put under general anesthesia by the Department of Anesthesiology at [facility]. The right groin was prepped and draped in the usual sterile fashion. Thereafter using modified Seldinger technique, transfemoral access into the right common femoral artery was obtained without difficulty. Over an 0.035 inch guidewire an 8 French 25 cm Pinnacle sheath was inserted. Through this, and also over an 0.035 inch guidewire a combination of a 125 cm 6 [place] Berenstein support catheter inside of an 087 95 cm balloon guide catheter was advanced to the aortic arch region and advanced to the distal left common carotid artery. The guidewire, and the support catheter were removed. Good  aspiration was obtained from the hub of the balloon guide catheter. A control arteriogram was then performed centered extra cranially and intracranially. FINDINGS: The left common carotid arteriogram demonstrates the left external carotid artery and its major branches to be widely patent. The left internal carotid artery just distal to the bulb has a focal severe stenosis. 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 PROCEDURE: Through the balloon guide catheter in the distal left ICA, and over an 014 inch 300 cm exchange micro guidewire positioned in the horizontal petrous segment, control angioplasty of the proximal left internal carotid stenosis was performed with a 4 mm x 30 cm Viatrac 14 angioplasty balloon catheter which had been prepped with 50% contrast and 50% heparinized saline infusion. Using the rapid exchange technique, the balloon was positioned with its markers adequate distant from the site of severe stenosis. A control inflation was then performed using a micro inflation syringe device via micro tubing to approximately 4 mm where it was maintained for approximately a minute and  a half. Thereafter, following deflation of the balloon and removal a control arteriogram performed through the balloon guide catheter demonstrated now improved caliber of the angioplastied segment. At this time a 071 Zoom aspiration catheter was advanced in combination with an 021 160 cm Trevo ProVue microcatheter over the exchange micro guidewire to the proximal petrous segment. The 300 cm 014 exchange micro guidewire was then exchanged for an 018 inch micro guidewire with a moderate J configuration distally. The micro guidewire with the microcatheter was then gently advanced without difficulty through the occluded supraclinoid left ICA to the middle cerebral artery inferior  division M2 segment followed by the microcatheter. The guidewire was removed. Good aspiration obtained from the hub of the microcatheter. A gentle control arteriogram performed through this demonstrated safe positioning of the tip of the microcatheter. A 4 mm x 40 mm Solitaire X retrieval device was then deployed with the proximal portion in the supraclinoid left ICA. At this time the Zoom aspiration catheter was advanced without difficulty to the mid M1 segment. Control aspiration was then performed for approximately a minute and a half at the hub of the Zoom aspiration catheter, and with a 20 mL syringe at the hub of the balloon guide catheter for approximately a minute and a half. The combination was then retrieved and removed. Following reversal of flow arrest in the left internal carotid artery a control arteriogram performed demonstrated complete revascularization of the supraclinoid left ICA with opacification of the left middle cerebral artery achieving a TICI 3 revascularization. Also noted was opacification of the proximal left A1 segment with focal areas of severe stenosis due to arteriosclerosis. Bilateral PCOMs demonstrated opacification into the left posterior cerebral artery P1 segment. Underlying intracranial arteriosclerosis with resulting severe stenosis was noted just proximal to the origins of the posterior communicating arteries. Through the balloon guide catheter in the distal left ICA, an aspiration 5 French 115 cm guide catheter was then advanced to the proximal cavernous segment over an 035 inch guidewire. Through this, 160 cm Trevo ProVue microcatheter was advanced over an 018 inch micro guidewire. The microcatheter and the micro guidewire were advanced to the distal M2 M3 regions of the inferior division followed by the microcatheter. The guidewire was removed. Good aspiration obtained from the hub of the microcatheter. This in turn was exchanged for an 014 inch 300 cm exchange micro  guidewire with a moderate J configuration at its distal end. A control arteriogram performed demonstrated safe positioning of the tip of the micro guidewire. Measurements were then performed of the left ICA supraclinoid segment just proximal to the bifurcation and just distal to the origin of the ophthalmic artery. A 3 mm x 12 mm Onyx Frontier balloon mounted stent which had been prepped antegradely with 50% contrast and 50% heparinized saline infusion, and retrogradely with heparinized saline infusion, was advanced in a coaxial manner and with constant heparinized saline infusion and positioned without difficulty with the stent markers adequate distant from the area of severe stenosis. A control inflation was then performed using a micro inflation syringe device via micro tubing to just over 3 mm x 2 where it was maintained for approximately a minute and a half. Following balloon deflation and retrieval and removal, a control arteriogram performed through the 5 [place] intermediate catheter demonstrated significantly improved caliber of the supraclinoid left ICA with a TICI 3 revascularization of the left MCA being persistent. Again demonstrated was the stenosed left anterior cerebral A1 segment. Over the exchange micro guidewire now  in the proximal cavernous segment, a control arteriogram performed through the balloon guide catheter following removal of the intermediate catheter. Measurements were then performed of the left internal carotid artery proximally at the site of the significant stenosis and the distal left common carotid artery. A 6/8 mm x 40 mm Xact stent delivery apparatus was then advanced in a coaxial manner and with constant heparinized saline infusion and positioned without difficulty with the adequate coverage distal to the angioplastied segment and into the distal left common carotid artery. The stent was then deployed in the usual manner without any difficulty. The delivery apparatus was  removed. A control arteriogram performed through the balloon guide catheter demonstrated significantly improved caliber through the stented segment of the proximal left ICA. The waist at the site of the angioplasty was then treated with a 5 mm x 30 mm Viatrac angioplasty microcatheter which was advanced after its preparation as described above. Prior to the angioplasty, proximal flow arrest was initiated by inflating the balloon in the distal left common carotid artery, and with proximal aspiration during the angioplasty. Following the angioplasty, the balloon was retrieved and removed while aspiration was maintained as flow arrest was reversed. Control arteriograms were then performed through the balloon guide catheter over the next 10-20 minutes continued to demonstrate excellent flow through the centered in the proximal left ICA. Intracranially no change was seen in the TICI 3 revascularization of the left middle cerebral artery distribution. A 5 French diagnostic catheter was then advanced into the right common carotid artery over an 035 inch guidewire. The right common carotid artery injection demonstrated patency of the right internal carotid artery at the neck, and intracranially. The petrous, the cavernous and the supraclinoid right ICA demonstrate wide patency. The right middle cerebral artery and the right anterior cerebral artery demonstrate wide patency into the capillary and venous phases with a 50% stenosis of the right middle cerebral artery M1 segment. Cross-filling via the anterior communicating artery of the left anterior cerebral A2 segment, and the left M1 segment was evident. An 8 French Angio-Seal closure device was deployed at the right groin puncture site for hemostasis. Distal pulses remained present unchanged in both feet at the end of the procedure. Flat panel CT of the brain demonstrated no evidence of intracranial hemorrhage. Patient was extubated in was beginning to respond to simple  commands appropriately slightly weaker on the right side. Medications utilized during the procedure. 81 mg of aspirin and Plavix 15 mg via orogastric tube prior to angioplasty. The patient was also given half bolus dose of cangrelor followed by a 4 hour half dose infusion to be followed by CT of the brain. Patient was then transferred to the neuro ICU for post revascularization care. 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 Underlying a significant atherosclerotic related stenosis treated with a 3 mm x 12 mm Onyx Frontier balloon mounted stent with significantly improved caliber and flow. Status post stent assisted angioplasty of proximal left ICA stenosis with proximal flow arrest as described without event. PLAN: Follow-up in the clinic 4 weeks post discharge. Electronically Signed   By: Julieanne Cotton M.D.   On: 02/02/2023 07:58   IR Intra Cran Stent Result Date: 02/02/2023 INDICATION: History of repeated falls. Patient with new onset expressive aphasia and possible right sided weakness. CTA  demonstrates occluded left internal carotid artery at the terminus, and approximately 65% stenosis of the left ICA. Also noted mid basilar artery occlusion. EXAM: 1. EMERGENT LARGE VESSEL OCCLUSION THROMBOLYSIS (anterior CIRCULATION) COMPARISON:  Recent CT angiogram of the head and neck, and MRI of the brain. MEDICATIONS: Ancef 2 g IV was administered within 1 hour of the procedure. ANESTHESIA/SEDATION: General anesthesia. CONTRAST:  Omnipaque 300 approximately 110 mL. FLUOROSCOPY TIME:  Fluoroscopy Time: 31 minutes 42 seconds (1110 mGy). COMPLICATIONS: None immediate. TECHNIQUE: Following a full explanation of the procedure along with the potential associated complications, an informed witnessed consent was  obtained. The risks of intracranial hemorrhage of 10%, worsening neurological deficit, ventilator dependency, death and inability to revascularize were all reviewed in detail with the patient. The patient was then put under general anesthesia by the Department of Anesthesiology at [facility]. The right groin was prepped and draped in the usual sterile fashion. Thereafter using modified Seldinger technique, transfemoral access into the right common femoral artery was obtained without difficulty. Over an 0.035 inch guidewire an 8 French 25 cm Pinnacle sheath was inserted. Through this, and also over an 0.035 inch guidewire a combination of a 125 cm 6 [place] Berenstein support catheter inside of an 087 95 cm balloon guide catheter was advanced to the aortic arch region and advanced to the distal left common carotid artery. The guidewire, and the support catheter were removed. Good aspiration was obtained from the hub of the balloon guide catheter. A control arteriogram was then performed centered extra cranially and intracranially. FINDINGS: The left common carotid arteriogram demonstrates the left external carotid artery and its major branches to be widely patent. The left internal carotid artery just distal to the bulb has a focal severe stenosis. 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 PROCEDURE: Through the balloon guide catheter in the distal left ICA, and over an 014 inch 300 cm exchange micro guidewire positioned in the horizontal petrous segment, control angioplasty of the proximal left internal carotid stenosis was performed with a 4 mm x 30 cm Viatrac 14 angioplasty balloon catheter which had been prepped with 50% contrast and 50% heparinized saline infusion. Using the rapid exchange technique, the balloon was positioned with its markers adequate  distant from the site of severe stenosis. A control inflation was then performed using a micro inflation syringe device via micro tubing to approximately 4 mm where it was maintained for approximately a minute and a half. Thereafter, following deflation of the balloon and removal a control arteriogram performed through the balloon guide catheter demonstrated now improved caliber of the angioplastied segment. At this time a 071 Zoom aspiration catheter was advanced in combination with an 021 160 cm Trevo ProVue microcatheter over the exchange micro guidewire to the proximal petrous segment. The 300 cm 014 exchange micro guidewire was then exchanged for an 018 inch micro guidewire with a moderate J configuration distally. The micro guidewire with the microcatheter was then gently advanced without difficulty through the occluded supraclinoid left ICA to the middle cerebral artery inferior division M2 segment followed by the microcatheter. The guidewire was removed. Good aspiration obtained from the hub of the microcatheter. A gentle control arteriogram performed through this demonstrated safe positioning of the tip of the microcatheter. A 4 mm x 40 mm Solitaire X retrieval device was then deployed with  the proximal portion in the supraclinoid left ICA. At this time the Zoom aspiration catheter was advanced without difficulty to the mid M1 segment. Control aspiration was then performed for approximately a minute and a half at the hub of the Zoom aspiration catheter, and with a 20 mL syringe at the hub of the balloon guide catheter for approximately a minute and a half. The combination was then retrieved and removed. Following reversal of flow arrest in the left internal carotid artery a control arteriogram performed demonstrated complete revascularization of the supraclinoid left ICA with opacification of the left middle cerebral artery achieving a TICI 3 revascularization. Also noted was opacification of the proximal  left A1 segment with focal areas of severe stenosis due to arteriosclerosis. Bilateral PCOMs demonstrated opacification into the left posterior cerebral artery P1 segment. Underlying intracranial arteriosclerosis with resulting severe stenosis was noted just proximal to the origins of the posterior communicating arteries. Through the balloon guide catheter in the distal left ICA, an aspiration 5 French 115 cm guide catheter was then advanced to the proximal cavernous segment over an 035 inch guidewire. Through this, 160 cm Trevo ProVue microcatheter was advanced over an 018 inch micro guidewire. The microcatheter and the micro guidewire were advanced to the distal M2 M3 regions of the inferior division followed by the microcatheter. The guidewire was removed. Good aspiration obtained from the hub of the microcatheter. This in turn was exchanged for an 014 inch 300 cm exchange micro guidewire with a moderate J configuration at its distal end. A control arteriogram performed demonstrated safe positioning of the tip of the micro guidewire. Measurements were then performed of the left ICA supraclinoid segment just proximal to the bifurcation and just distal to the origin of the ophthalmic artery. A 3 mm x 12 mm Onyx Frontier balloon mounted stent which had been prepped antegradely with 50% contrast and 50% heparinized saline infusion, and retrogradely with heparinized saline infusion, was advanced in a coaxial manner and with constant heparinized saline infusion and positioned without difficulty with the stent markers adequate distant from the area of severe stenosis. A control inflation was then performed using a micro inflation syringe device via micro tubing to just over 3 mm x 2 where it was maintained for approximately a minute and a half. Following balloon deflation and retrieval and removal, a control arteriogram performed through the 5 [place] intermediate catheter demonstrated significantly improved caliber of  the supraclinoid left ICA with a TICI 3 revascularization of the left MCA being persistent. Again demonstrated was the stenosed left anterior cerebral A1 segment. Over the exchange micro guidewire now in the proximal cavernous segment, a control arteriogram performed through the balloon guide catheter following removal of the intermediate catheter. Measurements were then performed of the left internal carotid artery proximally at the site of the significant stenosis and the distal left common carotid artery. A 6/8 mm x 40 mm Xact stent delivery apparatus was then advanced in a coaxial manner and with constant heparinized saline infusion and positioned without difficulty with the adequate coverage distal to the angioplastied segment and into the distal left common carotid artery. The stent was then deployed in the usual manner without any difficulty. The delivery apparatus was removed. A control arteriogram performed through the balloon guide catheter demonstrated significantly improved caliber through the stented segment of the proximal left ICA. The waist at the site of the angioplasty was then treated with a 5 mm x 30 mm Viatrac angioplasty microcatheter which was advanced after  its preparation as described above. Prior to the angioplasty, proximal flow arrest was initiated by inflating the balloon in the distal left common carotid artery, and with proximal aspiration during the angioplasty. Following the angioplasty, the balloon was retrieved and removed while aspiration was maintained as flow arrest was reversed. Control arteriograms were then performed through the balloon guide catheter over the next 10-20 minutes continued to demonstrate excellent flow through the centered in the proximal left ICA. Intracranially no change was seen in the TICI 3 revascularization of the left middle cerebral artery distribution. A 5 French diagnostic catheter was then advanced into the right common carotid artery over an 035  inch guidewire. The right common carotid artery injection demonstrated patency of the right internal carotid artery at the neck, and intracranially. The petrous, the cavernous and the supraclinoid right ICA demonstrate wide patency. The right middle cerebral artery and the right anterior cerebral artery demonstrate wide patency into the capillary and venous phases with a 50% stenosis of the right middle cerebral artery M1 segment. Cross-filling via the anterior communicating artery of the left anterior cerebral A2 segment, and the left M1 segment was evident. An 8 French Angio-Seal closure device was deployed at the right groin puncture site for hemostasis. Distal pulses remained present unchanged in both feet at the end of the procedure. Flat panel CT of the brain demonstrated no evidence of intracranial hemorrhage. Patient was extubated in was beginning to respond to simple commands appropriately slightly weaker on the right side. Medications utilized during the procedure. 81 mg of aspirin and Plavix 15 mg via orogastric tube prior to angioplasty. The patient was also given half bolus dose of cangrelor followed by a 4 hour half dose infusion to be followed by CT of the brain. Patient was then transferred to the neuro ICU for post revascularization care. 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 Underlying a significant atherosclerotic related stenosis treated with a 3 mm x 12 mm Onyx Frontier balloon mounted stent with significantly improved caliber and flow. Status post stent assisted angioplasty of proximal left ICA stenosis with proximal flow arrest as described without event. PLAN: Follow-up in the clinic 4 weeks post discharge. Electronically Signed   By: Julieanne Cotton M.D.   On: 02/02/2023 07:58    ECHOCARDIOGRAM COMPLETE Result Date: 02/01/2023    ECHOCARDIOGRAM REPORT   Patient Name:   Grant Berry Date of Exam: 02/01/2023 Medical Rec #:  [id number]          Height:       67.0 in Accession #:    [id number]         Weight:       119.3 lb Date of Birth:  [birth year]          BSA:          1.623 m Patient Age:    77 years           BP:           172/87 mmHg Patient Gender: M                  HR:           92 bpm. Exam Location:  Inpatient Procedure: 2D Echo, Cardiac Doppler and Color Doppler Indications:    Stroke  History:  Patient has prior history of Echocardiogram examinations, most                 recent 09/02/2019. Stroke; Risk Factors:Hypertension and                 Dyslipidemia.  Sonographer:    Milda Smart Referring Phys: [id number] [facility]  Sonographer Comments: Suboptimal parasternal window. Image acquisition challenging due to patient body habitus and Image acquisition challenging due to respiratory motion. IMPRESSIONS  1. Left ventricular ejection fraction, by estimation, is 60 to 65%. The left ventricle has normal function. The left ventricle has no regional wall motion abnormalities. Left ventricular diastolic parameters are consistent with Grade I diastolic dysfunction (impaired relaxation).  2. Right ventricular systolic function is normal. The right ventricular size is normal.  3. The mitral valve is abnormal. No evidence of mitral valve regurgitation. No evidence of mitral stenosis.  4. The aortic valve was not well visualized. There is mild calcification of the aortic valve. There is mild thickening of the aortic valve. Aortic valve regurgitation is not visualized. Aortic valve sclerosis is present, with no evidence of aortic valve  stenosis.  5. The inferior vena cava is normal in size with greater than 50% respiratory variability, suggesting right atrial pressure of 3 mmHg. FINDINGS  Left Ventricle: Left ventricular ejection fraction, by estimation, is 60 to 65%.  The left ventricle has normal function. The left ventricle has no regional wall motion abnormalities. The left ventricular internal cavity size was normal in size. There is  no left ventricular hypertrophy. Left ventricular diastolic parameters are consistent with Grade I diastolic dysfunction (impaired relaxation). Right Ventricle: The right ventricular size is normal. No increase in right ventricular wall thickness. Right ventricular systolic function is normal. Left Atrium: Left atrial size was normal in size. Right Atrium: Right atrial size was normal in size. Pericardium: There is no evidence of pericardial effusion. Mitral Valve: The mitral valve is abnormal. There is mild thickening of the mitral valve leaflet(s). There is mild calcification of the mitral valve leaflet(s). Mild mitral annular calcification. No evidence of mitral valve regurgitation. No evidence of mitral valve stenosis. MV peak gradient, 5.8 mmHg. The mean mitral valve gradient is 3.0 mmHg. Tricuspid Valve: The tricuspid valve is normal in structure. Tricuspid valve regurgitation is not demonstrated. No evidence of tricuspid stenosis. Aortic Valve: The aortic valve was not well visualized. There is mild calcification of the aortic valve. There is mild thickening of the aortic valve. Aortic valve regurgitation is not visualized. Aortic valve sclerosis is present, with no evidence of aortic valve stenosis. Pulmonic Valve: The pulmonic valve was normal in structure. Pulmonic valve regurgitation is not visualized. No evidence of pulmonic stenosis. Aorta: The aortic root is normal in size and structure. Venous: The inferior vena cava is normal in size with greater than 50% respiratory variability, suggesting right atrial pressure of 3 mmHg. IAS/Shunts: No atrial level shunt detected by color flow Doppler.  LEFT VENTRICLE PLAX 2D LVIDd:         4.40 cm   Diastology LVIDs:         3.20 cm   LV e' medial:    6.20 cm/s LV PW:         0.80 cm   LV E/e'  medial:  13.4 LV IVS:        0.60 cm   LV e' lateral:   7.94 cm/s LVOT diam:     2.50 cm   LV E/e' lateral:  10.5 LVOT Area:     4.91 cm  RIGHT VENTRICLE             IVC RV S prime:     17.80 cm/s  IVC diam: 1.40 cm TAPSE (M-mode): 2.5 cm LEFT ATRIUM             Index        RIGHT ATRIUM           Index LA diam:        3.30 cm 2.03 cm/m   RA Area:     11.70 cm LA Vol (A2C):   19.1 ml 11.77 ml/m  RA Volume:   26.80 ml  16.51 ml/m LA Vol (A4C):   27.2 ml 16.76 ml/m LA Biplane Vol: 23.8 ml 14.66 ml/m   AORTA Ao Root diam: 3.60 cm Ao Asc diam:  3.40 cm MITRAL VALVE MV Area (PHT): 1.73 cm     SHUNTS MV Peak grad:  5.8 mmHg     Systemic Diam: 2.50 cm MV Mean grad:  3.0 mmHg MV Vmax:       1.20 m/s MV Vmean:      83.8 cm/s MV Decel Time: 438 msec MV E velocity: 83.30 cm/s MV A velocity: 108.00 cm/s MV E/A ratio:  0.77 Charlton Haws MD Electronically signed by Charlton Haws MD Signature Date/Time: 02/01/2023/3:01:07 PM    Final    MR BRAIN WO CONTRAST Result Date: 02/01/2023 CLINICAL DATA:  Stroke and embolectomy. EXAM: MRI HEAD WITHOUT CONTRAST TECHNIQUE: Multiplanar, multiecho pulse sequences of the brain and surrounding structures were obtained without intravenous contrast. COMPARISON:  Head CT and MRI from yesterday FINDINGS: Brain: Moderate acute infarct in the left occipital cortex, increased from yesterday. Punctate acute infarct in the superior left frontal white matter since prior. There are areas of low-grade restricted diffusion in the left periatrial white matter and right occipital pole attributed to subacute ischemia. Subarachnoid hemorrhage along the anterior left frontal convexity, distinct from any areas of acute infarction and known from prior head CT. Pre-existing bilateral cerebellar and brainstem small infarcts. Chronic lacunar infarcts in theright caudate. Ischemic gliosis is mild. 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 Generalized brain atrophy.  Vascular: No flow seen in the left vertebral artery. There is a basilar flow void. Skull and upper cervical spine: No focal marrow lesion Sinuses/Orbits: No acute finding IMPRESSION: 1. Moderate acute infarct in the left occipital lobe with mild progression from yesterday. Interval punctate acute infarct in the left frontal white matter. 2. Areas of chronic and subacute ischemia preferentially affecting the posterior circulation. 3. Known subarachnoid hemorrhage at the anterior left frontal convexity, stable from preceding head CT. Electronically Signed   By: Tiburcio Pea M.D.   On: 02/01/2023 04:58   CT HEAD WO CONTRAST ( ) Result Date: 01/31/2023 CLINICAL DATA:  Stroke, follow-up; post canrelor infusion EXAM: CT HEAD WITHOUT CONTRAST TECHNIQUE: Contiguous axial images were obtained from the base of the skull through the vertex without intravenous contrast. RADIATION DOSE REDUCTION: This exam was performed according to the departmental dose-optimization program which includes automated exposure control, adjustment of the mA and/or kV according to patient size and/or use of iterative reconstruction technique. COMPARISON:  CT head 10/01/2022 and intra procedural CT 10/01/2022 FINDINGS: Brain: Hyperdense material in the left frontal sulci (series 5, image 47 and 60 and series 3, images 15-18 and 25 area and series 3, image 25), which may represent subarachnoid hemorrhage versus contrast staining. This was  not apparent on the immediate postprocedural CT head. Redemonstrated cytotoxic edema in the in the left occipital lobe (series 3, image 14), which is slightly more prominent than on the prior exam. More heterogeneous hypodensity in the right occipital lobe, which is favored to be chronic remote infarcts in the pons and bilateral cerebellar hemispheres. No evidence of acute infarction, hemorrhage, mass, mass effect, or midline shift. No hydrocephalus or extra-axial fluid collection. Vascular: No hyperdense  vessel. Atherosclerotic calcifications in the intracranial carotid and vertebral arteries. Skull: Negative for fracture or focal lesion. Sinuses/Orbits: Mucosal thickening in the ethmoid air cells. No acute finding in the orbits. Status post bilateral lens replacements. Other: The mastoid air cells are well aerated. IMPRESSION: 1. Hyperdense material in the left frontal sulci, which may represent subarachnoid hemorrhage versus contrast staining. This was not apparent on the immediate postprocedural CT head. Recommend short-term follow-up head CT. 2. Redemonstrated cytotoxic edema in the left occipital lobe, which is slightly more prominent than on the prior exam. These results will be called to the ordering clinician or representative by the Radiologist Assistant, and communication documented in the PACS or [employer]. Electronically Signed   By: Wiliam Ke M.D.   On: 01/31/2023 20:52   CT CEREBRAL PERFUSION W CONTRAST Result Date: 01/31/2023 CLINICAL DATA:  Code stroke. 77 year old male. CTA yesterday demonstrating left ICA terminus, distal left vertebral artery, Basilar artery occlusions. EXAM: CT PERFUSION BRAIN TECHNIQUE: Multiphase CT imaging of the brain was performed following IV bolus contrast injection. Subsequent parametric perfusion maps were calculated using RAPID software. RADIATION DOSE REDUCTION: This exam was performed according to the departmental dose-optimization program which includes automated exposure control, adjustment of the mA and/or kV according to patient size and/or use of iterative reconstruction technique. CONTRAST:  40mL OMNIPAQUE IOHEXOL 350 MG/ML SOLN COMPARISON:  Head CTs, CTA head and neck, and brain MRI earlier today. FINDINGS: CT Brain Perfusion Findings: CBF (<30%) Volume: 0mL. No CBF or CBV parameter abnormality is detected. Perfusion (Tmax>6.0s) volume: . Fairly symmetric abnormal T-max throughout the bilateral cerebellum, posterior cerebral hemispheres.  T-max > 4 seconds is more pronounced in the left cerebral hemisphere including the left MCA territory. There is volume of minimal T-max > 10 seconds in the right posterior circulation. Mismatch Volume: Infarct Core: No infarct core is detected Infarction Location:Oligemia throughout the posterior circulation, to a lesser extent the left MCA territory. IMPRESSION: Oligemia throughout the bilateral posterior circulation, including the posterior fossa. Less pronounced oligemia in the Left MCA territory. No infarct core, CBF or CBV parameter abnormality detected by CTP. These results were communicated to Dr. Roda Shutters at 8:12 am on 01/31/2023 by text page via the [facility] messaging system. Electronically Signed   By: Odessa Fleming M.D.   On: 01/31/2023 08:12   CT HEAD CODE STROKE WO CONTRAST Result Date: 01/31/2023 CLINICAL DATA:  Code stroke. 77 year old male. CTA yesterday demonstrating left ICA terminus, distal left vertebral artery, Basilar artery occlusions. EXAM: CT HEAD WITHOUT CONTRAST TECHNIQUE: Contiguous axial images were obtained from the base of the skull through the vertex without intravenous contrast. RADIATION DOSE REDUCTION: This exam was performed according to the departmental dose-optimization program which includes automated exposure control, adjustment of the mA and/or kV according to patient size and/or use of iterative reconstruction technique. COMPARISON:  Brain MRI 0244 hours today. CTA head and neck 0056 hours today. Plain head CT 0050 hours today. FINDINGS: Brain: Left occipital pole cytotoxic edema is slightly more conspicuous on CT now. Heterogeneous right occipital pole  redemonstrated. Chronic left central pontine and bilateral cerebellar infarcts on MRI appears stable by CT. No acute intracranial hemorrhage identified. No new cytotoxic edema compared to the earlier MRI. No midline shift, mass effect, or evidence of intracranial mass lesion. No ventriculomegaly. Vascular: Extensive Calcified  atherosclerosis at the skull base. See also CTA earlier today. Skull: Stable and intact. Sinuses/Orbits: Visualized paranasal sinuses and mastoids are clear. Other: No acute orbit or scalp soft tissue finding. ASPECTS [facility] Stroke Program Early CT Score) Total score (0-10 with 10 being normal): 10; occipital pole cytotoxic edema. IMPRESSION: 1. Stable non contrast CT appearance of the brain since 0050 hours today: Occipital pole Cytotoxic edema stable from the MRI at 0242 hours today. No new edema, no intracranial hemorrhage, or mass effect. 2. 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 Electronically Signed   By: Odessa Fleming M.D.   On: 01/31/2023 08:08   MR BRAIN WO CONTRAST Result Date: 01/31/2023 CLINICAL DATA:  Dizziness and gait instability. Large vessel occlusion. EXAM: MRI HEAD WITHOUT CONTRAST TECHNIQUE: Multiplanar, multiecho pulse sequences of the brain and surrounding structures were obtained without intravenous contrast. COMPARISON:  CTA head neck 01/31/2023 Brain MRI 09/01/2019 FINDINGS: Brain: There are areas of acute/early subacute ischemia within both occipital lobes, left-greater-than-right. These areas correspond to the areas of infarction seen on the earlier noncontrast head CT (suggested to be chronic at that time). There is no acute ischemia of the brainstem, cerebellum or anterior circulation. There is multifocal hyperintense T2-weighted signal within the white matter. Generalized volume loss. There is hyperintense T2-weighted signal that corresponds to both sites of infarction. There is a component of chronic infarct within the superior right occipital lobe (series 11, image 25). There are multiple old cerebellar infarcts. The midline structures are normal. Vascular: Abnormal distal basilar artery flow void in keeping with known occlusion. Abnormal flow void at the skull base left ICA covers a longer segment than the demonstrated occlusion on the earlier CTA, likely due to  inhibited upstream flow. Skull and upper cervical spine: Normal calvarium and skull base. Visualized upper cervical spine and soft tissues are normal. Sinuses/Orbits:No paranasal sinus fluid levels or advanced mucosal thickening. No mastoid or middle ear effusion. Normal orbits. IMPRESSION: 1. Acute/early subacute infarcts within both occipital lobes, left-greater-than-right. 2. No brainstem, cerebellar or anterior circulation acute ischemia. 3. Abnormal skull base flow voids in keeping with known vascular and left ICA occlusions. 4. Old superior right occipital and bilateral cerebellar infarcts. Electronically Signed   By: Deatra Robinson M.D.   On: 01/31/2023 03:24   CT ANGIO HEAD NECK W WO CM (CODE STROKE) Addendum Date: 01/31/2023 ADDENDUM REPORT: 01/31/2023 02:45 ADDENDUM: In addition to the above described ICA occlusion, the distal basilar artery is occluded. There is reconstitution of the basilar tip with patency of both PCAs maintained. The superior cerebellar arteries are occluded at their origin. Please note that this addendum was previously mistakenly associated with the noncontrast head CT for this patient performed on the same day. Electronically Signed   By: Deatra Robinson M.D.   On: 01/31/2023 02:45   Result Date: 01/31/2023 CLINICAL DATA:  Fall with dysarthria and dizziness EXAM: CT ANGIOGRAPHY HEAD AND NECK WITH AND WITHOUT CONTRAST TECHNIQUE: Multidetector CT imaging of the head and neck was performed using the standard protocol during bolus administration of intravenous contrast. Multiplanar CT image reconstructions and MIPs were obtained to evaluate the vascular anatomy. Carotid stenosis measurements (when applicable) are obtained utilizing NASCET criteria, using the distal internal carotid  diameter as the denominator. RADIATION DOSE REDUCTION: This exam was performed according to the departmental dose-optimization program which includes automated exposure control, adjustment of the mA and/or  kV according to patient size and/or use of iterative reconstruction technique. CONTRAST:  75mL OMNIPAQUE IOHEXOL 350 MG/ML SOLN COMPARISON:  None Available. FINDINGS: CTA NECK FINDINGS SKELETON: No acute abnormality or high grade bony spinal canal stenosis. OTHER NECK: Normal pharynx, larynx and major salivary glands. No cervical lymphadenopathy. Unremarkable thyroid gland. UPPER CHEST: No pneumothorax or pleural effusion. No nodules or masses. AORTIC ARCH: There is calcific atherosclerosis of the aortic arch. Conventional 3 vessel aortic branching pattern. RIGHT CAROTID SYSTEM: No dissection, occlusion or aneurysm. There is mixed density atherosclerosis extending into the proximal ICA, resulting in less than 50% stenosis. LEFT CAROTID SYSTEM: No dissection, occlusion or aneurysm. There is mixed density atherosclerosis extending into the proximal ICA, resulting in 60% stenosis. VERTEBRAL ARTERIES: Right dominant configuration. Diminutive left vertebral artery with probable short segment occlusion of the V3 segment. Normal right vertebral artery to the skull base. CTA HEAD FINDINGS POSTERIOR CIRCULATION: Right vertebral artery atherosclerosis with moderate stenosis. Normal left V4 segment. No proximal occlusion of the anterior or inferior cerebellar arteries. Basilar artery is normal. Superior cerebellar arteries are normal. Posterior cerebral arteries are normal. ANTERIOR CIRCULATION: The left ICA is occluded at its terminus within occluded length of approximately 9 mm. The right ICA is mildly atherosclerotic without stenosis. Anterior cerebral arteries are normal. Moderate multifocal stenosis of the M1 segment of the right MCA. The left MCA is patent, likely filled by collateral flow along the circle-of-Willis. There is multifocal mild atherosclerotic irregularity without high-grade stenosis. VENOUS SINUSES: As permitted by contrast timing, patent. ANATOMIC VARIANTS: None Review of the MIP images confirms the above  findings. IMPRESSION: 1. 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 The left MCA is patent, likely filled by collateral flow along the circle-of-Willis. 2. Moderate multifocal stenosis of the M1 segment of the right MCA. 3. Moderate right vertebral artery stenosis. 4. Bilateral carotid bifurcation atherosclerosis with 60% stenosis of the proximal left ICA. 5. Diminutive left vertebral artery with probable short segment occlusion of the V3 segment. Aortic Atherosclerosis (ICD10-I70.0). Critical Value/emergent results were called by telephone at the time of interpretation on 01/31/2023 at 1:17 am to provider [facility] , who verbally acknowledged these results. Electronically Signed: By: Deatra Robinson M.D. 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 There is reconstitution of the basilar tip with patency of both PCAs maintained. The superior cerebellar arteries are occluded at their origin. Electronically Signed   By: Deatra Robinson M.D.   On: 01/31/2023 01:30   Result Date: 01/31/2023 CLINICAL DATA:  Code stroke.  Fall with dizziness EXAM: CT HEAD WITHOUT CONTRAST TECHNIQUE: Contiguous axial images were obtained from the base of the skull through the vertex without intravenous contrast. RADIATION DOSE REDUCTION: This exam was performed according to the departmental dose-optimization program which includes automated exposure control, adjustment of the mA and/or kV according to patient size and/or use of iterative reconstruction technique. COMPARISON:  None Available. FINDINGS: Brain: There is no mass, hemorrhage or extra-axial collection. The size and configuration of the ventricles and extra-axial CSF spaces are normal. There are multiple old cerebellar small vessel infarcts.  Bilateral PCA territory infarctions also  appear chronic. There is mild white matter hypoattenuation. Vascular: Atherosclerosis of the carotid and vertebral arteries at the skull base. Skull: Normal Sinuses/Orbits: No fluid levels or advanced mucosal thickening of the visualized paranasal sinuses. No mastoid or middle ear effusion. The orbits are normal. ASPECTS [facility] Stroke Program Early CT Score) - Ganglionic level infarction (caudate, lentiform nuclei, internal capsule, insula, M1-M3 cortex): 7 - Supraganglionic infarction (M4-M6 cortex): 3 Total score (0-10 with 10 being normal): 10 IMPRESSION: 1. No acute intracranial abnormality. 2. ASPECTS is 10. 3. 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 4. Bilateral PCA territory infarctions also appear chronic. These results were called by telephone at the time of interpretation on 01/31/2023 at 1:00 am to provider [facility] , who verbally acknowledged these results. Electronically Signed: By: Deatra Robinson M.D. On: 01/31/2023 01:00   CT C-SPINE NO CHARGE Result Date: 01/31/2023 CLINICAL DATA:  Neck pain, known left ICA occlusion EXAM: CT CERVICAL SPINE WITHOUT CONTRAST TECHNIQUE: Multidetector CT imaging of the cervical spine was performed without intravenous contrast. Multiplanar CT image reconstructions were also generated. RADIATION DOSE REDUCTION: This exam was performed according to the departmental dose-optimization program which includes automated exposure control, adjustment of the mA and/or kV according to patient size and/or use of iterative reconstruction technique. COMPARISON:  None Available. FINDINGS: Alignment: Within normal limits. Skull base and vertebrae: 7 cervical segments are well visualized. Vertebral body height is well maintained. Mild osteophytic change and facet hypertrophic changes noted. No acute fracture or acute facet abnormality is noted. The odontoid is within normal limits. Soft tissues and spinal canal: Surrounding soft  tissue structures appear within normal limits. Upper chest: Visualized lung apices are unremarkable with the exception of some scarring in the right apex. Other: None IMPRESSION: Multilevel degenerative change without acute bony abnormality. Electronically Signed   By: Alcide Clever M.D.   On: 01/31/2023 01:25     PHYSICAL EXAM  Temp:  [98.1 F (36.7 C)-98.7 F (37.1 C)] 98.2 F (36.8 C) (12/13 1556) Pulse Rate:  [65-100] 85 (12/13 1556) Resp:  [17-18] 18 (12/13 1556) BP: (127-148)/(64-79) 128/65 (12/13 1556) SpO2:  [92 %-99 %] 97 % (12/13 1556)  General - Well nourished, well developed, not in acute distress.  Ophthalmologic - fundi not visualized due to noncooperation.  Cardiovascular - Regular rhythm and rate..  Neuro - initially napping, but easily arousable, but still sleepy, eyes open, orientated to place, age but not to time. Paucity of speech but with moderate dysarthria, seems improved from yesterday, but following most simple commands. Able to repeat in dysarthric voice. No gaze palsy, tracking bilaterally, right hemianopia, PERRL. No facial droop. Right facial dressing for wound from fall. Tongue midline. Bilateral UEs 4/5, no drift. Bilaterally LEs 3/5 proximal and 4/5 distally, no drift. Sensation symmetrical bilaterally, chronic left UE ataxia and left LE dysmetria, gait not tested.     ASSESSMENT/PLAN Grant Berry is a 77 y.o. male with history of hypertension, hyperlipidemia, stroke admitted for multiple falls. No tPA given due to outside window.    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  Left ICA proximal 60% stenosis, right multifocal M1 moderate stenosis, right VA  stenosis. MRI left MCA/PCA small to moderate infarct.  Old right PCA infarct. Due to developing expressive aphasia, repeat CT showed no acute change CTP no core infarct, increased TTP at posterior circulation and left MCA territory Status post IR with terminal ICA stenting and proximal ICA angioplasty with TICI3 reperfusion Post IR CT repeat left PCA progressive infarct. Hyperdense material in the left frontal sulci, which may represent subarachnoid hemorrhage versus contrast staining. MRI repeat Moderate acute infarct in the left occipital lobe with mild progression from yesterday. Interval punctate acute infarct in the left frontal white matter. 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 MRI repeat 12/12 showed Interval hemorrhagic transformation with a 2.7 x 1.5 cm hematoma at the left occipital lobe. Additional new acute intraparenchymal hemorrhage at the posterior left basal ganglia measuring 2.2 x 1.9 cm CT repeat pending 2D Echo EF 60 to 65% LDL 167 HgbA1c 5.1 UDS negative Lovenox for VTE prophylaxis No antithrombotic prior to admission, now on aspirin 81 mg daily and clopidogrel 75 mg daily for intracranial stenting. 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 Bilateral P-comm and PCA patent Avoid low BP No intervention needed at this time, discussed with Dr. Corliss Skains  History of stroke 08/2019 admitted for dizziness, imbalance and leaning towards to the left.  CT showed left cerebellar infarct which confirmed on MRI.  MRI showed left SCA occlusion, left VA hypoplastic.  2D echo unremarkable.  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  Discharged on DAPT and Lipitor. Follow-up with Dr. Pearlean Brownie at  [facility]  Delirium, improving Less restless and agitation now Tele sitter is on at night On seroquel 25 bid -> 25 am and 50 hs  Hx of hypertension Hypotension post procedure Stable now S/p albumin Avoid low BP Long term BP goal normotensive  Hyperlipidemia Home meds: Zetia LDL 167, goal < 70 Statin intolerance (Lipitor and pravastatin) Continue Zetia at discharge Not candidate for Leqvio   Other Stroke Risk Factors Advanced age Former smoker  Other Active Problems AKI, creatinine 1.40--1.06--1.07--1.21 --1.21--1.09, encourage po intake  Hospital day # 5  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 I spent extensive in total face-to-face time with the patient, more than 50% of which was spent in counseling and coordination of care, reviewing test results, images and medication, and discussing the diagnosis, treatment plan and potential prognosis. This patient's care requiresreview of multiple databases, neurological assessment, discussion with family, other specialists and medical decision making of high complexity.   Marvel Plan, MD PhD Stroke Neurology 02/05/2023 6:44 PM    To contact Stroke Continuity provider, please refer to [email or web address]. After hours, contact General Neurology

## 2023-02-05 NOTE — Plan of Care (Signed)
   Problem: Education: Goal: Knowledge of disease or condition will improve Outcome: Progressing Goal: Knowledge of secondary prevention will improve (MUST DOCUMENT ALL) Outcome: Progressing Goal: Knowledge of patient specific risk factors will improve Loraine Leriche N/A or DELETE if not current risk factor) Outcome: Progressing   Problem: Ischemic Stroke/TIA Tissue Perfusion: Goal: Complications of ischemic stroke/TIA will be minimized Outcome: Progressing   Problem: Coping: Goal: Will verbalize positive feelings about self Outcome: Progressing Goal: Will identify appropriate support needs Outcome: Progressing   Problem: Health Behavior/Discharge Planning: Goal: Ability to manage health-related needs will improve Outcome: Progressing Goal: Goals will be collaboratively established with patient/family Outcome: Progressing   Problem: Self-Care: Goal: Ability to participate in self-care as condition permits will improve Outcome: Progressing Goal: Verbalization of feelings and concerns over difficulty with self-care will improve Outcome: Progressing Goal: Ability to communicate needs accurately will improve Outcome: Progressing   Problem: Nutrition: Goal: Risk of aspiration will decrease Outcome: Progressing Goal: Dietary intake will improve Outcome: Progressing   Problem: Education: Goal: Understanding of CV disease, CV risk reduction, and recovery process will improve Outcome: Progressing Goal: Individualized Educational Video(s) Outcome: Progressing   Problem: Activity: Goal: Ability to return to baseline activity level will improve Outcome: Progressing   Problem: Cardiovascular: Goal: Ability to achieve and maintain adequate cardiovascular perfusion will improve Outcome: Progressing Goal: Vascular access site(s) Level 0-1 will be maintained Outcome: Progressing   Problem: Health Behavior/Discharge Planning: Goal: Ability to safely manage health-related needs after  discharge will improve Outcome: Progressing   Problem: Education: Goal: Knowledge of General Education information will improve Description: Including pain rating scale, medication(s)/side effects and non-pharmacologic comfort measures Outcome: Progressing   Problem: Health Behavior/Discharge Planning: Goal: Ability to manage health-related needs will improve Outcome: Progressing   Problem: Clinical Measurements: Goal: Ability to maintain clinical measurements within normal limits will improve Outcome: Progressing Goal: Will remain free from infection Outcome: Progressing Goal: Diagnostic test results will improve Outcome: Progressing Goal: Respiratory complications will improve Outcome: Progressing Goal: Cardiovascular complication will be avoided Outcome: Progressing   Problem: Activity: Goal: Risk for activity intolerance will decrease Outcome: Progressing   Problem: Nutrition: Goal: Adequate nutrition will be maintained Outcome: Progressing   Problem: Coping: Goal: Level of anxiety will decrease Outcome: Progressing   Problem: Elimination: Goal: Will not experience complications related to bowel motility Outcome: Progressing Goal: Will not experience complications related to urinary retention Outcome: Progressing   Problem: Pain Management: Goal: General experience of comfort will improve Outcome: Progressing   Problem: Safety: Goal: Ability to remain free from injury will improve Outcome: Progressing   Problem: Skin Integrity: Goal: Risk for impaired skin integrity will decrease Outcome: Progressing

## 2023-02-05 NOTE — Progress Notes (Signed)
Occupational Therapy Treatment Patient Details Name: Grant Berry MRN: [id number] DOB: 1945-04-15 Today's Date: 02/05/2023   History of present illness Pt is a 77 y.o. male who presented 01/31/23 s/p multiple falls.  MRI showed left MCA/PCA infarct, chronic right PCA infarct.  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 S/p bilat common carotid arteriograms R CFA approach, complete revascularization of L ICA supraclinoid segment, treated intracranial arteriosclerotic related stenosis, angioplasty of high grade L ICA proximal stenosis 12/8. PMH: L MCA CVA, HLD, HTN   OT comments  Pt in bed upon therapy arrival. Refused any out of bed activity with OT although did agree to have vision assessed while in bed. Pt verbalizes increased blurry vision in both eyes. Initially diplopia was present although that has resolved. Positive light sensitivity. Red bruising noted around right eye remains with gauze dressing. Pt states that he wears reading glasses although they are not with him in hospital. Increased difficulty assessing visual tracking. Pt was unable to demonstrate any visual tracking even when provided with verbal and visual cues. Pt states that he understood direction although remained looking straight ahead when finger moved into the right or left visual field. Discharge recommendation updated due to not having 24/7 supervision at discharge. Patient will benefit from continued inpatient follow up therapy, <3 hours/day.       If plan is discharge home, recommend the following:  Two people to help with walking and/or transfers;A lot of help with bathing/dressing/bathroom;Assistance with feeding;Direct supervision/assist for medications management;Direct supervision/assist for financial management;Assist for transportation;Help with stairs or ramp for entrance;Supervision due to cognitive status   Equipment  Recommendations  Other (comment) (defer to next venue of care)    Recommendations for Other Services      Precautions / Restrictions Precautions Precautions: Fall Precaution Comments: h/o multiple falls; visual impairment - new Restrictions Weight Bearing Restrictions Per Provider Order: No       Mobility Bed Mobility Overal bed mobility:  (Pt refused bed mobility)     Transfers Overall transfer level:  (Pt refused OOB activity)              ADL either performed or assessed with clinical judgement     Vision Baseline Vision/History: 1 Wears glasses Ability to See in Adequate Light: 0 Adequate Patient Visual Report: Blurring of vision Vision Assessment?: Yes Eye Alignment: Within Functional Limits Ocular Range of Motion: Restricted on the right Alignment/Gaze Preference: Within Defined Limits Tracking/Visual Pursuits: Unable to hold eye position out of midline (Able to rotate neck all the way to the right and left but when asked to track finger with eyes, he was unable and remained looking straight ahead using peripheral vision to confirm that he could see or not see.) Saccades: Additional eye shifts occurred during testing;Other (comment) (Unable restricted looking right. Only able to look left then center midline and back left.) Convergence: Within functional limits Visual Fields: Right visual field deficit;Left visual field deficit (Right visual field deficits greater than left visual field.) Depth Perception: Undershoots;Overshoots Additional Comments: Unable to assess visual acuity to read large print. Pt starts that it's too blurry.                           Pertinent Vitals/ Pain       Pain Assessment Pain Assessment: Faces Faces Pain Scale: No hurt Pain Location: reports that he is in pain  although did not verbalize where, what number, or indicate any pain during session. Pain Intervention(s): Monitored during session         Frequency  Min  1X/week        Progress Toward Goals  OT Goals(current goals can now be found in the care plan section)     Acute Rehab OT Goals Patient Stated Goal: pt state's, "I'm tired and I hurt."         AM-PAC OT "6 Clicks" Daily Activity     Outcome Measure   Help from another person eating meals?: A Lot Help from another person taking care of personal grooming?: A Lot Help from another person toileting, which includes using toliet, bedpan, or urinal?: Total Help from another person bathing (including washing, rinsing, drying)?: Total Help from another person to put on and taking off regular upper body clothing?: Total Help from another person to put on and taking off regular lower body clothing?: Total 6 Click Score: 8    End of Session    OT Visit Diagnosis: Unsteadiness on feet (R26.81);Repeated falls (R29.6);Muscle weakness (generalized) (M62.81);History of falling (Z91.81);Ataxia, unspecified (R27.0);Other symptoms and signs involving cognitive function   Activity Tolerance Patient limited by fatigue   Patient Left in bed;with call bell/phone within reach;with nursing/sitter in room;with bed alarm set   Nurse Communication          Time: [phone] OT Time Calculation (min): 16 min  Charges: OT General Charges $OT Visit: 1 Visit OT Treatments $Therapeutic Activity: 8-22 mins  Grant Berry, OTR/L,CBIS  Supplemental OT - [facility] and [facility] Secure Chat Preferred    Grant Berry, Grant Berry March 02/05/2023, 4:15 PM

## 2023-02-06 ENCOUNTER — Inpatient Hospital Stay (HOSPITAL_COMMUNITY): Payer: Medicare Other

## 2023-02-06 DIAGNOSIS — I651 Occlusion and stenosis of basilar artery: Secondary | ICD-10-CM

## 2023-02-06 DIAGNOSIS — I639 Cerebral infarction, unspecified: Secondary | ICD-10-CM

## 2023-02-06 LAB — BASIC METABOLIC PANEL
Anion gap: 9 (ref 5–15)
BUN: 13 mg/dL (ref 8–23)
CO2: 25 mmol/L (ref 22–32)
Calcium: 8.7 mg/dL — ABNORMAL LOW (ref 8.9–10.3)
Chloride: 101 mmol/L (ref 98–111)
Creatinine, Ser: 1.05 mg/dL (ref 0.61–1.24)
GFR, Estimated: >60 mL/min (ref >60)
Glucose, Bld: 98 mg/dL (ref 70–99)
Potassium: 3.9 mmol/L (ref 3.5–5.1)
Sodium: 135 mmol/L (ref 135–145)

## 2023-02-06 LAB — CBC
HCT: 27.1 % — ABNORMAL LOW (ref 39.0–52.0)
Hemoglobin: 9.2 g/dL — ABNORMAL LOW (ref 13.0–17.0)
MCH: 30.2 pg (ref 26.0–34.0)
MCHC: 33.9 g/dL (ref 30.0–36.0)
MCV: 88.9 fL (ref 80.0–100.0)
Platelets: 208 10*3/uL (ref 150–400)
RBC: 3.05 MIL/uL — ABNORMAL LOW (ref 4.22–5.81)
RDW: 13.9 % (ref 11.5–15.5)
WBC: 6.1 10*3/uL (ref 4.0–10.5)
nRBC: 0 % (ref 0.0–0.2)

## 2023-02-06 NOTE — Plan of Care (Signed)
   Problem: Education: Goal: Knowledge of disease or condition will improve Outcome: Progressing Goal: Knowledge of secondary prevention will improve (MUST DOCUMENT ALL) Outcome: Progressing Goal: Knowledge of patient specific risk factors will improve Loraine Leriche N/A or DELETE if not current risk factor) Outcome: Progressing   Problem: Ischemic Stroke/TIA Tissue Perfusion: Goal: Complications of ischemic stroke/TIA will be minimized Outcome: Progressing   Problem: Coping: Goal: Will verbalize positive feelings about self Outcome: Progressing Goal: Will identify appropriate support needs Outcome: Progressing   Problem: Health Behavior/Discharge Planning: Goal: Ability to manage health-related needs will improve Outcome: Progressing Goal: Goals will be collaboratively established with patient/family Outcome: Progressing   Problem: Self-Care: Goal: Ability to participate in self-care as condition permits will improve Outcome: Progressing Goal: Verbalization of feelings and concerns over difficulty with self-care will improve Outcome: Progressing Goal: Ability to communicate needs accurately will improve Outcome: Progressing   Problem: Nutrition: Goal: Risk of aspiration will decrease Outcome: Progressing Goal: Dietary intake will improve Outcome: Progressing   Problem: Education: Goal: Understanding of CV disease, CV risk reduction, and recovery process will improve Outcome: Progressing Goal: Individualized Educational Video(s) Outcome: Progressing   Problem: Activity: Goal: Ability to return to baseline activity level will improve Outcome: Progressing   Problem: Cardiovascular: Goal: Ability to achieve and maintain adequate cardiovascular perfusion will improve Outcome: Progressing Goal: Vascular access site(s) Level 0-1 will be maintained Outcome: Progressing   Problem: Health Behavior/Discharge Planning: Goal: Ability to safely manage health-related needs after  discharge will improve Outcome: Progressing   Problem: Education: Goal: Knowledge of General Education information will improve Description: Including pain rating scale, medication(s)/side effects and non-pharmacologic comfort measures Outcome: Progressing   Problem: Health Behavior/Discharge Planning: Goal: Ability to manage health-related needs will improve Outcome: Progressing   Problem: Clinical Measurements: Goal: Ability to maintain clinical measurements within normal limits will improve Outcome: Progressing Goal: Will remain free from infection Outcome: Progressing Goal: Diagnostic test results will improve Outcome: Progressing Goal: Respiratory complications will improve Outcome: Progressing Goal: Cardiovascular complication will be avoided Outcome: Progressing   Problem: Activity: Goal: Risk for activity intolerance will decrease Outcome: Progressing   Problem: Nutrition: Goal: Adequate nutrition will be maintained Outcome: Progressing   Problem: Coping: Goal: Level of anxiety will decrease Outcome: Progressing   Problem: Elimination: Goal: Will not experience complications related to bowel motility Outcome: Progressing Goal: Will not experience complications related to urinary retention Outcome: Progressing   Problem: Pain Management: Goal: General experience of comfort will improve Outcome: Progressing   Problem: Safety: Goal: Ability to remain free from injury will improve Outcome: Progressing   Problem: Skin Integrity: Goal: Risk for impaired skin integrity will decrease Outcome: Progressing

## 2023-02-06 NOTE — Progress Notes (Signed)
At 0505 RN entered patient room to attempt to reposition patient and seen patient had bloody drainage around foley catheter. Upon further assessment foley catheter appeared slightly dislodged. Patient reported no pain or discomfort. At 0515 Caryl Pina MD paged about foley catheter. Provider paged back at 0525 and placed order for new foley insertion. At 0530 new 61f foley placed with no complications.

## 2023-02-06 NOTE — Progress Notes (Addendum)
0915: Patient off the unit for CT 0945: Patient back to the unit

## 2023-02-06 NOTE — Progress Notes (Addendum)
STROKE TEAM PROGRESS NOTE   SUBJECTIVE (INTERVAL HISTORY) No family is at the bedside.  Wants tv on. Repeat CT stable.   Doing well. On DAPT already.   OBJECTIVE Temp:  [98 F (36.7 C)-99 F (37.2 C)] 98 F (36.7 C) (12/14 1114) Pulse Rate:  [65-85] 76 (12/14 1114) Cardiac Rhythm: Heart block;Bundle branch block (12/14 0814) Resp:  [16-18] 17 (12/14 1114) BP: (104-138)/(50-84) 105/69 (12/14 1114) SpO2:  [96 %-99 %] 99 % (12/14 1114)  Recent Labs  Lab 01/31/23 0035  GLUCAP 93   Recent Labs  Lab 02/02/23 0455 02/03/23 0604 02/04/23 0727 02/05/23 0624 02/06/23 0557  NA 136 136 136 138 135  K 3.4* 3.3* 3.5 3.5 3.9  CL 101 102 101 104 101  CO2 24 24 27 24 25   GLUCOSE 101* 104* 103* 98 98  BUN 14 12 16 14 13   CREATININE 1.07 1.21 1.21 1.09 1.05  CALCIUM 9.4 9.2 8.7* 8.8* 8.7*   Recent Labs  Lab 01/31/23 0040  AST 27  ALT 15  ALKPHOS 55  BILITOT 0.5  PROT 7.2  ALBUMIN 4.0   Recent Labs  Lab 01/31/23 0040 01/31/23 0042 02/01/23 0444 02/02/23 0455 02/03/23 0604 02/04/23 0727 02/05/23 0624 02/06/23 0557  WBC 6.7   < > 6.3 8.4 8.8 7.5 5.6 6.1  NEUTROABS 4.5  --  4.5  --   --   --   --   --   HGB 12.5*   < > 9.3* 11.0* 10.5* 10.3* 9.5* 9.2*  HCT 37.6*   < > 29.1* 32.5* 30.9* 30.9* 28.9* 27.1*  MCV 90.6   < > 92.7 88.3 88.3 90.1 89.2 88.9  PLT 192   < > 153 165 168 188 190 208   < > = values in this interval not displayed.        Component Value Date/Time   CHOL 235 (H) 01/31/2023 0454   CHOL 202 (H) 07/10/2022 1012   CHOL 219 (H) 06/17/2012 1620   TRIG 47 01/31/2023 0454   TRIG 215 (H) 06/17/2012 1620   HDL 59 01/31/2023 0454   HDL 48 07/10/2022 1012   HDL 34 (L) 06/17/2012 1620   CHOLHDL 4.0 01/31/2023 0454   VLDL 9 01/31/2023 0454   LDLCALC 167 (H) 01/31/2023 0454   LDLCALC 134 (H) 07/10/2022 1012   LDLCALC 142 (H) 06/17/2012 1620   Lab Results  Component Value Date   HGBA1C 5.1 01/31/2023      Component Value Date/Time   LABOPIA NONE  DETECTED 01/31/2023 0039   COCAINSCRNUR NONE DETECTED 01/31/2023 0039   LABBENZ NONE DETECTED 01/31/2023 0039   AMPHETMU NONE DETECTED 01/31/2023 0039   THCU NONE DETECTED 01/31/2023 0039   LABBARB NONE DETECTED 01/31/2023 0039    Recent Labs  Lab 01/31/23 0040  ETH <10    I have personally reviewed the radiological images below and agree with the radiology interpretations.  CT HEAD WO CONTRAST ( ) Result Date: 02/06/2023 CLINICAL DATA:  77 year old male code stroke presentation on 01/31/2023 with multiple intracranial occlusions. Status post endovascular reperfusion, stenting. Left lentiform hemorrhagic transformation in the left basal ganglia and occipital lobe. EXAM: CT HEAD WITHOUT CONTRAST TECHNIQUE: Contiguous axial images were obtained from the base of the skull through the vertex without intravenous contrast. RADIATION DOSE REDUCTION: This exam was performed according to the departmental dose-optimization program which includes automated exposure control, adjustment of the mA and/or kV according to patient size and/or use of iterative reconstruction technique. COMPARISON:  Brain MRI  02/04/2023. FINDINGS: Brain: Oval left lentiform hemorrhage is 25 x 17 by 32 mm (AP by transverse by CC) for estimated volume of 7 mL and appears mildly larger since the MRI on 02/04/2023. Regional hypodense edema. Oval left occipital pole cortical and subcortical hyperdense hemorrhage superimposed on confluent cytotoxic edema there is 19 x 42 x 14 mm (AP by transverse by CC) for an estimated blood volume of 6 mL, stable since 02/04/2023. No intraventricular blood. Trace additional left hemisphere subarachnoid hemorrhage suspected such as on sagittal image 43 left superior frontal gyrus. No midline shift or significant intracranial mass effect. Mild mass effect on the left lateral ventricle is stable. No ventriculomegaly. Underlying scattered chronic cerebral encephalomalacia including in the posterior fossa.  No new cortically based infarct identified. Vascular: Distal left ICA stent. Calcified atherosclerosis at the skull base. Skull: Stable, intact. Sinuses/Orbits: Visualized paranasal sinuses and mastoids are stable and well aerated. Other: No acute orbit or scalp soft tissue finding. IMPRESSION: 1. Left lentiform hemorrhage estimated at 7 mL appears slightly larger since 02/04/2023. Hemorrhagic transformation of the left PCA infarct estimated at 6 mL is stable. Trace left hemisphere subarachnoid hemorrhage suspected. 2. No midline shift or significant intracranial mass effect. No new intracranial abnormality. Multifocal chronic ischemic disease. Electronically Signed   By: Odessa Fleming M.D.   On: 02/06/2023 09:44   MR BRAIN WO CONTRAST Addendum Date: 02/04/2023 ADDENDUM REPORT: 02/04/2023 21:09 ADDENDUM: Results were communicated by telephone at the time of interpretation on 02/04/2023 at 9 p.m. to provider Dr. Otelia Limes, who verbally acknowledged these results. Electronically Signed   By: Rise Mu M.D.   On: 02/04/2023 21:09   Result Date: 02/04/2023 CLINICAL DATA:  Follow-up examination for stroke. EXAM: MRI HEAD WITHOUT CONTRAST TECHNIQUE: Multiplanar, multiecho pulse sequences of the brain and surrounding structures were obtained without intravenous contrast. COMPARISON:  Previous MRI from 02/01/2023 and CT from 01/31/2023 FINDINGS: Brain: Atrophy with chronic microvascular ischemic disease. Evolving late subacute right PCA distribution infarct involving the right occipital lobe. Multifocal chronic infarcts involving the left greater than right cerebellum. Few scattered remote lacunar infarcts about the basal ganglia and pons. Continued interval evolution of subacute left PCA distribution infarct, similar in size as compared to previous. Few additional patchy foci of all vein subacute ischemia noted superiorly within the subcortical posterior left frontal parietal region, also similar. There has been  evidence for hemorrhagic transformation with a hematoma measuring 2.7 x 1.5 cm now seen at the left occipital lobe (series 14, image 22). Additional new acute intraparenchymal hemorrhage at the posterior left basal ganglia measures approximately 2.2 x 1.9 cm (series 14, image 32). No significant regional mass effect. No intraventricular extension. Additional small volume subarachnoid hemorrhage at the anterior left frontal convexity, slightly less conspicuous as compared to prior. Additional scattered chronic micro hemorrhages noted, stable. No other new or interval infarction elsewhere. 3.7 cm benign arachnoid cyst at the left frontal vertex again noted. No other mass lesion or midline shift. No hydrocephalus. No extra-axial fluid collection. Vascular: Loss of normal flow void within the left vertebral artery, stable. Heterogeneous but preserved flow voids within the remaining posterior circulation. Normal flow voids seen within the anterior circulation. Skull and upper cervical spine: Craniocervical junction within normal limits. Bone marrow signal intensity normal. No scalp soft tissue abnormality. Sinuses/Orbits: Prior bilateral ocular lens replacement. Paranasal sinuses remain largely clear. No significant mastoid effusion. Other: 9 mm T2 hyperintense lesion within the right parotid gland (series 15, image 21), indeterminate. IMPRESSION: 1. Continued  interval evolution of subacute left PCA distribution infarct, similar in size as compared to previous. Interval hemorrhagic transformation with a 2.7 x 1.5 cm hematoma at the left occipital lobe. Additional new acute intraparenchymal hemorrhage at the posterior left basal ganglia measuring 2.2 x 1.9 cm. No significant regional mass effect. No intraventricular extension. 2. Additional small volume subarachnoid hemorrhage at the anterior left frontal convexity, slightly less conspicuous as compared to prior. 3. Evolving late subacute right PCA distribution infarct,  stable. 4. Underlying atrophy with chronic microvascular ischemic disease, with multiple chronic infarcts as above. 5. 9 mm T2 hyperintense lesion within the right parotid gland, indeterminate. Correlation with nonemergent outpatient ultrasound suggested for further evaluation. The covering neurohospitalist has been paged regarding these findings, currently awaiting a call back. Results will be conveyed as soon as possible. Electronically Signed: By: Rise Mu M.D. On: 02/04/2023 20:59   IR PERCUTANEOUS ART THROMBECTOMY/INFUSION INTRACRANIAL INC DIAG ANGIO Result Date: 02/02/2023 INDICATION: History of repeated falls. Patient with new onset expressive aphasia and possible right sided weakness. 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 Also noted mid basilar artery occlusion. EXAM: 1. EMERGENT LARGE VESSEL OCCLUSION THROMBOLYSIS (anterior CIRCULATION) COMPARISON:  Recent CT angiogram of the head and neck, and MRI of the brain. MEDICATIONS: Ancef 2 g IV was administered within 1 hour of the procedure. ANESTHESIA/SEDATION: General anesthesia. CONTRAST:  Omnipaque 300 approximately 110 mL. FLUOROSCOPY TIME:  Fluoroscopy Time: 31 minutes 42 seconds (1110 mGy). COMPLICATIONS: None immediate. TECHNIQUE: Following a full explanation of the procedure along with the potential associated complications, an informed witnessed consent was obtained. The risks of intracranial hemorrhage of 10%, worsening neurological deficit, ventilator dependency, death and inability to revascularize were all reviewed in detail with the patient. The patient was then put under general anesthesia by the Department of Anesthesiology at [facility]. The right groin was prepped and draped in the usual sterile fashion. Thereafter using modified Seldinger technique, transfemoral access into the right common femoral artery was obtained without difficulty.  Over an 0.035 inch guidewire an 8 French 25 cm Pinnacle sheath was inserted. Through this, and also over an 0.035 inch guidewire a combination of a 125 cm 6 [place] Berenstein support catheter inside of an 087 95 cm balloon guide catheter was advanced to the aortic arch region and advanced to the distal left common carotid artery. The guidewire, and the support catheter were removed. Good aspiration was obtained from the hub of the balloon guide catheter. A control arteriogram was then performed centered extra cranially and intracranially. FINDINGS: The left common carotid arteriogram demonstrates the left external carotid artery and its major branches to be widely patent. The left internal carotid artery just distal to the bulb has a focal severe stenosis. 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 PROCEDURE: Through the balloon guide catheter in the distal left ICA, and over an 014 inch 300 cm exchange micro guidewire positioned in the horizontal petrous segment, control angioplasty of the proximal left internal carotid stenosis was performed with a 4 mm x 30 cm Viatrac 14 angioplasty balloon catheter which had been prepped with 50% contrast and 50% heparinized saline infusion. Using the rapid exchange technique, the balloon was positioned with its markers adequate distant from the site of severe stenosis. A control  inflation was then performed using a micro inflation syringe device via micro tubing to approximately 4 mm where it was maintained for approximately a minute and a half. Thereafter, following deflation of the balloon and removal a control arteriogram performed through the balloon guide catheter demonstrated now improved caliber of the angioplastied segment. At this time a 071 Zoom aspiration catheter was advanced in combination with an 021 160 cm Trevo  ProVue microcatheter over the exchange micro guidewire to the proximal petrous segment. The 300 cm 014 exchange micro guidewire was then exchanged for an 018 inch micro guidewire with a moderate J configuration distally. The micro guidewire with the microcatheter was then gently advanced without difficulty through the occluded supraclinoid left ICA to the middle cerebral artery inferior division M2 segment followed by the microcatheter. The guidewire was removed. Good aspiration obtained from the hub of the microcatheter. A gentle control arteriogram performed through this demonstrated safe positioning of the tip of the microcatheter. A 4 mm x 40 mm Solitaire X retrieval device was then deployed with the proximal portion in the supraclinoid left ICA. At this time the Zoom aspiration catheter was advanced without difficulty to the mid M1 segment. Control aspiration was then performed for approximately a minute and a half at the hub of the Zoom aspiration catheter, and with a 20 mL syringe at the hub of the balloon guide catheter for approximately a minute and a half. The combination was then retrieved and removed. Following reversal of flow arrest in the left internal carotid artery a control arteriogram performed demonstrated complete revascularization of the supraclinoid left ICA with opacification of the left middle cerebral artery achieving a TICI 3 revascularization. Also noted was opacification of the proximal left A1 segment with focal areas of severe stenosis due to arteriosclerosis. Bilateral PCOMs demonstrated opacification into the left posterior cerebral artery P1 segment. Underlying intracranial arteriosclerosis with resulting severe stenosis was noted just proximal to the origins of the posterior communicating arteries. Through the balloon guide catheter in the distal left ICA, an aspiration 5 French 115 cm guide catheter was then advanced to the proximal cavernous segment over an 035 inch guidewire.  Through this, 160 cm Trevo ProVue microcatheter was advanced over an 018 inch micro guidewire. The microcatheter and the micro guidewire were advanced to the distal M2 M3 regions of the inferior division followed by the microcatheter. The guidewire was removed. Good aspiration obtained from the hub of the microcatheter. This in turn was exchanged for an 014 inch 300 cm exchange micro guidewire with a moderate J configuration at its distal end. A control arteriogram performed demonstrated safe positioning of the tip of the micro guidewire. Measurements were then performed of the left ICA supraclinoid segment just proximal to the bifurcation and just distal to the origin of the ophthalmic artery. A 3 mm x 12 mm Onyx Frontier balloon mounted stent which had been prepped antegradely with 50% contrast and 50% heparinized saline infusion, and retrogradely with heparinized saline infusion, was advanced in a coaxial manner and with constant heparinized saline infusion and positioned without difficulty with the stent markers adequate distant from the area of severe stenosis. A control inflation was then performed using a micro inflation syringe device via micro tubing to just over 3 mm x 2 where it was maintained for approximately a minute and a half. Following balloon deflation and retrieval and removal, a control arteriogram performed through the 5 French intermediate catheter demonstrated significantly improved caliber of the supraclinoid left ICA with  a TICI 3 revascularization of the left MCA being persistent. Again demonstrated was the stenosed left anterior cerebral A1 segment. Over the exchange micro guidewire now in the proximal cavernous segment, a control arteriogram performed through the balloon guide catheter following removal of the intermediate catheter. Measurements were then performed of the left internal carotid artery proximally at the site of the significant stenosis and the distal left common carotid  artery. A 6/8 mm x 40 mm Xact stent delivery apparatus was then advanced in a coaxial manner and with constant heparinized saline infusion and positioned without difficulty with the adequate coverage distal to the angioplastied segment and into the distal left common carotid artery. The stent was then deployed in the usual manner without any difficulty. The delivery apparatus was removed. A control arteriogram performed through the balloon guide catheter demonstrated significantly improved caliber through the stented segment of the proximal left ICA. The waist at the site of the angioplasty was then treated with a 5 mm x 30 mm Viatrac angioplasty microcatheter which was advanced after its preparation as described above. Prior to the angioplasty, proximal flow arrest was initiated by inflating the balloon in the distal left common carotid artery, and with proximal aspiration during the angioplasty. Following the angioplasty, the balloon was retrieved and removed while aspiration was maintained as flow arrest was reversed. Control arteriograms were then performed through the balloon guide catheter over the next 10-20 minutes continued to demonstrate excellent flow through the centered in the proximal left ICA. Intracranially no change was seen in the TICI 3 revascularization of the left middle cerebral artery distribution. A 5 French diagnostic catheter was then advanced into the right common carotid artery over an 035 inch guidewire. The right common carotid artery injection demonstrated patency of the right internal carotid artery at the neck, and intracranially. The petrous, the cavernous and the supraclinoid right ICA demonstrate wide patency. The right middle cerebral artery and the right anterior cerebral artery demonstrate wide patency into the capillary and venous phases with a 50% stenosis of the right middle cerebral artery M1 segment. Cross-filling via the anterior communicating artery of the left anterior  cerebral A2 segment, and the left M1 segment was evident. An 8 French Angio-Seal closure device was deployed at the right groin puncture site for hemostasis. Distal pulses remained present unchanged in both feet at the end of the procedure. Flat panel CT of the brain demonstrated no evidence of intracranial hemorrhage. Patient was extubated in was beginning to respond to simple commands appropriately slightly weaker on the right side. Medications utilized during the procedure. 81 mg of aspirin and Plavix 15 mg via orogastric tube prior to angioplasty. The patient was also given half bolus dose of cangrelor followed by a 4 hour half dose infusion to be followed by CT of the brain. Patient was then transferred to the neuro ICU for post revascularization care. 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 Underlying a significant atherosclerotic related stenosis treated with a 3 mm x 12 mm Onyx Frontier balloon mounted stent with significantly improved caliber and flow. Status post stent assisted angioplasty of proximal left ICA stenosis with proximal flow arrest as described without event. PLAN: Follow-up in the clinic 4 weeks post discharge. Electronically Signed   By: Julieanne Cotton M.D.   On: 02/02/2023 07:58  IR CT Head Ltd Result Date: 02/02/2023 INDICATION: History of repeated falls. Patient with new onset expressive aphasia and possible right sided weakness. 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 Also noted mid basilar artery occlusion. EXAM: 1. EMERGENT LARGE VESSEL OCCLUSION THROMBOLYSIS (anterior CIRCULATION) COMPARISON:  Recent CT angiogram of the head and neck, and MRI of the brain. MEDICATIONS: Ancef 2 g IV was  administered within 1 hour of the procedure. ANESTHESIA/SEDATION: General anesthesia. CONTRAST:  Omnipaque 300 approximately 110 mL. FLUOROSCOPY TIME:  Fluoroscopy Time: 31 minutes 42 seconds (1110 mGy). COMPLICATIONS: None immediate. TECHNIQUE: Following a full explanation of the procedure along with the potential associated complications, an informed witnessed consent was obtained. The risks of intracranial hemorrhage of 10%, worsening neurological deficit, ventilator dependency, death and inability to revascularize were all reviewed in detail with the patient. The patient was then put under general anesthesia by the Department of Anesthesiology at [facility]. The right groin was prepped and draped in the usual sterile fashion. Thereafter using modified Seldinger technique, transfemoral access into the right common femoral artery was obtained without difficulty. Over an 0.035 inch guidewire an 8 French 25 cm Pinnacle sheath was inserted. Through this, and also over an 0.035 inch guidewire a combination of a 125 cm 6 [place] Berenstein support catheter inside of an 087 95 cm balloon guide catheter was advanced to the aortic arch region and advanced to the distal left common carotid artery. The guidewire, and the support catheter were removed. Good aspiration was obtained from the hub of the balloon guide catheter. A control arteriogram was then performed centered extra cranially and intracranially. FINDINGS: The left common carotid arteriogram demonstrates the left external carotid artery and its major branches to be widely patent. The left internal carotid artery just distal to the bulb has a focal severe stenosis. 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 PROCEDURE: Through the balloon guide catheter in the distal left ICA, and over an 014 inch 300 cm  exchange micro guidewire positioned in the horizontal petrous segment, control angioplasty of the proximal left internal carotid stenosis was performed with a 4 mm x 30 cm Viatrac 14 angioplasty balloon catheter which had been prepped with 50% contrast and 50% heparinized saline infusion. Using the rapid exchange technique, the balloon was positioned with its markers adequate distant from the site of severe stenosis. A control inflation was then performed using a micro inflation syringe device via micro tubing to approximately 4 mm where it was maintained for approximately a minute and a half. Thereafter, following deflation of the balloon and removal a control arteriogram performed through the balloon guide catheter demonstrated now improved caliber of the angioplastied segment. At this time a 071 Zoom aspiration catheter was advanced in combination with an 021 160 cm Trevo ProVue microcatheter over the exchange micro guidewire to the proximal petrous segment. The 300 cm 014 exchange micro guidewire was then exchanged for an 018 inch micro guidewire with a moderate J configuration distally. The micro guidewire with the microcatheter was then gently advanced without difficulty through the occluded supraclinoid left ICA to the middle cerebral artery inferior division M2 segment followed by the microcatheter. The guidewire was removed. Good aspiration obtained from the hub of the microcatheter. A gentle control arteriogram performed through  this demonstrated safe positioning of the tip of the microcatheter. A 4 mm x 40 mm Solitaire X retrieval device was then deployed with the proximal portion in the supraclinoid left ICA. At this time the Zoom aspiration catheter was advanced without difficulty to the mid M1 segment. Control aspiration was then performed for approximately a minute and a half at the hub of the Zoom aspiration catheter, and with a 20 mL syringe at the hub of the balloon guide catheter for approximately  a minute and a half. The combination was then retrieved and removed. Following reversal of flow arrest in the left internal carotid artery a control arteriogram performed demonstrated complete revascularization of the supraclinoid left ICA with opacification of the left middle cerebral artery achieving a TICI 3 revascularization. Also noted was opacification of the proximal left A1 segment with focal areas of severe stenosis due to arteriosclerosis. Bilateral PCOMs demonstrated opacification into the left posterior cerebral artery P1 segment. Underlying intracranial arteriosclerosis with resulting severe stenosis was noted just proximal to the origins of the posterior communicating arteries. Through the balloon guide catheter in the distal left ICA, an aspiration 5 French 115 cm guide catheter was then advanced to the proximal cavernous segment over an 035 inch guidewire. Through this, 160 cm Trevo ProVue microcatheter was advanced over an 018 inch micro guidewire. The microcatheter and the micro guidewire were advanced to the distal M2 M3 regions of the inferior division followed by the microcatheter. The guidewire was removed. Good aspiration obtained from the hub of the microcatheter. This in turn was exchanged for an 014 inch 300 cm exchange micro guidewire with a moderate J configuration at its distal end. A control arteriogram performed demonstrated safe positioning of the tip of the micro guidewire. Measurements were then performed of the left ICA supraclinoid segment just proximal to the bifurcation and just distal to the origin of the ophthalmic artery. A 3 mm x 12 mm Onyx Frontier balloon mounted stent which had been prepped antegradely with 50% contrast and 50% heparinized saline infusion, and retrogradely with heparinized saline infusion, was advanced in a coaxial manner and with constant heparinized saline infusion and positioned without difficulty with the stent markers adequate distant from the area  of severe stenosis. A control inflation was then performed using a micro inflation syringe device via micro tubing to just over 3 mm x 2 where it was maintained for approximately a minute and a half. Following balloon deflation and retrieval and removal, a control arteriogram performed through the 5 [place] intermediate catheter demonstrated significantly improved caliber of the supraclinoid left ICA with a TICI 3 revascularization of the left MCA being persistent. Again demonstrated was the stenosed left anterior cerebral A1 segment. Over the exchange micro guidewire now in the proximal cavernous segment, a control arteriogram performed through the balloon guide catheter following removal of the intermediate catheter. Measurements were then performed of the left internal carotid artery proximally at the site of the significant stenosis and the distal left common carotid artery. A 6/8 mm x 40 mm Xact stent delivery apparatus was then advanced in a coaxial manner and with constant heparinized saline infusion and positioned without difficulty with the adequate coverage distal to the angioplastied segment and into the distal left common carotid artery. The stent was then deployed in the usual manner without any difficulty. The delivery apparatus was removed. A control arteriogram performed through the balloon guide catheter demonstrated significantly improved caliber through the stented segment of the proximal left ICA. The  waist at the site of the angioplasty was then treated with a 5 mm x 30 mm Viatrac angioplasty microcatheter which was advanced after its preparation as described above. Prior to the angioplasty, proximal flow arrest was initiated by inflating the balloon in the distal left common carotid artery, and with proximal aspiration during the angioplasty. Following the angioplasty, the balloon was retrieved and removed while aspiration was maintained as flow arrest was reversed. Control arteriograms were  then performed through the balloon guide catheter over the next 10-20 minutes continued to demonstrate excellent flow through the centered in the proximal left ICA. Intracranially no change was seen in the TICI 3 revascularization of the left middle cerebral artery distribution. A 5 French diagnostic catheter was then advanced into the right common carotid artery over an 035 inch guidewire. The right common carotid artery injection demonstrated patency of the right internal carotid artery at the neck, and intracranially. The petrous, the cavernous and the supraclinoid right ICA demonstrate wide patency. The right middle cerebral artery and the right anterior cerebral artery demonstrate wide patency into the capillary and venous phases with a 50% stenosis of the right middle cerebral artery M1 segment. Cross-filling via the anterior communicating artery of the left anterior cerebral A2 segment, and the left M1 segment was evident. An 8 French Angio-Seal closure device was deployed at the right groin puncture site for hemostasis. Distal pulses remained present unchanged in both feet at the end of the procedure. Flat panel CT of the brain demonstrated no evidence of intracranial hemorrhage. Patient was extubated in was beginning to respond to simple commands appropriately slightly weaker on the right side. Medications utilized during the procedure. 81 mg of aspirin and Plavix 15 mg via orogastric tube prior to angioplasty. The patient was also given half bolus dose of cangrelor followed by a 4 hour half dose infusion to be followed by CT of the brain. Patient was then transferred to the neuro ICU for post revascularization care. 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 Underlying a significant atherosclerotic  related stenosis treated with a 3 mm x 12 mm Onyx Frontier balloon mounted stent with significantly improved caliber and flow. Status post stent assisted angioplasty of proximal left ICA stenosis with proximal flow arrest as described without event. PLAN: Follow-up in the clinic 4 weeks post discharge. Electronically Signed   By: Julieanne Cotton M.D.   On: 02/02/2023 07:58   IR INTRAVSC STENT CERV CAROTID W/O EMB-PROT MOD SED Result Date: 02/02/2023 INDICATION: History of repeated falls. Patient with new onset expressive aphasia and possible right sided weakness. 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 Also noted mid basilar artery occlusion. EXAM: 1. EMERGENT LARGE VESSEL OCCLUSION THROMBOLYSIS (anterior CIRCULATION) COMPARISON:  Recent CT angiogram of the head and neck, and MRI of the brain. MEDICATIONS: Ancef 2 g IV was administered within 1 hour of the procedure. ANESTHESIA/SEDATION: General anesthesia. CONTRAST:  Omnipaque 300 approximately 110 mL. FLUOROSCOPY TIME:  Fluoroscopy Time: 31 minutes 42 seconds (1110 mGy). COMPLICATIONS: None immediate. TECHNIQUE: Following a full explanation of the procedure along with the potential associated complications, an informed witnessed consent was obtained. The risks of intracranial hemorrhage of 10%, worsening neurological deficit, ventilator dependency, death and inability to revascularize were all reviewed  in detail with the patient. The patient was then put under general anesthesia by the Department of Anesthesiology at [facility]. The right groin was prepped and draped in the usual sterile fashion. Thereafter using modified Seldinger technique, transfemoral access into the right common femoral artery was obtained without difficulty. Over an 0.035 inch guidewire an 8 French 25 cm Pinnacle sheath was inserted. Through this, and also over an 0.035 inch guidewire a  combination of a 125 cm 6 [place] Berenstein support catheter inside of an 087 95 cm balloon guide catheter was advanced to the aortic arch region and advanced to the distal left common carotid artery. The guidewire, and the support catheter were removed. Good aspiration was obtained from the hub of the balloon guide catheter. A control arteriogram was then performed centered extra cranially and intracranially. FINDINGS: The left common carotid arteriogram demonstrates the left external carotid artery and its major branches to be widely patent. The left internal carotid artery just distal to the bulb has a focal severe stenosis. 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 PROCEDURE: Through the balloon guide catheter in the distal left ICA, and over an 014 inch 300 cm exchange micro guidewire positioned in the horizontal petrous segment, control angioplasty of the proximal left internal carotid stenosis was performed with a 4 mm x 30 cm Viatrac 14 angioplasty balloon catheter which had been prepped with 50% contrast and 50% heparinized saline infusion. Using the rapid exchange technique, the balloon was positioned with its markers adequate distant from the site of severe stenosis. A control inflation was then performed using a micro inflation syringe device via micro tubing to approximately 4 mm where it was maintained for approximately a minute and a half. Thereafter, following deflation of the balloon and removal a control arteriogram performed through the balloon guide catheter demonstrated now improved caliber of the angioplastied segment. At this time a 071 Zoom aspiration catheter was advanced in combination with an 021 160 cm Trevo ProVue microcatheter over the exchange micro guidewire to the proximal petrous segment. The 300 cm 014 exchange micro guidewire was  then exchanged for an 018 inch micro guidewire with a moderate J configuration distally. The micro guidewire with the microcatheter was then gently advanced without difficulty through the occluded supraclinoid left ICA to the middle cerebral artery inferior division M2 segment followed by the microcatheter. The guidewire was removed. Good aspiration obtained from the hub of the microcatheter. A gentle control arteriogram performed through this demonstrated safe positioning of the tip of the microcatheter. A 4 mm x 40 mm Solitaire X retrieval device was then deployed with the proximal portion in the supraclinoid left ICA. At this time the Zoom aspiration catheter was advanced without difficulty to the mid M1 segment. Control aspiration was then performed for approximately a minute and a half at the hub of the Zoom aspiration catheter, and with a 20 mL syringe at the hub of the balloon guide catheter for approximately a minute and a half. The combination was then retrieved and removed. Following reversal of flow arrest in the left internal carotid artery a control arteriogram performed demonstrated complete revascularization of the supraclinoid left ICA with opacification of the left middle cerebral artery achieving a TICI 3 revascularization. Also noted was opacification of the proximal left A1 segment with focal areas of severe stenosis  due to arteriosclerosis. Bilateral PCOMs demonstrated opacification into the left posterior cerebral artery P1 segment. Underlying intracranial arteriosclerosis with resulting severe stenosis was noted just proximal to the origins of the posterior communicating arteries. Through the balloon guide catheter in the distal left ICA, an aspiration 5 French 115 cm guide catheter was then advanced to the proximal cavernous segment over an 035 inch guidewire. Through this, 160 cm Trevo ProVue microcatheter was advanced over an 018 inch micro guidewire. The microcatheter and the micro  guidewire were advanced to the distal M2 M3 regions of the inferior division followed by the microcatheter. The guidewire was removed. Good aspiration obtained from the hub of the microcatheter. This in turn was exchanged for an 014 inch 300 cm exchange micro guidewire with a moderate J configuration at its distal end. A control arteriogram performed demonstrated safe positioning of the tip of the micro guidewire. Measurements were then performed of the left ICA supraclinoid segment just proximal to the bifurcation and just distal to the origin of the ophthalmic artery. A 3 mm x 12 mm Onyx Frontier balloon mounted stent which had been prepped antegradely with 50% contrast and 50% heparinized saline infusion, and retrogradely with heparinized saline infusion, was advanced in a coaxial manner and with constant heparinized saline infusion and positioned without difficulty with the stent markers adequate distant from the area of severe stenosis. A control inflation was then performed using a micro inflation syringe device via micro tubing to just over 3 mm x 2 where it was maintained for approximately a minute and a half. Following balloon deflation and retrieval and removal, a control arteriogram performed through the 5 [place] intermediate catheter demonstrated significantly improved caliber of the supraclinoid left ICA with a TICI 3 revascularization of the left MCA being persistent. Again demonstrated was the stenosed left anterior cerebral A1 segment. Over the exchange micro guidewire now in the proximal cavernous segment, a control arteriogram performed through the balloon guide catheter following removal of the intermediate catheter. Measurements were then performed of the left internal carotid artery proximally at the site of the significant stenosis and the distal left common carotid artery. A 6/8 mm x 40 mm Xact stent delivery apparatus was then advanced in a coaxial manner and with constant heparinized saline  infusion and positioned without difficulty with the adequate coverage distal to the angioplastied segment and into the distal left common carotid artery. The stent was then deployed in the usual manner without any difficulty. The delivery apparatus was removed. A control arteriogram performed through the balloon guide catheter demonstrated significantly improved caliber through the stented segment of the proximal left ICA. The waist at the site of the angioplasty was then treated with a 5 mm x 30 mm Viatrac angioplasty microcatheter which was advanced after its preparation as described above. Prior to the angioplasty, proximal flow arrest was initiated by inflating the balloon in the distal left common carotid artery, and with proximal aspiration during the angioplasty. Following the angioplasty, the balloon was retrieved and removed while aspiration was maintained as flow arrest was reversed. Control arteriograms were then performed through the balloon guide catheter over the next 10-20 minutes continued to demonstrate excellent flow through the centered in the proximal left ICA. Intracranially no change was seen in the TICI 3 revascularization of the left middle cerebral artery distribution. A 5 French diagnostic catheter was then advanced into the right common carotid artery over an 035 inch guidewire. The right common carotid artery injection demonstrated patency of the  right internal carotid artery at the neck, and intracranially. The petrous, the cavernous and the supraclinoid right ICA demonstrate wide patency. The right middle cerebral artery and the right anterior cerebral artery demonstrate wide patency into the capillary and venous phases with a 50% stenosis of the right middle cerebral artery M1 segment. Cross-filling via the anterior communicating artery of the left anterior cerebral A2 segment, and the left M1 segment was evident. An 8 French Angio-Seal closure device was deployed at the right groin  puncture site for hemostasis. Distal pulses remained present unchanged in both feet at the end of the procedure. Flat panel CT of the brain demonstrated no evidence of intracranial hemorrhage. Patient was extubated in was beginning to respond to simple commands appropriately slightly weaker on the right side. Medications utilized during the procedure. 81 mg of aspirin and Plavix 15 mg via orogastric tube prior to angioplasty. The patient was also given half bolus dose of cangrelor followed by a 4 hour half dose infusion to be followed by CT of the brain. Patient was then transferred to the neuro ICU for post revascularization care. 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 Underlying a significant atherosclerotic related stenosis treated with a 3 mm x 12 mm Onyx Frontier balloon mounted stent with significantly improved caliber and flow. Status post stent assisted angioplasty of proximal left ICA stenosis with proximal flow arrest as described without event. PLAN: Follow-up in the clinic 4 weeks post discharge. Electronically Signed   By: Julieanne Cotton M.D.   On: 02/02/2023 07:58   IR Intra Cran Stent Result Date: 02/02/2023 INDICATION: History of repeated falls. Patient with new onset expressive aphasia and possible right sided weakness. 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 Also noted mid basilar artery occlusion. EXAM: 1. EMERGENT LARGE VESSEL OCCLUSION THROMBOLYSIS (anterior CIRCULATION) COMPARISON:  Recent CT angiogram of the head and neck, and MRI of the brain. MEDICATIONS: Ancef 2 g IV was administered within 1 hour of the procedure. ANESTHESIA/SEDATION: General anesthesia. CONTRAST:  Omnipaque 300 approximately  110 mL. FLUOROSCOPY TIME:  Fluoroscopy Time: 31 minutes 42 seconds (1110 mGy). COMPLICATIONS: None immediate. TECHNIQUE: Following a full explanation of the procedure along with the potential associated complications, an informed witnessed consent was obtained. The risks of intracranial hemorrhage of 10%, worsening neurological deficit, ventilator dependency, death and inability to revascularize were all reviewed in detail with the patient. The patient was then put under general anesthesia by the Department of Anesthesiology at [facility]. The right groin was prepped and draped in the usual sterile fashion. Thereafter using modified Seldinger technique, transfemoral access into the right common femoral artery was obtained without difficulty. Over an 0.035 inch guidewire an 8 French 25 cm Pinnacle sheath was inserted. Through this, and also over an 0.035 inch guidewire a combination of a 125 cm 6 [place] Berenstein support catheter inside of an 087 95 cm balloon guide catheter was advanced to the aortic arch region and advanced to the distal left common carotid artery. The guidewire, and the support catheter were removed. Good aspiration was obtained from the hub of the balloon guide catheter. A control arteriogram was then performed centered extra cranially and intracranially. FINDINGS: The left common carotid arteriogram demonstrates the left external carotid artery  and its major branches to be widely patent. The left internal carotid artery just distal to the bulb has a focal severe stenosis. 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 PROCEDURE: Through the balloon guide catheter in the distal left ICA, and over an 014 inch 300 cm exchange micro guidewire positioned in the horizontal petrous segment, control angioplasty of the proximal left internal carotid  stenosis was performed with a 4 mm x 30 cm Viatrac 14 angioplasty balloon catheter which had been prepped with 50% contrast and 50% heparinized saline infusion. Using the rapid exchange technique, the balloon was positioned with its markers adequate distant from the site of severe stenosis. A control inflation was then performed using a micro inflation syringe device via micro tubing to approximately 4 mm where it was maintained for approximately a minute and a half. Thereafter, following deflation of the balloon and removal a control arteriogram performed through the balloon guide catheter demonstrated now improved caliber of the angioplastied segment. At this time a 071 Zoom aspiration catheter was advanced in combination with an 021 160 cm Trevo ProVue microcatheter over the exchange micro guidewire to the proximal petrous segment. The 300 cm 014 exchange micro guidewire was then exchanged for an 018 inch micro guidewire with a moderate J configuration distally. The micro guidewire with the microcatheter was then gently advanced without difficulty through the occluded supraclinoid left ICA to the middle cerebral artery inferior division M2 segment followed by the microcatheter. The guidewire was removed. Good aspiration obtained from the hub of the microcatheter. A gentle control arteriogram performed through this demonstrated safe positioning of the tip of the microcatheter. A 4 mm x 40 mm Solitaire X retrieval device was then deployed with the proximal portion in the supraclinoid left ICA. At this time the Zoom aspiration catheter was advanced without difficulty to the mid M1 segment. Control aspiration was then performed for approximately a minute and a half at the hub of the Zoom aspiration catheter, and with a 20 mL syringe at the hub of the balloon guide catheter for approximately a minute and a half. The combination was then retrieved and removed. Following reversal of flow arrest in the left internal  carotid artery a control arteriogram performed demonstrated complete revascularization of the supraclinoid left ICA with opacification of the left middle cerebral artery achieving a TICI 3 revascularization. Also noted was opacification of the proximal left A1 segment with focal areas of severe stenosis due to arteriosclerosis. Bilateral PCOMs demonstrated opacification into the left posterior cerebral artery P1 segment. Underlying intracranial arteriosclerosis with resulting severe stenosis was noted just proximal to the origins of the posterior communicating arteries. Through the balloon guide catheter in the distal left ICA, an aspiration 5 French 115 cm guide catheter was then advanced to the proximal cavernous segment over an 035 inch guidewire. Through this, 160 cm Trevo ProVue microcatheter was advanced over an 018 inch micro guidewire. The microcatheter and the micro guidewire were advanced to the distal M2 M3 regions of the inferior division followed by the microcatheter. The guidewire was removed. Good aspiration obtained from the hub of the microcatheter. This in turn was exchanged for an 014 inch 300 cm exchange micro guidewire with a moderate J configuration at its distal end. A control arteriogram performed demonstrated safe positioning of the tip of the micro guidewire. Measurements  were then performed of the left ICA supraclinoid segment just proximal to the bifurcation and just distal to the origin of the ophthalmic artery. A 3 mm x 12 mm Onyx Frontier balloon mounted stent which had been prepped antegradely with 50% contrast and 50% heparinized saline infusion, and retrogradely with heparinized saline infusion, was advanced in a coaxial manner and with constant heparinized saline infusion and positioned without difficulty with the stent markers adequate distant from the area of severe stenosis. A control inflation was then performed using a micro inflation syringe device via micro tubing to just  over 3 mm x 2 where it was maintained for approximately a minute and a half. Following balloon deflation and retrieval and removal, a control arteriogram performed through the 5 [place] intermediate catheter demonstrated significantly improved caliber of the supraclinoid left ICA with a TICI 3 revascularization of the left MCA being persistent. Again demonstrated was the stenosed left anterior cerebral A1 segment. Over the exchange micro guidewire now in the proximal cavernous segment, a control arteriogram performed through the balloon guide catheter following removal of the intermediate catheter. Measurements were then performed of the left internal carotid artery proximally at the site of the significant stenosis and the distal left common carotid artery. A 6/8 mm x 40 mm Xact stent delivery apparatus was then advanced in a coaxial manner and with constant heparinized saline infusion and positioned without difficulty with the adequate coverage distal to the angioplastied segment and into the distal left common carotid artery. The stent was then deployed in the usual manner without any difficulty. The delivery apparatus was removed. A control arteriogram performed through the balloon guide catheter demonstrated significantly improved caliber through the stented segment of the proximal left ICA. The waist at the site of the angioplasty was then treated with a 5 mm x 30 mm Viatrac angioplasty microcatheter which was advanced after its preparation as described above. Prior to the angioplasty, proximal flow arrest was initiated by inflating the balloon in the distal left common carotid artery, and with proximal aspiration during the angioplasty. Following the angioplasty, the balloon was retrieved and removed while aspiration was maintained as flow arrest was reversed. Control arteriograms were then performed through the balloon guide catheter over the next 10-20 minutes continued to demonstrate excellent flow through  the centered in the proximal left ICA. Intracranially no change was seen in the TICI 3 revascularization of the left middle cerebral artery distribution. A 5 French diagnostic catheter was then advanced into the right common carotid artery over an 035 inch guidewire. The right common carotid artery injection demonstrated patency of the right internal carotid artery at the neck, and intracranially. The petrous, the cavernous and the supraclinoid right ICA demonstrate wide patency. The right middle cerebral artery and the right anterior cerebral artery demonstrate wide patency into the capillary and venous phases with a 50% stenosis of the right middle cerebral artery M1 segment. Cross-filling via the anterior communicating artery of the left anterior cerebral A2 segment, and the left M1 segment was evident. An 8 French Angio-Seal closure device was deployed at the right groin puncture site for hemostasis. Distal pulses remained present unchanged in both feet at the end of the procedure. Flat panel CT of the brain demonstrated no evidence of intracranial hemorrhage. Patient was extubated in was beginning to respond to simple commands appropriately slightly weaker on the right side. Medications utilized during the procedure. 81 mg of aspirin and Plavix 15 mg via orogastric tube prior to angioplasty. The  patient was also given half bolus dose of cangrelor followed by a 4 hour half dose infusion to be followed by CT of the brain. Patient was then transferred to the neuro ICU for post revascularization care. 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 Underlying a significant atherosclerotic related stenosis treated with a 3 mm x 12 mm Onyx Frontier balloon mounted stent with significantly improved caliber and flow.  Status post stent assisted angioplasty of proximal left ICA stenosis with proximal flow arrest as described without event. PLAN: Follow-up in the clinic 4 weeks post discharge. Electronically Signed   By: Julieanne Cotton M.D.   On: 02/02/2023 07:58   ECHOCARDIOGRAM COMPLETE Result Date: 02/01/2023    ECHOCARDIOGRAM REPORT   Patient Name:   Grant Berry Date of Exam: 02/01/2023 Medical Rec #:  [id number]          Height:       67.0 in Accession #:    [id number]         Weight:       119.3 lb Date of Birth:  [birth year]          BSA:          1.623 m Patient Age:    77 years           BP:           172/87 mmHg Patient Gender: M                  HR:           92 bpm. Exam Location:  Inpatient Procedure: 2D Echo, Cardiac Doppler and Color Doppler Indications:    Stroke  History:        Patient has prior history of Echocardiogram examinations, most                 recent 09/02/2019. Stroke; Risk Factors:Hypertension and                 Dyslipidemia.  Sonographer:    Milda Smart Referring Phys: [id number] [facility]  Sonographer Comments: Suboptimal parasternal window. Image acquisition challenging due to patient body habitus and Image acquisition challenging due to respiratory motion. IMPRESSIONS  1. Left ventricular ejection fraction, by estimation, is 60 to 65%. The left ventricle has normal function. The left ventricle has no regional wall motion abnormalities. Left ventricular diastolic parameters are consistent with Grade I diastolic dysfunction (impaired relaxation).  2. Right ventricular systolic function is normal. The right ventricular size is normal.  3. The mitral valve is abnormal. No evidence of mitral valve regurgitation. No evidence of mitral stenosis.  4. The aortic valve was not well visualized. There is mild calcification of the aortic valve. There is mild thickening of the aortic valve. Aortic valve regurgitation is not visualized. Aortic valve sclerosis is present, with no evidence of  aortic valve  stenosis.  5. The inferior vena cava is normal in size with greater than 50% respiratory variability, suggesting right atrial pressure of 3 mmHg. FINDINGS  Left Ventricle: Left ventricular ejection fraction, by estimation, is 60 to 65%. The left ventricle has normal function. The left ventricle has no regional wall motion abnormalities. The left ventricular internal cavity size was normal in size. There is  no left ventricular hypertrophy. Left ventricular diastolic  parameters are consistent with Grade I diastolic dysfunction (impaired relaxation). Right Ventricle: The right ventricular size is normal. No increase in right ventricular wall thickness. Right ventricular systolic function is normal. Left Atrium: Left atrial size was normal in size. Right Atrium: Right atrial size was normal in size. Pericardium: There is no evidence of pericardial effusion. Mitral Valve: The mitral valve is abnormal. There is mild thickening of the mitral valve leaflet(s). There is mild calcification of the mitral valve leaflet(s). Mild mitral annular calcification. No evidence of mitral valve regurgitation. No evidence of mitral valve stenosis. MV peak gradient, 5.8 mmHg. The mean mitral valve gradient is 3.0 mmHg. Tricuspid Valve: The tricuspid valve is normal in structure. Tricuspid valve regurgitation is not demonstrated. No evidence of tricuspid stenosis. Aortic Valve: The aortic valve was not well visualized. There is mild calcification of the aortic valve. There is mild thickening of the aortic valve. Aortic valve regurgitation is not visualized. Aortic valve sclerosis is present, with no evidence of aortic valve stenosis. Pulmonic Valve: The pulmonic valve was normal in structure. Pulmonic valve regurgitation is not visualized. No evidence of pulmonic stenosis. Aorta: The aortic root is normal in size and structure. Venous: The inferior vena cava is normal in size with greater than 50% respiratory variability,  suggesting right atrial pressure of 3 mmHg. IAS/Shunts: No atrial level shunt detected by color flow Doppler.  LEFT VENTRICLE PLAX 2D LVIDd:         4.40 cm   Diastology LVIDs:         3.20 cm   LV e' medial:    6.20 cm/s LV PW:         0.80 cm   LV E/e' medial:  13.4 LV IVS:        0.60 cm   LV e' lateral:   7.94 cm/s LVOT diam:     2.50 cm   LV E/e' lateral: 10.5 LVOT Area:     4.91 cm  RIGHT VENTRICLE             IVC RV S prime:     17.80 cm/s  IVC diam: 1.40 cm TAPSE (M-mode): 2.5 cm LEFT ATRIUM             Index        RIGHT ATRIUM           Index LA diam:        3.30 cm 2.03 cm/m   RA Area:     11.70 cm LA Vol (A2C):   19.1 ml 11.77 ml/m  RA Volume:   26.80 ml  16.51 ml/m LA Vol (A4C):   27.2 ml 16.76 ml/m LA Biplane Vol: 23.8 ml 14.66 ml/m   AORTA Ao Root diam: 3.60 cm Ao Asc diam:  3.40 cm MITRAL VALVE MV Area (PHT): 1.73 cm     SHUNTS MV Peak grad:  5.8 mmHg     Systemic Diam: 2.50 cm MV Mean grad:  3.0 mmHg MV Vmax:       1.20 m/s MV Vmean:      83.8 cm/s MV Decel Time: 438 msec MV E velocity: 83.30 cm/s MV A velocity: 108.00 cm/s MV E/A ratio:  0.77 Charlton Haws MD Electronically signed by Charlton Haws MD Signature Date/Time: 02/01/2023/3:01:07 PM    Final    MR BRAIN WO CONTRAST Result Date: 02/01/2023 CLINICAL DATA:  Stroke and embolectomy. EXAM: MRI HEAD WITHOUT CONTRAST TECHNIQUE: Multiplanar, multiecho pulse sequences of the brain and surrounding structures were  obtained without intravenous contrast. COMPARISON:  Head CT and MRI from yesterday FINDINGS: Brain: Moderate acute infarct in the left occipital cortex, increased from yesterday. Punctate acute infarct in the superior left frontal white matter since prior. There are areas of low-grade restricted diffusion in the left periatrial white matter and right occipital pole attributed to subacute ischemia. Subarachnoid hemorrhage along the anterior left frontal convexity, distinct from any areas of acute infarction and known from prior head  CT. Pre-existing bilateral cerebellar and brainstem small infarcts. Chronic lacunar infarcts in theright caudate. Ischemic gliosis is mild. 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 Generalized brain atrophy. Vascular: No flow seen in the left vertebral artery. There is a basilar flow void. Skull and upper cervical spine: No focal marrow lesion Sinuses/Orbits: No acute finding IMPRESSION: 1. Moderate acute infarct in the left occipital lobe with mild progression from yesterday. Interval punctate acute infarct in the left frontal white matter. 2. Areas of chronic and subacute ischemia preferentially affecting the posterior circulation. 3. Known subarachnoid hemorrhage at the anterior left frontal convexity, stable from preceding head CT. Electronically Signed   By: Tiburcio Pea M.D.   On: 02/01/2023 04:58   CT HEAD WO CONTRAST ( ) Result Date: 01/31/2023 CLINICAL DATA:  Stroke, follow-up; post canrelor infusion EXAM: CT HEAD WITHOUT CONTRAST TECHNIQUE: Contiguous axial images were obtained from the base of the skull through the vertex without intravenous contrast. RADIATION DOSE REDUCTION: This exam was performed according to the departmental dose-optimization program which includes automated exposure control, adjustment of the mA and/or kV according to patient size and/or use of iterative reconstruction technique. COMPARISON:  CT head 10/01/2022 and intra procedural CT 10/01/2022 FINDINGS: Brain: Hyperdense material in the left frontal sulci (series 5, image 47 and 60 and series 3, images 15-18 and 25 area and series 3, image 25), which may represent subarachnoid hemorrhage versus contrast staining. This was not apparent on the immediate postprocedural CT head. Redemonstrated cytotoxic edema in the in the left occipital lobe (series 3, image 14), which is slightly more prominent than on the prior exam. More heterogeneous hypodensity in the right occipital lobe, which  is favored to be chronic remote infarcts in the pons and bilateral cerebellar hemispheres. No evidence of acute infarction, hemorrhage, mass, mass effect, or midline shift. No hydrocephalus or extra-axial fluid collection. Vascular: No hyperdense vessel. Atherosclerotic calcifications in the intracranial carotid and vertebral arteries. Skull: Negative for fracture or focal lesion. Sinuses/Orbits: Mucosal thickening in the ethmoid air cells. No acute finding in the orbits. Status post bilateral lens replacements. Other: The mastoid air cells are well aerated. IMPRESSION: 1. Hyperdense material in the left frontal sulci, which may represent subarachnoid hemorrhage versus contrast staining. This was not apparent on the immediate postprocedural CT head. Recommend short-term follow-up head CT. 2. Redemonstrated cytotoxic edema in the left occipital lobe, which is slightly more prominent than on the prior exam. These results will be called to the ordering clinician or representative by the Radiologist Assistant, and communication documented in the PACS or [employer]. Electronically Signed   By: Wiliam Ke M.D.   On: 01/31/2023 20:52   CT CEREBRAL PERFUSION W CONTRAST Result Date: 01/31/2023 CLINICAL DATA:  Code stroke. 77 year old male. CTA yesterday demonstrating left ICA terminus, distal left vertebral artery, Basilar artery occlusions. EXAM: CT PERFUSION BRAIN TECHNIQUE: Multiphase CT imaging of the brain was performed following IV bolus contrast injection. Subsequent parametric perfusion maps were calculated using RAPID software. RADIATION DOSE REDUCTION: This  exam was performed according to the departmental dose-optimization program which includes automated exposure control, adjustment of the mA and/or kV according to patient size and/or use of iterative reconstruction technique. CONTRAST:  40mL OMNIPAQUE IOHEXOL 350 MG/ML SOLN COMPARISON:  Head CTs, CTA head and neck, and brain MRI earlier today.  FINDINGS: CT Brain Perfusion Findings: CBF (<30%) Volume: 0mL. No CBF or CBV parameter abnormality is detected. Perfusion (Tmax>6.0s) volume: . Fairly symmetric abnormal T-max throughout the bilateral cerebellum, posterior cerebral hemispheres. T-max > 4 seconds is more pronounced in the left cerebral hemisphere including the left MCA territory. There is volume of minimal T-max > 10 seconds in the right posterior circulation. Mismatch Volume: Infarct Core: No infarct core is detected Infarction Location:Oligemia throughout the posterior circulation, to a lesser extent the left MCA territory. IMPRESSION: Oligemia throughout the bilateral posterior circulation, including the posterior fossa. Less pronounced oligemia in the Left MCA territory. No infarct core, CBF or CBV parameter abnormality detected by CTP. These results were communicated to Dr. Roda Shutters at 8:12 am on 01/31/2023 by text page via the [facility] messaging system. Electronically Signed   By: Odessa Fleming M.D.   On: 01/31/2023 08:12   CT HEAD CODE STROKE WO CONTRAST Result Date: 01/31/2023 CLINICAL DATA:  Code stroke. 77 year old male. CTA yesterday demonstrating left ICA terminus, distal left vertebral artery, Basilar artery occlusions. EXAM: CT HEAD WITHOUT CONTRAST TECHNIQUE: Contiguous axial images were obtained from the base of the skull through the vertex without intravenous contrast. RADIATION DOSE REDUCTION: This exam was performed according to the departmental dose-optimization program which includes automated exposure control, adjustment of the mA and/or kV according to patient size and/or use of iterative reconstruction technique. COMPARISON:  Brain MRI 0244 hours today. CTA head and neck 0056 hours today. Plain head CT 0050 hours today. FINDINGS: Brain: Left occipital pole cytotoxic edema is slightly more conspicuous on CT now. Heterogeneous right occipital pole redemonstrated. Chronic left central pontine and bilateral cerebellar infarcts on  MRI appears stable by CT. No acute intracranial hemorrhage identified. No new cytotoxic edema compared to the earlier MRI. No midline shift, mass effect, or evidence of intracranial mass lesion. No ventriculomegaly. Vascular: Extensive Calcified atherosclerosis at the skull base. See also CTA earlier today. Skull: Stable and intact. Sinuses/Orbits: Visualized paranasal sinuses and mastoids are clear. Other: No acute orbit or scalp soft tissue finding. ASPECTS [facility] Stroke Program Early CT Score) Total score (0-10 with 10 being normal): 10; occipital pole cytotoxic edema. IMPRESSION: 1. Stable non contrast CT appearance of the brain since 0050 hours today: Occipital pole Cytotoxic edema stable from the MRI at 0242 hours today. No new edema, no intracranial hemorrhage, or mass effect. 2. 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 Electronically Signed   By: Odessa Fleming M.D.   On: 01/31/2023 08:08   MR BRAIN WO CONTRAST Result Date: 01/31/2023 CLINICAL DATA:  Dizziness and gait instability. Large vessel occlusion. EXAM: MRI HEAD WITHOUT CONTRAST TECHNIQUE: Multiplanar, multiecho pulse sequences of the brain and surrounding structures were obtained without intravenous contrast. COMPARISON:  CTA head neck 01/31/2023 Brain MRI 09/01/2019 FINDINGS: Brain: There are areas of acute/early subacute ischemia within both occipital lobes, left-greater-than-right. These areas correspond to the areas of infarction seen on the earlier noncontrast head CT (suggested to be chronic at that time). There is no acute ischemia of the brainstem, cerebellum or anterior circulation. There is multifocal hyperintense T2-weighted signal within the white matter. Generalized volume loss. There is hyperintense T2-weighted signal that corresponds  to both sites of infarction. There is a component of chronic infarct within the superior right occipital lobe (series 11, image 25). There are multiple old cerebellar infarcts. The midline  structures are normal. Vascular: Abnormal distal basilar artery flow void in keeping with known occlusion. Abnormal flow void at the skull base left ICA covers a longer segment than the demonstrated occlusion on the earlier CTA, likely due to inhibited upstream flow. Skull and upper cervical spine: Normal calvarium and skull base. Visualized upper cervical spine and soft tissues are normal. Sinuses/Orbits:No paranasal sinus fluid levels or advanced mucosal thickening. No mastoid or middle ear effusion. Normal orbits. IMPRESSION: 1. Acute/early subacute infarcts within both occipital lobes, left-greater-than-right. 2. No brainstem, cerebellar or anterior circulation acute ischemia. 3. Abnormal skull base flow voids in keeping with known vascular and left ICA occlusions. 4. Old superior right occipital and bilateral cerebellar infarcts. Electronically Signed   By: Deatra Robinson M.D.   On: 01/31/2023 03:24   CT ANGIO HEAD NECK W WO CM (CODE STROKE) Addendum Date: 01/31/2023 ADDENDUM REPORT: 01/31/2023 02:45 ADDENDUM: In addition to the above described ICA occlusion, the distal basilar artery is occluded. There is reconstitution of the basilar tip with patency of both PCAs maintained. The superior cerebellar arteries are occluded at their origin. Please note that this addendum was previously mistakenly associated with the noncontrast head CT for this patient performed on the same day. Electronically Signed   By: Deatra Robinson M.D.   On: 01/31/2023 02:45   Result Date: 01/31/2023 CLINICAL DATA:  Fall with dysarthria and dizziness EXAM: CT ANGIOGRAPHY HEAD AND NECK WITH AND WITHOUT CONTRAST TECHNIQUE: Multidetector CT imaging of the head and neck was performed using the standard protocol during bolus administration of intravenous contrast. Multiplanar CT image reconstructions and MIPs were obtained to evaluate the vascular anatomy. Carotid stenosis measurements (when applicable) are obtained utilizing NASCET  criteria, using the distal internal carotid diameter as the denominator. RADIATION DOSE REDUCTION: This exam was performed according to the departmental dose-optimization program which includes automated exposure control, adjustment of the mA and/or kV according to patient size and/or use of iterative reconstruction technique. CONTRAST:  75mL OMNIPAQUE IOHEXOL 350 MG/ML SOLN COMPARISON:  None Available. FINDINGS: CTA NECK FINDINGS SKELETON: No acute abnormality or high grade bony spinal canal stenosis. OTHER NECK: Normal pharynx, larynx and major salivary glands. No cervical lymphadenopathy. Unremarkable thyroid gland. UPPER CHEST: No pneumothorax or pleural effusion. No nodules or masses. AORTIC ARCH: There is calcific atherosclerosis of the aortic arch. Conventional 3 vessel aortic branching pattern. RIGHT CAROTID SYSTEM: No dissection, occlusion or aneurysm. There is mixed density atherosclerosis extending into the proximal ICA, resulting in less than 50% stenosis. LEFT CAROTID SYSTEM: No dissection, occlusion or aneurysm. There is mixed density atherosclerosis extending into the proximal ICA, resulting in 60% stenosis. VERTEBRAL ARTERIES: Right dominant configuration. Diminutive left vertebral artery with probable short segment occlusion of the V3 segment. Normal right vertebral artery to the skull base. CTA HEAD FINDINGS POSTERIOR CIRCULATION: Right vertebral artery atherosclerosis with moderate stenosis. Normal left V4 segment. No proximal occlusion of the anterior or inferior cerebellar arteries. Basilar artery is normal. Superior cerebellar arteries are normal. Posterior cerebral arteries are normal. ANTERIOR CIRCULATION: The left ICA is occluded at its terminus within occluded length of approximately 9 mm. The right ICA is mildly atherosclerotic without stenosis. Anterior cerebral arteries are normal. Moderate multifocal stenosis of the M1 segment of the right MCA. The left MCA is patent, likely filled by  collateral  flow along the circle-of-Willis. There is multifocal mild atherosclerotic irregularity without high-grade stenosis. VENOUS SINUSES: As permitted by contrast timing, patent. ANATOMIC VARIANTS: None Review of the MIP images confirms the above findings. IMPRESSION: 1. 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 The left MCA is patent, likely filled by collateral flow along the circle-of-Willis. 2. Moderate multifocal stenosis of the M1 segment of the right MCA. 3. Moderate right vertebral artery stenosis. 4. Bilateral carotid bifurcation atherosclerosis with 60% stenosis of the proximal left ICA. 5. Diminutive left vertebral artery with probable short segment occlusion of the V3 segment. Aortic Atherosclerosis (ICD10-I70.0). Critical Value/emergent results were called by telephone at the time of interpretation on 01/31/2023 at 1:17 am to provider [facility] , who verbally acknowledged these results. Electronically Signed: By: Deatra Robinson M.D. 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 There is reconstitution of the basilar tip with patency of both PCAs maintained. The superior cerebellar arteries are occluded at their origin. Electronically Signed   By: Deatra Robinson M.D.   On: 01/31/2023 01:30   Result Date: 01/31/2023 CLINICAL DATA:  Code stroke.  Fall with dizziness EXAM: CT HEAD WITHOUT CONTRAST TECHNIQUE: Contiguous axial images were obtained from the base of the skull through the vertex without intravenous contrast. RADIATION DOSE REDUCTION: This exam was performed according to the departmental dose-optimization program which includes automated exposure control, adjustment of the mA and/or kV according to patient size and/or use of iterative reconstruction  technique. COMPARISON:  None Available. FINDINGS: Brain: There is no mass, hemorrhage or extra-axial collection. The size and configuration of the ventricles and extra-axial CSF spaces are normal. There are multiple old cerebellar small vessel infarcts. Bilateral PCA territory infarctions also appear chronic. There is mild white matter hypoattenuation. Vascular: Atherosclerosis of the carotid and vertebral arteries at the skull base. Skull: Normal Sinuses/Orbits: No fluid levels or advanced mucosal thickening of the visualized paranasal sinuses. No mastoid or middle ear effusion. The orbits are normal. ASPECTS [facility] Stroke Program Early CT Score) - Ganglionic level infarction (caudate, lentiform nuclei, internal capsule, insula, M1-M3 cortex): 7 - Supraganglionic infarction (M4-M6 cortex): 3 Total score (0-10 with 10 being normal): 10 IMPRESSION: 1. No acute intracranial abnormality. 2. ASPECTS is 10. 3. 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 4. Bilateral PCA territory infarctions also appear chronic. These results were called by telephone at the time of interpretation on 01/31/2023 at 1:00 am to provider [facility] , who verbally acknowledged these results. Electronically Signed: By: Deatra Robinson M.D. On: 01/31/2023 01:00   CT C-SPINE NO CHARGE Result Date: 01/31/2023 CLINICAL DATA:  Neck pain, known left ICA occlusion EXAM: CT CERVICAL SPINE WITHOUT CONTRAST TECHNIQUE: Multidetector CT imaging of the cervical spine was performed without intravenous contrast. Multiplanar CT image reconstructions were also generated. RADIATION DOSE REDUCTION: This exam was performed according to the departmental dose-optimization program which includes automated exposure control, adjustment of the mA and/or kV according to patient size and/or use of iterative reconstruction technique. COMPARISON:  None Available. FINDINGS: Alignment: Within normal limits. Skull base and vertebrae: 7 cervical segments are well  visualized. Vertebral body height is well maintained. Mild osteophytic change and facet hypertrophic changes noted. No acute fracture or acute facet abnormality is noted. The odontoid is within normal limits. Soft tissues and spinal  canal: Surrounding soft tissue structures appear within normal limits. Upper chest: Visualized lung apices are unremarkable with the exception of some scarring in the right apex. Other: None IMPRESSION: Multilevel degenerative change without acute bony abnormality. Electronically Signed   By: Alcide Clever M.D.   On: 01/31/2023 01:25     PHYSICAL EXAM  Temp:  [98 F (36.7 C)-99 F (37.2 C)] 98 F (36.7 C) (12/14 1114) Pulse Rate:  [65-85] 76 (12/14 1114) Resp:  [16-18] 17 (12/14 1114) BP: (104-138)/(50-84) 105/69 (12/14 1114) SpO2:  [96 %-99 %] 99 % (12/14 1114)  General - Well nourished, well developed, not in acute distress  Cardiovascular - Regular rhythm and rate..  Neuro -Awake and alert.  Paucity of speech but with moderate dysarthria, can understand that he wants tv on after having to repeat himself.  Follows simple commands. No gaze palsy, tracking bilaterally, right hemianopia, PERRL. No facial droop. Right facial dressing for wound from fall. Tongue midline. Bilateral UEs 4/5, no drift. Bilaterally LEs 3/5 proximal and 4/5 distally, no drift. Sensation symmetrical bilaterally, chronic left UE ataxia and left LE dysmetria, gait not tested.     ASSESSMENT/PLAN Grant Berry is a 77 y.o. male with history of hypertension, hyperlipidemia, stroke admitted for multiple falls. No tPA given due to outside window.    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  Left  ICA proximal 60% stenosis, right multifocal M1 moderate stenosis, right VA stenosis. MRI left MCA/PCA small to moderate infarct.  Old right PCA infarct. Due to developing expressive aphasia, repeat CT showed no acute change CTP no core infarct, increased TTP at posterior circulation and left MCA territory Status post IR with terminal ICA stenting and proximal ICA angioplasty with TICI3 reperfusion Post IR CT repeat left PCA progressive infarct. Hyperdense material in the left frontal sulci, which may represent subarachnoid hemorrhage versus contrast staining. MRI repeat Moderate acute infarct in the left occipital lobe with mild progression from yesterday. Interval punctate acute infarct in the left frontal white matter. 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 MRI repeat 12/12 showed Interval hemorrhagic transformation with a 2.7 x 1.5 cm hematoma at the left occipital lobe. Additional new acute intraparenchymal hemorrhage at the posterior left basal ganglia measuring 2.2 x 1.9 cm CT repeat done today and stable.  2D Echo EF 60 to 65% LDL 167 HgbA1c 5.1 UDS negative Lovenox for VTE prophylaxis No antithrombotic prior to admission, now on aspirin 81 mg daily and clopidogrel 75 mg daily for intracranial stenting.  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 Bilateral P-comm and PCA patent Avoid low BP No intervention needed at this time, discussed with Dr. Corliss Skains  History of stroke 08/2019 admitted for dizziness, imbalance and leaning towards to the left.  CT showed left cerebellar infarct which confirmed on MRI.  MRI showed left SCA occlusion, left VA hypoplastic.  2D echo unremarkable.  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  Discharged on DAPT and  Lipitor. Follow-up with Dr. Pearlean Brownie at [facility]  Delirium, improving Less  restless and agitation now Tele sitter is on at night On seroquel 25 bid -> 25 am and 50 hs  Hx of hypertension Hypotension post procedure Stable now S/p albumin Avoid low BP Long term BP goal normotensive  Hyperlipidemia Home meds: Zetia LDL 167, goal < 70 Statin intolerance (Lipitor and pravastatin) Continue Zetia at discharge Not candidate for Leqvio   Other Stroke Risk Factors Advanced age Former smoker  Other Active Problems AKI, creatinine 1.40--1.06--1.07--1.21 --1.21--1.09, encourage po intake  Hospital day # 6 CT stable. Con't DAPT. CIR pending.  Total of 35 mins spent reviewing chart, discussion with patient  on prognosis, Dx and plan.  Reviewed Imaging personally.    To contact Stroke Continuity provider, please refer to [email or web address]. After hours, contact General Neurology

## 2023-02-06 NOTE — Plan of Care (Signed)
  Problem: Education: Goal: Knowledge of disease or condition will improve Outcome: Progressing   Problem: Coping: Goal: Will identify appropriate support needs Outcome: Progressing   Problem: Nutrition: Goal: Risk of aspiration will decrease Outcome: Progressing Goal: Dietary intake will improve Outcome: Progressing   Problem: Clinical Measurements: Goal: Respiratory complications will improve Outcome: Progressing Goal: Cardiovascular complication will be avoided Outcome: Progressing   Problem: Skin Integrity: Goal: Risk for impaired skin integrity will decrease Outcome: Progressing

## 2023-02-07 MED ORDER — POLYETHYLENE GLYCOL 3350 17 G PO PACK
17.0000 g | PACK | Freq: Every day | ORAL | Status: DC
  Administered 2023-02-07 – 2023-02-11 (×4): 17 g via ORAL
  Filled 2023-02-07 (×4): qty 1

## 2023-02-07 NOTE — Plan of Care (Signed)
   Problem: Education: Goal: Knowledge of disease or condition will improve Outcome: Progressing Goal: Knowledge of secondary prevention will improve (MUST DOCUMENT ALL) Outcome: Progressing Goal: Knowledge of patient specific risk factors will improve Loraine Leriche N/A or DELETE if not current risk factor) Outcome: Progressing   Problem: Ischemic Stroke/TIA Tissue Perfusion: Goal: Complications of ischemic stroke/TIA will be minimized Outcome: Progressing   Problem: Coping: Goal: Will verbalize positive feelings about self Outcome: Progressing Goal: Will identify appropriate support needs Outcome: Progressing   Problem: Health Behavior/Discharge Planning: Goal: Ability to manage health-related needs will improve Outcome: Progressing Goal: Goals will be collaboratively established with patient/family Outcome: Progressing   Problem: Self-Care: Goal: Ability to participate in self-care as condition permits will improve Outcome: Progressing Goal: Verbalization of feelings and concerns over difficulty with self-care will improve Outcome: Progressing Goal: Ability to communicate needs accurately will improve Outcome: Progressing   Problem: Nutrition: Goal: Risk of aspiration will decrease Outcome: Progressing Goal: Dietary intake will improve Outcome: Progressing   Problem: Education: Goal: Understanding of CV disease, CV risk reduction, and recovery process will improve Outcome: Progressing Goal: Individualized Educational Video(s) Outcome: Progressing   Problem: Activity: Goal: Ability to return to baseline activity level will improve Outcome: Progressing   Problem: Cardiovascular: Goal: Ability to achieve and maintain adequate cardiovascular perfusion will improve Outcome: Progressing Goal: Vascular access site(s) Level 0-1 will be maintained Outcome: Progressing   Problem: Health Behavior/Discharge Planning: Goal: Ability to safely manage health-related needs after  discharge will improve Outcome: Progressing   Problem: Education: Goal: Knowledge of General Education information will improve Description: Including pain rating scale, medication(s)/side effects and non-pharmacologic comfort measures Outcome: Progressing   Problem: Health Behavior/Discharge Planning: Goal: Ability to manage health-related needs will improve Outcome: Progressing   Problem: Clinical Measurements: Goal: Ability to maintain clinical measurements within normal limits will improve Outcome: Progressing Goal: Will remain free from infection Outcome: Progressing Goal: Diagnostic test results will improve Outcome: Progressing Goal: Respiratory complications will improve Outcome: Progressing Goal: Cardiovascular complication will be avoided Outcome: Progressing   Problem: Activity: Goal: Risk for activity intolerance will decrease Outcome: Progressing   Problem: Nutrition: Goal: Adequate nutrition will be maintained Outcome: Progressing   Problem: Coping: Goal: Level of anxiety will decrease Outcome: Progressing   Problem: Elimination: Goal: Will not experience complications related to bowel motility Outcome: Progressing Goal: Will not experience complications related to urinary retention Outcome: Progressing   Problem: Pain Management: Goal: General experience of comfort will improve Outcome: Progressing   Problem: Safety: Goal: Ability to remain free from injury will improve Outcome: Progressing   Problem: Skin Integrity: Goal: Risk for impaired skin integrity will decrease Outcome: Progressing

## 2023-02-07 NOTE — Plan of Care (Signed)
  Problem: Education: Goal: Knowledge of disease or condition will improve Outcome: Progressing   Problem: Ischemic Stroke/TIA Tissue Perfusion: Goal: Complications of ischemic stroke/TIA will be minimized Outcome: Progressing   Problem: Coping: Goal: Will verbalize positive feelings about self Outcome: Progressing Goal: Will identify appropriate support needs Outcome: Progressing   Problem: Self-Care: Goal: Ability to communicate needs accurately will improve Outcome: Progressing   Problem: Nutrition: Goal: Risk of aspiration will decrease Outcome: Progressing Goal: Dietary intake will improve Outcome: Progressing   Problem: Education: Goal: Knowledge of General Education information will improve Description: Including pain rating scale, medication(s)/side effects and non-pharmacologic comfort measures Outcome: Progressing   Problem: Clinical Measurements: Goal: Respiratory complications will improve Outcome: Progressing Goal: Cardiovascular complication will be avoided Outcome: Progressing

## 2023-02-07 NOTE — Progress Notes (Signed)
STROKE TEAM PROGRESS NOTE   SUBJECTIVE (INTERVAL HISTORY) No family is at the bedside.    He is doing well. No complaints.   OBJECTIVE Temp:  [97.6 F (36.4 C)-98.8 F (37.1 C)] 98.8 F (37.1 C) (12/15 1158) Pulse Rate:  [63-85] 78 (12/15 1158) Cardiac Rhythm: Normal sinus rhythm (12/14 1900) Resp:  [17-18] 18 (12/15 1158) BP: (112-138)/(62-79) 130/62 (12/15 1158) SpO2:  [96 %-98 %] 97 % (12/15 1158)  No results for input(s): "GLUCAP" in the last 168 hours.  Recent Labs  Lab 02/02/23 0455 02/03/23 0604 02/04/23 0727 02/05/23 0624 02/06/23 0557  NA 136 136 136 138 135  K 3.4* 3.3* 3.5 3.5 3.9  CL 101 102 101 104 101  CO2 24 24 27 24 25   GLUCOSE 101* 104* 103* 98 98  BUN 14 12 16 14 13   CREATININE 1.07 1.21 1.21 1.09 1.05  CALCIUM 9.4 9.2 8.7* 8.8* 8.7*   No results for input(s): "AST", "ALT", "ALKPHOS", "BILITOT", "PROT", "ALBUMIN" in the last 168 hours.  Recent Labs  Lab 02/01/23 0444 02/02/23 0455 02/03/23 0604 02/04/23 0727 02/05/23 0624 02/06/23 0557  WBC 6.3 8.4 8.8 7.5 5.6 6.1  NEUTROABS 4.5  --   --   --   --   --   HGB 9.3* 11.0* 10.5* 10.3* 9.5* 9.2*  HCT 29.1* 32.5* 30.9* 30.9* 28.9* 27.1*  MCV 92.7 88.3 88.3 90.1 89.2 88.9  PLT 153 165 168 188 190 208        Component Value Date/Time   CHOL 235 (H) 01/31/2023 0454   CHOL 202 (H) 07/10/2022 1012   CHOL 219 (H) 06/17/2012 1620   TRIG 47 01/31/2023 0454   TRIG 215 (H) 06/17/2012 1620   HDL 59 01/31/2023 0454   HDL 48 07/10/2022 1012   HDL 34 (L) 06/17/2012 1620   CHOLHDL 4.0 01/31/2023 0454   VLDL 9 01/31/2023 0454   LDLCALC 167 (H) 01/31/2023 0454   LDLCALC 134 (H) 07/10/2022 1012   LDLCALC 142 (H) 06/17/2012 1620   Lab Results  Component Value Date   HGBA1C 5.1 01/31/2023      Component Value Date/Time   LABOPIA NONE DETECTED 01/31/2023 0039   COCAINSCRNUR NONE DETECTED 01/31/2023 0039   LABBENZ NONE DETECTED 01/31/2023 0039   AMPHETMU NONE DETECTED 01/31/2023 0039   THCU NONE  DETECTED 01/31/2023 0039   LABBARB NONE DETECTED 01/31/2023 0039    No results for input(s): "ETH" in the last 168 hours.   I have personally reviewed the radiological images below and agree with the radiology interpretations.  CT HEAD WO CONTRAST ( ) Result Date: 02/06/2023 CLINICAL DATA:  77 year old male code stroke presentation on 01/31/2023 with multiple intracranial occlusions. Status post endovascular reperfusion, stenting. Left lentiform hemorrhagic transformation in the left basal ganglia and occipital lobe. EXAM: CT HEAD WITHOUT CONTRAST TECHNIQUE: Contiguous axial images were obtained from the base of the skull through the vertex without intravenous contrast. RADIATION DOSE REDUCTION: This exam was performed according to the departmental dose-optimization program which includes automated exposure control, adjustment of the mA and/or kV according to patient size and/or use of iterative reconstruction technique. COMPARISON:  Brain MRI 02/04/2023. FINDINGS: Brain: Oval left lentiform hemorrhage is 25 x 17 by 32 mm (AP by transverse by CC) for estimated volume of 7 mL and appears mildly larger since the MRI on 02/04/2023. Regional hypodense edema. Oval left occipital pole cortical and subcortical hyperdense hemorrhage superimposed on confluent cytotoxic edema there is 19 x 42 x 14 mm (AP  by transverse by CC) for an estimated blood volume of 6 mL, stable since 02/04/2023. No intraventricular blood. Trace additional left hemisphere subarachnoid hemorrhage suspected such as on sagittal image 43 left superior frontal gyrus. No midline shift or significant intracranial mass effect. Mild mass effect on the left lateral ventricle is stable. No ventriculomegaly. Underlying scattered chronic cerebral encephalomalacia including in the posterior fossa. No new cortically based infarct identified. Vascular: Distal left ICA stent. Calcified atherosclerosis at the skull base. Skull: Stable, intact.  Sinuses/Orbits: Visualized paranasal sinuses and mastoids are stable and well aerated. Other: No acute orbit or scalp soft tissue finding. IMPRESSION: 1. Left lentiform hemorrhage estimated at 7 mL appears slightly larger since 02/04/2023. Hemorrhagic transformation of the left PCA infarct estimated at 6 mL is stable. Trace left hemisphere subarachnoid hemorrhage suspected. 2. No midline shift or significant intracranial mass effect. No new intracranial abnormality. Multifocal chronic ischemic disease. Electronically Signed   By: Odessa Fleming M.D.   On: 02/06/2023 09:44   MR BRAIN WO CONTRAST Addendum Date: 02/04/2023 ADDENDUM REPORT: 02/04/2023 21:09 ADDENDUM: Results were communicated by telephone at the time of interpretation on 02/04/2023 at 9 p.m. to provider Dr. Otelia Limes, who verbally acknowledged these results. Electronically Signed   By: Rise Mu M.D.   On: 02/04/2023 21:09   Result Date: 02/04/2023 CLINICAL DATA:  Follow-up examination for stroke. EXAM: MRI HEAD WITHOUT CONTRAST TECHNIQUE: Multiplanar, multiecho pulse sequences of the brain and surrounding structures were obtained without intravenous contrast. COMPARISON:  Previous MRI from 02/01/2023 and CT from 01/31/2023 FINDINGS: Brain: Atrophy with chronic microvascular ischemic disease. Evolving late subacute right PCA distribution infarct involving the right occipital lobe. Multifocal chronic infarcts involving the left greater than right cerebellum. Few scattered remote lacunar infarcts about the basal ganglia and pons. Continued interval evolution of subacute left PCA distribution infarct, similar in size as compared to previous. Few additional patchy foci of all vein subacute ischemia noted superiorly within the subcortical posterior left frontal parietal region, also similar. There has been evidence for hemorrhagic transformation with a hematoma measuring 2.7 x 1.5 cm now seen at the left occipital lobe (series 14, image 22).  Additional new acute intraparenchymal hemorrhage at the posterior left basal ganglia measures approximately 2.2 x 1.9 cm (series 14, image 32). No significant regional mass effect. No intraventricular extension. Additional small volume subarachnoid hemorrhage at the anterior left frontal convexity, slightly less conspicuous as compared to prior. Additional scattered chronic micro hemorrhages noted, stable. No other new or interval infarction elsewhere. 3.7 cm benign arachnoid cyst at the left frontal vertex again noted. No other mass lesion or midline shift. No hydrocephalus. No extra-axial fluid collection. Vascular: Loss of normal flow void within the left vertebral artery, stable. Heterogeneous but preserved flow voids within the remaining posterior circulation. Normal flow voids seen within the anterior circulation. Skull and upper cervical spine: Craniocervical junction within normal limits. Bone marrow signal intensity normal. No scalp soft tissue abnormality. Sinuses/Orbits: Prior bilateral ocular lens replacement. Paranasal sinuses remain largely clear. No significant mastoid effusion. Other: 9 mm T2 hyperintense lesion within the right parotid gland (series 15, image 21), indeterminate. IMPRESSION: 1. Continued interval evolution of subacute left PCA distribution infarct, similar in size as compared to previous. Interval hemorrhagic transformation with a 2.7 x 1.5 cm hematoma at the left occipital lobe. Additional new acute intraparenchymal hemorrhage at the posterior left basal ganglia measuring 2.2 x 1.9 cm. No significant regional mass effect. No intraventricular extension. 2. Additional small volume subarachnoid hemorrhage  at the anterior left frontal convexity, slightly less conspicuous as compared to prior. 3. Evolving late subacute right PCA distribution infarct, stable. 4. Underlying atrophy with chronic microvascular ischemic disease, with multiple chronic infarcts as above. 5. 9 mm T2 hyperintense  lesion within the right parotid gland, indeterminate. Correlation with nonemergent outpatient ultrasound suggested for further evaluation. The covering neurohospitalist has been paged regarding these findings, currently awaiting a call back. Results will be conveyed as soon as possible. Electronically Signed: By: Rise Mu M.D. On: 02/04/2023 20:59   IR PERCUTANEOUS ART THROMBECTOMY/INFUSION INTRACRANIAL INC DIAG ANGIO Result Date: 02/02/2023 INDICATION: History of repeated falls. Patient with new onset expressive aphasia and possible right sided weakness. 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 Also noted mid basilar artery occlusion. EXAM: 1. EMERGENT LARGE VESSEL OCCLUSION THROMBOLYSIS (anterior CIRCULATION) COMPARISON:  Recent CT angiogram of the head and neck, and MRI of the brain. MEDICATIONS: Ancef 2 g IV was administered within 1 hour of the procedure. ANESTHESIA/SEDATION: General anesthesia. CONTRAST:  Omnipaque 300 approximately 110 mL. FLUOROSCOPY TIME:  Fluoroscopy Time: 31 minutes 42 seconds (1110 mGy). COMPLICATIONS: None immediate. TECHNIQUE: Following a full explanation of the procedure along with the potential associated complications, an informed witnessed consent was obtained. The risks of intracranial hemorrhage of 10%, worsening neurological deficit, ventilator dependency, death and inability to revascularize were all reviewed in detail with the patient. The patient was then put under general anesthesia by the Department of Anesthesiology at [facility]. The right groin was prepped and draped in the usual sterile fashion. Thereafter using modified Seldinger technique, transfemoral access into the right common femoral artery was obtained without difficulty. Over an 0.035 inch guidewire an 8 French 25 cm Pinnacle sheath was inserted. Through this, and also over an 0.035 inch guidewire a  combination of a 125 cm 6 [place] Berenstein support catheter inside of an 087 95 cm balloon guide catheter was advanced to the aortic arch region and advanced to the distal left common carotid artery. The guidewire, and the support catheter were removed. Good aspiration was obtained from the hub of the balloon guide catheter. A control arteriogram was then performed centered extra cranially and intracranially. FINDINGS: The left common carotid arteriogram demonstrates the left external carotid artery and its major branches to be widely patent. The left internal carotid artery just distal to the bulb has a focal severe stenosis. 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 PROCEDURE: Through the balloon guide catheter in the distal left ICA, and over an 014 inch 300 cm exchange micro guidewire positioned in the horizontal petrous segment, control angioplasty of the proximal left internal carotid stenosis was performed with a 4 mm x 30 cm Viatrac 14 angioplasty balloon catheter which had been prepped with 50% contrast and 50% heparinized saline infusion. Using the rapid exchange technique, the balloon was positioned with its markers adequate distant from the site of severe stenosis. A control inflation was then performed using a micro inflation syringe device via micro tubing to approximately 4 mm where it was maintained for approximately a minute and a half. Thereafter, following deflation of the balloon and removal a control arteriogram performed through the balloon guide catheter demonstrated now improved caliber of the angioplastied segment. At this time a 071 Zoom aspiration  catheter was advanced in combination with an 021 160 cm Trevo ProVue microcatheter over the exchange micro guidewire to the proximal petrous segment. The 300 cm 014 exchange micro guidewire was  then exchanged for an 018 inch micro guidewire with a moderate J configuration distally. The micro guidewire with the microcatheter was then gently advanced without difficulty through the occluded supraclinoid left ICA to the middle cerebral artery inferior division M2 segment followed by the microcatheter. The guidewire was removed. Good aspiration obtained from the hub of the microcatheter. A gentle control arteriogram performed through this demonstrated safe positioning of the tip of the microcatheter. A 4 mm x 40 mm Solitaire X retrieval device was then deployed with the proximal portion in the supraclinoid left ICA. At this time the Zoom aspiration catheter was advanced without difficulty to the mid M1 segment. Control aspiration was then performed for approximately a minute and a half at the hub of the Zoom aspiration catheter, and with a 20 mL syringe at the hub of the balloon guide catheter for approximately a minute and a half. The combination was then retrieved and removed. Following reversal of flow arrest in the left internal carotid artery a control arteriogram performed demonstrated complete revascularization of the supraclinoid left ICA with opacification of the left middle cerebral artery achieving a TICI 3 revascularization. Also noted was opacification of the proximal left A1 segment with focal areas of severe stenosis due to arteriosclerosis. Bilateral PCOMs demonstrated opacification into the left posterior cerebral artery P1 segment. Underlying intracranial arteriosclerosis with resulting severe stenosis was noted just proximal to the origins of the posterior communicating arteries. Through the balloon guide catheter in the distal left ICA, an aspiration 5 French 115 cm guide catheter was then advanced to the proximal cavernous segment over an 035 inch guidewire. Through this, 160 cm Trevo ProVue microcatheter was advanced over an 018 inch micro guidewire. The microcatheter and the micro  guidewire were advanced to the distal M2 M3 regions of the inferior division followed by the microcatheter. The guidewire was removed. Good aspiration obtained from the hub of the microcatheter. This in turn was exchanged for an 014 inch 300 cm exchange micro guidewire with a moderate J configuration at its distal end. A control arteriogram performed demonstrated safe positioning of the tip of the micro guidewire. Measurements were then performed of the left ICA supraclinoid segment just proximal to the bifurcation and just distal to the origin of the ophthalmic artery. A 3 mm x 12 mm Onyx Frontier balloon mounted stent which had been prepped antegradely with 50% contrast and 50% heparinized saline infusion, and retrogradely with heparinized saline infusion, was advanced in a coaxial manner and with constant heparinized saline infusion and positioned without difficulty with the stent markers adequate distant from the area of severe stenosis. A control inflation was then performed using a micro inflation syringe device via micro tubing to just over 3 mm x 2 where it was maintained for approximately a minute and a half. Following balloon deflation and retrieval and removal, a control arteriogram performed through the 5 [place] intermediate catheter demonstrated significantly improved caliber of the supraclinoid left ICA with a TICI 3 revascularization of the left MCA being persistent. Again demonstrated was the stenosed left anterior cerebral A1 segment. Over the exchange micro guidewire now in the proximal cavernous segment, a control arteriogram performed through the balloon guide catheter following removal of the intermediate catheter. Measurements were then performed of the left internal carotid artery proximally at the site  of the significant stenosis and the distal left common carotid artery. A 6/8 mm x 40 mm Xact stent delivery apparatus was then advanced in a coaxial manner and with constant heparinized saline  infusion and positioned without difficulty with the adequate coverage distal to the angioplastied segment and into the distal left common carotid artery. The stent was then deployed in the usual manner without any difficulty. The delivery apparatus was removed. A control arteriogram performed through the balloon guide catheter demonstrated significantly improved caliber through the stented segment of the proximal left ICA. The waist at the site of the angioplasty was then treated with a 5 mm x 30 mm Viatrac angioplasty microcatheter which was advanced after its preparation as described above. Prior to the angioplasty, proximal flow arrest was initiated by inflating the balloon in the distal left common carotid artery, and with proximal aspiration during the angioplasty. Following the angioplasty, the balloon was retrieved and removed while aspiration was maintained as flow arrest was reversed. Control arteriograms were then performed through the balloon guide catheter over the next 10-20 minutes continued to demonstrate excellent flow through the centered in the proximal left ICA. Intracranially no change was seen in the TICI 3 revascularization of the left middle cerebral artery distribution. A 5 French diagnostic catheter was then advanced into the right common carotid artery over an 035 inch guidewire. The right common carotid artery injection demonstrated patency of the right internal carotid artery at the neck, and intracranially. The petrous, the cavernous and the supraclinoid right ICA demonstrate wide patency. The right middle cerebral artery and the right anterior cerebral artery demonstrate wide patency into the capillary and venous phases with a 50% stenosis of the right middle cerebral artery M1 segment. Cross-filling via the anterior communicating artery of the left anterior cerebral A2 segment, and the left M1 segment was evident. An 8 French Angio-Seal closure device was deployed at the right groin  puncture site for hemostasis. Distal pulses remained present unchanged in both feet at the end of the procedure. Flat panel CT of the brain demonstrated no evidence of intracranial hemorrhage. Patient was extubated in was beginning to respond to simple commands appropriately slightly weaker on the right side. Medications utilized during the procedure. 81 mg of aspirin and Plavix 15 mg via orogastric tube prior to angioplasty. The patient was also given half bolus dose of cangrelor followed by a 4 hour half dose infusion to be followed by CT of the brain. Patient was then transferred to the neuro ICU for post revascularization care. 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 Underlying a significant atherosclerotic related stenosis treated with a 3 mm x 12 mm Onyx Frontier balloon mounted stent with significantly improved caliber and flow. Status post stent assisted angioplasty of proximal left ICA stenosis with proximal flow arrest as described without event. PLAN: Follow-up in the clinic 4 weeks post discharge. Electronically Signed   By: Julieanne Cotton M.D.   On: 02/02/2023 07:58   IR CT Head Ltd Result Date: 02/02/2023 INDICATION: History of repeated falls. Patient with new onset expressive aphasia and possible right sided weakness. 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 Also noted mid basilar artery occlusion. EXAM: 1. EMERGENT LARGE VESSEL OCCLUSION THROMBOLYSIS (anterior CIRCULATION) COMPARISON:  Recent CT angiogram of the head and neck, and MRI of the brain. MEDICATIONS: Ancef 2 g IV was administered within 1 hour of the procedure. ANESTHESIA/SEDATION: General anesthesia. CONTRAST:  Omnipaque 300 approximately 110  mL. FLUOROSCOPY TIME:  Fluoroscopy Time: 31 minutes 42 seconds (1110 mGy). COMPLICATIONS: None immediate. TECHNIQUE: Following a full explanation of the procedure along with the potential associated complications, an informed witnessed consent was obtained. The risks of intracranial hemorrhage of 10%, worsening neurological deficit, ventilator dependency, death and inability to revascularize were all reviewed in detail with the patient. The patient was then put under general anesthesia by the Department of Anesthesiology at [facility]. The right groin was prepped and draped in the usual sterile fashion. Thereafter using modified Seldinger technique, transfemoral access into the right common femoral artery was obtained without difficulty. Over an 0.035 inch guidewire an 8 French 25 cm Pinnacle sheath was inserted. Through this, and also over an 0.035 inch guidewire a combination of a 125 cm 6 [place] Berenstein support catheter inside of an 087 95 cm balloon guide catheter was advanced to the aortic arch region and advanced to the distal left common carotid artery. The guidewire, and the support catheter were removed. Good aspiration was obtained from the hub of the balloon guide catheter. A control arteriogram was then performed centered extra cranially and intracranially. FINDINGS: The left common carotid arteriogram demonstrates the left external carotid artery and its major branches to be widely patent. The left internal carotid artery just distal to the bulb has a focal severe stenosis. 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 PROCEDURE: Through the balloon guide catheter in the distal left ICA, and over an 014 inch 300 cm exchange micro guidewire positioned in the horizontal petrous segment, control angioplasty of the proximal left internal carotid  stenosis was performed with a 4 mm x 30 cm Viatrac 14 angioplasty balloon catheter which had been prepped with 50% contrast and 50% heparinized saline infusion. Using the rapid exchange technique, the balloon was positioned with its markers adequate distant from the site of severe stenosis. A control inflation was then performed using a micro inflation syringe device via micro tubing to approximately 4 mm where it was maintained for approximately a minute and a half. Thereafter, following deflation of the balloon and removal a control arteriogram performed through the balloon guide catheter demonstrated now improved caliber of the angioplastied segment. At this time a 071 Zoom aspiration catheter was advanced in combination with an 021 160 cm Trevo ProVue microcatheter over the exchange micro guidewire to the proximal petrous segment. The 300 cm 014 exchange micro guidewire was then exchanged for an 018 inch micro guidewire with a moderate J configuration distally. The micro guidewire with the microcatheter was then gently advanced without difficulty through the occluded supraclinoid left ICA to the middle cerebral artery inferior division M2 segment followed by the microcatheter. The guidewire was removed. Good aspiration obtained from the hub of the microcatheter. A gentle control arteriogram performed through this demonstrated safe positioning of the tip of the microcatheter. A 4 mm x 40 mm Solitaire X retrieval device was then deployed with the proximal portion in the supraclinoid left ICA. At this time the Zoom aspiration catheter was advanced without difficulty to the mid M1 segment. Control aspiration was then performed for approximately a minute and  a half at the hub of the Zoom aspiration catheter, and with a 20 mL syringe at the hub of the balloon guide catheter for approximately a minute and a half. The combination was then retrieved and removed. Following reversal of flow arrest in the left internal  carotid artery a control arteriogram performed demonstrated complete revascularization of the supraclinoid left ICA with opacification of the left middle cerebral artery achieving a TICI 3 revascularization. Also noted was opacification of the proximal left A1 segment with focal areas of severe stenosis due to arteriosclerosis. Bilateral PCOMs demonstrated opacification into the left posterior cerebral artery P1 segment. Underlying intracranial arteriosclerosis with resulting severe stenosis was noted just proximal to the origins of the posterior communicating arteries. Through the balloon guide catheter in the distal left ICA, an aspiration 5 French 115 cm guide catheter was then advanced to the proximal cavernous segment over an 035 inch guidewire. Through this, 160 cm Trevo ProVue microcatheter was advanced over an 018 inch micro guidewire. The microcatheter and the micro guidewire were advanced to the distal M2 M3 regions of the inferior division followed by the microcatheter. The guidewire was removed. Good aspiration obtained from the hub of the microcatheter. This in turn was exchanged for an 014 inch 300 cm exchange micro guidewire with a moderate J configuration at its distal end. A control arteriogram performed demonstrated safe positioning of the tip of the micro guidewire. Measurements were then performed of the left ICA supraclinoid segment just proximal to the bifurcation and just distal to the origin of the ophthalmic artery. A 3 mm x 12 mm Onyx Frontier balloon mounted stent which had been prepped antegradely with 50% contrast and 50% heparinized saline infusion, and retrogradely with heparinized saline infusion, was advanced in a coaxial manner and with constant heparinized saline infusion and positioned without difficulty with the stent markers adequate distant from the area of severe stenosis. A control inflation was then performed using a micro inflation syringe device via micro tubing to just  over 3 mm x 2 where it was maintained for approximately a minute and a half. Following balloon deflation and retrieval and removal, a control arteriogram performed through the 5 [place] intermediate catheter demonstrated significantly improved caliber of the supraclinoid left ICA with a TICI 3 revascularization of the left MCA being persistent. Again demonstrated was the stenosed left anterior cerebral A1 segment. Over the exchange micro guidewire now in the proximal cavernous segment, a control arteriogram performed through the balloon guide catheter following removal of the intermediate catheter. Measurements were then performed of the left internal carotid artery proximally at the site of the significant stenosis and the distal left common carotid artery. A 6/8 mm x 40 mm Xact stent delivery apparatus was then advanced in a coaxial manner and with constant heparinized saline infusion and positioned without difficulty with the adequate coverage distal to the angioplastied segment and into the distal left common carotid artery. The stent was then deployed in the usual manner without any difficulty. The delivery apparatus was removed. A control arteriogram performed through the balloon guide catheter demonstrated significantly improved caliber through the stented segment of the proximal left ICA. The waist at the site of the angioplasty was then treated with a 5 mm x 30 mm Viatrac angioplasty microcatheter which was advanced after its preparation as described above. Prior to the angioplasty, proximal flow arrest was initiated by inflating the balloon in the distal left common carotid artery, and with proximal aspiration during the angioplasty. Following the  angioplasty, the balloon was retrieved and removed while aspiration was maintained as flow arrest was reversed. Control arteriograms were then performed through the balloon guide catheter over the next 10-20 minutes continued to demonstrate excellent flow through  the centered in the proximal left ICA. Intracranially no change was seen in the TICI 3 revascularization of the left middle cerebral artery distribution. A 5 French diagnostic catheter was then advanced into the right common carotid artery over an 035 inch guidewire. The right common carotid artery injection demonstrated patency of the right internal carotid artery at the neck, and intracranially. The petrous, the cavernous and the supraclinoid right ICA demonstrate wide patency. The right middle cerebral artery and the right anterior cerebral artery demonstrate wide patency into the capillary and venous phases with a 50% stenosis of the right middle cerebral artery M1 segment. Cross-filling via the anterior communicating artery of the left anterior cerebral A2 segment, and the left M1 segment was evident. An 8 French Angio-Seal closure device was deployed at the right groin puncture site for hemostasis. Distal pulses remained present unchanged in both feet at the end of the procedure. Flat panel CT of the brain demonstrated no evidence of intracranial hemorrhage. Patient was extubated in was beginning to respond to simple commands appropriately slightly weaker on the right side. Medications utilized during the procedure. 81 mg of aspirin and Plavix 15 mg via orogastric tube prior to angioplasty. The patient was also given half bolus dose of cangrelor followed by a 4 hour half dose infusion to be followed by CT of the brain. Patient was then transferred to the neuro ICU for post revascularization care. 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 Underlying a significant atherosclerotic related stenosis treated with a 3 mm x 12 mm Onyx Frontier balloon mounted stent with significantly improved caliber and flow.  Status post stent assisted angioplasty of proximal left ICA stenosis with proximal flow arrest as described without event. PLAN: Follow-up in the clinic 4 weeks post discharge. Electronically Signed   By: Julieanne Cotton M.D.   On: 02/02/2023 07:58   IR INTRAVSC STENT CERV CAROTID W/O EMB-PROT MOD SED Result Date: 02/02/2023 INDICATION: History of repeated falls. Patient with new onset expressive aphasia and possible right sided weakness. 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 Also noted mid basilar artery occlusion. EXAM: 1. EMERGENT LARGE VESSEL OCCLUSION THROMBOLYSIS (anterior CIRCULATION) COMPARISON:  Recent CT angiogram of the head and neck, and MRI of the brain. MEDICATIONS: Ancef 2 g IV was administered within 1 hour of the procedure. ANESTHESIA/SEDATION: General anesthesia. CONTRAST:  Omnipaque 300 approximately 110 mL. FLUOROSCOPY TIME:  Fluoroscopy Time: 31 minutes 42 seconds (1110 mGy). COMPLICATIONS: None immediate. TECHNIQUE: Following a full explanation of the procedure along with the potential associated complications, an informed witnessed consent was obtained. The risks of intracranial hemorrhage of 10%, worsening neurological deficit, ventilator dependency, death and inability to revascularize were all reviewed in detail with the patient. The patient was then put under general anesthesia by the Department of Anesthesiology at [facility]. The right groin was prepped and draped in the usual sterile fashion. Thereafter using modified Seldinger technique, transfemoral access into the right common femoral artery was obtained without difficulty. Over an 0.035 inch guidewire an 8  French 25 cm Pinnacle sheath was inserted. Through this, and also over an 0.035 inch guidewire a combination of a 125 cm 6 [place] Berenstein support catheter inside of an 087 95 cm balloon guide catheter was advanced to the aortic  arch region and advanced to the distal left common carotid artery. The guidewire, and the support catheter were removed. Good aspiration was obtained from the hub of the balloon guide catheter. A control arteriogram was then performed centered extra cranially and intracranially. FINDINGS: The left common carotid arteriogram demonstrates the left external carotid artery and its major branches to be widely patent. The left internal carotid artery just distal to the bulb has a focal severe stenosis. 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 PROCEDURE: Through the balloon guide catheter in the distal left ICA, and over an 014 inch 300 cm exchange micro guidewire positioned in the horizontal petrous segment, control angioplasty of the proximal left internal carotid stenosis was performed with a 4 mm x 30 cm Viatrac 14 angioplasty balloon catheter which had been prepped with 50% contrast and 50% heparinized saline infusion. Using the rapid exchange technique, the balloon was positioned with its markers adequate distant from the site of severe stenosis. A control inflation was then performed using a micro inflation syringe device via micro tubing to approximately 4 mm where it was maintained for approximately a minute and a half. Thereafter, following deflation of the balloon and removal a control arteriogram performed through the balloon guide catheter demonstrated now improved caliber of the angioplastied segment. At this time a 071 Zoom aspiration catheter was advanced in combination with an 021 160 cm Trevo ProVue microcatheter over the exchange micro guidewire to the proximal petrous segment. The 300 cm 014 exchange micro guidewire was then exchanged for an 018 inch micro guidewire with a moderate J configuration distally. The micro guidewire with the microcatheter was  then gently advanced without difficulty through the occluded supraclinoid left ICA to the middle cerebral artery inferior division M2 segment followed by the microcatheter. The guidewire was removed. Good aspiration obtained from the hub of the microcatheter. A gentle control arteriogram performed through this demonstrated safe positioning of the tip of the microcatheter. A 4 mm x 40 mm Solitaire X retrieval device was then deployed with the proximal portion in the supraclinoid left ICA. At this time the Zoom aspiration catheter was advanced without difficulty to the mid M1 segment. Control aspiration was then performed for approximately a minute and a half at the hub of the Zoom aspiration catheter, and with a 20 mL syringe at the hub of the balloon guide catheter for approximately a minute and a half. The combination was then retrieved and removed. Following reversal of flow arrest in the left internal carotid artery a control arteriogram performed demonstrated complete revascularization of the supraclinoid left ICA with opacification of the left middle cerebral artery achieving a TICI 3 revascularization. Also noted was opacification of the proximal left A1 segment with focal areas of severe stenosis due to arteriosclerosis. Bilateral PCOMs demonstrated opacification into the left posterior cerebral artery P1 segment. Underlying intracranial arteriosclerosis with resulting severe stenosis was noted just proximal to the origins of the posterior communicating arteries. Through the balloon guide catheter in the distal left ICA, an aspiration 5 French 115 cm guide catheter was then advanced to the proximal  cavernous segment over an 035 inch guidewire. Through this, 160 cm Trevo ProVue microcatheter was advanced over an 018 inch micro guidewire. The microcatheter and the micro guidewire were advanced to the distal M2 M3 regions of the inferior division followed by the microcatheter. The guidewire was removed. Good  aspiration obtained from the hub of the microcatheter. This in turn was exchanged for an 014 inch 300 cm exchange micro guidewire with a moderate J configuration at its distal end. A control arteriogram performed demonstrated safe positioning of the tip of the micro guidewire. Measurements were then performed of the left ICA supraclinoid segment just proximal to the bifurcation and just distal to the origin of the ophthalmic artery. A 3 mm x 12 mm Onyx Frontier balloon mounted stent which had been prepped antegradely with 50% contrast and 50% heparinized saline infusion, and retrogradely with heparinized saline infusion, was advanced in a coaxial manner and with constant heparinized saline infusion and positioned without difficulty with the stent markers adequate distant from the area of severe stenosis. A control inflation was then performed using a micro inflation syringe device via micro tubing to just over 3 mm x 2 where it was maintained for approximately a minute and a half. Following balloon deflation and retrieval and removal, a control arteriogram performed through the 5 [place] intermediate catheter demonstrated significantly improved caliber of the supraclinoid left ICA with a TICI 3 revascularization of the left MCA being persistent. Again demonstrated was the stenosed left anterior cerebral A1 segment. Over the exchange micro guidewire now in the proximal cavernous segment, a control arteriogram performed through the balloon guide catheter following removal of the intermediate catheter. Measurements were then performed of the left internal carotid artery proximally at the site of the significant stenosis and the distal left common carotid artery. A 6/8 mm x 40 mm Xact stent delivery apparatus was then advanced in a coaxial manner and with constant heparinized saline infusion and positioned without difficulty with the adequate coverage distal to the angioplastied segment and into the distal left common  carotid artery. The stent was then deployed in the usual manner without any difficulty. The delivery apparatus was removed. A control arteriogram performed through the balloon guide catheter demonstrated significantly improved caliber through the stented segment of the proximal left ICA. The waist at the site of the angioplasty was then treated with a 5 mm x 30 mm Viatrac angioplasty microcatheter which was advanced after its preparation as described above. Prior to the angioplasty, proximal flow arrest was initiated by inflating the balloon in the distal left common carotid artery, and with proximal aspiration during the angioplasty. Following the angioplasty, the balloon was retrieved and removed while aspiration was maintained as flow arrest was reversed. Control arteriograms were then performed through the balloon guide catheter over the next 10-20 minutes continued to demonstrate excellent flow through the centered in the proximal left ICA. Intracranially no change was seen in the TICI 3 revascularization of the left middle cerebral artery distribution. A 5 French diagnostic catheter was then advanced into the right common carotid artery over an 035 inch guidewire. The right common carotid artery injection demonstrated patency of the right internal carotid artery at the neck, and intracranially. The petrous, the cavernous and the supraclinoid right ICA demonstrate wide patency. The right middle cerebral artery and the right anterior cerebral artery demonstrate wide patency into the capillary and venous phases with a 50% stenosis of the right middle cerebral artery M1 segment. Cross-filling via the anterior communicating  artery of the left anterior cerebral A2 segment, and the left M1 segment was evident. An 8 French Angio-Seal closure device was deployed at the right groin puncture site for hemostasis. Distal pulses remained present unchanged in both feet at the end of the procedure. Flat panel CT of the brain  demonstrated no evidence of intracranial hemorrhage. Patient was extubated in was beginning to respond to simple commands appropriately slightly weaker on the right side. Medications utilized during the procedure. 81 mg of aspirin and Plavix 15 mg via orogastric tube prior to angioplasty. The patient was also given half bolus dose of cangrelor followed by a 4 hour half dose infusion to be followed by CT of the brain. Patient was then transferred to the neuro ICU for post revascularization care. 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 Underlying a significant atherosclerotic related stenosis treated with a 3 mm x 12 mm Onyx Frontier balloon mounted stent with significantly improved caliber and flow. Status post stent assisted angioplasty of proximal left ICA stenosis with proximal flow arrest as described without event. PLAN: Follow-up in the clinic 4 weeks post discharge. Electronically Signed   By: Julieanne Cotton M.D.   On: 02/02/2023 07:58   IR Intra Cran Stent Result Date: 02/02/2023 INDICATION: History of repeated falls. Patient with new onset expressive aphasia and possible right sided weakness. 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 Also noted mid basilar artery occlusion. EXAM: 1. EMERGENT LARGE VESSEL OCCLUSION THROMBOLYSIS (anterior CIRCULATION) COMPARISON:  Recent CT angiogram of the head and neck, and MRI of the brain. MEDICATIONS: Ancef 2 g IV was administered within 1 hour of the procedure. ANESTHESIA/SEDATION: General anesthesia. CONTRAST:  Omnipaque 300 approximately 110 mL. FLUOROSCOPY TIME:  Fluoroscopy Time: 31 minutes 42 seconds (1110 mGy). COMPLICATIONS: None immediate. TECHNIQUE: Following a full  explanation of the procedure along with the potential associated complications, an informed witnessed consent was obtained. The risks of intracranial hemorrhage of 10%, worsening neurological deficit, ventilator dependency, death and inability to revascularize were all reviewed in detail with the patient. The patient was then put under general anesthesia by the Department of Anesthesiology at [facility]. The right groin was prepped and draped in the usual sterile fashion. Thereafter using modified Seldinger technique, transfemoral access into the right common femoral artery was obtained without difficulty. Over an 0.035 inch guidewire an 8 French 25 cm Pinnacle sheath was inserted. Through this, and also over an 0.035 inch guidewire a combination of a 125 cm 6 [place] Berenstein support catheter inside of an 087 95 cm balloon guide catheter was advanced to the aortic arch region and advanced to the distal left common carotid artery. The guidewire, and the support catheter were removed. Good aspiration was obtained from the hub of the balloon guide catheter. A control arteriogram was then performed centered extra cranially and intracranially. FINDINGS: The left common carotid arteriogram demonstrates the left external carotid artery and its major branches to be widely patent. The left internal carotid artery just distal to the bulb has a focal severe stenosis. 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  PROCEDURE: Through the balloon guide catheter in the distal left ICA, and over an 014 inch 300 cm exchange micro guidewire positioned in the horizontal petrous segment, control angioplasty of the proximal left internal carotid stenosis was performed with a 4 mm x 30 cm Viatrac 14 angioplasty balloon catheter which had been prepped with 50% contrast and 50%  heparinized saline infusion. Using the rapid exchange technique, the balloon was positioned with its markers adequate distant from the site of severe stenosis. A control inflation was then performed using a micro inflation syringe device via micro tubing to approximately 4 mm where it was maintained for approximately a minute and a half. Thereafter, following deflation of the balloon and removal a control arteriogram performed through the balloon guide catheter demonstrated now improved caliber of the angioplastied segment. At this time a 071 Zoom aspiration catheter was advanced in combination with an 021 160 cm Trevo ProVue microcatheter over the exchange micro guidewire to the proximal petrous segment. The 300 cm 014 exchange micro guidewire was then exchanged for an 018 inch micro guidewire with a moderate J configuration distally. The micro guidewire with the microcatheter was then gently advanced without difficulty through the occluded supraclinoid left ICA to the middle cerebral artery inferior division M2 segment followed by the microcatheter. The guidewire was removed. Good aspiration obtained from the hub of the microcatheter. A gentle control arteriogram performed through this demonstrated safe positioning of the tip of the microcatheter. A 4 mm x 40 mm Solitaire X retrieval device was then deployed with the proximal portion in the supraclinoid left ICA. At this time the Zoom aspiration catheter was advanced without difficulty to the mid M1 segment. Control aspiration was then performed for approximately a minute and a half at the hub of the Zoom aspiration catheter, and with a 20 mL syringe at the hub of the balloon guide catheter for approximately a minute and a half. The combination was then retrieved and removed. Following reversal of flow arrest in the left internal carotid artery a control arteriogram performed demonstrated complete revascularization of the supraclinoid left ICA with opacification  of the left middle cerebral artery achieving a TICI 3 revascularization. Also noted was opacification of the proximal left A1 segment with focal areas of severe stenosis due to arteriosclerosis. Bilateral PCOMs demonstrated opacification into the left posterior cerebral artery P1 segment. Underlying intracranial arteriosclerosis with resulting severe stenosis was noted just proximal to the origins of the posterior communicating arteries. Through the balloon guide catheter in the distal left ICA, an aspiration 5 French 115 cm guide catheter was then advanced to the proximal cavernous segment over an 035 inch guidewire. Through this, 160 cm Trevo ProVue microcatheter was advanced over an 018 inch micro guidewire. The microcatheter and the micro guidewire were advanced to the distal M2 M3 regions of the inferior division followed by the microcatheter. The guidewire was removed. Good aspiration obtained from the hub of the microcatheter. This in turn was exchanged for an 014 inch 300 cm exchange micro guidewire with a moderate J configuration at its distal end. A control arteriogram performed demonstrated safe positioning of the tip of the micro guidewire. Measurements were then performed of the left ICA supraclinoid segment just proximal to the bifurcation and just distal to the origin of the ophthalmic artery. A 3 mm x 12 mm Onyx Frontier balloon mounted stent which had been prepped antegradely with 50% contrast and 50% heparinized saline infusion, and retrogradely with heparinized saline infusion, was advanced in a  coaxial manner and with constant heparinized saline infusion and positioned without difficulty with the stent markers adequate distant from the area of severe stenosis. A control inflation was then performed using a micro inflation syringe device via micro tubing to just over 3 mm x 2 where it was maintained for approximately a minute and a half. Following balloon deflation and retrieval and removal, a  control arteriogram performed through the 5 [place] intermediate catheter demonstrated significantly improved caliber of the supraclinoid left ICA with a TICI 3 revascularization of the left MCA being persistent. Again demonstrated was the stenosed left anterior cerebral A1 segment. Over the exchange micro guidewire now in the proximal cavernous segment, a control arteriogram performed through the balloon guide catheter following removal of the intermediate catheter. Measurements were then performed of the left internal carotid artery proximally at the site of the significant stenosis and the distal left common carotid artery. A 6/8 mm x 40 mm Xact stent delivery apparatus was then advanced in a coaxial manner and with constant heparinized saline infusion and positioned without difficulty with the adequate coverage distal to the angioplastied segment and into the distal left common carotid artery. The stent was then deployed in the usual manner without any difficulty. The delivery apparatus was removed. A control arteriogram performed through the balloon guide catheter demonstrated significantly improved caliber through the stented segment of the proximal left ICA. The waist at the site of the angioplasty was then treated with a 5 mm x 30 mm Viatrac angioplasty microcatheter which was advanced after its preparation as described above. Prior to the angioplasty, proximal flow arrest was initiated by inflating the balloon in the distal left common carotid artery, and with proximal aspiration during the angioplasty. Following the angioplasty, the balloon was retrieved and removed while aspiration was maintained as flow arrest was reversed. Control arteriograms were then performed through the balloon guide catheter over the next 10-20 minutes continued to demonstrate excellent flow through the centered in the proximal left ICA. Intracranially no change was seen in the TICI 3 revascularization of the left middle cerebral  artery distribution. A 5 French diagnostic catheter was then advanced into the right common carotid artery over an 035 inch guidewire. The right common carotid artery injection demonstrated patency of the right internal carotid artery at the neck, and intracranially. The petrous, the cavernous and the supraclinoid right ICA demonstrate wide patency. The right middle cerebral artery and the right anterior cerebral artery demonstrate wide patency into the capillary and venous phases with a 50% stenosis of the right middle cerebral artery M1 segment. Cross-filling via the anterior communicating artery of the left anterior cerebral A2 segment, and the left M1 segment was evident. An 8 French Angio-Seal closure device was deployed at the right groin puncture site for hemostasis. Distal pulses remained present unchanged in both feet at the end of the procedure. Flat panel CT of the brain demonstrated no evidence of intracranial hemorrhage. Patient was extubated in was beginning to respond to simple commands appropriately slightly weaker on the right side. Medications utilized during the procedure. 81 mg of aspirin and Plavix 15 mg via orogastric tube prior to angioplasty. The patient was also given half bolus dose of cangrelor followed by a 4 hour half dose infusion to be followed by CT of the brain. Patient was then transferred to the neuro ICU for post revascularization care. IMPRESSION: Status post complete revascularization of occluded left internal carotid artery terminus with 1 pass with a 4 mm x 40  mm Solitaire X retrieval device, and contact aspiration achieving a TICI 3 revascularization of the left MCA distribution. Underlying a significant atherosclerotic related stenosis treated with a 3 mm x 12 mm Onyx Frontier balloon mounted stent with significantly improved caliber and flow. Status post stent assisted angioplasty of proximal left ICA stenosis with proximal flow arrest as described without event. PLAN:  Follow-up in the clinic 4 weeks post discharge. Electronically Signed   By: Julieanne Cotton M.D.   On: 02/02/2023 07:58   ECHOCARDIOGRAM COMPLETE Result Date: 02/01/2023    ECHOCARDIOGRAM REPORT   Patient Name:   KEVN TEATER Date of Exam: 02/01/2023 Medical Rec #:  [id number]          Height:       67.0 in Accession #:    [id number]         Weight:       119.3 lb Date of Birth:  [birth year]          BSA:          1.623 m Patient Age:    77 years           BP:           172/87 mmHg Patient Gender: M                  HR:           92 bpm. Exam Location:  Inpatient Procedure: 2D Echo, Cardiac Doppler and Color Doppler Indications:    Stroke  History:        Patient has prior history of Echocardiogram examinations, most                 recent 09/02/2019. Stroke; Risk Factors:Hypertension and                 Dyslipidemia.  Sonographer:    Milda Smart Referring Phys: [id number] [facility]  Sonographer Comments: Suboptimal parasternal window. Image acquisition challenging due to patient body habitus and Image acquisition challenging due to respiratory motion. IMPRESSIONS  1. Left ventricular ejection fraction, by estimation, is 60 to 65%. The left ventricle has normal function. The left ventricle has no regional wall motion abnormalities. Left ventricular diastolic parameters are consistent with Grade I diastolic dysfunction (impaired relaxation).  2. Right ventricular systolic function is normal. The right ventricular size is normal.  3. The mitral valve is abnormal. No evidence of mitral valve regurgitation. No evidence of mitral stenosis.  4. The aortic valve was not well visualized. There is mild calcification of the aortic valve. There is mild thickening of the aortic valve. Aortic valve regurgitation is not visualized. Aortic valve sclerosis is present, with no evidence of aortic valve  stenosis.  5. The inferior vena cava is normal in size with greater than 50% respiratory variability, suggesting  right atrial pressure of 3 mmHg. FINDINGS  Left Ventricle: Left ventricular ejection fraction, by estimation, is 60 to 65%. The left ventricle has normal function. The left ventricle has no regional wall motion abnormalities. The left ventricular internal cavity size was normal in size. There is  no left ventricular hypertrophy. Left ventricular diastolic parameters are consistent with Grade I diastolic dysfunction (impaired relaxation). Right Ventricle: The right ventricular size is normal. No increase in right ventricular wall thickness. Right ventricular systolic function is normal. Left Atrium: Left atrial size was normal in size. Right Atrium: Right atrial size was normal in size. Pericardium: There is no evidence of pericardial effusion. Mitral  Valve: The mitral valve is abnormal. There is mild thickening of the mitral valve leaflet(s). There is mild calcification of the mitral valve leaflet(s). Mild mitral annular calcification. No evidence of mitral valve regurgitation. No evidence of mitral valve stenosis. MV peak gradient, 5.8 mmHg. The mean mitral valve gradient is 3.0 mmHg. Tricuspid Valve: The tricuspid valve is normal in structure. Tricuspid valve regurgitation is not demonstrated. No evidence of tricuspid stenosis. Aortic Valve: The aortic valve was not well visualized. There is mild calcification of the aortic valve. There is mild thickening of the aortic valve. Aortic valve regurgitation is not visualized. Aortic valve sclerosis is present, with no evidence of aortic valve stenosis. Pulmonic Valve: The pulmonic valve was normal in structure. Pulmonic valve regurgitation is not visualized. No evidence of pulmonic stenosis. Aorta: The aortic root is normal in size and structure. Venous: The inferior vena cava is normal in size with greater than 50% respiratory variability, suggesting right atrial pressure of 3 mmHg. IAS/Shunts: No atrial level shunt detected by color flow Doppler.  LEFT VENTRICLE PLAX  2D LVIDd:         4.40 cm   Diastology LVIDs:         3.20 cm   LV e' medial:    6.20 cm/s LV PW:         0.80 cm   LV E/e' medial:  13.4 LV IVS:        0.60 cm   LV e' lateral:   7.94 cm/s LVOT diam:     2.50 cm   LV E/e' lateral: 10.5 LVOT Area:     4.91 cm  RIGHT VENTRICLE             IVC RV S prime:     17.80 cm/s  IVC diam: 1.40 cm TAPSE (M-mode): 2.5 cm LEFT ATRIUM             Index        RIGHT ATRIUM           Index LA diam:        3.30 cm 2.03 cm/m   RA Area:     11.70 cm LA Vol (A2C):   19.1 ml 11.77 ml/m  RA Volume:   26.80 ml  16.51 ml/m LA Vol (A4C):   27.2 ml 16.76 ml/m LA Biplane Vol: 23.8 ml 14.66 ml/m   AORTA Ao Root diam: 3.60 cm Ao Asc diam:  3.40 cm MITRAL VALVE MV Area (PHT): 1.73 cm     SHUNTS MV Peak grad:  5.8 mmHg     Systemic Diam: 2.50 cm MV Mean grad:  3.0 mmHg MV Vmax:       1.20 m/s MV Vmean:      83.8 cm/s MV Decel Time: 438 msec MV E velocity: 83.30 cm/s MV A velocity: 108.00 cm/s MV E/A ratio:  0.77 Charlton Haws MD Electronically signed by Charlton Haws MD Signature Date/Time: 02/01/2023/3:01:07 PM    Final    MR BRAIN WO CONTRAST Result Date: 02/01/2023 CLINICAL DATA:  Stroke and embolectomy. EXAM: MRI HEAD WITHOUT CONTRAST TECHNIQUE: Multiplanar, multiecho pulse sequences of the brain and surrounding structures were obtained without intravenous contrast. COMPARISON:  Head CT and MRI from yesterday FINDINGS: Brain: Moderate acute infarct in the left occipital cortex, increased from yesterday. Punctate acute infarct in the superior left frontal white matter since prior. There are areas of low-grade restricted diffusion in the left periatrial white matter and right occipital pole attributed to subacute ischemia.  Subarachnoid hemorrhage along the anterior left frontal convexity, distinct from any areas of acute infarction and known from prior head CT. Pre-existing bilateral cerebellar and brainstem small infarcts. Chronic lacunar infarcts in theright caudate. Ischemic gliosis  is mild. 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 Generalized brain atrophy. Vascular: No flow seen in the left vertebral artery. There is a basilar flow void. Skull and upper cervical spine: No focal marrow lesion Sinuses/Orbits: No acute finding IMPRESSION: 1. Moderate acute infarct in the left occipital lobe with mild progression from yesterday. Interval punctate acute infarct in the left frontal white matter. 2. Areas of chronic and subacute ischemia preferentially affecting the posterior circulation. 3. Known subarachnoid hemorrhage at the anterior left frontal convexity, stable from preceding head CT. Electronically Signed   By: Tiburcio Pea M.D.   On: 02/01/2023 04:58   CT HEAD WO CONTRAST ( ) Result Date: 01/31/2023 CLINICAL DATA:  Stroke, follow-up; post canrelor infusion EXAM: CT HEAD WITHOUT CONTRAST TECHNIQUE: Contiguous axial images were obtained from the base of the skull through the vertex without intravenous contrast. RADIATION DOSE REDUCTION: This exam was performed according to the departmental dose-optimization program which includes automated exposure control, adjustment of the mA and/or kV according to patient size and/or use of iterative reconstruction technique. COMPARISON:  CT head 10/01/2022 and intra procedural CT 10/01/2022 FINDINGS: Brain: Hyperdense material in the left frontal sulci (series 5, image 47 and 60 and series 3, images 15-18 and 25 area and series 3, image 25), which may represent subarachnoid hemorrhage versus contrast staining. This was not apparent on the immediate postprocedural CT head. Redemonstrated cytotoxic edema in the in the left occipital lobe (series 3, image 14), which is slightly more prominent than on the prior exam. More heterogeneous hypodensity in the right occipital lobe, which is favored to be chronic remote infarcts in the pons and bilateral cerebellar hemispheres. No evidence of acute infarction,  hemorrhage, mass, mass effect, or midline shift. No hydrocephalus or extra-axial fluid collection. Vascular: No hyperdense vessel. Atherosclerotic calcifications in the intracranial carotid and vertebral arteries. Skull: Negative for fracture or focal lesion. Sinuses/Orbits: Mucosal thickening in the ethmoid air cells. No acute finding in the orbits. Status post bilateral lens replacements. Other: The mastoid air cells are well aerated. IMPRESSION: 1. Hyperdense material in the left frontal sulci, which may represent subarachnoid hemorrhage versus contrast staining. This was not apparent on the immediate postprocedural CT head. Recommend short-term follow-up head CT. 2. Redemonstrated cytotoxic edema in the left occipital lobe, which is slightly more prominent than on the prior exam. These results will be called to the ordering clinician or representative by the Radiologist Assistant, and communication documented in the PACS or [employer]. Electronically Signed   By: Wiliam Ke M.D.   On: 01/31/2023 20:52   CT CEREBRAL PERFUSION W CONTRAST Result Date: 01/31/2023 CLINICAL DATA:  Code stroke. 77 year old male. CTA yesterday demonstrating left ICA terminus, distal left vertebral artery, Basilar artery occlusions. EXAM: CT PERFUSION BRAIN TECHNIQUE: Multiphase CT imaging of the brain was performed following IV bolus contrast injection. Subsequent parametric perfusion maps were calculated using RAPID software. RADIATION DOSE REDUCTION: This exam was performed according to the departmental dose-optimization program which includes automated exposure control, adjustment of the mA and/or kV according to patient size and/or use of iterative reconstruction technique. CONTRAST:  40mL OMNIPAQUE IOHEXOL 350 MG/ML SOLN COMPARISON:  Head CTs, CTA head and neck, and brain MRI earlier today. FINDINGS: CT Brain Perfusion Findings: CBF (<30%)  Volume: 0mL. No CBF or CBV parameter abnormality is detected. Perfusion  (Tmax>6.0s) volume: . Fairly symmetric abnormal T-max throughout the bilateral cerebellum, posterior cerebral hemispheres. T-max > 4 seconds is more pronounced in the left cerebral hemisphere including the left MCA territory. There is volume of minimal T-max > 10 seconds in the right posterior circulation. Mismatch Volume: Infarct Core: No infarct core is detected Infarction Location:Oligemia throughout the posterior circulation, to a lesser extent the left MCA territory. IMPRESSION: Oligemia throughout the bilateral posterior circulation, including the posterior fossa. Less pronounced oligemia in the Left MCA territory. No infarct core, CBF or CBV parameter abnormality detected by CTP. These results were communicated to Dr. Roda Shutters at 8:12 am on 01/31/2023 by text page via the [facility] messaging system. Electronically Signed   By: Odessa Fleming M.D.   On: 01/31/2023 08:12   CT HEAD CODE STROKE WO CONTRAST Result Date: 01/31/2023 CLINICAL DATA:  Code stroke. 77 year old male. CTA yesterday demonstrating left ICA terminus, distal left vertebral artery, Basilar artery occlusions. EXAM: CT HEAD WITHOUT CONTRAST TECHNIQUE: Contiguous axial images were obtained from the base of the skull through the vertex without intravenous contrast. RADIATION DOSE REDUCTION: This exam was performed according to the departmental dose-optimization program which includes automated exposure control, adjustment of the mA and/or kV according to patient size and/or use of iterative reconstruction technique. COMPARISON:  Brain MRI 0244 hours today. CTA head and neck 0056 hours today. Plain head CT 0050 hours today. FINDINGS: Brain: Left occipital pole cytotoxic edema is slightly more conspicuous on CT now. Heterogeneous right occipital pole redemonstrated. Chronic left central pontine and bilateral cerebellar infarcts on MRI appears stable by CT. No acute intracranial hemorrhage identified. No new cytotoxic edema compared to the earlier MRI.  No midline shift, mass effect, or evidence of intracranial mass lesion. No ventriculomegaly. Vascular: Extensive Calcified atherosclerosis at the skull base. See also CTA earlier today. Skull: Stable and intact. Sinuses/Orbits: Visualized paranasal sinuses and mastoids are clear. Other: No acute orbit or scalp soft tissue finding. ASPECTS [facility] Stroke Program Early CT Score) Total score (0-10 with 10 being normal): 10; occipital pole cytotoxic edema. IMPRESSION: 1. Stable non contrast CT appearance of the brain since 0050 hours today: Occipital pole Cytotoxic edema stable from the MRI at 0242 hours today. No new edema, no intracranial hemorrhage, or mass effect. 2. 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 Electronically Signed   By: Odessa Fleming M.D.   On: 01/31/2023 08:08   MR BRAIN WO CONTRAST Result Date: 01/31/2023 CLINICAL DATA:  Dizziness and gait instability. Large vessel occlusion. EXAM: MRI HEAD WITHOUT CONTRAST TECHNIQUE: Multiplanar, multiecho pulse sequences of the brain and surrounding structures were obtained without intravenous contrast. COMPARISON:  CTA head neck 01/31/2023 Brain MRI 09/01/2019 FINDINGS: Brain: There are areas of acute/early subacute ischemia within both occipital lobes, left-greater-than-right. These areas correspond to the areas of infarction seen on the earlier noncontrast head CT (suggested to be chronic at that time). There is no acute ischemia of the brainstem, cerebellum or anterior circulation. There is multifocal hyperintense T2-weighted signal within the white matter. Generalized volume loss. There is hyperintense T2-weighted signal that corresponds to both sites of infarction. There is a component of chronic infarct within the superior right occipital lobe (series 11, image 25). There are multiple old cerebellar infarcts. The midline structures are normal. Vascular: Abnormal distal basilar artery flow void in keeping with known occlusion. Abnormal flow  void at the skull base left ICA covers a longer  segment than the demonstrated occlusion on the earlier CTA, likely due to inhibited upstream flow. Skull and upper cervical spine: Normal calvarium and skull base. Visualized upper cervical spine and soft tissues are normal. Sinuses/Orbits:No paranasal sinus fluid levels or advanced mucosal thickening. No mastoid or middle ear effusion. Normal orbits. IMPRESSION: 1. Acute/early subacute infarcts within both occipital lobes, left-greater-than-right. 2. No brainstem, cerebellar or anterior circulation acute ischemia. 3. Abnormal skull base flow voids in keeping with known vascular and left ICA occlusions. 4. Old superior right occipital and bilateral cerebellar infarcts. Electronically Signed   By: Deatra Robinson M.D.   On: 01/31/2023 03:24   CT ANGIO HEAD NECK W WO CM (CODE STROKE) Addendum Date: 01/31/2023 ADDENDUM REPORT: 01/31/2023 02:45 ADDENDUM: In addition to the above described ICA occlusion, the distal basilar artery is occluded. There is reconstitution of the basilar tip with patency of both PCAs maintained. The superior cerebellar arteries are occluded at their origin. Please note that this addendum was previously mistakenly associated with the noncontrast head CT for this patient performed on the same day. Electronically Signed   By: Deatra Robinson M.D.   On: 01/31/2023 02:45   Result Date: 01/31/2023 CLINICAL DATA:  Fall with dysarthria and dizziness EXAM: CT ANGIOGRAPHY HEAD AND NECK WITH AND WITHOUT CONTRAST TECHNIQUE: Multidetector CT imaging of the head and neck was performed using the standard protocol during bolus administration of intravenous contrast. Multiplanar CT image reconstructions and MIPs were obtained to evaluate the vascular anatomy. Carotid stenosis measurements (when applicable) are obtained utilizing NASCET criteria, using the distal internal carotid diameter as the denominator. RADIATION DOSE REDUCTION: This exam was performed  according to the departmental dose-optimization program which includes automated exposure control, adjustment of the mA and/or kV according to patient size and/or use of iterative reconstruction technique. CONTRAST:  75mL OMNIPAQUE IOHEXOL 350 MG/ML SOLN COMPARISON:  None Available. FINDINGS: CTA NECK FINDINGS SKELETON: No acute abnormality or high grade bony spinal canal stenosis. OTHER NECK: Normal pharynx, larynx and major salivary glands. No cervical lymphadenopathy. Unremarkable thyroid gland. UPPER CHEST: No pneumothorax or pleural effusion. No nodules or masses. AORTIC ARCH: There is calcific atherosclerosis of the aortic arch. Conventional 3 vessel aortic branching pattern. RIGHT CAROTID SYSTEM: No dissection, occlusion or aneurysm. There is mixed density atherosclerosis extending into the proximal ICA, resulting in less than 50% stenosis. LEFT CAROTID SYSTEM: No dissection, occlusion or aneurysm. There is mixed density atherosclerosis extending into the proximal ICA, resulting in 60% stenosis. VERTEBRAL ARTERIES: Right dominant configuration. Diminutive left vertebral artery with probable short segment occlusion of the V3 segment. Normal right vertebral artery to the skull base. CTA HEAD FINDINGS POSTERIOR CIRCULATION: Right vertebral artery atherosclerosis with moderate stenosis. Normal left V4 segment. No proximal occlusion of the anterior or inferior cerebellar arteries. Basilar artery is normal. Superior cerebellar arteries are normal. Posterior cerebral arteries are normal. ANTERIOR CIRCULATION: The left ICA is occluded at its terminus within occluded length of approximately 9 mm. The right ICA is mildly atherosclerotic without stenosis. Anterior cerebral arteries are normal. Moderate multifocal stenosis of the M1 segment of the right MCA. The left MCA is patent, likely filled by collateral flow along the circle-of-Willis. There is multifocal mild atherosclerotic irregularity without high-grade  stenosis. VENOUS SINUSES: As permitted by contrast timing, patent. ANATOMIC VARIANTS: None Review of the MIP images confirms the above findings. IMPRESSION: 1. 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 The left MCA is patent,  likely filled by collateral flow along the circle-of-Willis. 2. Moderate multifocal stenosis of the M1 segment of the right MCA. 3. Moderate right vertebral artery stenosis. 4. Bilateral carotid bifurcation atherosclerosis with 60% stenosis of the proximal left ICA. 5. Diminutive left vertebral artery with probable short segment occlusion of the V3 segment. Aortic Atherosclerosis (ICD10-I70.0). Critical Value/emergent results were called by telephone at the time of interpretation on 01/31/2023 at 1:17 am to provider [facility] , who verbally acknowledged these results. Electronically Signed: By: Deatra Robinson M.D. 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 There is reconstitution of the basilar tip with patency of both PCAs maintained. The superior cerebellar arteries are occluded at their origin. Electronically Signed   By: Deatra Robinson M.D.   On: 01/31/2023 01:30   Result Date: 01/31/2023 CLINICAL DATA:  Code stroke.  Fall with dizziness EXAM: CT HEAD WITHOUT CONTRAST TECHNIQUE: Contiguous axial images were obtained from the base of the skull through the vertex without intravenous contrast. RADIATION DOSE REDUCTION: This exam was performed according to the departmental dose-optimization program which includes automated exposure control, adjustment of the mA and/or kV according to patient size and/or use of iterative reconstruction technique. COMPARISON:  None Available. FINDINGS: Brain: There is no mass, hemorrhage or extra-axial collection. The  size and configuration of the ventricles and extra-axial CSF spaces are normal. There are multiple old cerebellar small vessel infarcts. Bilateral PCA territory infarctions also appear chronic. There is mild white matter hypoattenuation. Vascular: Atherosclerosis of the carotid and vertebral arteries at the skull base. Skull: Normal Sinuses/Orbits: No fluid levels or advanced mucosal thickening of the visualized paranasal sinuses. No mastoid or middle ear effusion. The orbits are normal. ASPECTS [facility] Stroke Program Early CT Score) - Ganglionic level infarction (caudate, lentiform nuclei, internal capsule, insula, M1-M3 cortex): 7 - Supraganglionic infarction (M4-M6 cortex): 3 Total score (0-10 with 10 being normal): 10 IMPRESSION: 1. No acute intracranial abnormality. 2. ASPECTS is 10. 3. 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 4. Bilateral PCA territory infarctions also appear chronic. These results were called by telephone at the time of interpretation on 01/31/2023 at 1:00 am to provider [facility] , who verbally acknowledged these results. Electronically Signed: By: Deatra Robinson M.D. On: 01/31/2023 01:00   CT C-SPINE NO CHARGE Result Date: 01/31/2023 CLINICAL DATA:  Neck pain, known left ICA occlusion EXAM: CT CERVICAL SPINE WITHOUT CONTRAST TECHNIQUE: Multidetector CT imaging of the cervical spine was performed without intravenous contrast. Multiplanar CT image reconstructions were also generated. RADIATION DOSE REDUCTION: This exam was performed according to the departmental dose-optimization program which includes automated exposure control, adjustment of the mA and/or kV according to patient size and/or use of iterative reconstruction technique. COMPARISON:  None Available. FINDINGS: Alignment: Within normal limits. Skull base and vertebrae: 7 cervical segments are well visualized. Vertebral body height is well maintained. Mild osteophytic change and facet hypertrophic changes noted. No  acute fracture or acute facet abnormality is noted. The odontoid is within normal limits. Soft tissues and spinal canal: Surrounding soft tissue structures appear within normal limits. Upper chest: Visualized lung apices are unremarkable with the exception of some scarring in the right apex. Other: None IMPRESSION: Multilevel degenerative change without acute bony abnormality. Electronically Signed   By: Alcide Clever M.D.   On: 01/31/2023 01:25     PHYSICAL EXAM  Temp:  [  97.6 F (36.4 C)-98.8 F (37.1 C)] 98.8 F (37.1 C) (12/15 1158) Pulse Rate:  [63-85] 78 (12/15 1158) Resp:  [17-18] 18 (12/15 1158) BP: (112-138)/(62-79) 130/62 (12/15 1158) SpO2:  [96 %-98 %] 97 % (12/15 1158)  General - Well nourished, well developed, not in acute distress Watching TV.  Cardiovascular - Regular rhythm and rate..  Neuro -Awake and alert.  Paucity of speech but with moderate to severe dysarthria,  Follows command, will stick tongue out, lift arms. No gaze palsy, tracking bilaterally, right hemianopia, PERRL. No facial droop. Right facial dressing for wound from fall. Tongue midline. Bilateral UEs 4/5, no drift. Bilaterally LEs 3/5 proximal and 4/5 distally, no drift. Sensation symmetrical bilaterally, chronic left UE ataxia and left LE dysmetria, gait not tested.     ASSESSMENT/PLAN [name] is a 77 y.o. male with history of hypertension, hyperlipidemia, stroke admitted for multiple falls. No tPA given due to outside window.    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  Left ICA proximal 60% stenosis, right multifocal M1 moderate stenosis, right VA stenosis. MRI left MCA/PCA small to moderate infarct.   Old right PCA infarct. Due to developing expressive aphasia, repeat CT showed no acute change CTP no core infarct, increased TTP at posterior circulation and left MCA territory Status post IR with terminal ICA stenting and proximal ICA angioplasty with TICI3 reperfusion Post IR CT repeat left PCA progressive infarct. Hyperdense material in the left frontal sulci, which may represent subarachnoid hemorrhage versus contrast staining. MRI repeat Moderate acute infarct in the left occipital lobe with mild progression from yesterday. Interval punctate acute infarct in the left frontal white matter. 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 MRI repeat 12/12 showed Interval hemorrhagic transformation with a 2.7 x 1.5 cm hematoma at the left occipital lobe. Additional new acute intraparenchymal hemorrhage at the posterior left basal ganglia measuring 2.2 x 1.9 cm CT repeat done today and stable.  2D Echo EF 60 to 65% LDL 167 HgbA1c 5.1 UDS negative Lovenox for VTE prophylaxis No antithrombotic prior to admission, now on aspirin 81 mg daily and clopidogrel 75 mg daily for intracranial stenting.  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 Bilateral P-comm and PCA patent Avoid low BP No intervention needed at this time, discussed with Dr. Corliss Skains  History of stroke 08/2019 admitted for dizziness, imbalance and leaning towards to the left.  CT showed left cerebellar infarct which confirmed on MRI.  MRI showed left SCA occlusion, left VA hypoplastic.  2D echo unremarkable.  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  Discharged on DAPT and Lipitor. Follow-up with Dr. Pearlean Brownie at [facility]  Delirium, improving Less restless and agitation now Tele sitter is on at night On seroquel 25 bid ->  25 am and 50 hs  Hx of hypertension Hypotension post procedure Stable now S/p albumin Avoid low BP Long term BP goal normotensive  Hyperlipidemia Home meds: Zetia LDL 167, goal < 70 Statin intolerance (Lipitor and pravastatin) Continue Zetia at discharge Not candidate for [facility]  Other Stroke Risk Factors Advanced age Former smoker  Other Active Problems AKI, creatinine 1.40--1.06--1.07--1.21 --1.21--1.09, encourage po intake   Con't DAPT. CIR pending.  Total of 35 mins spent reviewing chart, discussion with patient  on prognosis, Dx and plan.  Reviewed Imaging personally.    To contact Stroke Continuity provider, please refer to [email or web address]. After hours, contact General Neurology

## 2023-02-08 NOTE — Progress Notes (Signed)
Physical Therapy Treatment Patient Details Name: Grant Berry MRN: [id number] DOB: August 10, 1945 Today's Date: 02/08/2023   History of Present Illness Pt is a 77 y.o. male who presented 01/31/23 s/p multiple falls.  MRI showed left MCA/PCA infarct, chronic right PCA infarct.  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 S/p bilat common carotid arteriograms R CFA approach, complete revascularization of L ICA supraclinoid segment, treated intracranial arteriosclerotic related stenosis, angioplasty of high grade L ICA proximal stenosis 12/8. PMH: L MCA CVA, HLD, HTN    PT Comments  Per RN, pt has been sleeping all day. Has only had morning seroquel per her report. Patient easily awakens and falls immediately back to sleep. Assisted to sit EOB with +2 max assist and pt then consistently staying awake. Able to work on sit to stand, however could not progress to marching or stepping due to pt lethargy. Pt returned to bed as too sleepy to safely be in the chair.     If plan is discharge home, recommend the following: A lot of help with bathing/dressing/bathroom;Assistance with cooking/housework;Assistance with feeding;Direct supervision/assist for medications management;Direct supervision/assist for financial management;Assist for transportation;Help with stairs or ramp for entrance;Supervision due to cognitive status;A lot of help with walking and/or transfers   Can travel by private vehicle     No  Equipment Recommendations  Other (comment) (tbd)    Recommendations for Other Services       Precautions / Restrictions Precautions Precautions: Fall Precaution Comments: h/o multiple falls; reports he can't see Restrictions Weight Bearing Restrictions Per Provider Order: No     Mobility  Bed Mobility Overal bed mobility: Needs Assistance Bed Mobility: Sidelying to Sit, Sit to Supine   Sidelying to sit:  Max assist, +2 for physical assistance   Sit to supine: Contact guard assist   General bed mobility comments: Physical assist to initiate bringing legs off EOB to transition to sitting. Assist to raise torso. Once EOB, slightly more awake/alert    Transfers Overall transfer level: Needs assistance Equipment used: Rolling walker (2 wheels), 2 person hand held assist Transfers: Sit to/from Stand Sit to Stand: Mod assist, +2 physical assistance           General transfer comment: from EOB to RW mod; repeated x 4 total    Ambulation/Gait               General Gait Details: pt unable to march in place or take side steps toward [facility]; pt making an effort to move each leg, but unable   Stairs             Wheelchair Mobility     Tilt Bed    Modified Rankin (Stroke Patients Only) Modified Rankin (Stroke Patients Only) Pre-Morbid Rankin Score: No significant disability Modified Rankin: Severe disability     Balance Overall balance assessment: Needs assistance, History of Falls Sitting-balance support: Bilateral upper extremity supported, Feet supported Sitting balance-Leahy Scale: Poor Sitting balance - Comments: sitting EOB, CGA provided for safety during balance   Standing balance support: Bilateral upper extremity supported Standing balance-Leahy Scale: Poor Standing balance comment: Reliant on support from OT to maintain standing balance. Pt demonstrated increased weight into heels while calves were using bed as support.                            Cognition Arousal: Lethargic Behavior During Therapy:  Anxious Overall Cognitive Status: No family/caregiver present to determine baseline cognitive functioning                     Current Attention Level: Focused   Following Commands: Follows one step commands with increased time, Follows one step commands inconsistently Safety/Judgement: Decreased awareness of safety, Decreased awareness of  deficits   Problem Solving: Slow processing, Decreased initiation, Difficulty sequencing, Requires verbal cues, Requires tactile cues General Comments: Pt dysarthric and had increased difficulty with communication. RN reports sleeping all day        Exercises      General Comments General comments (skin integrity, edema, etc.): RN reports pt has only had Seroquel today,  however has slept all day.      Pertinent Vitals/Pain Pain Assessment Pain Assessment: Faces Faces Pain Scale: No hurt    Home Living                          Prior Function            PT Goals (current goals can now be found in the care plan section) Acute Rehab PT Goals Patient Stated Goal: unable to state; lethargic Time For Goal Achievement: 02/15/23 Potential to Achieve Goals: Good Progress towards PT goals: Not progressing toward goals - comment (lethargy)    Frequency    Min 1X/week      PT Plan      Co-evaluation              AM-PAC PT "6 Clicks" Mobility   Outcome Measure  Help needed turning from your back to your side while in a flat bed without using bedrails?: A Lot Help needed moving from lying on your back to sitting on the side of a flat bed without using bedrails?: Total Help needed moving to and from a bed to a chair (including a wheelchair)?: Total Help needed standing up from a chair using your arms (e.g., wheelchair or bedside chair)?: Total Help needed to walk in hospital room?: Total Help needed climbing 3-5 steps with a railing? : Total 6 Click Score: 7    End of Session Equipment Utilized During Treatment: Gait belt Activity Tolerance: Patient limited by lethargy Patient left: with call bell/phone within reach;Other (comment);in bed;with bed alarm set (telesitter)   PT Visit Diagnosis: Other abnormalities of gait and mobility (R26.89);Muscle weakness (generalized) (M62.81)     Time: 1914-7829 PT Time Calculation (min) (ACUTE ONLY): 18  min  Charges:    $Gait Training: 8-22 mins PT General Charges $$ ACUTE PT VISIT: 1 Visit                      Jerolyn Center, PT Acute Rehabilitation Services  Office [phone]    Zena Amos 02/08/2023, 3:29 PM

## 2023-02-08 NOTE — Progress Notes (Signed)
SLP Cancellation Note  Patient Details Name: Grant Berry MRN: [id number] DOB: 1946-01-13   Cancelled treatment:       Reason Eval/Treat Not Completed: Fatigue/lethargy limiting ability to participate. Will f/u.    Gwynneth Aliment, M.A., CF-SLP Speech Language Pathology, Acute Rehabilitation Services  Secure Chat preferred [phone]  02/08/2023, 4:54 PM

## 2023-02-08 NOTE — Progress Notes (Addendum)
STROKE TEAM PROGRESS NOTE   SUBJECTIVE (INTERVAL HISTORY) Patient is seen in his room with no family at the bedside.  He has been hemodynamically stable and afebrile, and his neurological exam remains stable.  OBJECTIVE Temp:  [97.8 F (36.6 C)-99 F (37.2 C)] 98.6 F (37 C) (12/16 1137) Pulse Rate:  [79-93] 83 (12/16 1137) Cardiac Rhythm: Heart block;Bundle branch block (12/16 0813) Resp:  [17-18] 17 (12/16 1137) BP: (119-140)/(59-79) 134/70 (12/16 1137) SpO2:  [94 %-99 %] 99 % (12/16 1137)  No results for input(s): "GLUCAP" in the last 168 hours.  Recent Labs  Lab 02/02/23 0455 02/03/23 0604 02/04/23 0727 02/05/23 0624 02/06/23 0557  NA 136 136 136 138 135  K 3.4* 3.3* 3.5 3.5 3.9  CL 101 102 101 104 101  CO2 24 24 27 24 25   GLUCOSE 101* 104* 103* 98 98  BUN 14 12 16 14 13   CREATININE 1.07 1.21 1.21 1.09 1.05  CALCIUM 9.4 9.2 8.7* 8.8* 8.7*   No results for input(s): "AST", "ALT", "ALKPHOS", "BILITOT", "PROT", "ALBUMIN" in the last 168 hours.  Recent Labs  Lab 02/02/23 0455 02/03/23 0604 02/04/23 0727 02/05/23 0624 02/06/23 0557  WBC 8.4 8.8 7.5 5.6 6.1  HGB 11.0* 10.5* 10.3* 9.5* 9.2*  HCT 32.5* 30.9* 30.9* 28.9* 27.1*  MCV 88.3 88.3 90.1 89.2 88.9  PLT 165 168 188 190 208        Component Value Date/Time   CHOL 235 (H) 01/31/2023 0454   CHOL 202 (H) 07/10/2022 1012   CHOL 219 (H) 06/17/2012 1620   TRIG 47 01/31/2023 0454   TRIG 215 (H) 06/17/2012 1620   HDL 59 01/31/2023 0454   HDL 48 07/10/2022 1012   HDL 34 (L) 06/17/2012 1620   CHOLHDL 4.0 01/31/2023 0454   VLDL 9 01/31/2023 0454   LDLCALC 167 (H) 01/31/2023 0454   LDLCALC 134 (H) 07/10/2022 1012   LDLCALC 142 (H) 06/17/2012 1620   Lab Results  Component Value Date   HGBA1C 5.1 01/31/2023      Component Value Date/Time   LABOPIA NONE DETECTED 01/31/2023 0039   COCAINSCRNUR NONE DETECTED 01/31/2023 0039   LABBENZ NONE DETECTED 01/31/2023 0039   AMPHETMU NONE DETECTED 01/31/2023 0039    THCU NONE DETECTED 01/31/2023 0039   LABBARB NONE DETECTED 01/31/2023 0039    No results for input(s): "ETH" in the last 168 hours.   I have personally reviewed the radiological images below and agree with the radiology interpretations.  CT HEAD WO CONTRAST ( ) Result Date: 02/06/2023 CLINICAL DATA:  77 year old male code stroke presentation on 01/31/2023 with multiple intracranial occlusions. Status post endovascular reperfusion, stenting. Left lentiform hemorrhagic transformation in the left basal ganglia and occipital lobe. EXAM: CT HEAD WITHOUT CONTRAST TECHNIQUE: Contiguous axial images were obtained from the base of the skull through the vertex without intravenous contrast. RADIATION DOSE REDUCTION: This exam was performed according to the departmental dose-optimization program which includes automated exposure control, adjustment of the mA and/or kV according to patient size and/or use of iterative reconstruction technique. COMPARISON:  Brain MRI 02/04/2023. FINDINGS: Brain: Oval left lentiform hemorrhage is 25 x 17 by 32 mm (AP by transverse by CC) for estimated volume of 7 mL and appears mildly larger since the MRI on 02/04/2023. Regional hypodense edema. Oval left occipital pole cortical and subcortical hyperdense hemorrhage superimposed on confluent cytotoxic edema there is 19 x 42 x 14 mm (AP by transverse by CC) for an estimated blood volume of 6 mL, stable since  02/04/2023. No intraventricular blood. Trace additional left hemisphere subarachnoid hemorrhage suspected such as on sagittal image 43 left superior frontal gyrus. No midline shift or significant intracranial mass effect. Mild mass effect on the left lateral ventricle is stable. No ventriculomegaly. Underlying scattered chronic cerebral encephalomalacia including in the posterior fossa. No new cortically based infarct identified. Vascular: Distal left ICA stent. Calcified atherosclerosis at the skull base. Skull: Stable, intact.  Sinuses/Orbits: Visualized paranasal sinuses and mastoids are stable and well aerated. Other: No acute orbit or scalp soft tissue finding. IMPRESSION: 1. Left lentiform hemorrhage estimated at 7 mL appears slightly larger since 02/04/2023. Hemorrhagic transformation of the left PCA infarct estimated at 6 mL is stable. Trace left hemisphere subarachnoid hemorrhage suspected. 2. No midline shift or significant intracranial mass effect. No new intracranial abnormality. Multifocal chronic ischemic disease. Electronically Signed   By: Odessa Fleming M.D.   On: 02/06/2023 09:44   MR BRAIN WO CONTRAST Addendum Date: 02/04/2023 ADDENDUM REPORT: 02/04/2023 21:09 ADDENDUM: Results were communicated by telephone at the time of interpretation on 02/04/2023 at 9 p.m. to provider Dr. Otelia Limes, who verbally acknowledged these results. Electronically Signed   By: Rise Mu M.D.   On: 02/04/2023 21:09   Result Date: 02/04/2023 CLINICAL DATA:  Follow-up examination for stroke. EXAM: MRI HEAD WITHOUT CONTRAST TECHNIQUE: Multiplanar, multiecho pulse sequences of the brain and surrounding structures were obtained without intravenous contrast. COMPARISON:  Previous MRI from 02/01/2023 and CT from 01/31/2023 FINDINGS: Brain: Atrophy with chronic microvascular ischemic disease. Evolving late subacute right PCA distribution infarct involving the right occipital lobe. Multifocal chronic infarcts involving the left greater than right cerebellum. Few scattered remote lacunar infarcts about the basal ganglia and pons. Continued interval evolution of subacute left PCA distribution infarct, similar in size as compared to previous. Few additional patchy foci of all vein subacute ischemia noted superiorly within the subcortical posterior left frontal parietal region, also similar. There has been evidence for hemorrhagic transformation with a hematoma measuring 2.7 x 1.5 cm now seen at the left occipital lobe (series 14, image 22).  Additional new acute intraparenchymal hemorrhage at the posterior left basal ganglia measures approximately 2.2 x 1.9 cm (series 14, image 32). No significant regional mass effect. No intraventricular extension. Additional small volume subarachnoid hemorrhage at the anterior left frontal convexity, slightly less conspicuous as compared to prior. Additional scattered chronic micro hemorrhages noted, stable. No other new or interval infarction elsewhere. 3.7 cm benign arachnoid cyst at the left frontal vertex again noted. No other mass lesion or midline shift. No hydrocephalus. No extra-axial fluid collection. Vascular: Loss of normal flow void within the left vertebral artery, stable. Heterogeneous but preserved flow voids within the remaining posterior circulation. Normal flow voids seen within the anterior circulation. Skull and upper cervical spine: Craniocervical junction within normal limits. Bone marrow signal intensity normal. No scalp soft tissue abnormality. Sinuses/Orbits: Prior bilateral ocular lens replacement. Paranasal sinuses remain largely clear. No significant mastoid effusion. Other: 9 mm T2 hyperintense lesion within the right parotid gland (series 15, image 21), indeterminate. IMPRESSION: 1. Continued interval evolution of subacute left PCA distribution infarct, similar in size as compared to previous. Interval hemorrhagic transformation with a 2.7 x 1.5 cm hematoma at the left occipital lobe. Additional new acute intraparenchymal hemorrhage at the posterior left basal ganglia measuring 2.2 x 1.9 cm. No significant regional mass effect. No intraventricular extension. 2. Additional small volume subarachnoid hemorrhage at the anterior left frontal convexity, slightly less conspicuous as compared to prior. 3.  Evolving late subacute right PCA distribution infarct, stable. 4. Underlying atrophy with chronic microvascular ischemic disease, with multiple chronic infarcts as above. 5. 9 mm T2 hyperintense  lesion within the right parotid gland, indeterminate. Correlation with nonemergent outpatient ultrasound suggested for further evaluation. The covering neurohospitalist has been paged regarding these findings, currently awaiting a call back. Results will be conveyed as soon as possible. Electronically Signed: By: Rise Mu M.D. On: 02/04/2023 20:59   IR PERCUTANEOUS ART THROMBECTOMY/INFUSION INTRACRANIAL INC DIAG ANGIO Result Date: 02/02/2023 INDICATION: History of repeated falls. Patient with new onset expressive aphasia and possible right sided weakness. 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 Also noted mid basilar artery occlusion. EXAM: 1. EMERGENT LARGE VESSEL OCCLUSION THROMBOLYSIS (anterior CIRCULATION) COMPARISON:  Recent CT angiogram of the head and neck, and MRI of the brain. MEDICATIONS: Ancef 2 g IV was administered within 1 hour of the procedure. ANESTHESIA/SEDATION: General anesthesia. CONTRAST:  Omnipaque 300 approximately 110 mL. FLUOROSCOPY TIME:  Fluoroscopy Time: 31 minutes 42 seconds (1110 mGy). COMPLICATIONS: None immediate. TECHNIQUE: Following a full explanation of the procedure along with the potential associated complications, an informed witnessed consent was obtained. The risks of intracranial hemorrhage of 10%, worsening neurological deficit, ventilator dependency, death and inability to revascularize were all reviewed in detail with the patient. The patient was then put under general anesthesia by the Department of Anesthesiology at [facility]. The right groin was prepped and draped in the usual sterile fashion. Thereafter using modified Seldinger technique, transfemoral access into the right common femoral artery was obtained without difficulty. Over an 0.035 inch guidewire an 8 French 25 cm Pinnacle sheath was inserted. Through this, and also over an 0.035 inch guidewire a  combination of a 125 cm 6 [place] Berenstein support catheter inside of an 087 95 cm balloon guide catheter was advanced to the aortic arch region and advanced to the distal left common carotid artery. The guidewire, and the support catheter were removed. Good aspiration was obtained from the hub of the balloon guide catheter. A control arteriogram was then performed centered extra cranially and intracranially. FINDINGS: The left common carotid arteriogram demonstrates the left external carotid artery and its major branches to be widely patent. The left internal carotid artery just distal to the bulb has a focal severe stenosis. 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 PROCEDURE: Through the balloon guide catheter in the distal left ICA, and over an 014 inch 300 cm exchange micro guidewire positioned in the horizontal petrous segment, control angioplasty of the proximal left internal carotid stenosis was performed with a 4 mm x 30 cm Viatrac 14 angioplasty balloon catheter which had been prepped with 50% contrast and 50% heparinized saline infusion. Using the rapid exchange technique, the balloon was positioned with its markers adequate distant from the site of severe stenosis. A control inflation was then performed using a micro inflation syringe device via micro tubing to approximately 4 mm where it was maintained for approximately a minute and a half. Thereafter, following deflation of the balloon and removal a control arteriogram performed through the balloon guide catheter demonstrated now improved caliber of the angioplastied segment. At this time a 071 Zoom aspiration catheter was advanced in combination with an 021 160 cm Trevo ProVue microcatheter over  the exchange micro guidewire to the proximal petrous segment. The 300 cm 014 exchange micro guidewire was  then exchanged for an 018 inch micro guidewire with a moderate J configuration distally. The micro guidewire with the microcatheter was then gently advanced without difficulty through the occluded supraclinoid left ICA to the middle cerebral artery inferior division M2 segment followed by the microcatheter. The guidewire was removed. Good aspiration obtained from the hub of the microcatheter. A gentle control arteriogram performed through this demonstrated safe positioning of the tip of the microcatheter. A 4 mm x 40 mm Solitaire X retrieval device was then deployed with the proximal portion in the supraclinoid left ICA. At this time the Zoom aspiration catheter was advanced without difficulty to the mid M1 segment. Control aspiration was then performed for approximately a minute and a half at the hub of the Zoom aspiration catheter, and with a 20 mL syringe at the hub of the balloon guide catheter for approximately a minute and a half. The combination was then retrieved and removed. Following reversal of flow arrest in the left internal carotid artery a control arteriogram performed demonstrated complete revascularization of the supraclinoid left ICA with opacification of the left middle cerebral artery achieving a TICI 3 revascularization. Also noted was opacification of the proximal left A1 segment with focal areas of severe stenosis due to arteriosclerosis. Bilateral PCOMs demonstrated opacification into the left posterior cerebral artery P1 segment. Underlying intracranial arteriosclerosis with resulting severe stenosis was noted just proximal to the origins of the posterior communicating arteries. Through the balloon guide catheter in the distal left ICA, an aspiration 5 French 115 cm guide catheter was then advanced to the proximal cavernous segment over an 035 inch guidewire. Through this, 160 cm Trevo ProVue microcatheter was advanced over an 018 inch micro guidewire. The microcatheter and the micro  guidewire were advanced to the distal M2 M3 regions of the inferior division followed by the microcatheter. The guidewire was removed. Good aspiration obtained from the hub of the microcatheter. This in turn was exchanged for an 014 inch 300 cm exchange micro guidewire with a moderate J configuration at its distal end. A control arteriogram performed demonstrated safe positioning of the tip of the micro guidewire. Measurements were then performed of the left ICA supraclinoid segment just proximal to the bifurcation and just distal to the origin of the ophthalmic artery. A 3 mm x 12 mm Onyx Frontier balloon mounted stent which had been prepped antegradely with 50% contrast and 50% heparinized saline infusion, and retrogradely with heparinized saline infusion, was advanced in a coaxial manner and with constant heparinized saline infusion and positioned without difficulty with the stent markers adequate distant from the area of severe stenosis. A control inflation was then performed using a micro inflation syringe device via micro tubing to just over 3 mm x 2 where it was maintained for approximately a minute and a half. Following balloon deflation and retrieval and removal, a control arteriogram performed through the 5 [place] intermediate catheter demonstrated significantly improved caliber of the supraclinoid left ICA with a TICI 3 revascularization of the left MCA being persistent. Again demonstrated was the stenosed left anterior cerebral A1 segment. Over the exchange micro guidewire now in the proximal cavernous segment, a control arteriogram performed through the balloon guide catheter following removal of the intermediate catheter. Measurements were then performed of the left internal carotid artery proximally at the site of the significant stenosis and the distal left common carotid artery. A 6/8 mm  x 40 mm Xact stent delivery apparatus was then advanced in a coaxial manner and with constant heparinized saline  infusion and positioned without difficulty with the adequate coverage distal to the angioplastied segment and into the distal left common carotid artery. The stent was then deployed in the usual manner without any difficulty. The delivery apparatus was removed. A control arteriogram performed through the balloon guide catheter demonstrated significantly improved caliber through the stented segment of the proximal left ICA. The waist at the site of the angioplasty was then treated with a 5 mm x 30 mm Viatrac angioplasty microcatheter which was advanced after its preparation as described above. Prior to the angioplasty, proximal flow arrest was initiated by inflating the balloon in the distal left common carotid artery, and with proximal aspiration during the angioplasty. Following the angioplasty, the balloon was retrieved and removed while aspiration was maintained as flow arrest was reversed. Control arteriograms were then performed through the balloon guide catheter over the next 10-20 minutes continued to demonstrate excellent flow through the centered in the proximal left ICA. Intracranially no change was seen in the TICI 3 revascularization of the left middle cerebral artery distribution. A 5 French diagnostic catheter was then advanced into the right common carotid artery over an 035 inch guidewire. The right common carotid artery injection demonstrated patency of the right internal carotid artery at the neck, and intracranially. The petrous, the cavernous and the supraclinoid right ICA demonstrate wide patency. The right middle cerebral artery and the right anterior cerebral artery demonstrate wide patency into the capillary and venous phases with a 50% stenosis of the right middle cerebral artery M1 segment. Cross-filling via the anterior communicating artery of the left anterior cerebral A2 segment, and the left M1 segment was evident. An 8 French Angio-Seal closure device was deployed at the right groin  puncture site for hemostasis. Distal pulses remained present unchanged in both feet at the end of the procedure. Flat panel CT of the brain demonstrated no evidence of intracranial hemorrhage. Patient was extubated in was beginning to respond to simple commands appropriately slightly weaker on the right side. Medications utilized during the procedure. 81 mg of aspirin and Plavix 15 mg via orogastric tube prior to angioplasty. The patient was also given half bolus dose of cangrelor followed by a 4 hour half dose infusion to be followed by CT of the brain. Patient was then transferred to the neuro ICU for post revascularization care. 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 Underlying a significant atherosclerotic related stenosis treated with a 3 mm x 12 mm Onyx Frontier balloon mounted stent with significantly improved caliber and flow. Status post stent assisted angioplasty of proximal left ICA stenosis with proximal flow arrest as described without event. PLAN: Follow-up in the clinic 4 weeks post discharge. Electronically Signed   By: Julieanne Cotton M.D.   On: 02/02/2023 07:58   IR CT Head Ltd Result Date: 02/02/2023 INDICATION: History of repeated falls. Patient with new onset expressive aphasia and possible right sided weakness. 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 Also noted mid basilar artery occlusion. EXAM: 1. EMERGENT LARGE VESSEL OCCLUSION THROMBOLYSIS (anterior CIRCULATION) COMPARISON:  Recent CT angiogram of the head and neck, and MRI of the brain. MEDICATIONS:  Ancef 2 g IV was administered within 1 hour of the procedure. ANESTHESIA/SEDATION: General anesthesia. CONTRAST:  Omnipaque 300 approximately 110  mL. FLUOROSCOPY TIME:  Fluoroscopy Time: 31 minutes 42 seconds (1110 mGy). COMPLICATIONS: None immediate. TECHNIQUE: Following a full explanation of the procedure along with the potential associated complications, an informed witnessed consent was obtained. The risks of intracranial hemorrhage of 10%, worsening neurological deficit, ventilator dependency, death and inability to revascularize were all reviewed in detail with the patient. The patient was then put under general anesthesia by the Department of Anesthesiology at [facility]. The right groin was prepped and draped in the usual sterile fashion. Thereafter using modified Seldinger technique, transfemoral access into the right common femoral artery was obtained without difficulty. Over an 0.035 inch guidewire an 8 French 25 cm Pinnacle sheath was inserted. Through this, and also over an 0.035 inch guidewire a combination of a 125 cm 6 [place] Berenstein support catheter inside of an 087 95 cm balloon guide catheter was advanced to the aortic arch region and advanced to the distal left common carotid artery. The guidewire, and the support catheter were removed. Good aspiration was obtained from the hub of the balloon guide catheter. A control arteriogram was then performed centered extra cranially and intracranially. FINDINGS: The left common carotid arteriogram demonstrates the left external carotid artery and its major branches to be widely patent. The left internal carotid artery just distal to the bulb has a focal severe stenosis. 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 PROCEDURE: Through the balloon guide catheter in the distal left ICA, and over an 014 inch 300 cm exchange micro guidewire positioned in the horizontal petrous segment, control angioplasty of the proximal left internal carotid  stenosis was performed with a 4 mm x 30 cm Viatrac 14 angioplasty balloon catheter which had been prepped with 50% contrast and 50% heparinized saline infusion. Using the rapid exchange technique, the balloon was positioned with its markers adequate distant from the site of severe stenosis. A control inflation was then performed using a micro inflation syringe device via micro tubing to approximately 4 mm where it was maintained for approximately a minute and a half. Thereafter, following deflation of the balloon and removal a control arteriogram performed through the balloon guide catheter demonstrated now improved caliber of the angioplastied segment. At this time a 071 Zoom aspiration catheter was advanced in combination with an 021 160 cm Trevo ProVue microcatheter over the exchange micro guidewire to the proximal petrous segment. The 300 cm 014 exchange micro guidewire was then exchanged for an 018 inch micro guidewire with a moderate J configuration distally. The micro guidewire with the microcatheter was then gently advanced without difficulty through the occluded supraclinoid left ICA to the middle cerebral artery inferior division M2 segment followed by the microcatheter. The guidewire was removed. Good aspiration obtained from the hub of the microcatheter. A gentle control arteriogram performed through this demonstrated safe positioning of the tip of the microcatheter. A 4 mm x 40 mm Solitaire X retrieval device was then deployed with the proximal portion in the supraclinoid left ICA. At this time the Zoom aspiration catheter was advanced without difficulty to the mid M1 segment. Control aspiration was then performed for approximately a minute and a half at the hub of the Zoom aspiration catheter, and with a 20  mL syringe at the hub of the balloon guide catheter for approximately a minute and a half. The combination was then retrieved and removed. Following reversal of flow arrest in the left internal  carotid artery a control arteriogram performed demonstrated complete revascularization of the supraclinoid left ICA with opacification of the left middle cerebral artery achieving a TICI 3 revascularization. Also noted was opacification of the proximal left A1 segment with focal areas of severe stenosis due to arteriosclerosis. Bilateral PCOMs demonstrated opacification into the left posterior cerebral artery P1 segment. Underlying intracranial arteriosclerosis with resulting severe stenosis was noted just proximal to the origins of the posterior communicating arteries. Through the balloon guide catheter in the distal left ICA, an aspiration 5 French 115 cm guide catheter was then advanced to the proximal cavernous segment over an 035 inch guidewire. Through this, 160 cm Trevo ProVue microcatheter was advanced over an 018 inch micro guidewire. The microcatheter and the micro guidewire were advanced to the distal M2 M3 regions of the inferior division followed by the microcatheter. The guidewire was removed. Good aspiration obtained from the hub of the microcatheter. This in turn was exchanged for an 014 inch 300 cm exchange micro guidewire with a moderate J configuration at its distal end. A control arteriogram performed demonstrated safe positioning of the tip of the micro guidewire. Measurements were then performed of the left ICA supraclinoid segment just proximal to the bifurcation and just distal to the origin of the ophthalmic artery. A 3 mm x 12 mm Onyx Frontier balloon mounted stent which had been prepped antegradely with 50% contrast and 50% heparinized saline infusion, and retrogradely with heparinized saline infusion, was advanced in a coaxial manner and with constant heparinized saline infusion and positioned without difficulty with the stent markers adequate distant from the area of severe stenosis. A control inflation was then performed using a micro inflation syringe device via micro tubing to just  over 3 mm x 2 where it was maintained for approximately a minute and a half. Following balloon deflation and retrieval and removal, a control arteriogram performed through the 5 [place] intermediate catheter demonstrated significantly improved caliber of the supraclinoid left ICA with a TICI 3 revascularization of the left MCA being persistent. Again demonstrated was the stenosed left anterior cerebral A1 segment. Over the exchange micro guidewire now in the proximal cavernous segment, a control arteriogram performed through the balloon guide catheter following removal of the intermediate catheter. Measurements were then performed of the left internal carotid artery proximally at the site of the significant stenosis and the distal left common carotid artery. A 6/8 mm x 40 mm Xact stent delivery apparatus was then advanced in a coaxial manner and with constant heparinized saline infusion and positioned without difficulty with the adequate coverage distal to the angioplastied segment and into the distal left common carotid artery. The stent was then deployed in the usual manner without any difficulty. The delivery apparatus was removed. A control arteriogram performed through the balloon guide catheter demonstrated significantly improved caliber through the stented segment of the proximal left ICA. The waist at the site of the angioplasty was then treated with a 5 mm x 30 mm Viatrac angioplasty microcatheter which was advanced after its preparation as described above. Prior to the angioplasty, proximal flow arrest was initiated by inflating the balloon in the distal left common carotid artery, and with proximal aspiration during the angioplasty. Following the angioplasty, the balloon was retrieved and removed while aspiration was maintained as flow arrest  was reversed. Control arteriograms were then performed through the balloon guide catheter over the next 10-20 minutes continued to demonstrate excellent flow through  the centered in the proximal left ICA. Intracranially no change was seen in the TICI 3 revascularization of the left middle cerebral artery distribution. A 5 French diagnostic catheter was then advanced into the right common carotid artery over an 035 inch guidewire. The right common carotid artery injection demonstrated patency of the right internal carotid artery at the neck, and intracranially. The petrous, the cavernous and the supraclinoid right ICA demonstrate wide patency. The right middle cerebral artery and the right anterior cerebral artery demonstrate wide patency into the capillary and venous phases with a 50% stenosis of the right middle cerebral artery M1 segment. Cross-filling via the anterior communicating artery of the left anterior cerebral A2 segment, and the left M1 segment was evident. An 8 French Angio-Seal closure device was deployed at the right groin puncture site for hemostasis. Distal pulses remained present unchanged in both feet at the end of the procedure. Flat panel CT of the brain demonstrated no evidence of intracranial hemorrhage. Patient was extubated in was beginning to respond to simple commands appropriately slightly weaker on the right side. Medications utilized during the procedure. 81 mg of aspirin and Plavix 15 mg via orogastric tube prior to angioplasty. The patient was also given half bolus dose of cangrelor followed by a 4 hour half dose infusion to be followed by CT of the brain. Patient was then transferred to the neuro ICU for post revascularization care. 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 Underlying a significant atherosclerotic related stenosis treated with a 3 mm x 12 mm Onyx Frontier balloon mounted stent with significantly improved caliber and flow.  Status post stent assisted angioplasty of proximal left ICA stenosis with proximal flow arrest as described without event. PLAN: Follow-up in the clinic 4 weeks post discharge. Electronically Signed   By: Julieanne Cotton M.D.   On: 02/02/2023 07:58   IR INTRAVSC STENT CERV CAROTID W/O EMB-PROT MOD SED Result Date: 02/02/2023 INDICATION: History of repeated falls. Patient with new onset expressive aphasia and possible right sided weakness. 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 Also noted mid basilar artery occlusion. EXAM: 1. EMERGENT LARGE VESSEL OCCLUSION THROMBOLYSIS (anterior CIRCULATION) COMPARISON:  Recent CT angiogram of the head and neck, and MRI of the brain. MEDICATIONS: Ancef 2 g IV was administered within 1 hour of the procedure. ANESTHESIA/SEDATION: General anesthesia. CONTRAST:  Omnipaque 300 approximately 110 mL. FLUOROSCOPY TIME:  Fluoroscopy Time: 31 minutes 42 seconds (1110 mGy). COMPLICATIONS: None immediate. TECHNIQUE: Following a full explanation of the procedure along with the potential associated complications, an informed witnessed consent was obtained. The risks of intracranial hemorrhage of 10%, worsening neurological deficit, ventilator dependency, death and inability to revascularize were all reviewed in detail with the patient. The patient was then put under general anesthesia by the Department of Anesthesiology at [facility]. The right groin was prepped and draped in the usual sterile fashion. Thereafter using modified Seldinger technique, transfemoral access into the right common femoral artery was obtained without difficulty. Over an 0.035 inch guidewire an 8 French 25 cm Pinnacle sheath was inserted. Through this, and also over an 0.035  inch guidewire a combination of a 125 cm 6 [place] Berenstein support catheter inside of an 087 95 cm balloon guide catheter was advanced to the aortic  arch region and advanced to the distal left common carotid artery. The guidewire, and the support catheter were removed. Good aspiration was obtained from the hub of the balloon guide catheter. A control arteriogram was then performed centered extra cranially and intracranially. FINDINGS: The left common carotid arteriogram demonstrates the left external carotid artery and its major branches to be widely patent. The left internal carotid artery just distal to the bulb has a focal severe stenosis. 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 PROCEDURE: Through the balloon guide catheter in the distal left ICA, and over an 014 inch 300 cm exchange micro guidewire positioned in the horizontal petrous segment, control angioplasty of the proximal left internal carotid stenosis was performed with a 4 mm x 30 cm Viatrac 14 angioplasty balloon catheter which had been prepped with 50% contrast and 50% heparinized saline infusion. Using the rapid exchange technique, the balloon was positioned with its markers adequate distant from the site of severe stenosis. A control inflation was then performed using a micro inflation syringe device via micro tubing to approximately 4 mm where it was maintained for approximately a minute and a half. Thereafter, following deflation of the balloon and removal a control arteriogram performed through the balloon guide catheter demonstrated now improved caliber of the angioplastied segment. At this time a 071 Zoom aspiration catheter was advanced in combination with an 021 160 cm Trevo ProVue microcatheter over the exchange micro guidewire to the proximal petrous segment. The 300 cm 014 exchange micro guidewire was then exchanged for an 018 inch micro guidewire with a moderate J configuration distally. The micro guidewire with the microcatheter was  then gently advanced without difficulty through the occluded supraclinoid left ICA to the middle cerebral artery inferior division M2 segment followed by the microcatheter. The guidewire was removed. Good aspiration obtained from the hub of the microcatheter. A gentle control arteriogram performed through this demonstrated safe positioning of the tip of the microcatheter. A 4 mm x 40 mm Solitaire X retrieval device was then deployed with the proximal portion in the supraclinoid left ICA. At this time the Zoom aspiration catheter was advanced without difficulty to the mid M1 segment. Control aspiration was then performed for approximately a minute and a half at the hub of the Zoom aspiration catheter, and with a 20 mL syringe at the hub of the balloon guide catheter for approximately a minute and a half. The combination was then retrieved and removed. Following reversal of flow arrest in the left internal carotid artery a control arteriogram performed demonstrated complete revascularization of the supraclinoid left ICA with opacification of the left middle cerebral artery achieving a TICI 3 revascularization. Also noted was opacification of the proximal left A1 segment with focal areas of severe stenosis due to arteriosclerosis. Bilateral PCOMs demonstrated opacification into the left posterior cerebral artery P1 segment. Underlying intracranial arteriosclerosis with resulting severe stenosis was noted just proximal to the origins of the posterior communicating arteries. Through the balloon guide catheter in the distal left ICA, an aspiration 5 French 115 cm guide catheter was then advanced to the proximal cavernous segment over an 035 inch guidewire. Through this, 160 cm Trevo ProVue microcatheter  was advanced over an 018 inch micro guidewire. The microcatheter and the micro guidewire were advanced to the distal M2 M3 regions of the inferior division followed by the microcatheter. The guidewire was removed. Good  aspiration obtained from the hub of the microcatheter. This in turn was exchanged for an 014 inch 300 cm exchange micro guidewire with a moderate J configuration at its distal end. A control arteriogram performed demonstrated safe positioning of the tip of the micro guidewire. Measurements were then performed of the left ICA supraclinoid segment just proximal to the bifurcation and just distal to the origin of the ophthalmic artery. A 3 mm x 12 mm Onyx Frontier balloon mounted stent which had been prepped antegradely with 50% contrast and 50% heparinized saline infusion, and retrogradely with heparinized saline infusion, was advanced in a coaxial manner and with constant heparinized saline infusion and positioned without difficulty with the stent markers adequate distant from the area of severe stenosis. A control inflation was then performed using a micro inflation syringe device via micro tubing to just over 3 mm x 2 where it was maintained for approximately a minute and a half. Following balloon deflation and retrieval and removal, a control arteriogram performed through the 5 [place] intermediate catheter demonstrated significantly improved caliber of the supraclinoid left ICA with a TICI 3 revascularization of the left MCA being persistent. Again demonstrated was the stenosed left anterior cerebral A1 segment. Over the exchange micro guidewire now in the proximal cavernous segment, a control arteriogram performed through the balloon guide catheter following removal of the intermediate catheter. Measurements were then performed of the left internal carotid artery proximally at the site of the significant stenosis and the distal left common carotid artery. A 6/8 mm x 40 mm Xact stent delivery apparatus was then advanced in a coaxial manner and with constant heparinized saline infusion and positioned without difficulty with the adequate coverage distal to the angioplastied segment and into the distal left common  carotid artery. The stent was then deployed in the usual manner without any difficulty. The delivery apparatus was removed. A control arteriogram performed through the balloon guide catheter demonstrated significantly improved caliber through the stented segment of the proximal left ICA. The waist at the site of the angioplasty was then treated with a 5 mm x 30 mm Viatrac angioplasty microcatheter which was advanced after its preparation as described above. Prior to the angioplasty, proximal flow arrest was initiated by inflating the balloon in the distal left common carotid artery, and with proximal aspiration during the angioplasty. Following the angioplasty, the balloon was retrieved and removed while aspiration was maintained as flow arrest was reversed. Control arteriograms were then performed through the balloon guide catheter over the next 10-20 minutes continued to demonstrate excellent flow through the centered in the proximal left ICA. Intracranially no change was seen in the TICI 3 revascularization of the left middle cerebral artery distribution. A 5 French diagnostic catheter was then advanced into the right common carotid artery over an 035 inch guidewire. The right common carotid artery injection demonstrated patency of the right internal carotid artery at the neck, and intracranially. The petrous, the cavernous and the supraclinoid right ICA demonstrate wide patency. The right middle cerebral artery and the right anterior cerebral artery demonstrate wide patency into the capillary and venous phases with a 50% stenosis of the right middle cerebral artery M1 segment. Cross-filling via the anterior communicating artery of the left anterior cerebral A2 segment, and the left M1 segment was  evident. An 8 French Angio-Seal closure device was deployed at the right groin puncture site for hemostasis. Distal pulses remained present unchanged in both feet at the end of the procedure. Flat panel CT of the brain  demonstrated no evidence of intracranial hemorrhage. Patient was extubated in was beginning to respond to simple commands appropriately slightly weaker on the right side. Medications utilized during the procedure. 81 mg of aspirin and Plavix 15 mg via orogastric tube prior to angioplasty. The patient was also given half bolus dose of cangrelor followed by a 4 hour half dose infusion to be followed by CT of the brain. Patient was then transferred to the neuro ICU for post revascularization care. 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 Underlying a significant atherosclerotic related stenosis treated with a 3 mm x 12 mm Onyx Frontier balloon mounted stent with significantly improved caliber and flow. Status post stent assisted angioplasty of proximal left ICA stenosis with proximal flow arrest as described without event. PLAN: Follow-up in the clinic 4 weeks post discharge. Electronically Signed   By: Julieanne Cotton M.D.   On: 02/02/2023 07:58   IR Intra Cran Stent Result Date: 02/02/2023 INDICATION: History of repeated falls. Patient with new onset expressive aphasia and possible right sided weakness. 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 Also noted mid basilar artery occlusion. EXAM: 1. EMERGENT LARGE VESSEL OCCLUSION THROMBOLYSIS (anterior CIRCULATION) COMPARISON:  Recent CT angiogram of the head and neck, and MRI of the brain. MEDICATIONS: Ancef 2 g IV was administered within 1 hour of the procedure. ANESTHESIA/SEDATION: General anesthesia. CONTRAST:  Omnipaque 300 approximately 110 mL. FLUOROSCOPY TIME:  Fluoroscopy Time: 31 minutes 42 seconds (1110 mGy). COMPLICATIONS: None immediate. TECHNIQUE: Following a full  explanation of the procedure along with the potential associated complications, an informed witnessed consent was obtained. The risks of intracranial hemorrhage of 10%, worsening neurological deficit, ventilator dependency, death and inability to revascularize were all reviewed in detail with the patient. The patient was then put under general anesthesia by the Department of Anesthesiology at [facility]. The right groin was prepped and draped in the usual sterile fashion. Thereafter using modified Seldinger technique, transfemoral access into the right common femoral artery was obtained without difficulty. Over an 0.035 inch guidewire an 8 French 25 cm Pinnacle sheath was inserted. Through this, and also over an 0.035 inch guidewire a combination of a 125 cm 6 [place] Berenstein support catheter inside of an 087 95 cm balloon guide catheter was advanced to the aortic arch region and advanced to the distal left common carotid artery. The guidewire, and the support catheter were removed. Good aspiration was obtained from the hub of the balloon guide catheter. A control arteriogram was then performed centered extra cranially and intracranially. FINDINGS: The left common carotid arteriogram demonstrates the left external carotid artery and its major branches to be widely patent. The left internal carotid artery just distal to the bulb has a focal severe stenosis. 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 PROCEDURE: Through the balloon guide catheter in the distal left ICA, and over an  014 inch 300 cm exchange micro guidewire positioned in the horizontal petrous segment, control angioplasty of the proximal left internal carotid stenosis was performed with a 4 mm x 30 cm Viatrac 14 angioplasty balloon catheter which had been prepped with 50% contrast and 50%  heparinized saline infusion. Using the rapid exchange technique, the balloon was positioned with its markers adequate distant from the site of severe stenosis. A control inflation was then performed using a micro inflation syringe device via micro tubing to approximately 4 mm where it was maintained for approximately a minute and a half. Thereafter, following deflation of the balloon and removal a control arteriogram performed through the balloon guide catheter demonstrated now improved caliber of the angioplastied segment. At this time a 071 Zoom aspiration catheter was advanced in combination with an 021 160 cm Trevo ProVue microcatheter over the exchange micro guidewire to the proximal petrous segment. The 300 cm 014 exchange micro guidewire was then exchanged for an 018 inch micro guidewire with a moderate J configuration distally. The micro guidewire with the microcatheter was then gently advanced without difficulty through the occluded supraclinoid left ICA to the middle cerebral artery inferior division M2 segment followed by the microcatheter. The guidewire was removed. Good aspiration obtained from the hub of the microcatheter. A gentle control arteriogram performed through this demonstrated safe positioning of the tip of the microcatheter. A 4 mm x 40 mm Solitaire X retrieval device was then deployed with the proximal portion in the supraclinoid left ICA. At this time the Zoom aspiration catheter was advanced without difficulty to the mid M1 segment. Control aspiration was then performed for approximately a minute and a half at the hub of the Zoom aspiration catheter, and with a 20 mL syringe at the hub of the balloon guide catheter for approximately a minute and a half. The combination was then retrieved and removed. Following reversal of flow arrest in the left internal carotid artery a control arteriogram performed demonstrated complete revascularization of the supraclinoid left ICA with opacification  of the left middle cerebral artery achieving a TICI 3 revascularization. Also noted was opacification of the proximal left A1 segment with focal areas of severe stenosis due to arteriosclerosis. Bilateral PCOMs demonstrated opacification into the left posterior cerebral artery P1 segment. Underlying intracranial arteriosclerosis with resulting severe stenosis was noted just proximal to the origins of the posterior communicating arteries. Through the balloon guide catheter in the distal left ICA, an aspiration 5 French 115 cm guide catheter was then advanced to the proximal cavernous segment over an 035 inch guidewire. Through this, 160 cm Trevo ProVue microcatheter was advanced over an 018 inch micro guidewire. The microcatheter and the micro guidewire were advanced to the distal M2 M3 regions of the inferior division followed by the microcatheter. The guidewire was removed. Good aspiration obtained from the hub of the microcatheter. This in turn was exchanged for an 014 inch 300 cm exchange micro guidewire with a moderate J configuration at its distal end. A control arteriogram performed demonstrated safe positioning of the tip of the micro guidewire. Measurements were then performed of the left ICA supraclinoid segment just proximal to the bifurcation and just distal to the origin of the ophthalmic artery. A 3 mm x 12 mm Onyx Frontier balloon mounted stent which had been prepped antegradely with 50% contrast and 50% heparinized saline infusion, and retrogradely with heparinized saline infusion, was advanced in a coaxial manner and with constant heparinized saline infusion and positioned without difficulty with the  stent markers adequate distant from the area of severe stenosis. A control inflation was then performed using a micro inflation syringe device via micro tubing to just over 3 mm x 2 where it was maintained for approximately a minute and a half. Following balloon deflation and retrieval and removal, a  control arteriogram performed through the 5 [place] intermediate catheter demonstrated significantly improved caliber of the supraclinoid left ICA with a TICI 3 revascularization of the left MCA being persistent. Again demonstrated was the stenosed left anterior cerebral A1 segment. Over the exchange micro guidewire now in the proximal cavernous segment, a control arteriogram performed through the balloon guide catheter following removal of the intermediate catheter. Measurements were then performed of the left internal carotid artery proximally at the site of the significant stenosis and the distal left common carotid artery. A 6/8 mm x 40 mm Xact stent delivery apparatus was then advanced in a coaxial manner and with constant heparinized saline infusion and positioned without difficulty with the adequate coverage distal to the angioplastied segment and into the distal left common carotid artery. The stent was then deployed in the usual manner without any difficulty. The delivery apparatus was removed. A control arteriogram performed through the balloon guide catheter demonstrated significantly improved caliber through the stented segment of the proximal left ICA. The waist at the site of the angioplasty was then treated with a 5 mm x 30 mm Viatrac angioplasty microcatheter which was advanced after its preparation as described above. Prior to the angioplasty, proximal flow arrest was initiated by inflating the balloon in the distal left common carotid artery, and with proximal aspiration during the angioplasty. Following the angioplasty, the balloon was retrieved and removed while aspiration was maintained as flow arrest was reversed. Control arteriograms were then performed through the balloon guide catheter over the next 10-20 minutes continued to demonstrate excellent flow through the centered in the proximal left ICA. Intracranially no change was seen in the TICI 3 revascularization of the left middle cerebral  artery distribution. A 5 French diagnostic catheter was then advanced into the right common carotid artery over an 035 inch guidewire. The right common carotid artery injection demonstrated patency of the right internal carotid artery at the neck, and intracranially. The petrous, the cavernous and the supraclinoid right ICA demonstrate wide patency. The right middle cerebral artery and the right anterior cerebral artery demonstrate wide patency into the capillary and venous phases with a 50% stenosis of the right middle cerebral artery M1 segment. Cross-filling via the anterior communicating artery of the left anterior cerebral A2 segment, and the left M1 segment was evident. An 8 French Angio-Seal closure device was deployed at the right groin puncture site for hemostasis. Distal pulses remained present unchanged in both feet at the end of the procedure. Flat panel CT of the brain demonstrated no evidence of intracranial hemorrhage. Patient was extubated in was beginning to respond to simple commands appropriately slightly weaker on the right side. Medications utilized during the procedure. 81 mg of aspirin and Plavix 15 mg via orogastric tube prior to angioplasty. The patient was also given half bolus dose of cangrelor followed by a 4 hour half dose infusion to be followed by CT of the brain. Patient was then transferred to the neuro ICU for post revascularization care. IMPRESSION: Status post complete revascularization of occluded left internal carotid artery terminus with 1 pass with a 4 mm x 40 mm Solitaire X retrieval device, and contact aspiration achieving a TICI 3 revascularization of  the left MCA distribution. Underlying a significant atherosclerotic related stenosis treated with a 3 mm x 12 mm Onyx Frontier balloon mounted stent with significantly improved caliber and flow. Status post stent assisted angioplasty of proximal left ICA stenosis with proximal flow arrest as described without event. PLAN:  Follow-up in the clinic 4 weeks post discharge. Electronically Signed   By: Julieanne Cotton M.D.   On: 02/02/2023 07:58   ECHOCARDIOGRAM COMPLETE Result Date: 02/01/2023    ECHOCARDIOGRAM REPORT   Patient Name:   Grant SMIGIELSKI Date of Exam: 02/01/2023 Medical Rec #:  [id number]          Height:       67.0 in Accession #:    [id number]         Weight:       119.3 lb Date of Birth:  Dec 05, 1945          BSA:          1.623 m Patient Age:    77 years           BP:           172/87 mmHg Patient Gender: M                  HR:           92 bpm. Exam Location:  Inpatient Procedure: 2D Echo, Cardiac Doppler and Color Doppler Indications:    Stroke  History:        Patient has prior history of Echocardiogram examinations, most                 recent 09/02/2019. Stroke; Risk Factors:Hypertension and                 Dyslipidemia.  Sonographer:    Milda Smart Referring Phys: [id number] [facility]  Sonographer Comments: Suboptimal parasternal window. Image acquisition challenging due to patient body habitus and Image acquisition challenging due to respiratory motion. IMPRESSIONS  1. Left ventricular ejection fraction, by estimation, is 60 to 65%. The left ventricle has normal function. The left ventricle has no regional wall motion abnormalities. Left ventricular diastolic parameters are consistent with Grade I diastolic dysfunction (impaired relaxation).  2. Right ventricular systolic function is normal. The right ventricular size is normal.  3. The mitral valve is abnormal. No evidence of mitral valve regurgitation. No evidence of mitral stenosis.  4. The aortic valve was not well visualized. There is mild calcification of the aortic valve. There is mild thickening of the aortic valve. Aortic valve regurgitation is not visualized. Aortic valve sclerosis is present, with no evidence of aortic valve  stenosis.  5. The inferior vena cava is normal in size with greater than 50% respiratory variability, suggesting  right atrial pressure of 3 mmHg. FINDINGS  Left Ventricle: Left ventricular ejection fraction, by estimation, is 60 to 65%. The left ventricle has normal function. The left ventricle has no regional wall motion abnormalities. The left ventricular internal cavity size was normal in size. There is  no left ventricular hypertrophy. Left ventricular diastolic parameters are consistent with Grade I diastolic dysfunction (impaired relaxation). Right Ventricle: The right ventricular size is normal. No increase in right ventricular wall thickness. Right ventricular systolic function is normal. Left Atrium: Left atrial size was normal in size. Right Atrium: Right atrial size was normal in size. Pericardium: There is no evidence of pericardial effusion. Mitral Valve: The mitral valve is abnormal. There is mild thickening of the mitral valve  leaflet(s). There is mild calcification of the mitral valve leaflet(s). Mild mitral annular calcification. No evidence of mitral valve regurgitation. No evidence of mitral valve stenosis. MV peak gradient, 5.8 mmHg. The mean mitral valve gradient is 3.0 mmHg. Tricuspid Valve: The tricuspid valve is normal in structure. Tricuspid valve regurgitation is not demonstrated. No evidence of tricuspid stenosis. Aortic Valve: The aortic valve was not well visualized. There is mild calcification of the aortic valve. There is mild thickening of the aortic valve. Aortic valve regurgitation is not visualized. Aortic valve sclerosis is present, with no evidence of aortic valve stenosis. Pulmonic Valve: The pulmonic valve was normal in structure. Pulmonic valve regurgitation is not visualized. No evidence of pulmonic stenosis. Aorta: The aortic root is normal in size and structure. Venous: The inferior vena cava is normal in size with greater than 50% respiratory variability, suggesting right atrial pressure of 3 mmHg. IAS/Shunts: No atrial level shunt detected by color flow Doppler.  LEFT VENTRICLE PLAX  2D LVIDd:         4.40 cm   Diastology LVIDs:         3.20 cm   LV e' medial:    6.20 cm/s LV PW:         0.80 cm   LV E/e' medial:  13.4 LV IVS:        0.60 cm   LV e' lateral:   7.94 cm/s LVOT diam:     2.50 cm   LV E/e' lateral: 10.5 LVOT Area:     4.91 cm  RIGHT VENTRICLE             IVC RV S prime:     17.80 cm/s  IVC diam: 1.40 cm TAPSE (M-mode): 2.5 cm LEFT ATRIUM             Index        RIGHT ATRIUM           Index LA diam:        3.30 cm 2.03 cm/m   RA Area:     11.70 cm LA Vol (A2C):   19.1 ml 11.77 ml/m  RA Volume:   26.80 ml  16.51 ml/m LA Vol (A4C):   27.2 ml 16.76 ml/m LA Biplane Vol: 23.8 ml 14.66 ml/m   AORTA Ao Root diam: 3.60 cm Ao Asc diam:  3.40 cm MITRAL VALVE MV Area (PHT): 1.73 cm     SHUNTS MV Peak grad:  5.8 mmHg     Systemic Diam: 2.50 cm MV Mean grad:  3.0 mmHg MV Vmax:       1.20 m/s MV Vmean:      83.8 cm/s MV Decel Time: 438 msec MV E velocity: 83.30 cm/s MV A velocity: 108.00 cm/s MV E/A ratio:  0.77 Charlton Haws MD Electronically signed by Charlton Haws MD Signature Date/Time: 02/01/2023/3:01:07 PM    Final    MR BRAIN WO CONTRAST Result Date: 02/01/2023 CLINICAL DATA:  Stroke and embolectomy. EXAM: MRI HEAD WITHOUT CONTRAST TECHNIQUE: Multiplanar, multiecho pulse sequences of the brain and surrounding structures were obtained without intravenous contrast. COMPARISON:  Head CT and MRI from yesterday FINDINGS: Brain: Moderate acute infarct in the left occipital cortex, increased from yesterday. Punctate acute infarct in the superior left frontal white matter since prior. There are areas of low-grade restricted diffusion in the left periatrial white matter and right occipital pole attributed to subacute ischemia. Subarachnoid hemorrhage along the anterior left frontal convexity, distinct from any areas of acute  infarction and known from prior head CT. Pre-existing bilateral cerebellar and brainstem small infarcts. Chronic lacunar infarcts in theright caudate. Ischemic gliosis  is mild. 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 Generalized brain atrophy. Vascular: No flow seen in the left vertebral artery. There is a basilar flow void. Skull and upper cervical spine: No focal marrow lesion Sinuses/Orbits: No acute finding IMPRESSION: 1. Moderate acute infarct in the left occipital lobe with mild progression from yesterday. Interval punctate acute infarct in the left frontal white matter. 2. Areas of chronic and subacute ischemia preferentially affecting the posterior circulation. 3. Known subarachnoid hemorrhage at the anterior left frontal convexity, stable from preceding head CT. Electronically Signed   By: Tiburcio Pea M.D.   On: 02/01/2023 04:58   CT HEAD WO CONTRAST ( ) Result Date: 01/31/2023 CLINICAL DATA:  Stroke, follow-up; post canrelor infusion EXAM: CT HEAD WITHOUT CONTRAST TECHNIQUE: Contiguous axial images were obtained from the base of the skull through the vertex without intravenous contrast. RADIATION DOSE REDUCTION: This exam was performed according to the departmental dose-optimization program which includes automated exposure control, adjustment of the mA and/or kV according to patient size and/or use of iterative reconstruction technique. COMPARISON:  CT head 10/01/2022 and intra procedural CT 10/01/2022 FINDINGS: Brain: Hyperdense material in the left frontal sulci (series 5, image 47 and 60 and series 3, images 15-18 and 25 area and series 3, image 25), which may represent subarachnoid hemorrhage versus contrast staining. This was not apparent on the immediate postprocedural CT head. Redemonstrated cytotoxic edema in the in the left occipital lobe (series 3, image 14), which is slightly more prominent than on the prior exam. More heterogeneous hypodensity in the right occipital lobe, which is favored to be chronic remote infarcts in the pons and bilateral cerebellar hemispheres. No evidence of acute infarction,  hemorrhage, mass, mass effect, or midline shift. No hydrocephalus or extra-axial fluid collection. Vascular: No hyperdense vessel. Atherosclerotic calcifications in the intracranial carotid and vertebral arteries. Skull: Negative for fracture or focal lesion. Sinuses/Orbits: Mucosal thickening in the ethmoid air cells. No acute finding in the orbits. Status post bilateral lens replacements. Other: The mastoid air cells are well aerated. IMPRESSION: 1. Hyperdense material in the left frontal sulci, which may represent subarachnoid hemorrhage versus contrast staining. This was not apparent on the immediate postprocedural CT head. Recommend short-term follow-up head CT. 2. Redemonstrated cytotoxic edema in the left occipital lobe, which is slightly more prominent than on the prior exam. These results will be called to the ordering clinician or representative by the Radiologist Assistant, and communication documented in the PACS or [employer]. Electronically Signed   By: Wiliam Ke M.D.   On: 01/31/2023 20:52   CT CEREBRAL PERFUSION W CONTRAST Result Date: 01/31/2023 CLINICAL DATA:  Code stroke. 77 year old male. CTA yesterday demonstrating left ICA terminus, distal left vertebral artery, Basilar artery occlusions. EXAM: CT PERFUSION BRAIN TECHNIQUE: Multiphase CT imaging of the brain was performed following IV bolus contrast injection. Subsequent parametric perfusion maps were calculated using RAPID software. RADIATION DOSE REDUCTION: This exam was performed according to the departmental dose-optimization program which includes automated exposure control, adjustment of the mA and/or kV according to patient size and/or use of iterative reconstruction technique. CONTRAST:  40mL OMNIPAQUE IOHEXOL 350 MG/ML SOLN COMPARISON:  Head CTs, CTA head and neck, and brain MRI earlier today. FINDINGS: CT Brain Perfusion Findings: CBF (<30%) Volume: 0mL. No CBF or CBV parameter abnormality is detected. Perfusion  (Tmax>6.0s) volume:  . Fairly symmetric abnormal T-max throughout the bilateral cerebellum, posterior cerebral hemispheres. T-max > 4 seconds is more pronounced in the left cerebral hemisphere including the left MCA territory. There is volume of minimal T-max > 10 seconds in the right posterior circulation. Mismatch Volume: Infarct Core: No infarct core is detected Infarction Location:Oligemia throughout the posterior circulation, to a lesser extent the left MCA territory. IMPRESSION: Oligemia throughout the bilateral posterior circulation, including the posterior fossa. Less pronounced oligemia in the Left MCA territory. No infarct core, CBF or CBV parameter abnormality detected by CTP. These results were communicated to Dr. Roda Shutters at 8:12 am on 01/31/2023 by text page via the [facility] messaging system. Electronically Signed   By: Odessa Fleming M.D.   On: 01/31/2023 08:12   CT HEAD CODE STROKE WO CONTRAST Result Date: 01/31/2023 CLINICAL DATA:  Code stroke. 77 year old male. CTA yesterday demonstrating left ICA terminus, distal left vertebral artery, Basilar artery occlusions. EXAM: CT HEAD WITHOUT CONTRAST TECHNIQUE: Contiguous axial images were obtained from the base of the skull through the vertex without intravenous contrast. RADIATION DOSE REDUCTION: This exam was performed according to the departmental dose-optimization program which includes automated exposure control, adjustment of the mA and/or kV according to patient size and/or use of iterative reconstruction technique. COMPARISON:  Brain MRI 0244 hours today. CTA head and neck 0056 hours today. Plain head CT 0050 hours today. FINDINGS: Brain: Left occipital pole cytotoxic edema is slightly more conspicuous on CT now. Heterogeneous right occipital pole redemonstrated. Chronic left central pontine and bilateral cerebellar infarcts on MRI appears stable by CT. No acute intracranial hemorrhage identified. No new cytotoxic edema compared to the earlier MRI.  No midline shift, mass effect, or evidence of intracranial mass lesion. No ventriculomegaly. Vascular: Extensive Calcified atherosclerosis at the skull base. See also CTA earlier today. Skull: Stable and intact. Sinuses/Orbits: Visualized paranasal sinuses and mastoids are clear. Other: No acute orbit or scalp soft tissue finding. ASPECTS [facility] Stroke Program Early CT Score) Total score (0-10 with 10 being normal): 10; occipital pole cytotoxic edema. IMPRESSION: 1. Stable non contrast CT appearance of the brain since 0050 hours today: Occipital pole Cytotoxic edema stable from the MRI at 0242 hours today. No new edema, no intracranial hemorrhage, or mass effect. 2. 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 Electronically Signed   By: Odessa Fleming M.D.   On: 01/31/2023 08:08   MR BRAIN WO CONTRAST Result Date: 01/31/2023 CLINICAL DATA:  Dizziness and gait instability. Large vessel occlusion. EXAM: MRI HEAD WITHOUT CONTRAST TECHNIQUE: Multiplanar, multiecho pulse sequences of the brain and surrounding structures were obtained without intravenous contrast. COMPARISON:  CTA head neck 01/31/2023 Brain MRI 09/01/2019 FINDINGS: Brain: There are areas of acute/early subacute ischemia within both occipital lobes, left-greater-than-right. These areas correspond to the areas of infarction seen on the earlier noncontrast head CT (suggested to be chronic at that time). There is no acute ischemia of the brainstem, cerebellum or anterior circulation. There is multifocal hyperintense T2-weighted signal within the white matter. Generalized volume loss. There is hyperintense T2-weighted signal that corresponds to both sites of infarction. There is a component of chronic infarct within the superior right occipital lobe (series 11, image 25). There are multiple old cerebellar infarcts. The midline structures are normal. Vascular: Abnormal distal basilar artery flow void in keeping with known occlusion. Abnormal flow  void at the skull base left ICA covers a longer segment than the demonstrated occlusion on the earlier CTA, likely due to inhibited upstream  flow. Skull and upper cervical spine: Normal calvarium and skull base. Visualized upper cervical spine and soft tissues are normal. Sinuses/Orbits:No paranasal sinus fluid levels or advanced mucosal thickening. No mastoid or middle ear effusion. Normal orbits. IMPRESSION: 1. Acute/early subacute infarcts within both occipital lobes, left-greater-than-right. 2. No brainstem, cerebellar or anterior circulation acute ischemia. 3. Abnormal skull base flow voids in keeping with known vascular and left ICA occlusions. 4. Old superior right occipital and bilateral cerebellar infarcts. Electronically Signed   By: Deatra Robinson M.D.   On: 01/31/2023 03:24   CT ANGIO HEAD NECK W WO CM (CODE STROKE) Addendum Date: 01/31/2023 ADDENDUM REPORT: 01/31/2023 02:45 ADDENDUM: In addition to the above described ICA occlusion, the distal basilar artery is occluded. There is reconstitution of the basilar tip with patency of both PCAs maintained. The superior cerebellar arteries are occluded at their origin. Please note that this addendum was previously mistakenly associated with the noncontrast head CT for this patient performed on the same day. Electronically Signed   By: Deatra Robinson M.D.   On: 01/31/2023 02:45   Result Date: 01/31/2023 CLINICAL DATA:  Fall with dysarthria and dizziness EXAM: CT ANGIOGRAPHY HEAD AND NECK WITH AND WITHOUT CONTRAST TECHNIQUE: Multidetector CT imaging of the head and neck was performed using the standard protocol during bolus administration of intravenous contrast. Multiplanar CT image reconstructions and MIPs were obtained to evaluate the vascular anatomy. Carotid stenosis measurements (when applicable) are obtained utilizing NASCET criteria, using the distal internal carotid diameter as the denominator. RADIATION DOSE REDUCTION: This exam was performed  according to the departmental dose-optimization program which includes automated exposure control, adjustment of the mA and/or kV according to patient size and/or use of iterative reconstruction technique. CONTRAST:  75mL OMNIPAQUE IOHEXOL 350 MG/ML SOLN COMPARISON:  None Available. FINDINGS: CTA NECK FINDINGS SKELETON: No acute abnormality or high grade bony spinal canal stenosis. OTHER NECK: Normal pharynx, larynx and major salivary glands. No cervical lymphadenopathy. Unremarkable thyroid gland. UPPER CHEST: No pneumothorax or pleural effusion. No nodules or masses. AORTIC ARCH: There is calcific atherosclerosis of the aortic arch. Conventional 3 vessel aortic branching pattern. RIGHT CAROTID SYSTEM: No dissection, occlusion or aneurysm. There is mixed density atherosclerosis extending into the proximal ICA, resulting in less than 50% stenosis. LEFT CAROTID SYSTEM: No dissection, occlusion or aneurysm. There is mixed density atherosclerosis extending into the proximal ICA, resulting in 60% stenosis. VERTEBRAL ARTERIES: Right dominant configuration. Diminutive left vertebral artery with probable short segment occlusion of the V3 segment. Normal right vertebral artery to the skull base. CTA HEAD FINDINGS POSTERIOR CIRCULATION: Right vertebral artery atherosclerosis with moderate stenosis. Normal left V4 segment. No proximal occlusion of the anterior or inferior cerebellar arteries. Basilar artery is normal. Superior cerebellar arteries are normal. Posterior cerebral arteries are normal. ANTERIOR CIRCULATION: The left ICA is occluded at its terminus within occluded length of approximately 9 mm. The right ICA is mildly atherosclerotic without stenosis. Anterior cerebral arteries are normal. Moderate multifocal stenosis of the M1 segment of the right MCA. The left MCA is patent, likely filled by collateral flow along the circle-of-Willis. There is multifocal mild atherosclerotic irregularity without high-grade  stenosis. VENOUS SINUSES: As permitted by contrast timing, patent. ANATOMIC VARIANTS: None Review of the MIP images confirms the above findings. IMPRESSION: 1. 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 The left MCA is patent, likely filled by collateral flow along the circle-of-Willis. 2. Moderate multifocal stenosis of the  M1 segment of the right MCA. 3. Moderate right vertebral artery stenosis. 4. Bilateral carotid bifurcation atherosclerosis with 60% stenosis of the proximal left ICA. 5. Diminutive left vertebral artery with probable short segment occlusion of the V3 segment. Aortic Atherosclerosis (ICD10-I70.0). Critical Value/emergent results were called by telephone at the time of interpretation on 01/31/2023 at 1:17 am to provider [facility] , who verbally acknowledged these results. Electronically Signed: By: Deatra Robinson M.D. 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 There is reconstitution of the basilar tip with patency of both PCAs maintained. The superior cerebellar arteries are occluded at their origin. Electronically Signed   By: Deatra Robinson M.D.   On: 01/31/2023 01:30   Result Date: 01/31/2023 CLINICAL DATA:  Code stroke.  Fall with dizziness EXAM: CT HEAD WITHOUT CONTRAST TECHNIQUE: Contiguous axial images were obtained from the base of the skull through the vertex without intravenous contrast. RADIATION DOSE REDUCTION: This exam was performed according to the departmental dose-optimization program which includes automated exposure control, adjustment of the mA and/or kV according to patient size and/or use of iterative reconstruction technique. COMPARISON:  None Available. FINDINGS: Brain: There is no mass, hemorrhage or extra-axial collection. The  size and configuration of the ventricles and extra-axial CSF spaces are normal. There are multiple old cerebellar small vessel infarcts. Bilateral PCA territory infarctions also appear chronic. There is mild white matter hypoattenuation. Vascular: Atherosclerosis of the carotid and vertebral arteries at the skull base. Skull: Normal Sinuses/Orbits: No fluid levels or advanced mucosal thickening of the visualized paranasal sinuses. No mastoid or middle ear effusion. The orbits are normal. ASPECTS [facility] Stroke Program Early CT Score) - Ganglionic level infarction (caudate, lentiform nuclei, internal capsule, insula, M1-M3 cortex): 7 - Supraganglionic infarction (M4-M6 cortex): 3 Total score (0-10 with 10 being normal): 10 IMPRESSION: 1. No acute intracranial abnormality. 2. ASPECTS is 10. 3. 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 4. Bilateral PCA territory infarctions also appear chronic. These results were called by telephone at the time of interpretation on 01/31/2023 at 1:00 am to provider [facility] , who verbally acknowledged these results. Electronically Signed: By: Deatra Robinson M.D. On: 01/31/2023 01:00   CT C-SPINE NO CHARGE Result Date: 01/31/2023 CLINICAL DATA:  Neck pain, known left ICA occlusion EXAM: CT CERVICAL SPINE WITHOUT CONTRAST TECHNIQUE: Multidetector CT imaging of the cervical spine was performed without intravenous contrast. Multiplanar CT image reconstructions were also generated. RADIATION DOSE REDUCTION: This exam was performed according to the departmental dose-optimization program which includes automated exposure control, adjustment of the mA and/or kV according to patient size and/or use of iterative reconstruction technique. COMPARISON:  None Available. FINDINGS: Alignment: Within normal limits. Skull base and vertebrae: 7 cervical segments are well visualized. Vertebral body height is well maintained. Mild osteophytic change and facet hypertrophic changes noted. No  acute fracture or acute facet abnormality is noted. The odontoid is within normal limits. Soft tissues and spinal canal: Surrounding soft tissue structures appear within normal limits. Upper chest: Visualized lung apices are unremarkable with the exception of some scarring in the right apex. Other: None IMPRESSION: Multilevel degenerative change without acute bony abnormality. Electronically Signed   By: Alcide Clever M.D.   On: 01/31/2023 01:25     PHYSICAL EXAM  Temp:  [97.8 F (36.6 C)-99 F (37.2 C)] 98.6 F (37 C) (12/16 1137)  Pulse Rate:  [79-93] 83 (12/16 1137) Resp:  [17-18] 17 (12/16 1137) BP: (119-140)/(59-79) 134/70 (12/16 1137) SpO2:  [94 %-99 %] 99 % (12/16 1137)  General -well-nourished, well-developed elderly patient in no acute distress Cardiovascular - Regular rhythm and rate..  Neuro -Awake and alert.  Patient will respond to name and intermittently follow commands.  He has left gaze preference but can look to the right past midline he blinks to threat on the right but not on the left.  He has an apparent left hemianopsia. He will move all 4 extremities with antigravity strength.  ASSESSMENT/PLAN [name] is a 77 y.o. male with history of hypertension, hyperlipidemia, stroke admitted for multiple falls. No tPA given due to outside window.    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  Left ICA proximal 60% stenosis, right multifocal M1 moderate stenosis, right VA stenosis. MRI left MCA/PCA small to moderate infarct.  Old right PCA infarct. Due to developing expressive aphasia, repeat CT showed no acute change CTP no core infarct, increased TTP at posterior circulation and  left MCA territory Status post IR with terminal ICA stenting and proximal ICA angioplasty with TICI3 reperfusion Post IR CT repeat left PCA progressive infarct. Hyperdense material in the left frontal sulci, which may represent subarachnoid hemorrhage versus contrast staining. MRI repeat Moderate acute infarct in the left occipital lobe with mild progression from yesterday. Interval punctate acute infarct in the left frontal white matter. 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 MRI repeat 12/12 showed Interval hemorrhagic transformation with a 2.7 x 1.5 cm hematoma at the left occipital lobe. Additional new acute intraparenchymal hemorrhage at the posterior left basal ganglia measuring 2.2 x 1.9 cm CT repeat done 12/14 and stable.  2D Echo EF 60 to 65% LDL 167 HgbA1c 5.1 UDS negative Lovenox for VTE prophylaxis No antithrombotic prior to admission, now on aspirin 81 mg daily and clopidogrel 75 mg daily for intracranial stenting.  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 Bilateral P-comm and PCA patent Avoid low BP No intervention needed at this time, discussed with Dr. Corliss Skains  History of stroke 08/2019 admitted for dizziness, imbalance and leaning towards to the left.  CT showed left cerebellar infarct which confirmed on MRI.  MRI showed left SCA occlusion, left VA hypoplastic.  2D echo unremarkable.  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  Discharged on DAPT and Lipitor. Follow-up with Dr. Pearlean Brownie at [facility]  Delirium, improving Less restless and agitation now Tele sitter is on at night On seroquel 25 bid -> 25 am and 50 hs  Hx of hypertension Hypotension post procedure Stable now S/p albumin Avoid low BP Long term BP goal normotensive  Hyperlipidemia Home  meds: Zetia LDL 167, goal < 70 Statin intolerance (Lipitor and pravastatin) Continue Zetia at discharge Not candidate for Leqvio   Other Stroke Risk Factors Advanced age Former smoker  Other Active Problems AKI, creatinine 1.40--1.06--1.07--1.21 --1.21--1.09, encourage po intake  Patient seen by NP with MD, MD to edit note as needed. Cortney  E Ernestina Columbia , MSN, AGACNP-BC [facility] See Amion for schedule and pager information 02/08/2023 12:31 PM   I have personally obtained history,examined this patient, reviewed notes, independently viewed imaging studies, participated in medical decision making and plan of care.ROS completed by me personally and pertinent positives fully documented  I have made any additions or clarifications directly to the above note. Agree with note above.  Continue aspirin and Plavix for intracranial stent.  Continue ongoing therapies.  Medically stable to be transferred to rehab when bed available.  No family at bedside.  Delia Heady, MD Medical Director [facility] Pager: [phone] 02/08/2023 12:45 PM

## 2023-02-08 NOTE — Plan of Care (Signed)
  Problem: Health Behavior/Discharge Planning: Goal: Goals will be collaboratively established with patient/family Outcome: Progressing   Problem: Cardiovascular: Goal: Ability to achieve and maintain adequate cardiovascular perfusion will improve Outcome: Progressing Goal: Vascular access site(s) Level 0-1 will be maintained Outcome: Progressing   Problem: Clinical Measurements: Goal: Ability to maintain clinical measurements within normal limits will improve Outcome: Progressing Goal: Will remain free from infection Outcome: Progressing Goal: Diagnostic test results will improve Outcome: Progressing Goal: Respiratory complications will improve Outcome: Progressing Goal: Cardiovascular complication will be avoided Outcome: Progressing   Problem: Coping: Goal: Level of anxiety will decrease Outcome: Progressing   Problem: Pain Management: Goal: General experience of comfort will improve Outcome: Progressing   Problem: Safety: Goal: Ability to remain free from injury will improve Outcome: Progressing   Problem: Skin Integrity: Goal: Risk for impaired skin integrity will decrease Outcome: Progressing

## 2023-02-08 NOTE — TOC Progression Note (Signed)
Transition of Care [facility]) - Progression Note    Patient Details  Name: Grant Berry MRN: [id number] Date of Birth: 1945/07/16  Transition of [facility] CM/SW Contact  Baldemar Lenis, [place] Phone Number: 02/08/2023, 10:53 AM  Clinical Narrative:   CSW spoke with Admissions at [facility], where patient's son works. [facility] [place] hopeful to admit patient when medically ready but concerned that patient cannot be without a sitter, asked for clarification on why the sitter is needed. CSW updated on patient behaviors but that MD has been making medication adjustments. [facility] [place] hopeful to admit patient when he can be without sitter. CSW to follow.    Expected Discharge Plan: Skilled Nursing Facility Barriers to Discharge: Continued Medical Work up, English as a second language teacher, Facility will not accept until restraint criteria met  Expected Discharge Plan and Services     Post Acute Care Choice: Skilled Nursing Facility Living arrangements for the past 2 months: Single Family Home                                       Social Determinants of Health (SDOH) Interventions SDOH Screenings   Food Insecurity: No Food Insecurity (01/31/2023)  Housing: Low Risk  (02/04/2023)  Transportation Needs: No Transportation Needs (01/31/2023)  Utilities: Not At Risk (01/31/2023)  Alcohol Screen: Low Risk  (08/03/2022)  Depression (PHQ2-9): Low Risk  (10/23/2022)  Financial Resource Strain: Low Risk  (08/03/2022)  Physical Activity: Insufficiently Active (08/03/2022)  Social Connections: Socially Isolated (08/03/2022)  Stress: No Stress Concern Present (08/03/2022)  Tobacco Use: Medium Risk (01/31/2023)    Readmission Risk Interventions     No data to display

## 2023-02-08 NOTE — Plan of Care (Signed)
   Problem: Education: Goal: Knowledge of disease or condition will improve Outcome: Progressing Goal: Knowledge of secondary prevention will improve (MUST DOCUMENT ALL) Outcome: Progressing Goal: Knowledge of patient specific risk factors will improve Loraine Leriche N/A or DELETE if not current risk factor) Outcome: Progressing   Problem: Ischemic Stroke/TIA Tissue Perfusion: Goal: Complications of ischemic stroke/TIA will be minimized Outcome: Progressing   Problem: Coping: Goal: Will verbalize positive feelings about self Outcome: Progressing Goal: Will identify appropriate support needs Outcome: Progressing   Problem: Health Behavior/Discharge Planning: Goal: Ability to manage health-related needs will improve Outcome: Progressing Goal: Goals will be collaboratively established with patient/family Outcome: Progressing   Problem: Self-Care: Goal: Ability to participate in self-care as condition permits will improve Outcome: Progressing Goal: Verbalization of feelings and concerns over difficulty with self-care will improve Outcome: Progressing Goal: Ability to communicate needs accurately will improve Outcome: Progressing   Problem: Nutrition: Goal: Risk of aspiration will decrease Outcome: Progressing Goal: Dietary intake will improve Outcome: Progressing   Problem: Education: Goal: Understanding of CV disease, CV risk reduction, and recovery process will improve Outcome: Progressing Goal: Individualized Educational Video(s) Outcome: Progressing   Problem: Activity: Goal: Ability to return to baseline activity level will improve Outcome: Progressing   Problem: Cardiovascular: Goal: Ability to achieve and maintain adequate cardiovascular perfusion will improve Outcome: Progressing Goal: Vascular access site(s) Level 0-1 will be maintained Outcome: Progressing   Problem: Health Behavior/Discharge Planning: Goal: Ability to safely manage health-related needs after  discharge will improve Outcome: Progressing   Problem: Education: Goal: Knowledge of General Education information will improve Description: Including pain rating scale, medication(s)/side effects and non-pharmacologic comfort measures Outcome: Progressing   Problem: Health Behavior/Discharge Planning: Goal: Ability to manage health-related needs will improve Outcome: Progressing   Problem: Clinical Measurements: Goal: Ability to maintain clinical measurements within normal limits will improve Outcome: Progressing Goal: Will remain free from infection Outcome: Progressing Goal: Diagnostic test results will improve Outcome: Progressing Goal: Respiratory complications will improve Outcome: Progressing Goal: Cardiovascular complication will be avoided Outcome: Progressing   Problem: Activity: Goal: Risk for activity intolerance will decrease Outcome: Progressing   Problem: Nutrition: Goal: Adequate nutrition will be maintained Outcome: Progressing   Problem: Coping: Goal: Level of anxiety will decrease Outcome: Progressing   Problem: Elimination: Goal: Will not experience complications related to bowel motility Outcome: Progressing Goal: Will not experience complications related to urinary retention Outcome: Progressing   Problem: Pain Management: Goal: General experience of comfort will improve Outcome: Progressing   Problem: Safety: Goal: Ability to remain free from injury will improve Outcome: Progressing   Problem: Skin Integrity: Goal: Risk for impaired skin integrity will decrease Outcome: Progressing

## 2023-02-09 LAB — CBC
HCT: 30.1 % — ABNORMAL LOW (ref 39.0–52.0)
Hemoglobin: 9.9 g/dL — ABNORMAL LOW (ref 13.0–17.0)
MCH: 29.6 pg (ref 26.0–34.0)
MCHC: 32.9 g/dL (ref 30.0–36.0)
MCV: 89.9 fL (ref 80.0–100.0)
Platelets: 319 10*3/uL (ref 150–400)
RBC: 3.35 MIL/uL — ABNORMAL LOW (ref 4.22–5.81)
RDW: 13.7 % (ref 11.5–15.5)
WBC: 8.4 10*3/uL (ref 4.0–10.5)
nRBC: 0 % (ref 0.0–0.2)

## 2023-02-09 LAB — BASIC METABOLIC PANEL
Anion gap: 7 (ref 5–15)
BUN: 18 mg/dL (ref 8–23)
CO2: 26 mmol/L (ref 22–32)
Calcium: 8.7 mg/dL — ABNORMAL LOW (ref 8.9–10.3)
Chloride: 101 mmol/L (ref 98–111)
Creatinine, Ser: 1.14 mg/dL (ref 0.61–1.24)
GFR, Estimated: >60 mL/min (ref >60)
Glucose, Bld: 106 mg/dL — ABNORMAL HIGH (ref 70–99)
Potassium: 4.1 mmol/L (ref 3.5–5.1)
Sodium: 134 mmol/L — ABNORMAL LOW (ref 135–145)

## 2023-02-09 NOTE — Progress Notes (Addendum)
STROKE TEAM PROGRESS NOTE   SUBJECTIVE (INTERVAL HISTORY) Patient is seen in his room with no family at the bedside.  His neurological exam is stable, and he is hemodynamically stable and afebrile.  OBJECTIVE Temp:  [98.1 F (36.7 C)-100 F (37.8 C)] 98.1 F (36.7 C) (12/17 0835) Pulse Rate:  [78-87] 86 (12/17 0835) Cardiac Rhythm: Normal sinus rhythm;Heart block (12/17 0700) Resp:  [18-20] 18 (12/17 0835) BP: (121-140)/(62-83) 130/78 (12/17 0835) SpO2:  [93 %-97 %] 93 % (12/17 0835)  No results for input(s): "GLUCAP" in the last 168 hours.  Recent Labs  Lab 02/03/23 0604 02/04/23 0727 02/05/23 0624 02/06/23 0557 02/09/23 0538  NA 136 136 138 135 134*  K 3.3* 3.5 3.5 3.9 4.1  CL 102 101 104 101 101  CO2 24 27 24 25 26   GLUCOSE 104* 103* 98 98 106*  BUN 12 16 14 13 18   CREATININE 1.21 1.21 1.09 1.05 1.14  CALCIUM 9.2 8.7* 8.8* 8.7* 8.7*   No results for input(s): "AST", "ALT", "ALKPHOS", "BILITOT", "PROT", "ALBUMIN" in the last 168 hours.  Recent Labs  Lab 02/03/23 0604 02/04/23 0727 02/05/23 0624 02/06/23 0557 02/09/23 0538  WBC 8.8 7.5 5.6 6.1 8.4  HGB 10.5* 10.3* 9.5* 9.2* 9.9*  HCT 30.9* 30.9* 28.9* 27.1* 30.1*  MCV 88.3 90.1 89.2 88.9 89.9  PLT 168 188 190 208 319        Component Value Date/Time   CHOL 235 (H) 01/31/2023 0454   CHOL 202 (H) 07/10/2022 1012   CHOL 219 (H) 06/17/2012 1620   TRIG 47 01/31/2023 0454   TRIG 215 (H) 06/17/2012 1620   HDL 59 01/31/2023 0454   HDL 48 07/10/2022 1012   HDL 34 (L) 06/17/2012 1620   CHOLHDL 4.0 01/31/2023 0454   VLDL 9 01/31/2023 0454   LDLCALC 167 (H) 01/31/2023 0454   LDLCALC 134 (H) 07/10/2022 1012   LDLCALC 142 (H) 06/17/2012 1620   Lab Results  Component Value Date   HGBA1C 5.1 01/31/2023      Component Value Date/Time   LABOPIA NONE DETECTED 01/31/2023 0039   COCAINSCRNUR NONE DETECTED 01/31/2023 0039   LABBENZ NONE DETECTED 01/31/2023 0039   AMPHETMU NONE DETECTED 01/31/2023 0039   THCU  NONE DETECTED 01/31/2023 0039   LABBARB NONE DETECTED 01/31/2023 0039    No results for input(s): "ETH" in the last 168 hours.   I have personally reviewed the radiological images below and agree with the radiology interpretations.  CT HEAD WO CONTRAST ( ) Result Date: 02/06/2023 CLINICAL DATA:  77 year old male code stroke presentation on 01/31/2023 with multiple intracranial occlusions. Status post endovascular reperfusion, stenting. Left lentiform hemorrhagic transformation in the left basal ganglia and occipital lobe. EXAM: CT HEAD WITHOUT CONTRAST TECHNIQUE: Contiguous axial images were obtained from the base of the skull through the vertex without intravenous contrast. RADIATION DOSE REDUCTION: This exam was performed according to the departmental dose-optimization program which includes automated exposure control, adjustment of the mA and/or kV according to patient size and/or use of iterative reconstruction technique. COMPARISON:  Brain MRI 02/04/2023. FINDINGS: Brain: Oval left lentiform hemorrhage is 25 x 17 by 32 mm (AP by transverse by CC) for estimated volume of 7 mL and appears mildly larger since the MRI on 02/04/2023. Regional hypodense edema. Oval left occipital pole cortical and subcortical hyperdense hemorrhage superimposed on confluent cytotoxic edema there is 19 x 42 x 14 mm (AP by transverse by CC) for an estimated blood volume of 6 mL, stable since 02/04/2023.  No intraventricular blood. Trace additional left hemisphere subarachnoid hemorrhage suspected such as on sagittal image 43 left superior frontal gyrus. No midline shift or significant intracranial mass effect. Mild mass effect on the left lateral ventricle is stable. No ventriculomegaly. Underlying scattered chronic cerebral encephalomalacia including in the posterior fossa. No new cortically based infarct identified. Vascular: Distal left ICA stent. Calcified atherosclerosis at the skull base. Skull: Stable, intact.  Sinuses/Orbits: Visualized paranasal sinuses and mastoids are stable and well aerated. Other: No acute orbit or scalp soft tissue finding. IMPRESSION: 1. Left lentiform hemorrhage estimated at 7 mL appears slightly larger since 02/04/2023. Hemorrhagic transformation of the left PCA infarct estimated at 6 mL is stable. Trace left hemisphere subarachnoid hemorrhage suspected. 2. No midline shift or significant intracranial mass effect. No new intracranial abnormality. Multifocal chronic ischemic disease. Electronically Signed   By: Odessa Fleming M.D.   On: 02/06/2023 09:44   MR BRAIN WO CONTRAST Addendum Date: 02/04/2023 ADDENDUM REPORT: 02/04/2023 21:09 ADDENDUM: Results were communicated by telephone at the time of interpretation on 02/04/2023 at 9 p.m. to provider Dr. Otelia Limes, who verbally acknowledged these results. Electronically Signed   By: Rise Mu M.D.   On: 02/04/2023 21:09   Result Date: 02/04/2023 CLINICAL DATA:  Follow-up examination for stroke. EXAM: MRI HEAD WITHOUT CONTRAST TECHNIQUE: Multiplanar, multiecho pulse sequences of the brain and surrounding structures were obtained without intravenous contrast. COMPARISON:  Previous MRI from 02/01/2023 and CT from 01/31/2023 FINDINGS: Brain: Atrophy with chronic microvascular ischemic disease. Evolving late subacute right PCA distribution infarct involving the right occipital lobe. Multifocal chronic infarcts involving the left greater than right cerebellum. Few scattered remote lacunar infarcts about the basal ganglia and pons. Continued interval evolution of subacute left PCA distribution infarct, similar in size as compared to previous. Few additional patchy foci of all vein subacute ischemia noted superiorly within the subcortical posterior left frontal parietal region, also similar. There has been evidence for hemorrhagic transformation with a hematoma measuring 2.7 x 1.5 cm now seen at the left occipital lobe (series 14, image 22).  Additional new acute intraparenchymal hemorrhage at the posterior left basal ganglia measures approximately 2.2 x 1.9 cm (series 14, image 32). No significant regional mass effect. No intraventricular extension. Additional small volume subarachnoid hemorrhage at the anterior left frontal convexity, slightly less conspicuous as compared to prior. Additional scattered chronic micro hemorrhages noted, stable. No other new or interval infarction elsewhere. 3.7 cm benign arachnoid cyst at the left frontal vertex again noted. No other mass lesion or midline shift. No hydrocephalus. No extra-axial fluid collection. Vascular: Loss of normal flow void within the left vertebral artery, stable. Heterogeneous but preserved flow voids within the remaining posterior circulation. Normal flow voids seen within the anterior circulation. Skull and upper cervical spine: Craniocervical junction within normal limits. Bone marrow signal intensity normal. No scalp soft tissue abnormality. Sinuses/Orbits: Prior bilateral ocular lens replacement. Paranasal sinuses remain largely clear. No significant mastoid effusion. Other: 9 mm T2 hyperintense lesion within the right parotid gland (series 15, image 21), indeterminate. IMPRESSION: 1. Continued interval evolution of subacute left PCA distribution infarct, similar in size as compared to previous. Interval hemorrhagic transformation with a 2.7 x 1.5 cm hematoma at the left occipital lobe. Additional new acute intraparenchymal hemorrhage at the posterior left basal ganglia measuring 2.2 x 1.9 cm. No significant regional mass effect. No intraventricular extension. 2. Additional small volume subarachnoid hemorrhage at the anterior left frontal convexity, slightly less conspicuous as compared to prior. 3. Evolving  late subacute right PCA distribution infarct, stable. 4. Underlying atrophy with chronic microvascular ischemic disease, with multiple chronic infarcts as above. 5. 9 mm T2 hyperintense  lesion within the right parotid gland, indeterminate. Correlation with nonemergent outpatient ultrasound suggested for further evaluation. The covering neurohospitalist has been paged regarding these findings, currently awaiting a call back. Results will be conveyed as soon as possible. Electronically Signed: By: Rise Mu M.D. On: 02/04/2023 20:59   IR PERCUTANEOUS ART THROMBECTOMY/INFUSION INTRACRANIAL INC DIAG ANGIO Result Date: 02/02/2023 INDICATION: History of repeated falls. Patient with new onset expressive aphasia and possible right sided weakness. 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 Also noted mid basilar artery occlusion. EXAM: 1. EMERGENT LARGE VESSEL OCCLUSION THROMBOLYSIS (anterior CIRCULATION) COMPARISON:  Recent CT angiogram of the head and neck, and MRI of the brain. MEDICATIONS: Ancef 2 g IV was administered within 1 hour of the procedure. ANESTHESIA/SEDATION: General anesthesia. CONTRAST:  Omnipaque 300 approximately 110 mL. FLUOROSCOPY TIME:  Fluoroscopy Time: 31 minutes 42 seconds (1110 mGy). COMPLICATIONS: None immediate. TECHNIQUE: Following a full explanation of the procedure along with the potential associated complications, an informed witnessed consent was obtained. The risks of intracranial hemorrhage of 10%, worsening neurological deficit, ventilator dependency, death and inability to revascularize were all reviewed in detail with the patient. The patient was then put under general anesthesia by the Department of Anesthesiology at [facility]. The right groin was prepped and draped in the usual sterile fashion. Thereafter using modified Seldinger technique, transfemoral access into the right common femoral artery was obtained without difficulty. Over an 0.035 inch guidewire an 8 French 25 cm Pinnacle sheath was inserted. Through this, and also over an 0.035 inch guidewire a  combination of a 125 cm 6 [place] Berenstein support catheter inside of an 087 95 cm balloon guide catheter was advanced to the aortic arch region and advanced to the distal left common carotid artery. The guidewire, and the support catheter were removed. Good aspiration was obtained from the hub of the balloon guide catheter. A control arteriogram was then performed centered extra cranially and intracranially. FINDINGS: The left common carotid arteriogram demonstrates the left external carotid artery and its major branches to be widely patent. The left internal carotid artery just distal to the bulb has a focal severe stenosis. 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 PROCEDURE: Through the balloon guide catheter in the distal left ICA, and over an 014 inch 300 cm exchange micro guidewire positioned in the horizontal petrous segment, control angioplasty of the proximal left internal carotid stenosis was performed with a 4 mm x 30 cm Viatrac 14 angioplasty balloon catheter which had been prepped with 50% contrast and 50% heparinized saline infusion. Using the rapid exchange technique, the balloon was positioned with its markers adequate distant from the site of severe stenosis. A control inflation was then performed using a micro inflation syringe device via micro tubing to approximately 4 mm where it was maintained for approximately a minute and a half. Thereafter, following deflation of the balloon and removal a control arteriogram performed through the balloon guide catheter demonstrated now improved caliber of the angioplastied segment. At this time a 071 Zoom aspiration catheter was advanced in combination with an 021 160 cm Trevo ProVue microcatheter over the  exchange micro guidewire to the proximal petrous segment. The 300 cm 014 exchange micro guidewire was  then exchanged for an 018 inch micro guidewire with a moderate J configuration distally. The micro guidewire with the microcatheter was then gently advanced without difficulty through the occluded supraclinoid left ICA to the middle cerebral artery inferior division M2 segment followed by the microcatheter. The guidewire was removed. Good aspiration obtained from the hub of the microcatheter. A gentle control arteriogram performed through this demonstrated safe positioning of the tip of the microcatheter. A 4 mm x 40 mm Solitaire X retrieval device was then deployed with the proximal portion in the supraclinoid left ICA. At this time the Zoom aspiration catheter was advanced without difficulty to the mid M1 segment. Control aspiration was then performed for approximately a minute and a half at the hub of the Zoom aspiration catheter, and with a 20 mL syringe at the hub of the balloon guide catheter for approximately a minute and a half. The combination was then retrieved and removed. Following reversal of flow arrest in the left internal carotid artery a control arteriogram performed demonstrated complete revascularization of the supraclinoid left ICA with opacification of the left middle cerebral artery achieving a TICI 3 revascularization. Also noted was opacification of the proximal left A1 segment with focal areas of severe stenosis due to arteriosclerosis. Bilateral PCOMs demonstrated opacification into the left posterior cerebral artery P1 segment. Underlying intracranial arteriosclerosis with resulting severe stenosis was noted just proximal to the origins of the posterior communicating arteries. Through the balloon guide catheter in the distal left ICA, an aspiration 5 French 115 cm guide catheter was then advanced to the proximal cavernous segment over an 035 inch guidewire. Through this, 160 cm Trevo ProVue microcatheter was advanced over an 018 inch micro guidewire. The microcatheter and the micro  guidewire were advanced to the distal M2 M3 regions of the inferior division followed by the microcatheter. The guidewire was removed. Good aspiration obtained from the hub of the microcatheter. This in turn was exchanged for an 014 inch 300 cm exchange micro guidewire with a moderate J configuration at its distal end. A control arteriogram performed demonstrated safe positioning of the tip of the micro guidewire. Measurements were then performed of the left ICA supraclinoid segment just proximal to the bifurcation and just distal to the origin of the ophthalmic artery. A 3 mm x 12 mm Onyx Frontier balloon mounted stent which had been prepped antegradely with 50% contrast and 50% heparinized saline infusion, and retrogradely with heparinized saline infusion, was advanced in a coaxial manner and with constant heparinized saline infusion and positioned without difficulty with the stent markers adequate distant from the area of severe stenosis. A control inflation was then performed using a micro inflation syringe device via micro tubing to just over 3 mm x 2 where it was maintained for approximately a minute and a half. Following balloon deflation and retrieval and removal, a control arteriogram performed through the 5 [place] intermediate catheter demonstrated significantly improved caliber of the supraclinoid left ICA with a TICI 3 revascularization of the left MCA being persistent. Again demonstrated was the stenosed left anterior cerebral A1 segment. Over the exchange micro guidewire now in the proximal cavernous segment, a control arteriogram performed through the balloon guide catheter following removal of the intermediate catheter. Measurements were then performed of the left internal carotid artery proximally at the site of the significant stenosis and the distal left common carotid artery. A 6/8 mm x  40 mm Xact stent delivery apparatus was then advanced in a coaxial manner and with constant heparinized saline  infusion and positioned without difficulty with the adequate coverage distal to the angioplastied segment and into the distal left common carotid artery. The stent was then deployed in the usual manner without any difficulty. The delivery apparatus was removed. A control arteriogram performed through the balloon guide catheter demonstrated significantly improved caliber through the stented segment of the proximal left ICA. The waist at the site of the angioplasty was then treated with a 5 mm x 30 mm Viatrac angioplasty microcatheter which was advanced after its preparation as described above. Prior to the angioplasty, proximal flow arrest was initiated by inflating the balloon in the distal left common carotid artery, and with proximal aspiration during the angioplasty. Following the angioplasty, the balloon was retrieved and removed while aspiration was maintained as flow arrest was reversed. Control arteriograms were then performed through the balloon guide catheter over the next 10-20 minutes continued to demonstrate excellent flow through the centered in the proximal left ICA. Intracranially no change was seen in the TICI 3 revascularization of the left middle cerebral artery distribution. A 5 French diagnostic catheter was then advanced into the right common carotid artery over an 035 inch guidewire. The right common carotid artery injection demonstrated patency of the right internal carotid artery at the neck, and intracranially. The petrous, the cavernous and the supraclinoid right ICA demonstrate wide patency. The right middle cerebral artery and the right anterior cerebral artery demonstrate wide patency into the capillary and venous phases with a 50% stenosis of the right middle cerebral artery M1 segment. Cross-filling via the anterior communicating artery of the left anterior cerebral A2 segment, and the left M1 segment was evident. An 8 French Angio-Seal closure device was deployed at the right groin  puncture site for hemostasis. Distal pulses remained present unchanged in both feet at the end of the procedure. Flat panel CT of the brain demonstrated no evidence of intracranial hemorrhage. Patient was extubated in was beginning to respond to simple commands appropriately slightly weaker on the right side. Medications utilized during the procedure. 81 mg of aspirin and Plavix 15 mg via orogastric tube prior to angioplasty. The patient was also given half bolus dose of cangrelor followed by a 4 hour half dose infusion to be followed by CT of the brain. Patient was then transferred to the neuro ICU for post revascularization care. 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 Underlying a significant atherosclerotic related stenosis treated with a 3 mm x 12 mm Onyx Frontier balloon mounted stent with significantly improved caliber and flow. Status post stent assisted angioplasty of proximal left ICA stenosis with proximal flow arrest as described without event. PLAN: Follow-up in the clinic 4 weeks post discharge. Electronically Signed   By: Julieanne Cotton M.D.   On: 02/02/2023 07:58   IR CT Head Ltd Result Date: 02/02/2023 INDICATION: History of repeated falls. Patient with new onset expressive aphasia and possible right sided weakness. 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 Also noted mid basilar artery occlusion. EXAM: 1. EMERGENT LARGE VESSEL OCCLUSION THROMBOLYSIS (anterior CIRCULATION) COMPARISON:  Recent CT angiogram of the head and neck, and MRI of the brain. MEDICATIONS: Ancef  2 g IV was administered within 1 hour of the procedure. ANESTHESIA/SEDATION: General anesthesia. CONTRAST:  Omnipaque 300 approximately 110  mL. FLUOROSCOPY TIME:  Fluoroscopy Time: 31 minutes 42 seconds (1110 mGy). COMPLICATIONS: None immediate. TECHNIQUE: Following a full explanation of the procedure along with the potential associated complications, an informed witnessed consent was obtained. The risks of intracranial hemorrhage of 10%, worsening neurological deficit, ventilator dependency, death and inability to revascularize were all reviewed in detail with the patient. The patient was then put under general anesthesia by the Department of Anesthesiology at [facility]. The right groin was prepped and draped in the usual sterile fashion. Thereafter using modified Seldinger technique, transfemoral access into the right common femoral artery was obtained without difficulty. Over an 0.035 inch guidewire an 8 French 25 cm Pinnacle sheath was inserted. Through this, and also over an 0.035 inch guidewire a combination of a 125 cm 6 [place] Berenstein support catheter inside of an 087 95 cm balloon guide catheter was advanced to the aortic arch region and advanced to the distal left common carotid artery. The guidewire, and the support catheter were removed. Good aspiration was obtained from the hub of the balloon guide catheter. A control arteriogram was then performed centered extra cranially and intracranially. FINDINGS: The left common carotid arteriogram demonstrates the left external carotid artery and its major branches to be widely patent. The left internal carotid artery just distal to the bulb has a focal severe stenosis. 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 PROCEDURE: Through the balloon guide catheter in the distal left ICA, and over an 014 inch 300 cm exchange micro guidewire positioned in the horizontal petrous segment, control angioplasty of the proximal left internal carotid  stenosis was performed with a 4 mm x 30 cm Viatrac 14 angioplasty balloon catheter which had been prepped with 50% contrast and 50% heparinized saline infusion. Using the rapid exchange technique, the balloon was positioned with its markers adequate distant from the site of severe stenosis. A control inflation was then performed using a micro inflation syringe device via micro tubing to approximately 4 mm where it was maintained for approximately a minute and a half. Thereafter, following deflation of the balloon and removal a control arteriogram performed through the balloon guide catheter demonstrated now improved caliber of the angioplastied segment. At this time a 071 Zoom aspiration catheter was advanced in combination with an 021 160 cm Trevo ProVue microcatheter over the exchange micro guidewire to the proximal petrous segment. The 300 cm 014 exchange micro guidewire was then exchanged for an 018 inch micro guidewire with a moderate J configuration distally. The micro guidewire with the microcatheter was then gently advanced without difficulty through the occluded supraclinoid left ICA to the middle cerebral artery inferior division M2 segment followed by the microcatheter. The guidewire was removed. Good aspiration obtained from the hub of the microcatheter. A gentle control arteriogram performed through this demonstrated safe positioning of the tip of the microcatheter. A 4 mm x 40 mm Solitaire X retrieval device was then deployed with the proximal portion in the supraclinoid left ICA. At this time the Zoom aspiration catheter was advanced without difficulty to the mid M1 segment. Control aspiration was then performed for approximately a minute and a half at the hub of the Zoom aspiration catheter, and with a 20 mL  syringe at the hub of the balloon guide catheter for approximately a minute and a half. The combination was then retrieved and removed. Following reversal of flow arrest in the left internal  carotid artery a control arteriogram performed demonstrated complete revascularization of the supraclinoid left ICA with opacification of the left middle cerebral artery achieving a TICI 3 revascularization. Also noted was opacification of the proximal left A1 segment with focal areas of severe stenosis due to arteriosclerosis. Bilateral PCOMs demonstrated opacification into the left posterior cerebral artery P1 segment. Underlying intracranial arteriosclerosis with resulting severe stenosis was noted just proximal to the origins of the posterior communicating arteries. Through the balloon guide catheter in the distal left ICA, an aspiration 5 French 115 cm guide catheter was then advanced to the proximal cavernous segment over an 035 inch guidewire. Through this, 160 cm Trevo ProVue microcatheter was advanced over an 018 inch micro guidewire. The microcatheter and the micro guidewire were advanced to the distal M2 M3 regions of the inferior division followed by the microcatheter. The guidewire was removed. Good aspiration obtained from the hub of the microcatheter. This in turn was exchanged for an 014 inch 300 cm exchange micro guidewire with a moderate J configuration at its distal end. A control arteriogram performed demonstrated safe positioning of the tip of the micro guidewire. Measurements were then performed of the left ICA supraclinoid segment just proximal to the bifurcation and just distal to the origin of the ophthalmic artery. A 3 mm x 12 mm Onyx Frontier balloon mounted stent which had been prepped antegradely with 50% contrast and 50% heparinized saline infusion, and retrogradely with heparinized saline infusion, was advanced in a coaxial manner and with constant heparinized saline infusion and positioned without difficulty with the stent markers adequate distant from the area of severe stenosis. A control inflation was then performed using a micro inflation syringe device via micro tubing to just  over 3 mm x 2 where it was maintained for approximately a minute and a half. Following balloon deflation and retrieval and removal, a control arteriogram performed through the 5 [place] intermediate catheter demonstrated significantly improved caliber of the supraclinoid left ICA with a TICI 3 revascularization of the left MCA being persistent. Again demonstrated was the stenosed left anterior cerebral A1 segment. Over the exchange micro guidewire now in the proximal cavernous segment, a control arteriogram performed through the balloon guide catheter following removal of the intermediate catheter. Measurements were then performed of the left internal carotid artery proximally at the site of the significant stenosis and the distal left common carotid artery. A 6/8 mm x 40 mm Xact stent delivery apparatus was then advanced in a coaxial manner and with constant heparinized saline infusion and positioned without difficulty with the adequate coverage distal to the angioplastied segment and into the distal left common carotid artery. The stent was then deployed in the usual manner without any difficulty. The delivery apparatus was removed. A control arteriogram performed through the balloon guide catheter demonstrated significantly improved caliber through the stented segment of the proximal left ICA. The waist at the site of the angioplasty was then treated with a 5 mm x 30 mm Viatrac angioplasty microcatheter which was advanced after its preparation as described above. Prior to the angioplasty, proximal flow arrest was initiated by inflating the balloon in the distal left common carotid artery, and with proximal aspiration during the angioplasty. Following the angioplasty, the balloon was retrieved and removed while aspiration was maintained as flow arrest was  reversed. Control arteriograms were then performed through the balloon guide catheter over the next 10-20 minutes continued to demonstrate excellent flow through  the centered in the proximal left ICA. Intracranially no change was seen in the TICI 3 revascularization of the left middle cerebral artery distribution. A 5 French diagnostic catheter was then advanced into the right common carotid artery over an 035 inch guidewire. The right common carotid artery injection demonstrated patency of the right internal carotid artery at the neck, and intracranially. The petrous, the cavernous and the supraclinoid right ICA demonstrate wide patency. The right middle cerebral artery and the right anterior cerebral artery demonstrate wide patency into the capillary and venous phases with a 50% stenosis of the right middle cerebral artery M1 segment. Cross-filling via the anterior communicating artery of the left anterior cerebral A2 segment, and the left M1 segment was evident. An 8 French Angio-Seal closure device was deployed at the right groin puncture site for hemostasis. Distal pulses remained present unchanged in both feet at the end of the procedure. Flat panel CT of the brain demonstrated no evidence of intracranial hemorrhage. Patient was extubated in was beginning to respond to simple commands appropriately slightly weaker on the right side. Medications utilized during the procedure. 81 mg of aspirin and Plavix 15 mg via orogastric tube prior to angioplasty. The patient was also given half bolus dose of cangrelor followed by a 4 hour half dose infusion to be followed by CT of the brain. Patient was then transferred to the neuro ICU for post revascularization care. 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 Underlying a significant atherosclerotic related stenosis treated with a 3 mm x 12 mm Onyx Frontier balloon mounted stent with significantly improved caliber and flow.  Status post stent assisted angioplasty of proximal left ICA stenosis with proximal flow arrest as described without event. PLAN: Follow-up in the clinic 4 weeks post discharge. Electronically Signed   By: Julieanne Cotton M.D.   On: 02/02/2023 07:58   IR INTRAVSC STENT CERV CAROTID W/O EMB-PROT MOD SED Result Date: 02/02/2023 INDICATION: History of repeated falls. Patient with new onset expressive aphasia and possible right sided weakness. 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 Also noted mid basilar artery occlusion. EXAM: 1. EMERGENT LARGE VESSEL OCCLUSION THROMBOLYSIS (anterior CIRCULATION) COMPARISON:  Recent CT angiogram of the head and neck, and MRI of the brain. MEDICATIONS: Ancef 2 g IV was administered within 1 hour of the procedure. ANESTHESIA/SEDATION: General anesthesia. CONTRAST:  Omnipaque 300 approximately 110 mL. FLUOROSCOPY TIME:  Fluoroscopy Time: 31 minutes 42 seconds (1110 mGy). COMPLICATIONS: None immediate. TECHNIQUE: Following a full explanation of the procedure along with the potential associated complications, an informed witnessed consent was obtained. The risks of intracranial hemorrhage of 10%, worsening neurological deficit, ventilator dependency, death and inability to revascularize were all reviewed in detail with the patient. The patient was then put under general anesthesia by the Department of Anesthesiology at [facility]. The right groin was prepped and draped in the usual sterile fashion. Thereafter using modified Seldinger technique, transfemoral access into the right common femoral artery was obtained without difficulty. Over an 0.035 inch guidewire an 8 French 25 cm Pinnacle sheath was inserted. Through this, and also over an 0.035 inch  guidewire a combination of a 125 cm 6 [place] Berenstein support catheter inside of an 087 95 cm balloon guide catheter was advanced to the aortic  arch region and advanced to the distal left common carotid artery. The guidewire, and the support catheter were removed. Good aspiration was obtained from the hub of the balloon guide catheter. A control arteriogram was then performed centered extra cranially and intracranially. FINDINGS: The left common carotid arteriogram demonstrates the left external carotid artery and its major branches to be widely patent. The left internal carotid artery just distal to the bulb has a focal severe stenosis. 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 PROCEDURE: Through the balloon guide catheter in the distal left ICA, and over an 014 inch 300 cm exchange micro guidewire positioned in the horizontal petrous segment, control angioplasty of the proximal left internal carotid stenosis was performed with a 4 mm x 30 cm Viatrac 14 angioplasty balloon catheter which had been prepped with 50% contrast and 50% heparinized saline infusion. Using the rapid exchange technique, the balloon was positioned with its markers adequate distant from the site of severe stenosis. A control inflation was then performed using a micro inflation syringe device via micro tubing to approximately 4 mm where it was maintained for approximately a minute and a half. Thereafter, following deflation of the balloon and removal a control arteriogram performed through the balloon guide catheter demonstrated now improved caliber of the angioplastied segment. At this time a 071 Zoom aspiration catheter was advanced in combination with an 021 160 cm Trevo ProVue microcatheter over the exchange micro guidewire to the proximal petrous segment. The 300 cm 014 exchange micro guidewire was then exchanged for an 018 inch micro guidewire with a moderate J configuration distally. The micro guidewire with the microcatheter was  then gently advanced without difficulty through the occluded supraclinoid left ICA to the middle cerebral artery inferior division M2 segment followed by the microcatheter. The guidewire was removed. Good aspiration obtained from the hub of the microcatheter. A gentle control arteriogram performed through this demonstrated safe positioning of the tip of the microcatheter. A 4 mm x 40 mm Solitaire X retrieval device was then deployed with the proximal portion in the supraclinoid left ICA. At this time the Zoom aspiration catheter was advanced without difficulty to the mid M1 segment. Control aspiration was then performed for approximately a minute and a half at the hub of the Zoom aspiration catheter, and with a 20 mL syringe at the hub of the balloon guide catheter for approximately a minute and a half. The combination was then retrieved and removed. Following reversal of flow arrest in the left internal carotid artery a control arteriogram performed demonstrated complete revascularization of the supraclinoid left ICA with opacification of the left middle cerebral artery achieving a TICI 3 revascularization. Also noted was opacification of the proximal left A1 segment with focal areas of severe stenosis due to arteriosclerosis. Bilateral PCOMs demonstrated opacification into the left posterior cerebral artery P1 segment. Underlying intracranial arteriosclerosis with resulting severe stenosis was noted just proximal to the origins of the posterior communicating arteries. Through the balloon guide catheter in the distal left ICA, an aspiration 5 French 115 cm guide catheter was then advanced to the proximal cavernous segment over an 035 inch guidewire. Through this, 160 cm Trevo ProVue microcatheter was  advanced over an 018 inch micro guidewire. The microcatheter and the micro guidewire were advanced to the distal M2 M3 regions of the inferior division followed by the microcatheter. The guidewire was removed. Good  aspiration obtained from the hub of the microcatheter. This in turn was exchanged for an 014 inch 300 cm exchange micro guidewire with a moderate J configuration at its distal end. A control arteriogram performed demonstrated safe positioning of the tip of the micro guidewire. Measurements were then performed of the left ICA supraclinoid segment just proximal to the bifurcation and just distal to the origin of the ophthalmic artery. A 3 mm x 12 mm Onyx Frontier balloon mounted stent which had been prepped antegradely with 50% contrast and 50% heparinized saline infusion, and retrogradely with heparinized saline infusion, was advanced in a coaxial manner and with constant heparinized saline infusion and positioned without difficulty with the stent markers adequate distant from the area of severe stenosis. A control inflation was then performed using a micro inflation syringe device via micro tubing to just over 3 mm x 2 where it was maintained for approximately a minute and a half. Following balloon deflation and retrieval and removal, a control arteriogram performed through the 5 [place] intermediate catheter demonstrated significantly improved caliber of the supraclinoid left ICA with a TICI 3 revascularization of the left MCA being persistent. Again demonstrated was the stenosed left anterior cerebral A1 segment. Over the exchange micro guidewire now in the proximal cavernous segment, a control arteriogram performed through the balloon guide catheter following removal of the intermediate catheter. Measurements were then performed of the left internal carotid artery proximally at the site of the significant stenosis and the distal left common carotid artery. A 6/8 mm x 40 mm Xact stent delivery apparatus was then advanced in a coaxial manner and with constant heparinized saline infusion and positioned without difficulty with the adequate coverage distal to the angioplastied segment and into the distal left common  carotid artery. The stent was then deployed in the usual manner without any difficulty. The delivery apparatus was removed. A control arteriogram performed through the balloon guide catheter demonstrated significantly improved caliber through the stented segment of the proximal left ICA. The waist at the site of the angioplasty was then treated with a 5 mm x 30 mm Viatrac angioplasty microcatheter which was advanced after its preparation as described above. Prior to the angioplasty, proximal flow arrest was initiated by inflating the balloon in the distal left common carotid artery, and with proximal aspiration during the angioplasty. Following the angioplasty, the balloon was retrieved and removed while aspiration was maintained as flow arrest was reversed. Control arteriograms were then performed through the balloon guide catheter over the next 10-20 minutes continued to demonstrate excellent flow through the centered in the proximal left ICA. Intracranially no change was seen in the TICI 3 revascularization of the left middle cerebral artery distribution. A 5 French diagnostic catheter was then advanced into the right common carotid artery over an 035 inch guidewire. The right common carotid artery injection demonstrated patency of the right internal carotid artery at the neck, and intracranially. The petrous, the cavernous and the supraclinoid right ICA demonstrate wide patency. The right middle cerebral artery and the right anterior cerebral artery demonstrate wide patency into the capillary and venous phases with a 50% stenosis of the right middle cerebral artery M1 segment. Cross-filling via the anterior communicating artery of the left anterior cerebral A2 segment, and the left M1 segment was evident.  An 8 French Angio-Seal closure device was deployed at the right groin puncture site for hemostasis. Distal pulses remained present unchanged in both feet at the end of the procedure. Flat panel CT of the brain  demonstrated no evidence of intracranial hemorrhage. Patient was extubated in was beginning to respond to simple commands appropriately slightly weaker on the right side. Medications utilized during the procedure. 81 mg of aspirin and Plavix 15 mg via orogastric tube prior to angioplasty. The patient was also given half bolus dose of cangrelor followed by a 4 hour half dose infusion to be followed by CT of the brain. Patient was then transferred to the neuro ICU for post revascularization care. 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 Underlying a significant atherosclerotic related stenosis treated with a 3 mm x 12 mm Onyx Frontier balloon mounted stent with significantly improved caliber and flow. Status post stent assisted angioplasty of proximal left ICA stenosis with proximal flow arrest as described without event. PLAN: Follow-up in the clinic 4 weeks post discharge. Electronically Signed   By: Julieanne Cotton M.D.   On: 02/02/2023 07:58   IR Intra Cran Stent Result Date: 02/02/2023 INDICATION: History of repeated falls. Patient with new onset expressive aphasia and possible right sided weakness. 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 Also noted mid basilar artery occlusion. EXAM: 1. EMERGENT LARGE VESSEL OCCLUSION THROMBOLYSIS (anterior CIRCULATION) COMPARISON:  Recent CT angiogram of the head and neck, and MRI of the brain. MEDICATIONS: Ancef 2 g IV was administered within 1 hour of the procedure. ANESTHESIA/SEDATION: General anesthesia. CONTRAST:  Omnipaque 300 approximately 110 mL. FLUOROSCOPY TIME:  Fluoroscopy Time: 31 minutes 42 seconds (1110 mGy). COMPLICATIONS: None immediate. TECHNIQUE: Following a full  explanation of the procedure along with the potential associated complications, an informed witnessed consent was obtained. The risks of intracranial hemorrhage of 10%, worsening neurological deficit, ventilator dependency, death and inability to revascularize were all reviewed in detail with the patient. The patient was then put under general anesthesia by the Department of Anesthesiology at [facility]. The right groin was prepped and draped in the usual sterile fashion. Thereafter using modified Seldinger technique, transfemoral access into the right common femoral artery was obtained without difficulty. Over an 0.035 inch guidewire an 8 French 25 cm Pinnacle sheath was inserted. Through this, and also over an 0.035 inch guidewire a combination of a 125 cm 6 [place] Berenstein support catheter inside of an 087 95 cm balloon guide catheter was advanced to the aortic arch region and advanced to the distal left common carotid artery. The guidewire, and the support catheter were removed. Good aspiration was obtained from the hub of the balloon guide catheter. A control arteriogram was then performed centered extra cranially and intracranially. FINDINGS: The left common carotid arteriogram demonstrates the left external carotid artery and its major branches to be widely patent. The left internal carotid artery just distal to the bulb has a focal severe stenosis. 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 PROCEDURE: Through the balloon guide catheter in the distal left ICA, and over an 014  inch 300 cm exchange micro guidewire positioned in the horizontal petrous segment, control angioplasty of the proximal left internal carotid stenosis was performed with a 4 mm x 30 cm Viatrac 14 angioplasty balloon catheter which had been prepped with 50% contrast and 50%  heparinized saline infusion. Using the rapid exchange technique, the balloon was positioned with its markers adequate distant from the site of severe stenosis. A control inflation was then performed using a micro inflation syringe device via micro tubing to approximately 4 mm where it was maintained for approximately a minute and a half. Thereafter, following deflation of the balloon and removal a control arteriogram performed through the balloon guide catheter demonstrated now improved caliber of the angioplastied segment. At this time a 071 Zoom aspiration catheter was advanced in combination with an 021 160 cm Trevo ProVue microcatheter over the exchange micro guidewire to the proximal petrous segment. The 300 cm 014 exchange micro guidewire was then exchanged for an 018 inch micro guidewire with a moderate J configuration distally. The micro guidewire with the microcatheter was then gently advanced without difficulty through the occluded supraclinoid left ICA to the middle cerebral artery inferior division M2 segment followed by the microcatheter. The guidewire was removed. Good aspiration obtained from the hub of the microcatheter. A gentle control arteriogram performed through this demonstrated safe positioning of the tip of the microcatheter. A 4 mm x 40 mm Solitaire X retrieval device was then deployed with the proximal portion in the supraclinoid left ICA. At this time the Zoom aspiration catheter was advanced without difficulty to the mid M1 segment. Control aspiration was then performed for approximately a minute and a half at the hub of the Zoom aspiration catheter, and with a 20 mL syringe at the hub of the balloon guide catheter for approximately a minute and a half. The combination was then retrieved and removed. Following reversal of flow arrest in the left internal carotid artery a control arteriogram performed demonstrated complete revascularization of the supraclinoid left ICA with opacification  of the left middle cerebral artery achieving a TICI 3 revascularization. Also noted was opacification of the proximal left A1 segment with focal areas of severe stenosis due to arteriosclerosis. Bilateral PCOMs demonstrated opacification into the left posterior cerebral artery P1 segment. Underlying intracranial arteriosclerosis with resulting severe stenosis was noted just proximal to the origins of the posterior communicating arteries. Through the balloon guide catheter in the distal left ICA, an aspiration 5 French 115 cm guide catheter was then advanced to the proximal cavernous segment over an 035 inch guidewire. Through this, 160 cm Trevo ProVue microcatheter was advanced over an 018 inch micro guidewire. The microcatheter and the micro guidewire were advanced to the distal M2 M3 regions of the inferior division followed by the microcatheter. The guidewire was removed. Good aspiration obtained from the hub of the microcatheter. This in turn was exchanged for an 014 inch 300 cm exchange micro guidewire with a moderate J configuration at its distal end. A control arteriogram performed demonstrated safe positioning of the tip of the micro guidewire. Measurements were then performed of the left ICA supraclinoid segment just proximal to the bifurcation and just distal to the origin of the ophthalmic artery. A 3 mm x 12 mm Onyx Frontier balloon mounted stent which had been prepped antegradely with 50% contrast and 50% heparinized saline infusion, and retrogradely with heparinized saline infusion, was advanced in a coaxial manner and with constant heparinized saline infusion and positioned without difficulty with the stent  markers adequate distant from the area of severe stenosis. A control inflation was then performed using a micro inflation syringe device via micro tubing to just over 3 mm x 2 where it was maintained for approximately a minute and a half. Following balloon deflation and retrieval and removal, a  control arteriogram performed through the 5 [place] intermediate catheter demonstrated significantly improved caliber of the supraclinoid left ICA with a TICI 3 revascularization of the left MCA being persistent. Again demonstrated was the stenosed left anterior cerebral A1 segment. Over the exchange micro guidewire now in the proximal cavernous segment, a control arteriogram performed through the balloon guide catheter following removal of the intermediate catheter. Measurements were then performed of the left internal carotid artery proximally at the site of the significant stenosis and the distal left common carotid artery. A 6/8 mm x 40 mm Xact stent delivery apparatus was then advanced in a coaxial manner and with constant heparinized saline infusion and positioned without difficulty with the adequate coverage distal to the angioplastied segment and into the distal left common carotid artery. The stent was then deployed in the usual manner without any difficulty. The delivery apparatus was removed. A control arteriogram performed through the balloon guide catheter demonstrated significantly improved caliber through the stented segment of the proximal left ICA. The waist at the site of the angioplasty was then treated with a 5 mm x 30 mm Viatrac angioplasty microcatheter which was advanced after its preparation as described above. Prior to the angioplasty, proximal flow arrest was initiated by inflating the balloon in the distal left common carotid artery, and with proximal aspiration during the angioplasty. Following the angioplasty, the balloon was retrieved and removed while aspiration was maintained as flow arrest was reversed. Control arteriograms were then performed through the balloon guide catheter over the next 10-20 minutes continued to demonstrate excellent flow through the centered in the proximal left ICA. Intracranially no change was seen in the TICI 3 revascularization of the left middle cerebral  artery distribution. A 5 French diagnostic catheter was then advanced into the right common carotid artery over an 035 inch guidewire. The right common carotid artery injection demonstrated patency of the right internal carotid artery at the neck, and intracranially. The petrous, the cavernous and the supraclinoid right ICA demonstrate wide patency. The right middle cerebral artery and the right anterior cerebral artery demonstrate wide patency into the capillary and venous phases with a 50% stenosis of the right middle cerebral artery M1 segment. Cross-filling via the anterior communicating artery of the left anterior cerebral A2 segment, and the left M1 segment was evident. An 8 French Angio-Seal closure device was deployed at the right groin puncture site for hemostasis. Distal pulses remained present unchanged in both feet at the end of the procedure. Flat panel CT of the brain demonstrated no evidence of intracranial hemorrhage. Patient was extubated in was beginning to respond to simple commands appropriately slightly weaker on the right side. Medications utilized during the procedure. 81 mg of aspirin and Plavix 15 mg via orogastric tube prior to angioplasty. The patient was also given half bolus dose of cangrelor followed by a 4 hour half dose infusion to be followed by CT of the brain. Patient was then transferred to the neuro ICU for post revascularization care. IMPRESSION: Status post complete revascularization of occluded left internal carotid artery terminus with 1 pass with a 4 mm x 40 mm Solitaire X retrieval device, and contact aspiration achieving a TICI 3 revascularization of the  left MCA distribution. Underlying a significant atherosclerotic related stenosis treated with a 3 mm x 12 mm Onyx Frontier balloon mounted stent with significantly improved caliber and flow. Status post stent assisted angioplasty of proximal left ICA stenosis with proximal flow arrest as described without event. PLAN:  Follow-up in the clinic 4 weeks post discharge. Electronically Signed   By: Julieanne Cotton M.D.   On: 02/02/2023 07:58   ECHOCARDIOGRAM COMPLETE Result Date: 02/01/2023    ECHOCARDIOGRAM REPORT   Patient Name:   Grant Berry Date of Exam: 02/01/2023 Medical Rec #:  [id number]          Height:       67.0 in Accession #:    [id number]         Weight:       119.3 lb Date of Birth:  08/02/1945          BSA:          1.623 m Patient Age:    77 years           BP:           172/87 mmHg Patient Gender: M                  HR:           92 bpm. Exam Location:  Inpatient Procedure: 2D Echo, Cardiac Doppler and Color Doppler Indications:    Stroke  History:        Patient has prior history of Echocardiogram examinations, most                 recent 09/02/2019. Stroke; Risk Factors:Hypertension and                 Dyslipidemia.  Sonographer:    Milda Smart Referring Phys: [id number] [facility]  Sonographer Comments: Suboptimal parasternal window. Image acquisition challenging due to patient body habitus and Image acquisition challenging due to respiratory motion. IMPRESSIONS  1. Left ventricular ejection fraction, by estimation, is 60 to 65%. The left ventricle has normal function. The left ventricle has no regional wall motion abnormalities. Left ventricular diastolic parameters are consistent with Grade I diastolic dysfunction (impaired relaxation).  2. Right ventricular systolic function is normal. The right ventricular size is normal.  3. The mitral valve is abnormal. No evidence of mitral valve regurgitation. No evidence of mitral stenosis.  4. The aortic valve was not well visualized. There is mild calcification of the aortic valve. There is mild thickening of the aortic valve. Aortic valve regurgitation is not visualized. Aortic valve sclerosis is present, with no evidence of aortic valve  stenosis.  5. The inferior vena cava is normal in size with greater than 50% respiratory variability, suggesting  right atrial pressure of 3 mmHg. FINDINGS  Left Ventricle: Left ventricular ejection fraction, by estimation, is 60 to 65%. The left ventricle has normal function. The left ventricle has no regional wall motion abnormalities. The left ventricular internal cavity size was normal in size. There is  no left ventricular hypertrophy. Left ventricular diastolic parameters are consistent with Grade I diastolic dysfunction (impaired relaxation). Right Ventricle: The right ventricular size is normal. No increase in right ventricular wall thickness. Right ventricular systolic function is normal. Left Atrium: Left atrial size was normal in size. Right Atrium: Right atrial size was normal in size. Pericardium: There is no evidence of pericardial effusion. Mitral Valve: The mitral valve is abnormal. There is mild thickening of the mitral valve leaflet(s).  There is mild calcification of the mitral valve leaflet(s). Mild mitral annular calcification. No evidence of mitral valve regurgitation. No evidence of mitral valve stenosis. MV peak gradient, 5.8 mmHg. The mean mitral valve gradient is 3.0 mmHg. Tricuspid Valve: The tricuspid valve is normal in structure. Tricuspid valve regurgitation is not demonstrated. No evidence of tricuspid stenosis. Aortic Valve: The aortic valve was not well visualized. There is mild calcification of the aortic valve. There is mild thickening of the aortic valve. Aortic valve regurgitation is not visualized. Aortic valve sclerosis is present, with no evidence of aortic valve stenosis. Pulmonic Valve: The pulmonic valve was normal in structure. Pulmonic valve regurgitation is not visualized. No evidence of pulmonic stenosis. Aorta: The aortic root is normal in size and structure. Venous: The inferior vena cava is normal in size with greater than 50% respiratory variability, suggesting right atrial pressure of 3 mmHg. IAS/Shunts: No atrial level shunt detected by color flow Doppler.  LEFT VENTRICLE PLAX  2D LVIDd:         4.40 cm   Diastology LVIDs:         3.20 cm   LV e' medial:    6.20 cm/s LV PW:         0.80 cm   LV E/e' medial:  13.4 LV IVS:        0.60 cm   LV e' lateral:   7.94 cm/s LVOT diam:     2.50 cm   LV E/e' lateral: 10.5 LVOT Area:     4.91 cm  RIGHT VENTRICLE             IVC RV S prime:     17.80 cm/s  IVC diam: 1.40 cm TAPSE (M-mode): 2.5 cm LEFT ATRIUM             Index        RIGHT ATRIUM           Index LA diam:        3.30 cm 2.03 cm/m   RA Area:     11.70 cm LA Vol (A2C):   19.1 ml 11.77 ml/m  RA Volume:   26.80 ml  16.51 ml/m LA Vol (A4C):   27.2 ml 16.76 ml/m LA Biplane Vol: 23.8 ml 14.66 ml/m   AORTA Ao Root diam: 3.60 cm Ao Asc diam:  3.40 cm MITRAL VALVE MV Area (PHT): 1.73 cm     SHUNTS MV Peak grad:  5.8 mmHg     Systemic Diam: 2.50 cm MV Mean grad:  3.0 mmHg MV Vmax:       1.20 m/s MV Vmean:      83.8 cm/s MV Decel Time: 438 msec MV E velocity: 83.30 cm/s MV A velocity: 108.00 cm/s MV E/A ratio:  0.77 Charlton Haws MD Electronically signed by Charlton Haws MD Signature Date/Time: 02/01/2023/3:01:07 PM    Final    MR BRAIN WO CONTRAST Result Date: 02/01/2023 CLINICAL DATA:  Stroke and embolectomy. EXAM: MRI HEAD WITHOUT CONTRAST TECHNIQUE: Multiplanar, multiecho pulse sequences of the brain and surrounding structures were obtained without intravenous contrast. COMPARISON:  Head CT and MRI from yesterday FINDINGS: Brain: Moderate acute infarct in the left occipital cortex, increased from yesterday. Punctate acute infarct in the superior left frontal white matter since prior. There are areas of low-grade restricted diffusion in the left periatrial white matter and right occipital pole attributed to subacute ischemia. Subarachnoid hemorrhage along the anterior left frontal convexity, distinct from any areas of acute infarction  and known from prior head CT. Pre-existing bilateral cerebellar and brainstem small infarcts. Chronic lacunar infarcts in theright caudate. Ischemic gliosis  is mild. 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 Generalized brain atrophy. Vascular: No flow seen in the left vertebral artery. There is a basilar flow void. Skull and upper cervical spine: No focal marrow lesion Sinuses/Orbits: No acute finding IMPRESSION: 1. Moderate acute infarct in the left occipital lobe with mild progression from yesterday. Interval punctate acute infarct in the left frontal white matter. 2. Areas of chronic and subacute ischemia preferentially affecting the posterior circulation. 3. Known subarachnoid hemorrhage at the anterior left frontal convexity, stable from preceding head CT. Electronically Signed   By: Tiburcio Pea M.D.   On: 02/01/2023 04:58   CT HEAD WO CONTRAST ( ) Result Date: 01/31/2023 CLINICAL DATA:  Stroke, follow-up; post canrelor infusion EXAM: CT HEAD WITHOUT CONTRAST TECHNIQUE: Contiguous axial images were obtained from the base of the skull through the vertex without intravenous contrast. RADIATION DOSE REDUCTION: This exam was performed according to the departmental dose-optimization program which includes automated exposure control, adjustment of the mA and/or kV according to patient size and/or use of iterative reconstruction technique. COMPARISON:  CT head 10/01/2022 and intra procedural CT 10/01/2022 FINDINGS: Brain: Hyperdense material in the left frontal sulci (series 5, image 47 and 60 and series 3, images 15-18 and 25 area and series 3, image 25), which may represent subarachnoid hemorrhage versus contrast staining. This was not apparent on the immediate postprocedural CT head. Redemonstrated cytotoxic edema in the in the left occipital lobe (series 3, image 14), which is slightly more prominent than on the prior exam. More heterogeneous hypodensity in the right occipital lobe, which is favored to be chronic remote infarcts in the pons and bilateral cerebellar hemispheres. No evidence of acute infarction,  hemorrhage, mass, mass effect, or midline shift. No hydrocephalus or extra-axial fluid collection. Vascular: No hyperdense vessel. Atherosclerotic calcifications in the intracranial carotid and vertebral arteries. Skull: Negative for fracture or focal lesion. Sinuses/Orbits: Mucosal thickening in the ethmoid air cells. No acute finding in the orbits. Status post bilateral lens replacements. Other: The mastoid air cells are well aerated. IMPRESSION: 1. Hyperdense material in the left frontal sulci, which may represent subarachnoid hemorrhage versus contrast staining. This was not apparent on the immediate postprocedural CT head. Recommend short-term follow-up head CT. 2. Redemonstrated cytotoxic edema in the left occipital lobe, which is slightly more prominent than on the prior exam. These results will be called to the ordering clinician or representative by the Radiologist Assistant, and communication documented in the PACS or [employer]. Electronically Signed   By: Wiliam Ke M.D.   On: 01/31/2023 20:52   CT CEREBRAL PERFUSION W CONTRAST Result Date: 01/31/2023 CLINICAL DATA:  Code stroke. 77 year old male. CTA yesterday demonstrating left ICA terminus, distal left vertebral artery, Basilar artery occlusions. EXAM: CT PERFUSION BRAIN TECHNIQUE: Multiphase CT imaging of the brain was performed following IV bolus contrast injection. Subsequent parametric perfusion maps were calculated using RAPID software. RADIATION DOSE REDUCTION: This exam was performed according to the departmental dose-optimization program which includes automated exposure control, adjustment of the mA and/or kV according to patient size and/or use of iterative reconstruction technique. CONTRAST:  40mL OMNIPAQUE IOHEXOL 350 MG/ML SOLN COMPARISON:  Head CTs, CTA head and neck, and brain MRI earlier today. FINDINGS: CT Brain Perfusion Findings: CBF (<30%) Volume: 0mL. No CBF or CBV parameter abnormality is detected. Perfusion  (Tmax>6.0s) volume: .  Fairly symmetric abnormal T-max throughout the bilateral cerebellum, posterior cerebral hemispheres. T-max > 4 seconds is more pronounced in the left cerebral hemisphere including the left MCA territory. There is volume of minimal T-max > 10 seconds in the right posterior circulation. Mismatch Volume: Infarct Core: No infarct core is detected Infarction Location:Oligemia throughout the posterior circulation, to a lesser extent the left MCA territory. IMPRESSION: Oligemia throughout the bilateral posterior circulation, including the posterior fossa. Less pronounced oligemia in the Left MCA territory. No infarct core, CBF or CBV parameter abnormality detected by CTP. These results were communicated to Dr. Roda Shutters at 8:12 am on 01/31/2023 by text page via the [facility] messaging system. Electronically Signed   By: Odessa Fleming M.D.   On: 01/31/2023 08:12   CT HEAD CODE STROKE WO CONTRAST Result Date: 01/31/2023 CLINICAL DATA:  Code stroke. 78 year old male. CTA yesterday demonstrating left ICA terminus, distal left vertebral artery, Basilar artery occlusions. EXAM: CT HEAD WITHOUT CONTRAST TECHNIQUE: Contiguous axial images were obtained from the base of the skull through the vertex without intravenous contrast. RADIATION DOSE REDUCTION: This exam was performed according to the departmental dose-optimization program which includes automated exposure control, adjustment of the mA and/or kV according to patient size and/or use of iterative reconstruction technique. COMPARISON:  Brain MRI 0244 hours today. CTA head and neck 0056 hours today. Plain head CT 0050 hours today. FINDINGS: Brain: Left occipital pole cytotoxic edema is slightly more conspicuous on CT now. Heterogeneous right occipital pole redemonstrated. Chronic left central pontine and bilateral cerebellar infarcts on MRI appears stable by CT. No acute intracranial hemorrhage identified. No new cytotoxic edema compared to the earlier MRI.  No midline shift, mass effect, or evidence of intracranial mass lesion. No ventriculomegaly. Vascular: Extensive Calcified atherosclerosis at the skull base. See also CTA earlier today. Skull: Stable and intact. Sinuses/Orbits: Visualized paranasal sinuses and mastoids are clear. Other: No acute orbit or scalp soft tissue finding. ASPECTS [facility] Stroke Program Early CT Score) Total score (0-10 with 10 being normal): 10; occipital pole cytotoxic edema. IMPRESSION: 1. Stable non contrast CT appearance of the brain since 0050 hours today: Occipital pole Cytotoxic edema stable from the MRI at 0242 hours today. No new edema, no intracranial hemorrhage, or mass effect. 2. 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 Electronically Signed   By: Odessa Fleming M.D.   On: 01/31/2023 08:08   MR BRAIN WO CONTRAST Result Date: 01/31/2023 CLINICAL DATA:  Dizziness and gait instability. Large vessel occlusion. EXAM: MRI HEAD WITHOUT CONTRAST TECHNIQUE: Multiplanar, multiecho pulse sequences of the brain and surrounding structures were obtained without intravenous contrast. COMPARISON:  CTA head neck 01/31/2023 Brain MRI 09/01/2019 FINDINGS: Brain: There are areas of acute/early subacute ischemia within both occipital lobes, left-greater-than-right. These areas correspond to the areas of infarction seen on the earlier noncontrast head CT (suggested to be chronic at that time). There is no acute ischemia of the brainstem, cerebellum or anterior circulation. There is multifocal hyperintense T2-weighted signal within the white matter. Generalized volume loss. There is hyperintense T2-weighted signal that corresponds to both sites of infarction. There is a component of chronic infarct within the superior right occipital lobe (series 11, image 25). There are multiple old cerebellar infarcts. The midline structures are normal. Vascular: Abnormal distal basilar artery flow void in keeping with known occlusion. Abnormal flow  void at the skull base left ICA covers a longer segment than the demonstrated occlusion on the earlier CTA, likely due to inhibited upstream flow.  Skull and upper cervical spine: Normal calvarium and skull base. Visualized upper cervical spine and soft tissues are normal. Sinuses/Orbits:No paranasal sinus fluid levels or advanced mucosal thickening. No mastoid or middle ear effusion. Normal orbits. IMPRESSION: 1. Acute/early subacute infarcts within both occipital lobes, left-greater-than-right. 2. No brainstem, cerebellar or anterior circulation acute ischemia. 3. Abnormal skull base flow voids in keeping with known vascular and left ICA occlusions. 4. Old superior right occipital and bilateral cerebellar infarcts. Electronically Signed   By: Deatra Robinson M.D.   On: 01/31/2023 03:24   CT ANGIO HEAD NECK W WO CM (CODE STROKE) Addendum Date: 01/31/2023 ADDENDUM REPORT: 01/31/2023 02:45 ADDENDUM: In addition to the above described ICA occlusion, the distal basilar artery is occluded. There is reconstitution of the basilar tip with patency of both PCAs maintained. The superior cerebellar arteries are occluded at their origin. Please note that this addendum was previously mistakenly associated with the noncontrast head CT for this patient performed on the same day. Electronically Signed   By: Deatra Robinson M.D.   On: 01/31/2023 02:45   Result Date: 01/31/2023 CLINICAL DATA:  Fall with dysarthria and dizziness EXAM: CT ANGIOGRAPHY HEAD AND NECK WITH AND WITHOUT CONTRAST TECHNIQUE: Multidetector CT imaging of the head and neck was performed using the standard protocol during bolus administration of intravenous contrast. Multiplanar CT image reconstructions and MIPs were obtained to evaluate the vascular anatomy. Carotid stenosis measurements (when applicable) are obtained utilizing NASCET criteria, using the distal internal carotid diameter as the denominator. RADIATION DOSE REDUCTION: This exam was performed  according to the departmental dose-optimization program which includes automated exposure control, adjustment of the mA and/or kV according to patient size and/or use of iterative reconstruction technique. CONTRAST:  75mL OMNIPAQUE IOHEXOL 350 MG/ML SOLN COMPARISON:  None Available. FINDINGS: CTA NECK FINDINGS SKELETON: No acute abnormality or high grade bony spinal canal stenosis. OTHER NECK: Normal pharynx, larynx and major salivary glands. No cervical lymphadenopathy. Unremarkable thyroid gland. UPPER CHEST: No pneumothorax or pleural effusion. No nodules or masses. AORTIC ARCH: There is calcific atherosclerosis of the aortic arch. Conventional 3 vessel aortic branching pattern. RIGHT CAROTID SYSTEM: No dissection, occlusion or aneurysm. There is mixed density atherosclerosis extending into the proximal ICA, resulting in less than 50% stenosis. LEFT CAROTID SYSTEM: No dissection, occlusion or aneurysm. There is mixed density atherosclerosis extending into the proximal ICA, resulting in 60% stenosis. VERTEBRAL ARTERIES: Right dominant configuration. Diminutive left vertebral artery with probable short segment occlusion of the V3 segment. Normal right vertebral artery to the skull base. CTA HEAD FINDINGS POSTERIOR CIRCULATION: Right vertebral artery atherosclerosis with moderate stenosis. Normal left V4 segment. No proximal occlusion of the anterior or inferior cerebellar arteries. Basilar artery is normal. Superior cerebellar arteries are normal. Posterior cerebral arteries are normal. ANTERIOR CIRCULATION: The left ICA is occluded at its terminus within occluded length of approximately 9 mm. The right ICA is mildly atherosclerotic without stenosis. Anterior cerebral arteries are normal. Moderate multifocal stenosis of the M1 segment of the right MCA. The left MCA is patent, likely filled by collateral flow along the circle-of-Willis. There is multifocal mild atherosclerotic irregularity without high-grade  stenosis. VENOUS SINUSES: As permitted by contrast timing, patent. ANATOMIC VARIANTS: None Review of the MIP images confirms the above findings. IMPRESSION: 1. 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 The left MCA is patent, likely filled by collateral flow along the circle-of-Willis. 2. Moderate multifocal stenosis of the M1  segment of the right MCA. 3. Moderate right vertebral artery stenosis. 4. Bilateral carotid bifurcation atherosclerosis with 60% stenosis of the proximal left ICA. 5. Diminutive left vertebral artery with probable short segment occlusion of the V3 segment. Aortic Atherosclerosis (ICD10-I70.0). Critical Value/emergent results were called by telephone at the time of interpretation on 01/31/2023 at 1:17 am to provider [facility] , who verbally acknowledged these results. Electronically Signed: By: Deatra Robinson M.D. 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 There is reconstitution of the basilar tip with patency of both PCAs maintained. The superior cerebellar arteries are occluded at their origin. Electronically Signed   By: Deatra Robinson M.D.   On: 01/31/2023 01:30   Result Date: 01/31/2023 CLINICAL DATA:  Code stroke.  Fall with dizziness EXAM: CT HEAD WITHOUT CONTRAST TECHNIQUE: Contiguous axial images were obtained from the base of the skull through the vertex without intravenous contrast. RADIATION DOSE REDUCTION: This exam was performed according to the departmental dose-optimization program which includes automated exposure control, adjustment of the mA and/or kV according to patient size and/or use of iterative reconstruction technique. COMPARISON:  None Available. FINDINGS: Brain: There is no mass, hemorrhage or extra-axial collection. The  size and configuration of the ventricles and extra-axial CSF spaces are normal. There are multiple old cerebellar small vessel infarcts. Bilateral PCA territory infarctions also appear chronic. There is mild white matter hypoattenuation. Vascular: Atherosclerosis of the carotid and vertebral arteries at the skull base. Skull: Normal Sinuses/Orbits: No fluid levels or advanced mucosal thickening of the visualized paranasal sinuses. No mastoid or middle ear effusion. The orbits are normal. ASPECTS [facility] Stroke Program Early CT Score) - Ganglionic level infarction (caudate, lentiform nuclei, internal capsule, insula, M1-M3 cortex): 7 - Supraganglionic infarction (M4-M6 cortex): 3 Total score (0-10 with 10 being normal): 10 IMPRESSION: 1. No acute intracranial abnormality. 2. ASPECTS is 10. 3. 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 4. Bilateral PCA territory infarctions also appear chronic. These results were called by telephone at the time of interpretation on 01/31/2023 at 1:00 am to provider [facility] , who verbally acknowledged these results. Electronically Signed: By: Deatra Robinson M.D. On: 01/31/2023 01:00   CT C-SPINE NO CHARGE Result Date: 01/31/2023 CLINICAL DATA:  Neck pain, known left ICA occlusion EXAM: CT CERVICAL SPINE WITHOUT CONTRAST TECHNIQUE: Multidetector CT imaging of the cervical spine was performed without intravenous contrast. Multiplanar CT image reconstructions were also generated. RADIATION DOSE REDUCTION: This exam was performed according to the departmental dose-optimization program which includes automated exposure control, adjustment of the mA and/or kV according to patient size and/or use of iterative reconstruction technique. COMPARISON:  None Available. FINDINGS: Alignment: Within normal limits. Skull base and vertebrae: 7 cervical segments are well visualized. Vertebral body height is well maintained. Mild osteophytic change and facet hypertrophic changes noted. No  acute fracture or acute facet abnormality is noted. The odontoid is within normal limits. Soft tissues and spinal canal: Surrounding soft tissue structures appear within normal limits. Upper chest: Visualized lung apices are unremarkable with the exception of some scarring in the right apex. Other: None IMPRESSION: Multilevel degenerative change without acute bony abnormality. Electronically Signed   By: Alcide Clever M.D.   On: 01/31/2023 01:25     PHYSICAL EXAM  Temp:  [98.1 F (36.7 C)-100 F (37.8 C)] 98.1 F (36.7 C) (12/17 0835) Pulse  Rate:  [78-87] 86 (12/17 0835) Resp:  [18-20] 18 (12/17 0835) BP: (121-140)/(62-83) 130/78 (12/17 0835) SpO2:  [93 %-97 %] 93 % (12/17 0835)  General -well-nourished, well-developed elderly patient in no acute distress Cardiovascular - Regular rhythm and rate..  Neuro -Awake and alert.  Patient will respond to name and intermittently follow commands.  He has left gaze preference but can look to the right past midline he blinks to threat on the right but not on the left.  He has an apparent left hemianopsia. He will move all 4 extremities with antigravity strength.  ASSESSMENT/PLAN Grant Berry is a 77 y.o. male with history of hypertension, hyperlipidemia, stroke admitted for multiple falls. No tPA given due to outside window.    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  Left ICA proximal 60% stenosis, right multifocal M1 moderate stenosis, right VA stenosis. MRI left MCA/PCA small to moderate infarct.  Old right PCA infarct. Due to developing expressive aphasia, repeat CT showed no acute change CTP no core infarct, increased TTP at posterior circulation  and left MCA territory Status post IR with terminal ICA stenting and proximal ICA angioplasty with TICI3 reperfusion Post IR CT repeat left PCA progressive infarct. Hyperdense material in the left frontal sulci, which may represent subarachnoid hemorrhage versus contrast staining. MRI repeat Moderate acute infarct in the left occipital lobe with mild progression from yesterday. Interval punctate acute infarct in the left frontal white matter. 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 MRI repeat 12/12 showed Interval hemorrhagic transformation with a 2.7 x 1.5 cm hematoma at the left occipital lobe. Additional new acute intraparenchymal hemorrhage at the posterior left basal ganglia measuring 2.2 x 1.9 cm CT repeat done 12/14 and stable.  2D Echo EF 60 to 65% LDL 167 HgbA1c 5.1 UDS negative Lovenox for VTE prophylaxis No antithrombotic prior to admission, now on aspirin 81 mg daily and clopidogrel 75 mg daily for intracranial stenting.  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 Bilateral P-comm and PCA patent Avoid low BP No intervention needed at this time, discussed with Dr. Corliss Skains  History of stroke 08/2019 admitted for dizziness, imbalance and leaning towards to the left.  CT showed left cerebellar infarct which confirmed on MRI.  MRI showed left SCA occlusion, left VA hypoplastic.  2D echo unremarkable.  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  Discharged on DAPT and Lipitor. Follow-up with Dr. Pearlean Brownie at [facility]  Delirium, improving Less restless and agitation now Tele sitter is on at night, will try to discontinue today On seroquel 25 bid -> 25 am and 50 hs  Hx of  hypertension Hypotension post procedure Stable now S/p albumin Avoid low BP Long term BP goal normotensive  Hyperlipidemia Home meds: Zetia LDL 167, goal < 70 Statin intolerance (Lipitor and pravastatin) Continue Zetia at discharge Not candidate for Leqvio   Other Stroke Risk Factors Advanced age Former smoker  Other Active Problems AKI, creatinine 1.40--1.06--1.07--1.21 --1.21--1.09, encourage  po intake  Patient seen by NP with MD, MD to edit note as needed. Cortney E Ernestina Columbia , MSN, AGACNP-BC [facility] See Amion for schedule and pager information 02/09/2023 12:34 PM  I have personally obtained history,examined this patient, reviewed notes, independently viewed imaging studies, participated in medical decision making and plan of care.ROS completed by me personally and pertinent positives fully documented  I have made any additions or clarifications directly to the above note. Agree with note above.  Medically stable to be transferred to skilled nursing facility for rehab when bed available  Delia Heady, MD Medical Director [facility] Pager: [phone] 02/09/2023 2:50 PM

## 2023-02-09 NOTE — Progress Notes (Signed)
Speech Language Pathology Treatment: Cognitive-Linquistic  Patient Details Name: Grant Berry MRN: [id number] DOB: November 25, 1945 Today's Date: 02/09/2023 Time: 4782-9562 SLP Time Calculation (min) (ACUTE ONLY): 11 min  Assessment / Plan / Recommendation Clinical Impression  Pt's speech intelligibility has improved since previous visit. Reinforced use of compensatory strategies. He participated in a sequencing task given Min cueing for attention. Recommend ongoing SLP f/u which can be addressed at his next venue of care. Will s/o acutely.    HPI HPI: Grant Berry is a 77 yo male presenting to ED 12/8 with multiple falls. MRI Brain showed L MCA/PCA infarct, chronic R PCA infarct. CTA Head and Neck with mid-basilar artery occlusion, L ICA 60% stenosis, L ICA terminal occlusion vs high-grade stenosis. Underwent bilat common carotid arteriograms R CFA approach, complete revascularization of L ICA supraclinoid segment, treated intracranial arteriosclerotic related stenosis, angioplasty of high grade L ICA proximal stenosis 12/8. PMH includes HTN, HLD, prior L MCA stroke with residual LUE and LLE ataxia.      SLP Plan  Continue with current plan of care      Recommendations for follow up therapy are one component of a multi-disciplinary discharge planning process, led by the attending physician.  Recommendations may be updated based on patient status, additional functional criteria and insurance authorization.    Recommendations                         Frequent or constant Supervision/Assistance Dysarthria and anarthria (R47.1);Apraxia (R48.2);Cognitive communication deficit (Z30.865)     Continue with current plan of care     Gwynneth Aliment, M.A., CF-SLP Speech Language Pathology, Acute Rehabilitation Services  Secure Chat preferred [phone]   02/09/2023, 1:56 PM

## 2023-02-09 NOTE — Plan of Care (Signed)
Telesitter Dc'd at 12:42, bedalarm on, DC to SNF.   Problem: Education: Goal: Knowledge of disease or condition will improve Outcome: Not Met (add Reason) Goal: Knowledge of secondary prevention will improve (MUST DOCUMENT ALL) Outcome: Not Met (add Reason) Goal: Knowledge of patient specific risk factors will improve Loraine Leriche N/A or DELETE if not current risk factor) Outcome: Not Met (add Reason)   Problem: Ischemic Stroke/TIA Tissue Perfusion: Goal: Complications of ischemic stroke/TIA will be minimized Outcome: Not Met (add Reason)   Problem: Coping: Goal: Will verbalize positive feelings about self Outcome: Not Met (add Reason) Goal: Will identify appropriate support needs Outcome: Not Met (add Reason)   Problem: Health Behavior/Discharge Planning: Goal: Ability to manage health-related needs will improve Outcome: Not Met (add Reason) Goal: Goals will be collaboratively established with patient/family Outcome: Not Met (add Reason)   Problem: Self-Care: Goal: Ability to participate in self-care as condition permits will improve Outcome: Not Met (add Reason) Goal: Verbalization of feelings and concerns over difficulty with self-care will improve Outcome: Not Met (add Reason) Goal: Ability to communicate needs accurately will improve Outcome: Not Met (add Reason)   Problem: Nutrition: Goal: Risk of aspiration will decrease Outcome: Not Met (add Reason) Goal: Dietary intake will improve Outcome: Not Met (add Reason)   Problem: Education: Goal: Understanding of CV disease, CV risk reduction, and recovery process will improve Outcome: Not Met (add Reason) Goal: Individualized Educational Video(s) Outcome: Not Met (add Reason)   Problem: Activity: Goal: Ability to return to baseline activity level will improve Outcome: Not Met (add Reason)   Problem: Cardiovascular: Goal: Ability to achieve and maintain adequate cardiovascular perfusion will improve Outcome: Not Met  (add Reason) Goal: Vascular access site(s) Level 0-1 will be maintained Outcome: Not Met (add Reason)   Problem: Health Behavior/Discharge Planning: Goal: Ability to safely manage health-related needs after discharge will improve Outcome: Not Met (add Reason)   Problem: Education: Goal: Knowledge of General Education information will improve Description: Including pain rating scale, medication(s)/side effects and non-pharmacologic comfort measures Outcome: Not Met (add Reason)   Problem: Health Behavior/Discharge Planning: Goal: Ability to manage health-related needs will improve Outcome: Not Met (add Reason)   Problem: Clinical Measurements: Goal: Ability to maintain clinical measurements within normal limits will improve Outcome: Not Met (add Reason) Goal: Will remain free from infection Outcome: Not Met (add Reason) Goal: Diagnostic test results will improve Outcome: Not Met (add Reason) Goal: Respiratory complications will improve Outcome: Not Met (add Reason) Goal: Cardiovascular complication will be avoided Outcome: Not Met (add Reason)   Problem: Activity: Goal: Risk for activity intolerance will decrease Outcome: Not Met (add Reason)   Problem: Nutrition: Goal: Adequate nutrition will be maintained Outcome: Not Met (add Reason)   Problem: Coping: Goal: Level of anxiety will decrease Outcome: Not Met (add Reason)   Problem: Elimination: Goal: Will not experience complications related to bowel motility Outcome: Not Met (add Reason) Goal: Will not experience complications related to urinary retention Outcome: Not Met (add Reason)   Problem: Pain Management: Goal: General experience of comfort will improve Outcome: Not Met (add Reason)   Problem: Safety: Goal: Ability to remain free from injury will improve Outcome: Not Met (add Reason)   Problem: Skin Integrity: Goal: Risk for impaired skin integrity will decrease Outcome: Not Met (add Reason)

## 2023-02-09 NOTE — Plan of Care (Signed)
  Problem: Education: Goal: Knowledge of disease or condition will improve Outcome: Progressing Goal: Knowledge of secondary prevention will improve (MUST DOCUMENT ALL) Outcome: Progressing Goal: Knowledge of patient specific risk factors will improve Loraine Leriche N/A or DELETE if not current risk factor) Outcome: Progressing   Problem: Self-Care: Goal: Ability to participate in self-care as condition permits will improve Outcome: Progressing Goal: Verbalization of feelings and concerns over difficulty with self-care will improve Outcome: Progressing Goal: Ability to communicate needs accurately will improve Outcome: Progressing   Problem: Nutrition: Goal: Dietary intake will improve Outcome: Progressing   Problem: Activity: Goal: Ability to return to baseline activity level will improve Outcome: Progressing   Problem: Safety: Goal: Ability to remain free from injury will improve Outcome: Progressing

## 2023-02-10 NOTE — Plan of Care (Signed)
   Problem: Education: Goal: Knowledge of disease or condition will improve Outcome: Progressing Goal: Knowledge of secondary prevention will improve (MUST DOCUMENT ALL) Outcome: Progressing Goal: Knowledge of patient specific risk factors will improve Loraine Leriche N/A or DELETE if not current risk factor) Outcome: Progressing   Problem: Ischemic Stroke/TIA Tissue Perfusion: Goal: Complications of ischemic stroke/TIA will be minimized Outcome: Progressing   Problem: Coping: Goal: Will verbalize positive feelings about self Outcome: Progressing Goal: Will identify appropriate support needs Outcome: Progressing   Problem: Health Behavior/Discharge Planning: Goal: Ability to manage health-related needs will improve Outcome: Progressing Goal: Goals will be collaboratively established with patient/family Outcome: Progressing   Problem: Self-Care: Goal: Ability to participate in self-care as condition permits will improve Outcome: Progressing Goal: Verbalization of feelings and concerns over difficulty with self-care will improve Outcome: Progressing Goal: Ability to communicate needs accurately will improve Outcome: Progressing   Problem: Nutrition: Goal: Risk of aspiration will decrease Outcome: Progressing Goal: Dietary intake will improve Outcome: Progressing   Problem: Education: Goal: Understanding of CV disease, CV risk reduction, and recovery process will improve Outcome: Progressing Goal: Individualized Educational Video(s) Outcome: Progressing   Problem: Activity: Goal: Ability to return to baseline activity level will improve Outcome: Progressing   Problem: Cardiovascular: Goal: Ability to achieve and maintain adequate cardiovascular perfusion will improve Outcome: Progressing Goal: Vascular access site(s) Level 0-1 will be maintained Outcome: Progressing   Problem: Health Behavior/Discharge Planning: Goal: Ability to safely manage health-related needs after  discharge will improve Outcome: Progressing   Problem: Education: Goal: Knowledge of General Education information will improve Description: Including pain rating scale, medication(s)/side effects and non-pharmacologic comfort measures Outcome: Progressing   Problem: Health Behavior/Discharge Planning: Goal: Ability to manage health-related needs will improve Outcome: Progressing   Problem: Clinical Measurements: Goal: Ability to maintain clinical measurements within normal limits will improve Outcome: Progressing Goal: Will remain free from infection Outcome: Progressing Goal: Diagnostic test results will improve Outcome: Progressing Goal: Respiratory complications will improve Outcome: Progressing Goal: Cardiovascular complication will be avoided Outcome: Progressing   Problem: Activity: Goal: Risk for activity intolerance will decrease Outcome: Progressing   Problem: Nutrition: Goal: Adequate nutrition will be maintained Outcome: Progressing   Problem: Coping: Goal: Level of anxiety will decrease Outcome: Progressing   Problem: Elimination: Goal: Will not experience complications related to bowel motility Outcome: Progressing Goal: Will not experience complications related to urinary retention Outcome: Progressing   Problem: Pain Management: Goal: General experience of comfort will improve Outcome: Progressing   Problem: Safety: Goal: Ability to remain free from injury will improve Outcome: Progressing   Problem: Skin Integrity: Goal: Risk for impaired skin integrity will decrease Outcome: Progressing

## 2023-02-10 NOTE — TOC Progression Note (Signed)
Transition of Care [facility]) - Progression Note    Patient Details  Name: Grant Berry MRN: [id number] Date of Birth: March 15, 1945  [facility] CM/SW Contact  Baldemar Lenis, [place] Phone Number: 02/10/2023, 11:29 AM  Clinical Narrative:   Patient now without sitter for 24 hours. CSW updated [facility], awaiting response on when they could admit. CSW requested CMA to initiate insurance authorization. CSW to follow.    Expected Discharge Plan: Skilled Nursing Facility Barriers to Discharge: Continued Medical Work up, English as a second language teacher  Expected Discharge Plan and Services     Post Acute Care Choice: Skilled Nursing Facility Living arrangements for the past 2 months: Single Family Home                                       Social Determinants of Health (SDOH) Interventions SDOH Screenings   Food Insecurity: No Food Insecurity (01/31/2023)  Housing: Low Risk  (02/04/2023)  Transportation Needs: No Transportation Needs (01/31/2023)  Utilities: Not At Risk (01/31/2023)  Alcohol Screen: Low Risk  (08/03/2022)  Depression (PHQ2-9): Low Risk  (10/23/2022)  Financial Resource Strain: Low Risk  (08/03/2022)  Physical Activity: Insufficiently Active (08/03/2022)  Social Connections: Socially Isolated (08/03/2022)  Stress: No Stress Concern Present (08/03/2022)  Tobacco Use: Medium Risk (01/31/2023)    Readmission Risk Interventions     No data to display

## 2023-02-10 NOTE — Progress Notes (Signed)
Occupational Therapy Treatment Patient Details Name: Grant Berry MRN: [id number] DOB: 1945-12-23 Today's Date: 02/10/2023   History of present illness Pt is a 77 y.o. male who presented 01/31/23 s/p multiple falls.  MRI showed left MCA/PCA infarct, chronic right PCA infarct.  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 S/p bilat common carotid arteriograms R CFA approach, complete revascularization of L ICA supraclinoid segment, treated intracranial arteriosclerotic related stenosis, angioplasty of high grade L ICA proximal stenosis 12/8. PMH: L MCA CVA, HLD, HTN   OT comments  Pt making steady progress towards OT goals this session. Pt continues to present with decreased activity tolerance, R sided weakness and impaired balance and endurance . Pt currently requires MIN A +2 for safety for ADL transfers with RW. Pt continues to be impulsive with mobility reporting that he needed to sit on BSC to void bowels although pt unable to void during session. Pt limited by impaired cognition with pt disoriented to date, year, month even with cue provided about the approaching holiday. Pt would continue to benefit from skilled occupational therapy while admitted and after d/c to address the below listed limitations in order to improve overall functional mobility and facilitate independence with BADL participation. DC plan remains appropriate, will follow acutely per POC.         If plan is discharge home, recommend the following:  Two people to help with walking and/or transfers;A lot of help with bathing/dressing/bathroom;Assistance with feeding;Direct supervision/assist for medications management;Direct supervision/assist for financial management;Assist for transportation;Help with stairs or ramp for entrance;Supervision due to cognitive status   Equipment Recommendations  Other (comment) (defer to next venue of  care)    Recommendations for Other Services      Precautions / Restrictions Precautions Precautions: Fall Restrictions Weight Bearing Restrictions Per Provider Order: No       Mobility Bed Mobility Overal bed mobility: Needs Assistance Bed Mobility: Supine to Sit     Supine to sit: Contact guard, HOB elevated, Used rails     General bed mobility comments: CGA for safety with + time and effort d/t reports of dizziness    Transfers Overall transfer level: Needs assistance Equipment used: Rolling walker (2 wheels) Transfers: Sit to/from Stand Sit to Stand: Min assist, +2 safety/equipment Stand pivot transfers: Min assist, +2 safety/equipment         General transfer comment: pt completed multiple sit>stands from EOB and BSC with MIN A +2 for safety d/t impulsivity, stand pivot to [facility] and recliner with Rw and MIN A +2 for safety with balance and RW mgmt     Balance Overall balance assessment: Needs assistance, History of Falls Sitting-balance support: Bilateral upper extremity supported, Feet supported Sitting balance-Leahy Scale: Fair     Standing balance support: Bilateral upper extremity supported, Reliant on assistive device for balance Standing balance-Leahy Scale: Poor                             ADL either performed or assessed with clinical judgement   ADL Overall ADL's : Needs assistance/impaired                 Upper Body Dressing : Minimal assistance;Sitting Upper Body Dressing Details (indicate cue type and reason): to don back side gown     Toilet Transfer: Minimal assistance;BSC/3in1;Stand-pivot;Rolling walker (2 wheels) Toilet Transfer Details (indicate cue type and reason): stand  pivot to [facility] with Rw, pt impulsively stating that he needed to have BM however unable to void even with increased time provided         Functional mobility during ADLs: Minimal assistance;Rolling walker (2 wheels);Cueing for safety;Cueing for  sequencing;+2 for safety/equipment General ADL Comments: ADL participation impacted by impaired balance, anxiety with mobility and impulsivity    Extremity/Trunk Assessment Upper Extremity Assessment Upper Extremity Assessment: Generalized weakness;RUE deficits/detail RUE Deficits / Details: decreased AROM in R shoulder to 100* RUE Sensation: decreased proprioception RUE Coordination: decreased fine motor;decreased gross motor   Lower Extremity Assessment Lower Extremity Assessment: Defer to PT evaluation;Generalized weakness   Cervical / Trunk Assessment Cervical / Trunk Assessment: Kyphotic    Vision Baseline Vision/History: 1 Wears glasses Ability to See in Adequate Light: 0 Adequate Patient Visual Report: No change from baseline (per gross assessment, not fully assessed)     Perception     Praxis      Cognition Arousal: Alert Behavior During Therapy: Anxious Overall Cognitive Status: No family/caregiver present to determine baseline cognitive functioning Area of Impairment: Orientation, Attention, Memory, Following commands, Safety/judgement, Problem solving, Awareness                 Orientation Level: Disoriented to, Time, Situation Current Attention Level: Focused Memory: Decreased recall of precautions, Decreased short-term memory Following Commands: Follows one step commands with increased time, Follows multi-step commands inconsistently Safety/Judgement: Decreased awareness of safety, Decreased awareness of deficits Awareness: Emergent Problem Solving: Slow processing, Decreased initiation, Difficulty sequencing, Requires verbal cues General Comments: increased anxiety with mobility, slow to initiate mobility tasks, no awareness to orientation even when asked what holiday is approaching pt states "Thanksgiving"        Exercises      Shoulder Instructions       General Comments unable to void bowels despite lengthy time spent on [facility]    Pertinent  Vitals/ Pain       Pain Assessment Pain Assessment: Faces Faces Pain Scale: No hurt  Home Living                                          Prior Functioning/Environment              Frequency  Min 1X/week        Progress Toward Goals  OT Goals(current goals can now be found in the care plan section)  Progress towards OT goals: Progressing toward goals  Acute Rehab OT Goals Patient Stated Goal: to sleep OT Goal Formulation: With patient Time For Goal Achievement: 02/16/23 Potential to Achieve Goals: Fair  Plan      Co-evaluation                 AM-PAC OT "6 Clicks" Daily Activity     Outcome Measure   Help from another person eating meals?: A Little Help from another person taking care of personal grooming?: A Little Help from another person toileting, which includes using toliet, bedpan, or urinal?: A Lot Help from another person bathing (including washing, rinsing, drying)?: A Lot Help from another person to put on and taking off regular upper body clothing?: A Lot Help from another person to put on and taking off regular lower body clothing?: A Lot 6 Click Score: 14    End of Session Equipment Utilized During Treatment: Gait belt;Rolling walker (2 wheels)  OT  Visit Diagnosis: Unsteadiness on feet (R26.81);Repeated falls (R29.6);Muscle weakness (generalized) (M62.81);History of falling (Z91.81);Ataxia, unspecified (R27.0);Other symptoms and signs involving cognitive function   Activity Tolerance Patient tolerated treatment well   Patient Left in chair;with call bell/phone within reach;with nursing/sitter in room   Nurse Communication Mobility status (nurse present during majority of session)        Time: 2025-4270 OT Time Calculation (min): 21 min  Charges: OT General Charges $OT Visit: 1 Visit OT Treatments $Self Care/Home Management : 8-22 mins  Lenor Derrick., COTA/L Acute Rehabilitation Services [phone]   Barron Schmid 02/10/2023, 11:48 AM

## 2023-02-10 NOTE — Progress Notes (Addendum)
STROKE TEAM PROGRESS NOTE   SUBJECTIVE (INTERVAL HISTORY) Patient is seen in his room with no family at the bedside.  His neurological exam is stable, and he is hemodynamically stable and afebrile. Awaiting insurance authorization for SNF  OBJECTIVE Temp:  [97.7 F (36.5 C)-98.9 F (37.2 C)] 97.7 F (36.5 C) (12/18 0825) Pulse Rate:  [78-90] 79 (12/18 0825) Cardiac Rhythm: Normal sinus rhythm (12/18 0800) Resp:  [16-18] 18 (12/18 0825) BP: (124-143)/(71-86) 136/77 (12/18 0825) SpO2:  [95 %-100 %] 100 % (12/18 0825)  No results for input(s): "GLUCAP" in the last 168 hours.  Recent Labs  Lab 02/04/23 0727 02/05/23 0624 02/06/23 0557 02/09/23 0538  NA 136 138 135 134*  K 3.5 3.5 3.9 4.1  CL 101 104 101 101  CO2 27 24 25 26   GLUCOSE 103* 98 98 106*  BUN 16 14 13 18   CREATININE 1.21 1.09 1.05 1.14  CALCIUM 8.7* 8.8* 8.7* 8.7*   No results for input(s): "AST", "ALT", "ALKPHOS", "BILITOT", "PROT", "ALBUMIN" in the last 168 hours.  Recent Labs  Lab 02/04/23 0727 02/05/23 0624 02/06/23 0557 02/09/23 0538  WBC 7.5 5.6 6.1 8.4  HGB 10.3* 9.5* 9.2* 9.9*  HCT 30.9* 28.9* 27.1* 30.1*  MCV 90.1 89.2 88.9 89.9  PLT 188 190 208 319        Component Value Date/Time   CHOL 235 (H) 01/31/2023 0454   CHOL 202 (H) 07/10/2022 1012   CHOL 219 (H) 06/17/2012 1620   TRIG 47 01/31/2023 0454   TRIG 215 (H) 06/17/2012 1620   HDL 59 01/31/2023 0454   HDL 48 07/10/2022 1012   HDL 34 (L) 06/17/2012 1620   CHOLHDL 4.0 01/31/2023 0454   VLDL 9 01/31/2023 0454   LDLCALC 167 (H) 01/31/2023 0454   LDLCALC 134 (H) 07/10/2022 1012   LDLCALC 142 (H) 06/17/2012 1620   Lab Results  Component Value Date   HGBA1C 5.1 01/31/2023      Component Value Date/Time   LABOPIA NONE DETECTED 01/31/2023 0039   COCAINSCRNUR NONE DETECTED 01/31/2023 0039   LABBENZ NONE DETECTED 01/31/2023 0039   AMPHETMU NONE DETECTED 01/31/2023 0039   THCU NONE DETECTED 01/31/2023 0039   LABBARB NONE DETECTED  01/31/2023 0039    No results for input(s): "ETH" in the last 168 hours.   I have personally reviewed the radiological images below and agree with the radiology interpretations.  CT HEAD WO CONTRAST ( ) Result Date: 02/06/2023 CLINICAL DATA:  77 year old male code stroke presentation on 01/31/2023 with multiple intracranial occlusions. Status post endovascular reperfusion, stenting. Left lentiform hemorrhagic transformation in the left basal ganglia and occipital lobe. EXAM: CT HEAD WITHOUT CONTRAST TECHNIQUE: Contiguous axial images were obtained from the base of the skull through the vertex without intravenous contrast. RADIATION DOSE REDUCTION: This exam was performed according to the departmental dose-optimization program which includes automated exposure control, adjustment of the mA and/or kV according to patient size and/or use of iterative reconstruction technique. COMPARISON:  Brain MRI 02/04/2023. FINDINGS: Brain: Oval left lentiform hemorrhage is 25 x 17 by 32 mm (AP by transverse by CC) for estimated volume of 7 mL and appears mildly larger since the MRI on 02/04/2023. Regional hypodense edema. Oval left occipital pole cortical and subcortical hyperdense hemorrhage superimposed on confluent cytotoxic edema there is 19 x 42 x 14 mm (AP by transverse by CC) for an estimated blood volume of 6 mL, stable since 02/04/2023. No intraventricular blood. Trace additional left hemisphere subarachnoid hemorrhage suspected such as on  sagittal image 43 left superior frontal gyrus. No midline shift or significant intracranial mass effect. Mild mass effect on the left lateral ventricle is stable. No ventriculomegaly. Underlying scattered chronic cerebral encephalomalacia including in the posterior fossa. No new cortically based infarct identified. Vascular: Distal left ICA stent. Calcified atherosclerosis at the skull base. Skull: Stable, intact. Sinuses/Orbits: Visualized paranasal sinuses and mastoids are  stable and well aerated. Other: No acute orbit or scalp soft tissue finding. IMPRESSION: 1. Left lentiform hemorrhage estimated at 7 mL appears slightly larger since 02/04/2023. Hemorrhagic transformation of the left PCA infarct estimated at 6 mL is stable. Trace left hemisphere subarachnoid hemorrhage suspected. 2. No midline shift or significant intracranial mass effect. No new intracranial abnormality. Multifocal chronic ischemic disease. Electronically Signed   By: Odessa Fleming M.D.   On: 02/06/2023 09:44   MR BRAIN WO CONTRAST Addendum Date: 02/04/2023 ADDENDUM REPORT: 02/04/2023 21:09 ADDENDUM: Results were communicated by telephone at the time of interpretation on 02/04/2023 at 9 p.m. to provider Dr. Otelia Limes, who verbally acknowledged these results. Electronically Signed   By: Rise Mu M.D.   On: 02/04/2023 21:09   Result Date: 02/04/2023 CLINICAL DATA:  Follow-up examination for stroke. EXAM: MRI HEAD WITHOUT CONTRAST TECHNIQUE: Multiplanar, multiecho pulse sequences of the brain and surrounding structures were obtained without intravenous contrast. COMPARISON:  Previous MRI from 02/01/2023 and CT from 01/31/2023 FINDINGS: Brain: Atrophy with chronic microvascular ischemic disease. Evolving late subacute right PCA distribution infarct involving the right occipital lobe. Multifocal chronic infarcts involving the left greater than right cerebellum. Few scattered remote lacunar infarcts about the basal ganglia and pons. Continued interval evolution of subacute left PCA distribution infarct, similar in size as compared to previous. Few additional patchy foci of all vein subacute ischemia noted superiorly within the subcortical posterior left frontal parietal region, also similar. There has been evidence for hemorrhagic transformation with a hematoma measuring 2.7 x 1.5 cm now seen at the left occipital lobe (series 14, image 22). Additional new acute intraparenchymal hemorrhage at the posterior  left basal ganglia measures approximately 2.2 x 1.9 cm (series 14, image 32). No significant regional mass effect. No intraventricular extension. Additional small volume subarachnoid hemorrhage at the anterior left frontal convexity, slightly less conspicuous as compared to prior. Additional scattered chronic micro hemorrhages noted, stable. No other new or interval infarction elsewhere. 3.7 cm benign arachnoid cyst at the left frontal vertex again noted. No other mass lesion or midline shift. No hydrocephalus. No extra-axial fluid collection. Vascular: Loss of normal flow void within the left vertebral artery, stable. Heterogeneous but preserved flow voids within the remaining posterior circulation. Normal flow voids seen within the anterior circulation. Skull and upper cervical spine: Craniocervical junction within normal limits. Bone marrow signal intensity normal. No scalp soft tissue abnormality. Sinuses/Orbits: Prior bilateral ocular lens replacement. Paranasal sinuses remain largely clear. No significant mastoid effusion. Other: 9 mm T2 hyperintense lesion within the right parotid gland (series 15, image 21), indeterminate. IMPRESSION: 1. Continued interval evolution of subacute left PCA distribution infarct, similar in size as compared to previous. Interval hemorrhagic transformation with a 2.7 x 1.5 cm hematoma at the left occipital lobe. Additional new acute intraparenchymal hemorrhage at the posterior left basal ganglia measuring 2.2 x 1.9 cm. No significant regional mass effect. No intraventricular extension. 2. Additional small volume subarachnoid hemorrhage at the anterior left frontal convexity, slightly less conspicuous as compared to prior. 3. Evolving late subacute right PCA distribution infarct, stable. 4. Underlying atrophy with chronic microvascular  ischemic disease, with multiple chronic infarcts as above. 5. 9 mm T2 hyperintense lesion within the right parotid gland, indeterminate. Correlation  with nonemergent outpatient ultrasound suggested for further evaluation. The covering neurohospitalist has been paged regarding these findings, currently awaiting a call back. Results will be conveyed as soon as possible. Electronically Signed: By: Rise Mu M.D. On: 02/04/2023 20:59   IR PERCUTANEOUS ART THROMBECTOMY/INFUSION INTRACRANIAL INC DIAG ANGIO Result Date: 02/02/2023 INDICATION: History of repeated falls. Patient with new onset expressive aphasia and possible right sided weakness. 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 Also noted mid basilar artery occlusion. EXAM: 1. EMERGENT LARGE VESSEL OCCLUSION THROMBOLYSIS (anterior CIRCULATION) COMPARISON:  Recent CT angiogram of the head and neck, and MRI of the brain. MEDICATIONS: Ancef 2 g IV was administered within 1 hour of the procedure. ANESTHESIA/SEDATION: General anesthesia. CONTRAST:  Omnipaque 300 approximately 110 mL. FLUOROSCOPY TIME:  Fluoroscopy Time: 31 minutes 42 seconds (1110 mGy). COMPLICATIONS: None immediate. TECHNIQUE: Following a full explanation of the procedure along with the potential associated complications, an informed witnessed consent was obtained. The risks of intracranial hemorrhage of 10%, worsening neurological deficit, ventilator dependency, death and inability to revascularize were all reviewed in detail with the patient. The patient was then put under general anesthesia by the Department of Anesthesiology at [facility]. The right groin was prepped and draped in the usual sterile fashion. Thereafter using modified Seldinger technique, transfemoral access into the right common femoral artery was obtained without difficulty. Over an 0.035 inch guidewire an 8 French 25 cm Pinnacle sheath was inserted. Through this, and also over an 0.035 inch guidewire a combination of a 125 cm 6 [place] Berenstein support catheter inside of an  087 95 cm balloon guide catheter was advanced to the aortic arch region and advanced to the distal left common carotid artery. The guidewire, and the support catheter were removed. Good aspiration was obtained from the hub of the balloon guide catheter. A control arteriogram was then performed centered extra cranially and intracranially. FINDINGS: The left common carotid arteriogram demonstrates the left external carotid artery and its major branches to be widely patent. The left internal carotid artery just distal to the bulb has a focal severe stenosis. 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 PROCEDURE: Through the balloon guide catheter in the distal left ICA, and over an 014 inch 300 cm exchange micro guidewire positioned in the horizontal petrous segment, control angioplasty of the proximal left internal carotid stenosis was performed with a 4 mm x 30 cm Viatrac 14 angioplasty balloon catheter which had been prepped with 50% contrast and 50% heparinized saline infusion. Using the rapid exchange technique, the balloon was positioned with its markers adequate distant from the site of severe stenosis. A control inflation was then performed using a micro inflation syringe device via micro tubing to approximately 4 mm where it was maintained for approximately a minute and a half. Thereafter, following deflation of the balloon and removal a control arteriogram performed through the balloon guide catheter demonstrated now improved caliber of the angioplastied segment. At this time a 071 Zoom aspiration catheter was advanced in combination with an 021 160 cm Trevo ProVue microcatheter over the exchange micro guidewire to the proximal petrous segment. The 300 cm 014 exchange  micro guidewire was then exchanged for an 018 inch micro guidewire with a moderate J  configuration distally. The micro guidewire with the microcatheter was then gently advanced without difficulty through the occluded supraclinoid left ICA to the middle cerebral artery inferior division M2 segment followed by the microcatheter. The guidewire was removed. Good aspiration obtained from the hub of the microcatheter. A gentle control arteriogram performed through this demonstrated safe positioning of the tip of the microcatheter. A 4 mm x 40 mm Solitaire X retrieval device was then deployed with the proximal portion in the supraclinoid left ICA. At this time the Zoom aspiration catheter was advanced without difficulty to the mid M1 segment. Control aspiration was then performed for approximately a minute and a half at the hub of the Zoom aspiration catheter, and with a 20 mL syringe at the hub of the balloon guide catheter for approximately a minute and a half. The combination was then retrieved and removed. Following reversal of flow arrest in the left internal carotid artery a control arteriogram performed demonstrated complete revascularization of the supraclinoid left ICA with opacification of the left middle cerebral artery achieving a TICI 3 revascularization. Also noted was opacification of the proximal left A1 segment with focal areas of severe stenosis due to arteriosclerosis. Bilateral PCOMs demonstrated opacification into the left posterior cerebral artery P1 segment. Underlying intracranial arteriosclerosis with resulting severe stenosis was noted just proximal to the origins of the posterior communicating arteries. Through the balloon guide catheter in the distal left ICA, an aspiration 5 French 115 cm guide catheter was then advanced to the proximal cavernous segment over an 035 inch guidewire. Through this, 160 cm Trevo ProVue microcatheter was advanced over an 018 inch micro guidewire. The microcatheter and the micro guidewire were advanced to the distal M2 M3 regions of the inferior  division followed by the microcatheter. The guidewire was removed. Good aspiration obtained from the hub of the microcatheter. This in turn was exchanged for an 014 inch 300 cm exchange micro guidewire with a moderate J configuration at its distal end. A control arteriogram performed demonstrated safe positioning of the tip of the micro guidewire. Measurements were then performed of the left ICA supraclinoid segment just proximal to the bifurcation and just distal to the origin of the ophthalmic artery. A 3 mm x 12 mm Onyx Frontier balloon mounted stent which had been prepped antegradely with 50% contrast and 50% heparinized saline infusion, and retrogradely with heparinized saline infusion, was advanced in a coaxial manner and with constant heparinized saline infusion and positioned without difficulty with the stent markers adequate distant from the area of severe stenosis. A control inflation was then performed using a micro inflation syringe device via micro tubing to just over 3 mm x 2 where it was maintained for approximately a minute and a half. Following balloon deflation and retrieval and removal, a control arteriogram performed through the 5 [place] intermediate catheter demonstrated significantly improved caliber of the supraclinoid left ICA with a TICI 3 revascularization of the left MCA being persistent. Again demonstrated was the stenosed left anterior cerebral A1 segment. Over the exchange micro guidewire now in the proximal cavernous segment, a control arteriogram performed through the balloon guide catheter following removal of the intermediate catheter. Measurements were then performed of the left internal carotid artery proximally at the site of the significant stenosis and the distal left common carotid artery. A 6/8 mm x 40 mm Xact stent delivery apparatus was then advanced in a coaxial manner  and with constant heparinized saline infusion and positioned without difficulty with the adequate  coverage distal to the angioplastied segment and into the distal left common carotid artery. The stent was then deployed in the usual manner without any difficulty. The delivery apparatus was removed. A control arteriogram performed through the balloon guide catheter demonstrated significantly improved caliber through the stented segment of the proximal left ICA. The waist at the site of the angioplasty was then treated with a 5 mm x 30 mm Viatrac angioplasty microcatheter which was advanced after its preparation as described above. Prior to the angioplasty, proximal flow arrest was initiated by inflating the balloon in the distal left common carotid artery, and with proximal aspiration during the angioplasty. Following the angioplasty, the balloon was retrieved and removed while aspiration was maintained as flow arrest was reversed. Control arteriograms were then performed through the balloon guide catheter over the next 10-20 minutes continued to demonstrate excellent flow through the centered in the proximal left ICA. Intracranially no change was seen in the TICI 3 revascularization of the left middle cerebral artery distribution. A 5 French diagnostic catheter was then advanced into the right common carotid artery over an 035 inch guidewire. The right common carotid artery injection demonstrated patency of the right internal carotid artery at the neck, and intracranially. The petrous, the cavernous and the supraclinoid right ICA demonstrate wide patency. The right middle cerebral artery and the right anterior cerebral artery demonstrate wide patency into the capillary and venous phases with a 50% stenosis of the right middle cerebral artery M1 segment. Cross-filling via the anterior communicating artery of the left anterior cerebral A2 segment, and the left M1 segment was evident. An 8 French Angio-Seal closure device was deployed at the right groin puncture site for hemostasis. Distal pulses remained present  unchanged in both feet at the end of the procedure. Flat panel CT of the brain demonstrated no evidence of intracranial hemorrhage. Patient was extubated in was beginning to respond to simple commands appropriately slightly weaker on the right side. Medications utilized during the procedure. 81 mg of aspirin and Plavix 15 mg via orogastric tube prior to angioplasty. The patient was also given half bolus dose of cangrelor followed by a 4 hour half dose infusion to be followed by CT of the brain. Patient was then transferred to the neuro ICU for post revascularization care. 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 Underlying a significant atherosclerotic related stenosis treated with a 3 mm x 12 mm Onyx Frontier balloon mounted stent with significantly improved caliber and flow. Status post stent assisted angioplasty of proximal left ICA stenosis with proximal flow arrest as described without event. PLAN: Follow-up in the clinic 4 weeks post discharge. Electronically Signed   By: Julieanne Cotton M.D.   On: 02/02/2023 07:58   IR CT Head Ltd Result Date: 02/02/2023 INDICATION: History of repeated falls. Patient with new onset expressive aphasia and possible right sided weakness. 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 Also noted mid basilar artery occlusion. EXAM: 1. EMERGENT LARGE VESSEL OCCLUSION THROMBOLYSIS (anterior CIRCULATION) COMPARISON:  Recent CT angiogram of the head and neck, and MRI of the brain. MEDICATIONS: Ancef 2 g IV was administered within 1 hour of the procedure. ANESTHESIA/SEDATION: General  anesthesia. CONTRAST:  Omnipaque 300 approximately 110 mL. FLUOROSCOPY TIME:  Fluoroscopy Time: 31 minutes 42 seconds  (1110 mGy). COMPLICATIONS: None immediate. TECHNIQUE: Following a full explanation of the procedure along with the potential associated complications, an informed witnessed consent was obtained. The risks of intracranial hemorrhage of 10%, worsening neurological deficit, ventilator dependency, death and inability to revascularize were all reviewed in detail with the patient. The patient was then put under general anesthesia by the Department of Anesthesiology at [facility]. The right groin was prepped and draped in the usual sterile fashion. Thereafter using modified Seldinger technique, transfemoral access into the right common femoral artery was obtained without difficulty. Over an 0.035 inch guidewire an 8 French 25 cm Pinnacle sheath was inserted. Through this, and also over an 0.035 inch guidewire a combination of a 125 cm 6 [place] Berenstein support catheter inside of an 087 95 cm balloon guide catheter was advanced to the aortic arch region and advanced to the distal left common carotid artery. The guidewire, and the support catheter were removed. Good aspiration was obtained from the hub of the balloon guide catheter. A control arteriogram was then performed centered extra cranially and intracranially. FINDINGS: The left common carotid arteriogram demonstrates the left external carotid artery and its major branches to be widely patent. The left internal carotid artery just distal to the bulb has a focal severe stenosis. 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 PROCEDURE: Through the balloon guide catheter in the distal left ICA, and over an 014 inch 300 cm exchange micro guidewire positioned in the horizontal petrous segment, control angioplasty of the proximal left internal carotid stenosis was performed with a 4 mm x 30 cm Viatrac 14 angioplasty  balloon catheter which had been prepped with 50% contrast and 50% heparinized saline infusion. Using the rapid exchange technique, the balloon was positioned with its markers adequate distant from the site of severe stenosis. A control inflation was then performed using a micro inflation syringe device via micro tubing to approximately 4 mm where it was maintained for approximately a minute and a half. Thereafter, following deflation of the balloon and removal a control arteriogram performed through the balloon guide catheter demonstrated now improved caliber of the angioplastied segment. At this time a 071 Zoom aspiration catheter was advanced in combination with an 021 160 cm Trevo ProVue microcatheter over the exchange micro guidewire to the proximal petrous segment. The 300 cm 014 exchange micro guidewire was then exchanged for an 018 inch micro guidewire with a moderate J configuration distally. The micro guidewire with the microcatheter was then gently advanced without difficulty through the occluded supraclinoid left ICA to the middle cerebral artery inferior division M2 segment followed by the microcatheter. The guidewire was removed. Good aspiration obtained from the hub of the microcatheter. A gentle control arteriogram performed through this demonstrated safe positioning of the tip of the microcatheter. A 4 mm x 40 mm Solitaire X retrieval device was then deployed with the proximal portion in the supraclinoid left ICA. At this time the Zoom aspiration catheter was advanced without difficulty to the mid M1 segment. Control aspiration was then performed for approximately a minute and a half at the hub of the Zoom aspiration catheter, and with a 20 mL syringe at the hub of the balloon guide catheter for approximately a minute  and a half. The combination was then retrieved and removed. Following reversal of flow arrest in the left internal carotid artery a control arteriogram performed demonstrated complete  revascularization of the supraclinoid left ICA with opacification of the left middle cerebral artery achieving a TICI 3 revascularization. Also noted was opacification of the proximal left A1 segment with focal areas of severe stenosis due to arteriosclerosis. Bilateral PCOMs demonstrated opacification into the left posterior cerebral artery P1 segment. Underlying intracranial arteriosclerosis with resulting severe stenosis was noted just proximal to the origins of the posterior communicating arteries. Through the balloon guide catheter in the distal left ICA, an aspiration 5 French 115 cm guide catheter was then advanced to the proximal cavernous segment over an 035 inch guidewire. Through this, 160 cm Trevo ProVue microcatheter was advanced over an 018 inch micro guidewire. The microcatheter and the micro guidewire were advanced to the distal M2 M3 regions of the inferior division followed by the microcatheter. The guidewire was removed. Good aspiration obtained from the hub of the microcatheter. This in turn was exchanged for an 014 inch 300 cm exchange micro guidewire with a moderate J configuration at its distal end. A control arteriogram performed demonstrated safe positioning of the tip of the micro guidewire. Measurements were then performed of the left ICA supraclinoid segment just proximal to the bifurcation and just distal to the origin of the ophthalmic artery. A 3 mm x 12 mm Onyx Frontier balloon mounted stent which had been prepped antegradely with 50% contrast and 50% heparinized saline infusion, and retrogradely with heparinized saline infusion, was advanced in a coaxial manner and with constant heparinized saline infusion and positioned without difficulty with the stent markers adequate distant from the area of severe stenosis. A control inflation was then performed using a micro inflation syringe device via micro tubing to just over 3 mm x 2 where it was maintained for approximately a minute and a  half. Following balloon deflation and retrieval and removal, a control arteriogram performed through the 5 [place] intermediate catheter demonstrated significantly improved caliber of the supraclinoid left ICA with a TICI 3 revascularization of the left MCA being persistent. Again demonstrated was the stenosed left anterior cerebral A1 segment. Over the exchange micro guidewire now in the proximal cavernous segment, a control arteriogram performed through the balloon guide catheter following removal of the intermediate catheter. Measurements were then performed of the left internal carotid artery proximally at the site of the significant stenosis and the distal left common carotid artery. A 6/8 mm x 40 mm Xact stent delivery apparatus was then advanced in a coaxial manner and with constant heparinized saline infusion and positioned without difficulty with the adequate coverage distal to the angioplastied segment and into the distal left common carotid artery. The stent was then deployed in the usual manner without any difficulty. The delivery apparatus was removed. A control arteriogram performed through the balloon guide catheter demonstrated significantly improved caliber through the stented segment of the proximal left ICA. The waist at the site of the angioplasty was then treated with a 5 mm x 30 mm Viatrac angioplasty microcatheter which was advanced after its preparation as described above. Prior to the angioplasty, proximal flow arrest was initiated by inflating the balloon in the distal left common carotid artery, and with proximal aspiration during the angioplasty. Following the angioplasty, the balloon was retrieved and removed while aspiration was maintained as flow arrest was reversed. Control arteriograms were then performed through the balloon guide catheter over the  next 10-20 minutes continued to demonstrate excellent flow through the centered in the proximal left ICA. Intracranially no change was  seen in the TICI 3 revascularization of the left middle cerebral artery distribution. A 5 French diagnostic catheter was then advanced into the right common carotid artery over an 035 inch guidewire. The right common carotid artery injection demonstrated patency of the right internal carotid artery at the neck, and intracranially. The petrous, the cavernous and the supraclinoid right ICA demonstrate wide patency. The right middle cerebral artery and the right anterior cerebral artery demonstrate wide patency into the capillary and venous phases with a 50% stenosis of the right middle cerebral artery M1 segment. Cross-filling via the anterior communicating artery of the left anterior cerebral A2 segment, and the left M1 segment was evident. An 8 French Angio-Seal closure device was deployed at the right groin puncture site for hemostasis. Distal pulses remained present unchanged in both feet at the end of the procedure. Flat panel CT of the brain demonstrated no evidence of intracranial hemorrhage. Patient was extubated in was beginning to respond to simple commands appropriately slightly weaker on the right side. Medications utilized during the procedure. 81 mg of aspirin and Plavix 15 mg via orogastric tube prior to angioplasty. The patient was also given half bolus dose of cangrelor followed by a 4 hour half dose infusion to be followed by CT of the brain. Patient was then transferred to the neuro ICU for post revascularization care. 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 Underlying a significant atherosclerotic related stenosis treated with a 3 mm x 12 mm Onyx Frontier balloon mounted stent with significantly improved caliber and flow. Status post stent assisted angioplasty of proximal left ICA stenosis  with proximal flow arrest as described without event. PLAN: Follow-up in the clinic 4 weeks post discharge. Electronically Signed   By: Julieanne Cotton M.D.   On: 02/02/2023 07:58   IR INTRAVSC STENT CERV CAROTID W/O EMB-PROT MOD SED Result Date: 02/02/2023 INDICATION: History of repeated falls. Patient with new onset expressive aphasia and possible right sided weakness. 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 Also noted mid basilar artery occlusion. EXAM: 1. EMERGENT LARGE VESSEL OCCLUSION THROMBOLYSIS (anterior CIRCULATION) COMPARISON:  Recent CT angiogram of the head and neck, and MRI of the brain. MEDICATIONS: Ancef 2 g IV was administered within 1 hour of the procedure. ANESTHESIA/SEDATION: General anesthesia. CONTRAST:  Omnipaque 300 approximately 110 mL. FLUOROSCOPY TIME:  Fluoroscopy Time: 31 minutes 42 seconds (1110 mGy). COMPLICATIONS: None immediate. TECHNIQUE: Following a full explanation of the procedure along with the potential associated complications, an informed witnessed consent was obtained. The risks of intracranial hemorrhage of 10%, worsening neurological deficit, ventilator dependency, death and inability to revascularize were all reviewed in detail with the patient. The patient was then put under general anesthesia by the Department of Anesthesiology at [facility]. The right groin was prepped and draped in the usual sterile fashion. Thereafter using modified Seldinger technique, transfemoral access into the right common femoral artery was obtained without difficulty. Over an 0.035 inch guidewire an 8 French 25 cm Pinnacle sheath was inserted. Through this, and also over an 0.035 inch guidewire a combination of a 125 cm 6 [place] Berenstein support catheter inside  of an 087 95 cm balloon guide catheter was advanced to the aortic arch region and advanced to the distal left common carotid artery. The  guidewire, and the support catheter were removed. Good aspiration was obtained from the hub of the balloon guide catheter. A control arteriogram was then performed centered extra cranially and intracranially. FINDINGS: The left common carotid arteriogram demonstrates the left external carotid artery and its major branches to be widely patent. The left internal carotid artery just distal to the bulb has a focal severe stenosis. 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 PROCEDURE: Through the balloon guide catheter in the distal left ICA, and over an 014 inch 300 cm exchange micro guidewire positioned in the horizontal petrous segment, control angioplasty of the proximal left internal carotid stenosis was performed with a 4 mm x 30 cm Viatrac 14 angioplasty balloon catheter which had been prepped with 50% contrast and 50% heparinized saline infusion. Using the rapid exchange technique, the balloon was positioned with its markers adequate distant from the site of severe stenosis. A control inflation was then performed using a micro inflation syringe device via micro tubing to approximately 4 mm where it was maintained for approximately a minute and a half. Thereafter, following deflation of the balloon and removal a control arteriogram performed through the balloon guide catheter demonstrated now improved caliber of the angioplastied segment. At this time a 071 Zoom aspiration catheter was advanced in combination with an 021 160 cm Trevo ProVue microcatheter over the exchange micro guidewire to the proximal petrous segment. The 300 cm 014 exchange micro guidewire was then exchanged for an 018 inch micro guidewire with a moderate J configuration distally. The micro guidewire with the microcatheter was then gently advanced without difficulty through the occluded  supraclinoid left ICA to the middle cerebral artery inferior division M2 segment followed by the microcatheter. The guidewire was removed. Good aspiration obtained from the hub of the microcatheter. A gentle control arteriogram performed through this demonstrated safe positioning of the tip of the microcatheter. A 4 mm x 40 mm Solitaire X retrieval device was then deployed with the proximal portion in the supraclinoid left ICA. At this time the Zoom aspiration catheter was advanced without difficulty to the mid M1 segment. Control aspiration was then performed for approximately a minute and a half at the hub of the Zoom aspiration catheter, and with a 20 mL syringe at the hub of the balloon guide catheter for approximately a minute and a half. The combination was then retrieved and removed. Following reversal of flow arrest in the left internal carotid artery a control arteriogram performed demonstrated complete revascularization of the supraclinoid left ICA with opacification of the left middle cerebral artery achieving a TICI 3 revascularization. Also noted was opacification of the proximal left A1 segment with focal areas of severe stenosis due to arteriosclerosis. Bilateral PCOMs demonstrated opacification into the left posterior cerebral artery P1 segment. Underlying intracranial arteriosclerosis with resulting severe stenosis was noted just proximal to the origins of the posterior communicating arteries. Through the balloon guide catheter in the distal left ICA, an aspiration 5 French 115 cm guide catheter was then advanced to the proximal cavernous segment over an 035 inch guidewire. Through this, 160 cm Trevo ProVue microcatheter was advanced over an 018 inch micro guidewire. The microcatheter and the micro guidewire  were advanced to the distal M2 M3 regions of the inferior division followed by the microcatheter. The guidewire was removed. Good aspiration obtained from the hub of the microcatheter. This in  turn was exchanged for an 014 inch 300 cm exchange micro guidewire with a moderate J configuration at its distal end. A control arteriogram performed demonstrated safe positioning of the tip of the micro guidewire. Measurements were then performed of the left ICA supraclinoid segment just proximal to the bifurcation and just distal to the origin of the ophthalmic artery. A 3 mm x 12 mm Onyx Frontier balloon mounted stent which had been prepped antegradely with 50% contrast and 50% heparinized saline infusion, and retrogradely with heparinized saline infusion, was advanced in a coaxial manner and with constant heparinized saline infusion and positioned without difficulty with the stent markers adequate distant from the area of severe stenosis. A control inflation was then performed using a micro inflation syringe device via micro tubing to just over 3 mm x 2 where it was maintained for approximately a minute and a half. Following balloon deflation and retrieval and removal, a control arteriogram performed through the 5 [place] intermediate catheter demonstrated significantly improved caliber of the supraclinoid left ICA with a TICI 3 revascularization of the left MCA being persistent. Again demonstrated was the stenosed left anterior cerebral A1 segment. Over the exchange micro guidewire now in the proximal cavernous segment, a control arteriogram performed through the balloon guide catheter following removal of the intermediate catheter. Measurements were then performed of the left internal carotid artery proximally at the site of the significant stenosis and the distal left common carotid artery. A 6/8 mm x 40 mm Xact stent delivery apparatus was then advanced in a coaxial manner and with constant heparinized saline infusion and positioned without difficulty with the adequate coverage distal to the angioplastied segment and into the distal left common carotid artery. The stent was then deployed in the usual manner  without any difficulty. The delivery apparatus was removed. A control arteriogram performed through the balloon guide catheter demonstrated significantly improved caliber through the stented segment of the proximal left ICA. The waist at the site of the angioplasty was then treated with a 5 mm x 30 mm Viatrac angioplasty microcatheter which was advanced after its preparation as described above. Prior to the angioplasty, proximal flow arrest was initiated by inflating the balloon in the distal left common carotid artery, and with proximal aspiration during the angioplasty. Following the angioplasty, the balloon was retrieved and removed while aspiration was maintained as flow arrest was reversed. Control arteriograms were then performed through the balloon guide catheter over the next 10-20 minutes continued to demonstrate excellent flow through the centered in the proximal left ICA. Intracranially no change was seen in the TICI 3 revascularization of the left middle cerebral artery distribution. A 5 French diagnostic catheter was then advanced into the right common carotid artery over an 035 inch guidewire. The right common carotid artery injection demonstrated patency of the right internal carotid artery at the neck, and intracranially. The petrous, the cavernous and the supraclinoid right ICA demonstrate wide patency. The right middle cerebral artery and the right anterior cerebral artery demonstrate wide patency into the capillary and venous phases with a 50% stenosis of the right middle cerebral artery M1 segment. Cross-filling via the anterior communicating artery of the left anterior cerebral A2 segment, and the left M1 segment was evident. An 8 French Angio-Seal closure device was deployed at the right groin puncture  site for hemostasis. Distal pulses remained present unchanged in both feet at the end of the procedure. Flat panel CT of the brain demonstrated no evidence of intracranial hemorrhage. Patient was  extubated in was beginning to respond to simple commands appropriately slightly weaker on the right side. Medications utilized during the procedure. 81 mg of aspirin and Plavix 15 mg via orogastric tube prior to angioplasty. The patient was also given half bolus dose of cangrelor followed by a 4 hour half dose infusion to be followed by CT of the brain. Patient was then transferred to the neuro ICU for post revascularization care. 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 Underlying a significant atherosclerotic related stenosis treated with a 3 mm x 12 mm Onyx Frontier balloon mounted stent with significantly improved caliber and flow. Status post stent assisted angioplasty of proximal left ICA stenosis with proximal flow arrest as described without event. PLAN: Follow-up in the clinic 4 weeks post discharge. Electronically Signed   By: Julieanne Cotton M.D.   On: 02/02/2023 07:58   IR Intra Cran Stent Result Date: 02/02/2023 INDICATION: History of repeated falls. Patient with new onset expressive aphasia and possible right sided weakness. 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 Also noted mid basilar artery occlusion. EXAM: 1. EMERGENT LARGE VESSEL OCCLUSION THROMBOLYSIS (anterior CIRCULATION) COMPARISON:  Recent CT angiogram of the head and neck, and MRI of the brain. MEDICATIONS: Ancef 2 g IV was administered within 1 hour of the procedure. ANESTHESIA/SEDATION: General anesthesia. CONTRAST:  Omnipaque 300 approximately 110 mL. FLUOROSCOPY TIME:  Fluoroscopy Time: 31 minutes 42 seconds (1110 mGy). COMPLICATIONS: None immediate. TECHNIQUE: Following a full explanation of the procedure along with the potential associated  complications, an informed witnessed consent was obtained. The risks of intracranial hemorrhage of 10%, worsening neurological deficit, ventilator dependency, death and inability to revascularize were all reviewed in detail with the patient. The patient was then put under general anesthesia by the Department of Anesthesiology at [facility]. The right groin was prepped and draped in the usual sterile fashion. Thereafter using modified Seldinger technique, transfemoral access into the right common femoral artery was obtained without difficulty. Over an 0.035 inch guidewire an 8 French 25 cm Pinnacle sheath was inserted. Through this, and also over an 0.035 inch guidewire a combination of a 125 cm 6 [place] Berenstein support catheter inside of an 087 95 cm balloon guide catheter was advanced to the aortic arch region and advanced to the distal left common carotid artery. The guidewire, and the support catheter were removed. Good aspiration was obtained from the hub of the balloon guide catheter. A control arteriogram was then performed centered extra cranially and intracranially. FINDINGS: The left common carotid arteriogram demonstrates the left external carotid artery and its major branches to be widely patent. The left internal carotid artery just distal to the bulb has a focal severe stenosis. 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 PROCEDURE: Through the balloon guide catheter in the distal left ICA, and over an 014 inch 300 cm exchange micro guidewire positioned in the horizontal petrous segment, control  angioplasty of the proximal left internal carotid stenosis was performed with a 4 mm x 30 cm Viatrac 14 angioplasty balloon catheter which had been prepped with 50% contrast and 50% heparinized saline infusion. Using the rapid exchange technique, the  balloon was positioned with its markers adequate distant from the site of severe stenosis. A control inflation was then performed using a micro inflation syringe device via micro tubing to approximately 4 mm where it was maintained for approximately a minute and a half. Thereafter, following deflation of the balloon and removal a control arteriogram performed through the balloon guide catheter demonstrated now improved caliber of the angioplastied segment. At this time a 071 Zoom aspiration catheter was advanced in combination with an 021 160 cm Trevo ProVue microcatheter over the exchange micro guidewire to the proximal petrous segment. The 300 cm 014 exchange micro guidewire was then exchanged for an 018 inch micro guidewire with a moderate J configuration distally. The micro guidewire with the microcatheter was then gently advanced without difficulty through the occluded supraclinoid left ICA to the middle cerebral artery inferior division M2 segment followed by the microcatheter. The guidewire was removed. Good aspiration obtained from the hub of the microcatheter. A gentle control arteriogram performed through this demonstrated safe positioning of the tip of the microcatheter. A 4 mm x 40 mm Solitaire X retrieval device was then deployed with the proximal portion in the supraclinoid left ICA. At this time the Zoom aspiration catheter was advanced without difficulty to the mid M1 segment. Control aspiration was then performed for approximately a minute and a half at the hub of the Zoom aspiration catheter, and with a 20 mL syringe at the hub of the balloon guide catheter for approximately a minute and a half. The combination was then retrieved and removed. Following reversal of flow arrest in the left internal carotid artery a control arteriogram performed demonstrated complete revascularization of the supraclinoid left ICA with opacification of the left middle cerebral artery achieving a TICI 3  revascularization. Also noted was opacification of the proximal left A1 segment with focal areas of severe stenosis due to arteriosclerosis. Bilateral PCOMs demonstrated opacification into the left posterior cerebral artery P1 segment. Underlying intracranial arteriosclerosis with resulting severe stenosis was noted just proximal to the origins of the posterior communicating arteries. Through the balloon guide catheter in the distal left ICA, an aspiration 5 French 115 cm guide catheter was then advanced to the proximal cavernous segment over an 035 inch guidewire. Through this, 160 cm Trevo ProVue microcatheter was advanced over an 018 inch micro guidewire. The microcatheter and the micro guidewire were advanced to the distal M2 M3 regions of the inferior division followed by the microcatheter. The guidewire was removed. Good aspiration obtained from the hub of the microcatheter. This in turn was exchanged for an 014 inch 300 cm exchange micro guidewire with a moderate J configuration at its distal end. A control arteriogram performed demonstrated safe positioning of the tip of the micro guidewire. Measurements were then performed of the left ICA supraclinoid segment just proximal to the bifurcation and just distal to the origin of the ophthalmic artery. A 3 mm x 12 mm Onyx Frontier balloon mounted stent which had been prepped antegradely with 50% contrast and 50% heparinized saline infusion, and retrogradely with heparinized saline infusion, was advanced in a coaxial manner and with constant heparinized saline infusion and positioned without difficulty with the stent markers adequate distant from the area of severe stenosis. A control inflation was  then performed using a micro inflation syringe device via micro tubing to just over 3 mm x 2 where it was maintained for approximately a minute and a half. Following balloon deflation and retrieval and removal, a control arteriogram performed through the 5 [place]  intermediate catheter demonstrated significantly improved caliber of the supraclinoid left ICA with a TICI 3 revascularization of the left MCA being persistent. Again demonstrated was the stenosed left anterior cerebral A1 segment. Over the exchange micro guidewire now in the proximal cavernous segment, a control arteriogram performed through the balloon guide catheter following removal of the intermediate catheter. Measurements were then performed of the left internal carotid artery proximally at the site of the significant stenosis and the distal left common carotid artery. A 6/8 mm x 40 mm Xact stent delivery apparatus was then advanced in a coaxial manner and with constant heparinized saline infusion and positioned without difficulty with the adequate coverage distal to the angioplastied segment and into the distal left common carotid artery. The stent was then deployed in the usual manner without any difficulty. The delivery apparatus was removed. A control arteriogram performed through the balloon guide catheter demonstrated significantly improved caliber through the stented segment of the proximal left ICA. The waist at the site of the angioplasty was then treated with a 5 mm x 30 mm Viatrac angioplasty microcatheter which was advanced after its preparation as described above. Prior to the angioplasty, proximal flow arrest was initiated by inflating the balloon in the distal left common carotid artery, and with proximal aspiration during the angioplasty. Following the angioplasty, the balloon was retrieved and removed while aspiration was maintained as flow arrest was reversed. Control arteriograms were then performed through the balloon guide catheter over the next 10-20 minutes continued to demonstrate excellent flow through the centered in the proximal left ICA. Intracranially no change was seen in the TICI 3 revascularization of the left middle cerebral artery distribution. A 5 French diagnostic catheter  was then advanced into the right common carotid artery over an 035 inch guidewire. The right common carotid artery injection demonstrated patency of the right internal carotid artery at the neck, and intracranially. The petrous, the cavernous and the supraclinoid right ICA demonstrate wide patency. The right middle cerebral artery and the right anterior cerebral artery demonstrate wide patency into the capillary and venous phases with a 50% stenosis of the right middle cerebral artery M1 segment. Cross-filling via the anterior communicating artery of the left anterior cerebral A2 segment, and the left M1 segment was evident. An 8 French Angio-Seal closure device was deployed at the right groin puncture site for hemostasis. Distal pulses remained present unchanged in both feet at the end of the procedure. Flat panel CT of the brain demonstrated no evidence of intracranial hemorrhage. Patient was extubated in was beginning to respond to simple commands appropriately slightly weaker on the right side. Medications utilized during the procedure. 81 mg of aspirin and Plavix 15 mg via orogastric tube prior to angioplasty. The patient was also given half bolus dose of cangrelor followed by a 4 hour half dose infusion to be followed by CT of the brain. Patient was then transferred to the neuro ICU for post revascularization care. 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 Underlying a significant atherosclerotic related stenosis treated with a 3  mm x 12 mm Onyx Frontier balloon mounted stent with significantly improved caliber and flow. Status post stent assisted angioplasty of proximal left ICA stenosis with proximal flow arrest as described without event. PLAN: Follow-up in the clinic 4 weeks post discharge.  Electronically Signed   By: Julieanne Cotton M.D.   On: 02/02/2023 07:58   ECHOCARDIOGRAM COMPLETE Result Date: 02/01/2023    ECHOCARDIOGRAM REPORT   Patient Name:   Grant Berry Date of Exam: 02/01/2023 Medical Rec #:  [id number]          Height:       67.0 in Accession #:    [id number]         Weight:       119.3 lb Date of Birth:  [birth year]          BSA:          1.623 m Patient Age:    77 years           BP:           172/87 mmHg Patient Gender: M                  HR:           92 bpm. Exam Location:  Inpatient Procedure: 2D Echo, Cardiac Doppler and Color Doppler Indications:    Stroke  History:        Patient has prior history of Echocardiogram examinations, most                 recent 09/02/2019. Stroke; Risk Factors:Hypertension and                 Dyslipidemia.  Sonographer:    Milda Smart Referring Phys: [id number] [facility]  Sonographer Comments: Suboptimal parasternal window. Image acquisition challenging due to patient body habitus and Image acquisition challenging due to respiratory motion. IMPRESSIONS  1. Left ventricular ejection fraction, by estimation, is 60 to 65%. The left ventricle has normal function. The left ventricle has no regional wall motion abnormalities. Left ventricular diastolic parameters are consistent with Grade I diastolic dysfunction (impaired relaxation).  2. Right ventricular systolic function is normal. The right ventricular size is normal.  3. The mitral valve is abnormal. No evidence of mitral valve regurgitation. No evidence of mitral stenosis.  4. The aortic valve was not well visualized. There is mild calcification of the aortic valve. There is mild thickening of the aortic valve. Aortic valve regurgitation is not visualized. Aortic valve sclerosis is present, with no evidence of aortic valve  stenosis.  5. The inferior vena cava is normal in size with greater than 50% respiratory variability, suggesting right atrial pressure of 3 mmHg. FINDINGS  Left  Ventricle: Left ventricular ejection fraction, by estimation, is 60 to 65%. The left ventricle has normal function. The left ventricle has no regional wall motion abnormalities. The left ventricular internal cavity size was normal in size. There is  no left ventricular hypertrophy. Left ventricular diastolic parameters are consistent with Grade I diastolic dysfunction (impaired relaxation). Right Ventricle: The right ventricular size is normal. No increase in right ventricular wall thickness. Right ventricular systolic function is normal. Left Atrium: Left atrial size was normal in size. Right Atrium: Right atrial size was normal in size. Pericardium: There is no evidence of pericardial effusion. Mitral Valve: The mitral valve is abnormal. There is mild thickening of the mitral valve leaflet(s). There is mild calcification of the mitral valve leaflet(s). Mild mitral annular calcification.  No evidence of mitral valve regurgitation. No evidence of mitral valve stenosis. MV peak gradient, 5.8 mmHg. The mean mitral valve gradient is 3.0 mmHg. Tricuspid Valve: The tricuspid valve is normal in structure. Tricuspid valve regurgitation is not demonstrated. No evidence of tricuspid stenosis. Aortic Valve: The aortic valve was not well visualized. There is mild calcification of the aortic valve. There is mild thickening of the aortic valve. Aortic valve regurgitation is not visualized. Aortic valve sclerosis is present, with no evidence of aortic valve stenosis. Pulmonic Valve: The pulmonic valve was normal in structure. Pulmonic valve regurgitation is not visualized. No evidence of pulmonic stenosis. Aorta: The aortic root is normal in size and structure. Venous: The inferior vena cava is normal in size with greater than 50% respiratory variability, suggesting right atrial pressure of 3 mmHg. IAS/Shunts: No atrial level shunt detected by color flow Doppler.  LEFT VENTRICLE PLAX 2D LVIDd:         4.40 cm   Diastology LVIDs:          3.20 cm   LV e' medial:    6.20 cm/s LV PW:         0.80 cm   LV E/e' medial:  13.4 LV IVS:        0.60 cm   LV e' lateral:   7.94 cm/s LVOT diam:     2.50 cm   LV E/e' lateral: 10.5 LVOT Area:     4.91 cm  RIGHT VENTRICLE             IVC RV S prime:     17.80 cm/s  IVC diam: 1.40 cm TAPSE (M-mode): 2.5 cm LEFT ATRIUM             Index        RIGHT ATRIUM           Index LA diam:        3.30 cm 2.03 cm/m   RA Area:     11.70 cm LA Vol (A2C):   19.1 ml 11.77 ml/m  RA Volume:   26.80 ml  16.51 ml/m LA Vol (A4C):   27.2 ml 16.76 ml/m LA Biplane Vol: 23.8 ml 14.66 ml/m   AORTA Ao Root diam: 3.60 cm Ao Asc diam:  3.40 cm MITRAL VALVE MV Area (PHT): 1.73 cm     SHUNTS MV Peak grad:  5.8 mmHg     Systemic Diam: 2.50 cm MV Mean grad:  3.0 mmHg MV Vmax:       1.20 m/s MV Vmean:      83.8 cm/s MV Decel Time: 438 msec MV E velocity: 83.30 cm/s MV A velocity: 108.00 cm/s MV E/A ratio:  0.77 Charlton Haws MD Electronically signed by Charlton Haws MD Signature Date/Time: 02/01/2023/3:01:07 PM    Final    MR BRAIN WO CONTRAST Result Date: 02/01/2023 CLINICAL DATA:  Stroke and embolectomy. EXAM: MRI HEAD WITHOUT CONTRAST TECHNIQUE: Multiplanar, multiecho pulse sequences of the brain and surrounding structures were obtained without intravenous contrast. COMPARISON:  Head CT and MRI from yesterday FINDINGS: Brain: Moderate acute infarct in the left occipital cortex, increased from yesterday. Punctate acute infarct in the superior left frontal white matter since prior. There are areas of low-grade restricted diffusion in the left periatrial white matter and right occipital pole attributed to subacute ischemia. Subarachnoid hemorrhage along the anterior left frontal convexity, distinct from any areas of acute infarction and known from prior head CT. Pre-existing bilateral cerebellar and brainstem small infarcts.  Chronic lacunar infarcts in theright caudate. Ischemic gliosis is mild. 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 Generalized brain atrophy. Vascular: No flow seen in the left vertebral artery. There is a basilar flow void. Skull and upper cervical spine: No focal marrow lesion Sinuses/Orbits: No acute finding IMPRESSION: 1. Moderate acute infarct in the left occipital lobe with mild progression from yesterday. Interval punctate acute infarct in the left frontal white matter. 2. Areas of chronic and subacute ischemia preferentially affecting the posterior circulation. 3. Known subarachnoid hemorrhage at the anterior left frontal convexity, stable from preceding head CT. Electronically Signed   By: Tiburcio Pea M.D.   On: 02/01/2023 04:58   CT HEAD WO CONTRAST ( ) Result Date: 01/31/2023 CLINICAL DATA:  Stroke, follow-up; post canrelor infusion EXAM: CT HEAD WITHOUT CONTRAST TECHNIQUE: Contiguous axial images were obtained from the base of the skull through the vertex without intravenous contrast. RADIATION DOSE REDUCTION: This exam was performed according to the departmental dose-optimization program which includes automated exposure control, adjustment of the mA and/or kV according to patient size and/or use of iterative reconstruction technique. COMPARISON:  CT head 10/01/2022 and intra procedural CT 10/01/2022 FINDINGS: Brain: Hyperdense material in the left frontal sulci (series 5, image 47 and 60 and series 3, images 15-18 and 25 area and series 3, image 25), which may represent subarachnoid hemorrhage versus contrast staining. This was not apparent on the immediate postprocedural CT head. Redemonstrated cytotoxic edema in the in the left occipital lobe (series 3, image 14), which is slightly more prominent than on the prior exam. More heterogeneous hypodensity in the right occipital lobe, which is favored to be chronic remote infarcts in the pons and bilateral cerebellar hemispheres. No evidence of acute infarction, hemorrhage, mass, mass effect, or midline shift.  No hydrocephalus or extra-axial fluid collection. Vascular: No hyperdense vessel. Atherosclerotic calcifications in the intracranial carotid and vertebral arteries. Skull: Negative for fracture or focal lesion. Sinuses/Orbits: Mucosal thickening in the ethmoid air cells. No acute finding in the orbits. Status post bilateral lens replacements. Other: The mastoid air cells are well aerated. IMPRESSION: 1. Hyperdense material in the left frontal sulci, which may represent subarachnoid hemorrhage versus contrast staining. This was not apparent on the immediate postprocedural CT head. Recommend short-term follow-up head CT. 2. Redemonstrated cytotoxic edema in the left occipital lobe, which is slightly more prominent than on the prior exam. These results will be called to the ordering clinician or representative by the Radiologist Assistant, and communication documented in the PACS or [employer]. Electronically Signed   By: Wiliam Ke M.D.   On: 01/31/2023 20:52   CT CEREBRAL PERFUSION W CONTRAST Result Date: 01/31/2023 CLINICAL DATA:  Code stroke. 77 year old male. CTA yesterday demonstrating left ICA terminus, distal left vertebral artery, Basilar artery occlusions. EXAM: CT PERFUSION BRAIN TECHNIQUE: Multiphase CT imaging of the brain was performed following IV bolus contrast injection. Subsequent parametric perfusion maps were calculated using RAPID software. RADIATION DOSE REDUCTION: This exam was performed according to the departmental dose-optimization program which includes automated exposure control, adjustment of the mA and/or kV according to patient size and/or use of iterative reconstruction technique. CONTRAST:  40mL OMNIPAQUE IOHEXOL 350 MG/ML SOLN COMPARISON:  Head CTs, CTA head and neck, and brain MRI earlier today. FINDINGS: CT Brain Perfusion Findings: CBF (<30%) Volume: 0mL. No CBF or CBV parameter abnormality is detected. Perfusion (Tmax>6.0s) volume: . Fairly symmetric abnormal T-max  throughout the bilateral cerebellum, posterior cerebral hemispheres. T-max >  4 seconds is more pronounced in the left cerebral hemisphere including the left MCA territory. There is volume of minimal T-max > 10 seconds in the right posterior circulation. Mismatch Volume: Infarct Core: No infarct core is detected Infarction Location:Oligemia throughout the posterior circulation, to a lesser extent the left MCA territory. IMPRESSION: Oligemia throughout the bilateral posterior circulation, including the posterior fossa. Less pronounced oligemia in the Left MCA territory. No infarct core, CBF or CBV parameter abnormality detected by CTP. These results were communicated to Dr. Roda Shutters at 8:12 am on 01/31/2023 by text page via the [facility] messaging system. Electronically Signed   By: Odessa Fleming M.D.   On: 01/31/2023 08:12   CT HEAD CODE STROKE WO CONTRAST Result Date: 01/31/2023 CLINICAL DATA:  Code stroke. 77 year old male. CTA yesterday demonstrating left ICA terminus, distal left vertebral artery, Basilar artery occlusions. EXAM: CT HEAD WITHOUT CONTRAST TECHNIQUE: Contiguous axial images were obtained from the base of the skull through the vertex without intravenous contrast. RADIATION DOSE REDUCTION: This exam was performed according to the departmental dose-optimization program which includes automated exposure control, adjustment of the mA and/or kV according to patient size and/or use of iterative reconstruction technique. COMPARISON:  Brain MRI 0244 hours today. CTA head and neck 0056 hours today. Plain head CT 0050 hours today. FINDINGS: Brain: Left occipital pole cytotoxic edema is slightly more conspicuous on CT now. Heterogeneous right occipital pole redemonstrated. Chronic left central pontine and bilateral cerebellar infarcts on MRI appears stable by CT. No acute intracranial hemorrhage identified. No new cytotoxic edema compared to the earlier MRI. No midline shift, mass effect, or evidence of intracranial  mass lesion. No ventriculomegaly. Vascular: Extensive Calcified atherosclerosis at the skull base. See also CTA earlier today. Skull: Stable and intact. Sinuses/Orbits: Visualized paranasal sinuses and mastoids are clear. Other: No acute orbit or scalp soft tissue finding. ASPECTS [facility] Stroke Program Early CT Score) Total score (0-10 with 10 being normal): 10; occipital pole cytotoxic edema. IMPRESSION: 1. Stable non contrast CT appearance of the brain since 0050 hours today: Occipital pole Cytotoxic edema stable from the MRI at 0242 hours today. No new edema, no intracranial hemorrhage, or mass effect. 2. 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 Electronically Signed   By: Odessa Fleming M.D.   On: 01/31/2023 08:08   MR BRAIN WO CONTRAST Result Date: 01/31/2023 CLINICAL DATA:  Dizziness and gait instability. Large vessel occlusion. EXAM: MRI HEAD WITHOUT CONTRAST TECHNIQUE: Multiplanar, multiecho pulse sequences of the brain and surrounding structures were obtained without intravenous contrast. COMPARISON:  CTA head neck 01/31/2023 Brain MRI 09/01/2019 FINDINGS: Brain: There are areas of acute/early subacute ischemia within both occipital lobes, left-greater-than-right. These areas correspond to the areas of infarction seen on the earlier noncontrast head CT (suggested to be chronic at that time). There is no acute ischemia of the brainstem, cerebellum or anterior circulation. There is multifocal hyperintense T2-weighted signal within the white matter. Generalized volume loss. There is hyperintense T2-weighted signal that corresponds to both sites of infarction. There is a component of chronic infarct within the superior right occipital lobe (series 11, image 25). There are multiple old cerebellar infarcts. The midline structures are normal. Vascular: Abnormal distal basilar artery flow void in keeping with known occlusion. Abnormal flow void at the skull base left ICA covers a longer segment than  the demonstrated occlusion on the earlier CTA, likely due to inhibited upstream flow. Skull and upper cervical spine: Normal calvarium and skull base. Visualized upper cervical  spine and soft tissues are normal. Sinuses/Orbits:No paranasal sinus fluid levels or advanced mucosal thickening. No mastoid or middle ear effusion. Normal orbits. IMPRESSION: 1. Acute/early subacute infarcts within both occipital lobes, left-greater-than-right. 2. No brainstem, cerebellar or anterior circulation acute ischemia. 3. Abnormal skull base flow voids in keeping with known vascular and left ICA occlusions. 4. Old superior right occipital and bilateral cerebellar infarcts. Electronically Signed   By: Deatra Robinson M.D.   On: 01/31/2023 03:24   CT ANGIO HEAD NECK W WO CM (CODE STROKE) Addendum Date: 01/31/2023 ADDENDUM REPORT: 01/31/2023 02:45 ADDENDUM: In addition to the above described ICA occlusion, the distal basilar artery is occluded. There is reconstitution of the basilar tip with patency of both PCAs maintained. The superior cerebellar arteries are occluded at their origin. Please note that this addendum was previously mistakenly associated with the noncontrast head CT for this patient performed on the same day. Electronically Signed   By: Deatra Robinson M.D.   On: 01/31/2023 02:45   Result Date: 01/31/2023 CLINICAL DATA:  Fall with dysarthria and dizziness EXAM: CT ANGIOGRAPHY HEAD AND NECK WITH AND WITHOUT CONTRAST TECHNIQUE: Multidetector CT imaging of the head and neck was performed using the standard protocol during bolus administration of intravenous contrast. Multiplanar CT image reconstructions and MIPs were obtained to evaluate the vascular anatomy. Carotid stenosis measurements (when applicable) are obtained utilizing NASCET criteria, using the distal internal carotid diameter as the denominator. RADIATION DOSE REDUCTION: This exam was performed according to the departmental dose-optimization program which  includes automated exposure control, adjustment of the mA and/or kV according to patient size and/or use of iterative reconstruction technique. CONTRAST:  75mL OMNIPAQUE IOHEXOL 350 MG/ML SOLN COMPARISON:  None Available. FINDINGS: CTA NECK FINDINGS SKELETON: No acute abnormality or high grade bony spinal canal stenosis. OTHER NECK: Normal pharynx, larynx and major salivary glands. No cervical lymphadenopathy. Unremarkable thyroid gland. UPPER CHEST: No pneumothorax or pleural effusion. No nodules or masses. AORTIC ARCH: There is calcific atherosclerosis of the aortic arch. Conventional 3 vessel aortic branching pattern. RIGHT CAROTID SYSTEM: No dissection, occlusion or aneurysm. There is mixed density atherosclerosis extending into the proximal ICA, resulting in less than 50% stenosis. LEFT CAROTID SYSTEM: No dissection, occlusion or aneurysm. There is mixed density atherosclerosis extending into the proximal ICA, resulting in 60% stenosis. VERTEBRAL ARTERIES: Right dominant configuration. Diminutive left vertebral artery with probable short segment occlusion of the V3 segment. Normal right vertebral artery to the skull base. CTA HEAD FINDINGS POSTERIOR CIRCULATION: Right vertebral artery atherosclerosis with moderate stenosis. Normal left V4 segment. No proximal occlusion of the anterior or inferior cerebellar arteries. Basilar artery is normal. Superior cerebellar arteries are normal. Posterior cerebral arteries are normal. ANTERIOR CIRCULATION: The left ICA is occluded at its terminus within occluded length of approximately 9 mm. The right ICA is mildly atherosclerotic without stenosis. Anterior cerebral arteries are normal. Moderate multifocal stenosis of the M1 segment of the right MCA. The left MCA is patent, likely filled by collateral flow along the circle-of-Willis. There is multifocal mild atherosclerotic irregularity without high-grade stenosis. VENOUS SINUSES: As permitted by contrast timing, patent.  ANATOMIC VARIANTS: None Review of the MIP images confirms the above findings. IMPRESSION: 1. 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 The left MCA is patent, likely filled by collateral flow along the circle-of-Willis. 2. Moderate multifocal stenosis of the M1 segment of the right MCA. 3. Moderate right vertebral artery stenosis. 4. Bilateral  carotid bifurcation atherosclerosis with 60% stenosis of the proximal left ICA. 5. Diminutive left vertebral artery with probable short segment occlusion of the V3 segment. Aortic Atherosclerosis (ICD10-I70.0). Critical Value/emergent results were called by telephone at the time of interpretation on 01/31/2023 at 1:17 am to provider [facility] , who verbally acknowledged these results. Electronically Signed: By: Deatra Robinson M.D. 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 There is reconstitution of the basilar tip with patency of both PCAs maintained. The superior cerebellar arteries are occluded at their origin. Electronically Signed   By: Deatra Robinson M.D.   On: 01/31/2023 01:30   Result Date: 01/31/2023 CLINICAL DATA:  Code stroke.  Fall with dizziness EXAM: CT HEAD WITHOUT CONTRAST TECHNIQUE: Contiguous axial images were obtained from the base of the skull through the vertex without intravenous contrast. RADIATION DOSE REDUCTION: This exam was performed according to the departmental dose-optimization program which includes automated exposure control, adjustment of the mA and/or kV according to patient size and/or use of iterative reconstruction technique. COMPARISON:  None Available. FINDINGS: Brain: There is no mass, hemorrhage or extra-axial collection. The size and configuration of the ventricles and extra-axial CSF spaces  are normal. There are multiple old cerebellar small vessel infarcts. Bilateral PCA territory infarctions also appear chronic. There is mild white matter hypoattenuation. Vascular: Atherosclerosis of the carotid and vertebral arteries at the skull base. Skull: Normal Sinuses/Orbits: No fluid levels or advanced mucosal thickening of the visualized paranasal sinuses. No mastoid or middle ear effusion. The orbits are normal. ASPECTS [facility] Stroke Program Early CT Score) - Ganglionic level infarction (caudate, lentiform nuclei, internal capsule, insula, M1-M3 cortex): 7 - Supraganglionic infarction (M4-M6 cortex): 3 Total score (0-10 with 10 being normal): 10 IMPRESSION: 1. No acute intracranial abnormality. 2. ASPECTS is 10. 3. 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 4. Bilateral PCA territory infarctions also appear chronic. These results were called by telephone at the time of interpretation on 01/31/2023 at 1:00 am to provider [facility] , who verbally acknowledged these results. Electronically Signed: By: Deatra Robinson M.D. On: 01/31/2023 01:00   CT C-SPINE NO CHARGE Result Date: 01/31/2023 CLINICAL DATA:  Neck pain, known left ICA occlusion EXAM: CT CERVICAL SPINE WITHOUT CONTRAST TECHNIQUE: Multidetector CT imaging of the cervical spine was performed without intravenous contrast. Multiplanar CT image reconstructions were also generated. RADIATION DOSE REDUCTION: This exam was performed according to the departmental dose-optimization program which includes automated exposure control, adjustment of the mA and/or kV according to patient size and/or use of iterative reconstruction technique. COMPARISON:  None Available. FINDINGS: Alignment: Within normal limits. Skull base and vertebrae: 7 cervical segments are well visualized. Vertebral body height is well maintained. Mild osteophytic change and facet hypertrophic changes noted. No acute fracture or acute facet abnormality is noted. The odontoid is  within normal limits. Soft tissues and spinal canal: Surrounding soft tissue structures appear within normal limits. Upper chest: Visualized lung apices are unremarkable with the exception of some scarring in the right apex. Other: None IMPRESSION: Multilevel degenerative change without acute bony abnormality. Electronically Signed   By: Alcide Clever M.D.   On: 01/31/2023 01:25     PHYSICAL EXAM  Temp:  [97.7 F (36.5 C)-98.9 F (37.2 C)] 97.7 F (36.5 C) (12/18 0825) Pulse Rate:  [78-90] 79 (12/18 0825) Resp:  [16-18] 18 (12/18 0825) BP: (  124-143)/(71-86) 136/77 (12/18 0825) SpO2:  [95 %-100 %] 100 % (12/18 0825)  General -well-nourished, well-developed elderly patient in no acute distress Cardiovascular - Regular rhythm and rate..  Neuro - Awake and alert.  Patient will respond to name and intermittently follow commands.   He has left gaze preference but can look to the right past midline he blinks to threat on the right but not on the left.   He has an apparent left hemianopsia. He will move all 4 extremities with antigravity strength.  ASSESSMENT/PLAN Grant Berry is a 77 y.o. male with history of hypertension, hyperlipidemia, stroke admitted for multiple falls. No tPA given due to outside window.    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  Left ICA proximal 60% stenosis, right multifocal M1 moderate stenosis, right VA stenosis. MRI left MCA/PCA small to moderate infarct.  Old right PCA infarct. Due to developing expressive aphasia, repeat CT showed no acute change CTP no core infarct, increased TTP at posterior circulation and left MCA territory Status post IR with terminal ICA  stenting and proximal ICA angioplasty with TICI3 reperfusion Post IR CT repeat left PCA progressive infarct. Hyperdense material in the left frontal sulci, which may represent subarachnoid hemorrhage versus contrast staining. MRI repeat Moderate acute infarct in the left occipital lobe with mild progression from yesterday. Interval punctate acute infarct in the left frontal white matter. 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 MRI repeat 12/12 showed Interval hemorrhagic transformation with a 2.7 x 1.5 cm hematoma at the left occipital lobe. Additional new acute intraparenchymal hemorrhage at the posterior left basal ganglia measuring 2.2 x 1.9 cm CT repeat done 12/14 and stable.  2D Echo EF 60 to 65% LDL 167 HgbA1c 5.1 UDS negative Lovenox for VTE prophylaxis No antithrombotic prior to admission, now on aspirin 81 mg daily and clopidogrel 75 mg daily for intracranial stenting.  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 Bilateral P-comm and PCA patent Avoid low BP No intervention needed at this time, discussed with Dr. Corliss Skains  History of stroke 08/2019 admitted for dizziness, imbalance and leaning towards to the left.  CT showed left cerebellar infarct which confirmed on MRI.  MRI showed left SCA occlusion, left VA hypoplastic.  2D echo unremarkable.  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  Discharged on DAPT and Lipitor. Follow-up with Dr. Pearlean Brownie at [facility]  Delirium, improving Less restless and agitation now Tele sitter is on at night, will try to discontinue today On seroquel 25 bid -> 25 am and 50 hs  Hx of hypertension Hypotension post procedure Stable now S/p albumin Avoid low  BP Long term BP goal normotensive  Hyperlipidemia Home meds: Zetia LDL 167, goal < 70 Statin intolerance (Lipitor and pravastatin) Continue Zetia at discharge Not candidate for Leqvio   Other Stroke Risk Factors Advanced age Former smoker  Other Active Problems AKI, creatinine 1.40--1.06--1.07--1.21 --1.21--1.09- 1.14  Patient seen and examined by NP/APP with MD. MD  to update note as needed.   Elmer Picker, DNP, FNP-BC [facility] Pager: [phone]  I have personally obtained history,examined this patient, reviewed notes, independently viewed imaging studies, participated in medical decision making and plan of care.ROS completed by me personally and pertinent positives fully documented  I have made any additions or clarifications directly to the above note. Agree with note above.  Patient medically stable to be transferred to skilled nursing facility for rehab when bed available.  Delia Heady, MD Medical Director [facility] Pager: [phone] 02/10/2023 2:50 PM

## 2023-02-10 NOTE — Discharge Summary (Incomplete)
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   Pravastatin Other (See Comments)   Pain        Medication List     STOP taking these medications    amLODipine 10 MG tablet Commonly known as: NORVASC       TAKE these medications    aspirin EC 81 MG tablet Take 1 tablet (81 mg total) by mouth daily. Swallow whole. Start taking on: February 12, 2023   B-complex with vitamin C tablet Take 1 tablet by mouth daily.   clopidogrel 75 MG tablet Commonly known as: PLAVIX Take 1 tablet (75 mg total) by mouth daily. Start taking on: February 12, 2023   ezetimibe 10 MG tablet Commonly known as: ZETIA Take 10 mg by mouth daily.   FISH OIL PO Take 1 capsule by mouth daily.   MAGNESIUM PO Take 1 tablet by mouth in the morning and at bedtime.   QUEtiapine 50 MG tablet Commonly known as: SEROQUEL Take 1 tablet (50 mg total) by mouth at bedtime.   QUEtiapine 25 MG tablet Commonly known as: SEROQUEL Take 1 tablet (25 mg total) by mouth daily after  breakfast. Start taking on: February 12, 2023        LABORATORY STUDIES CBC    Component Value Date/Time   WBC 8.4 02/09/2023 0538   RBC 3.35 (L) 02/09/2023 0538   HGB 9.9 (L) 02/09/2023 0538   HGB 13.9 07/10/2022 1012   HCT 30.1 (L) 02/09/2023 0538   HCT 42.3 07/10/2022 1012   PLT 319 02/09/2023 0538   PLT 220 07/10/2022 1012   MCV 89.9 02/09/2023 0538   MCV 90 07/10/2022 1012   MCH 29.6 02/09/2023 0538   MCHC 32.9 02/09/2023 0538   RDW 13.7 02/09/2023 0538   RDW 14.1 07/10/2022 1012   LYMPHSABS 1.1 02/01/2023 0444   LYMPHSABS 2.0 07/10/2022 1012   MONOABS 0.6 02/01/2023 0444   EOSABS 0.1 02/01/2023 0444   EOSABS 0.3 07/10/2022 1012   BASOSABS 0.1 02/01/2023 0444   BASOSABS 0.1 07/10/2022 1012   CMP    Component Value Date/Time   NA 134 (L) 02/09/2023 0538   NA 140 07/10/2022 1012   K 4.1 02/09/2023 0538   CL 101 02/09/2023 0538   CO2 26 02/09/2023 0538   GLUCOSE 106 (H) 02/09/2023 0538   BUN 18 02/09/2023 0538   BUN 14 07/10/2022 1012   CREATININE 1.14 02/09/2023 0538   CREATININE 1.22 06/17/2012 1620   CALCIUM 8.7 (L) 02/09/2023 0538   PROT 7.2  01/31/2023 0040   PROT 6.8 07/10/2022 1012   ALBUMIN 4.0 01/31/2023 0040   ALBUMIN 4.3 07/10/2022 1012   AST 27 01/31/2023 0040   ALT 15 01/31/2023 0040   ALKPHOS 55 01/31/2023 0040   BILITOT 0.5 01/31/2023 0040   BILITOT 0.4 07/10/2022 1012   GFRNONAA >60 02/09/2023 0538   GFRNONAA 62 06/17/2012 1620   GFRAA 72 01/22/2020 1105   GFRAA 71 06/17/2012 1620   COAGS Lab Results  Component Value Date   INR 1.0 01/31/2023   Lipid Panel    Component Value Date/Time   CHOL 235 (H) 01/31/2023 0454   CHOL 202 (H) 07/10/2022 1012   CHOL 219 (H) 06/17/2012 1620   TRIG 47 01/31/2023 0454   TRIG 215 (H) 06/17/2012 1620   HDL 59 01/31/2023 0454   HDL 48 07/10/2022 1012   HDL 34 (L) 06/17/2012 1620   CHOLHDL 4.0 01/31/2023 0454   VLDL 9 01/31/2023 0454   LDLCALC 167 (H) 01/31/2023 0454   LDLCALC 134 (H)  07/10/2022 1012   LDLCALC 142 (H) 06/17/2012 1620   HgbA1C  Lab Results  Component Value Date   HGBA1C 5.1 01/31/2023   Alcohol Level    Component Value Date/Time   ETH <10 01/31/2023 0040     SIGNIFICANT DIAGNOSTIC STUDIES CT HEAD WO CONTRAST ( ) Result Date: 02/06/2023 CLINICAL DATA:  77 year old male code stroke presentation on 01/31/2023 with multiple intracranial occlusions. Status post endovascular reperfusion, stenting. Left lentiform hemorrhagic transformation in the left basal ganglia and occipital lobe. EXAM: CT HEAD WITHOUT CONTRAST TECHNIQUE: Contiguous axial images were obtained from the base of the skull through the vertex without intravenous contrast. RADIATION DOSE REDUCTION: This exam was performed according to the departmental dose-optimization program which includes automated exposure control, adjustment of the mA and/or kV according to patient size and/or use of iterative reconstruction technique. COMPARISON:  Brain MRI 02/04/2023. FINDINGS: Brain: Oval left lentiform hemorrhage is 25 x 17 by 32 mm (AP by transverse by CC) for estimated volume of 7 mL and appears mildly larger since the MRI on 02/04/2023. Regional hypodense edema. Oval left occipital pole cortical and subcortical hyperdense hemorrhage superimposed on confluent cytotoxic edema there is 19 x 42 x 14 mm (AP by transverse by CC) for an estimated blood volume of 6 mL, stable since 02/04/2023. No intraventricular blood. Trace additional left hemisphere subarachnoid hemorrhage suspected such as on sagittal image 43 left superior frontal gyrus. No midline shift or significant intracranial mass effect. Mild mass effect on the left lateral ventricle is stable. No ventriculomegaly. Underlying scattered chronic cerebral encephalomalacia including in the posterior fossa. No new cortically based infarct identified. Vascular: Distal left ICA stent. Calcified atherosclerosis at the skull base. Skull: Stable, intact.  Sinuses/Orbits: Visualized paranasal sinuses and mastoids are stable and well aerated. Other: No acute orbit or scalp soft tissue finding. IMPRESSION: 1. Left lentiform hemorrhage estimated at 7 mL appears slightly larger since 02/04/2023. Hemorrhagic transformation of the left PCA infarct estimated at 6 mL is stable. Trace left hemisphere subarachnoid hemorrhage suspected. 2. No midline shift or significant intracranial mass effect. No new intracranial abnormality. Multifocal chronic ischemic disease. Electronically Signed   By: Odessa Fleming M.D.   On: 02/06/2023 09:44   MR BRAIN WO CONTRAST Addendum Date: 02/04/2023 ADDENDUM REPORT: 02/04/2023 21:09 ADDENDUM: Results were communicated by telephone at the time of interpretation on 02/04/2023 at 9 p.m. to provider Dr. Otelia Limes, who verbally acknowledged these results. Electronically Signed   By: Janell Quiet.D.  On: 02/04/2023 21:09   Result Date: 02/04/2023 CLINICAL DATA:  Follow-up examination for stroke. EXAM: MRI HEAD WITHOUT CONTRAST TECHNIQUE: Multiplanar, multiecho pulse sequences of the brain and surrounding structures were obtained without intravenous contrast. COMPARISON:  Previous MRI from 02/01/2023 and CT from 01/31/2023 FINDINGS: Brain: Atrophy with chronic microvascular ischemic disease. Evolving late subacute right PCA distribution infarct involving the right occipital lobe. Multifocal chronic infarcts involving the left greater than right cerebellum. Few scattered remote lacunar infarcts about the basal ganglia and pons. Continued interval evolution of subacute left PCA distribution infarct, similar in size as compared to previous. Few additional patchy foci of all vein subacute ischemia noted superiorly within the subcortical posterior left frontal parietal region, also similar. There has been evidence for hemorrhagic transformation with a hematoma measuring 2.7 x 1.5 cm now seen at the left occipital lobe (series 14, image 22).  Additional new acute intraparenchymal hemorrhage at the posterior left basal ganglia measures approximately 2.2 x 1.9 cm (series 14, image 32). No significant regional mass effect. No intraventricular extension. Additional small volume subarachnoid hemorrhage at the anterior left frontal convexity, slightly less conspicuous as compared to prior. Additional scattered chronic micro hemorrhages noted, stable. No other new or interval infarction elsewhere. 3.7 cm benign arachnoid cyst at the left frontal vertex again noted. No other mass lesion or midline shift. No hydrocephalus. No extra-axial fluid collection. Vascular: Loss of normal flow void within the left vertebral artery, stable. Heterogeneous but preserved flow voids within the remaining posterior circulation. Normal flow voids seen within the anterior circulation. Skull and upper cervical spine: Craniocervical junction within normal limits. Bone marrow signal intensity normal. No scalp soft tissue abnormality. Sinuses/Orbits: Prior bilateral ocular lens replacement. Paranasal sinuses remain largely clear. No significant mastoid effusion. Other: 9 mm T2 hyperintense lesion within the right parotid gland (series 15, image 21), indeterminate. IMPRESSION: 1. Continued interval evolution of subacute left PCA distribution infarct, similar in size as compared to previous. Interval hemorrhagic transformation with a 2.7 x 1.5 cm hematoma at the left occipital lobe. Additional new acute intraparenchymal hemorrhage at the posterior left basal ganglia measuring 2.2 x 1.9 cm. No significant regional mass effect. No intraventricular extension. 2. Additional small volume subarachnoid hemorrhage at the anterior left frontal convexity, slightly less conspicuous as compared to prior. 3. Evolving late subacute right PCA distribution infarct, stable. 4. Underlying atrophy with chronic microvascular ischemic disease, with multiple chronic infarcts as above. 5. 9 mm T2 hyperintense  lesion within the right parotid gland, indeterminate. Correlation with nonemergent outpatient ultrasound suggested for further evaluation. The covering neurohospitalist has been paged regarding these findings, currently awaiting a call back. Results will be conveyed as soon as possible. Electronically Signed: By: Rise Mu M.D. On: 02/04/2023 20:59   IR PERCUTANEOUS ART THROMBECTOMY/INFUSION INTRACRANIAL INC DIAG ANGIO Result Date: 02/02/2023 INDICATION: History of repeated falls. Patient with new onset expressive aphasia and possible right sided weakness. 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 Also noted mid basilar artery occlusion. EXAM: 1. EMERGENT LARGE VESSEL OCCLUSION THROMBOLYSIS (anterior CIRCULATION) COMPARISON:  Recent CT angiogram of the head and neck, and MRI of the brain. MEDICATIONS: Ancef 2 g IV was administered within 1 hour of the procedure. ANESTHESIA/SEDATION: General anesthesia. CONTRAST:  Omnipaque 300 approximately 110 mL. FLUOROSCOPY TIME:  Fluoroscopy Time: 31 minutes 42 seconds (1110 mGy). COMPLICATIONS: None immediate. TECHNIQUE: Following a full explanation of the procedure along with the potential associated complications, an  informed witnessed consent was obtained. The risks of intracranial hemorrhage of 10%, worsening neurological deficit, ventilator dependency, death and inability to revascularize were all reviewed in detail with the patient. The patient was then put under general anesthesia by the Department of Anesthesiology at [facility]. The right groin was prepped and draped in the usual sterile fashion. Thereafter using modified Seldinger technique, transfemoral access into the right common femoral artery was obtained without difficulty. Over an 0.035 inch guidewire an 8 French 25 cm Pinnacle sheath was inserted. Through this, and also over an 0.035 inch guidewire a  combination of a 125 cm 6 [place] Berenstein support catheter inside of an 087 95 cm balloon guide catheter was advanced to the aortic arch region and advanced to the distal left common carotid artery. The guidewire, and the support catheter were removed. Good aspiration was obtained from the hub of the balloon guide catheter. A control arteriogram was then performed centered extra cranially and intracranially. FINDINGS: The left common carotid arteriogram demonstrates the left external carotid artery and its major branches to be widely patent. The left internal carotid artery just distal to the bulb has a focal severe stenosis. 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 PROCEDURE: Through the balloon guide catheter in the distal left ICA, and over an 014 inch 300 cm exchange micro guidewire positioned in the horizontal petrous segment, control angioplasty of the proximal left internal carotid stenosis was performed with a 4 mm x 30 cm Viatrac 14 angioplasty balloon catheter which had been prepped with 50% contrast and 50% heparinized saline infusion. Using the rapid exchange technique, the balloon was positioned with its markers adequate distant from the site of severe stenosis. A control inflation was then performed using a micro inflation syringe device via micro tubing to approximately 4 mm where it was maintained for approximately a minute and a half. Thereafter, following deflation of the balloon and removal a control arteriogram performed through the balloon guide catheter demonstrated now improved caliber of the angioplastied segment. At this time a 071 Zoom aspiration catheter was advanced in combination with an 021 160 cm Trevo ProVue microcatheter over the exchange micro guidewire to the proximal petrous segment. The 300 cm 014 exchange micro guidewire was  then exchanged for an 018 inch micro guidewire with a moderate J configuration distally. The micro guidewire with the microcatheter was then gently advanced without difficulty through the occluded supraclinoid left ICA to the middle cerebral artery inferior division M2 segment followed by the microcatheter. The guidewire was removed. Good aspiration obtained from the hub of the microcatheter. A gentle control arteriogram performed through this demonstrated safe positioning of the tip of the microcatheter. A 4 mm x 40 mm Solitaire X retrieval device was then deployed with the proximal portion in the supraclinoid left ICA. At this time the Zoom aspiration catheter was advanced without difficulty to the mid M1 segment. Control aspiration was then performed for approximately a minute and a half at the hub of the Zoom aspiration catheter, and with a 20 mL syringe at the hub of the balloon guide catheter for approximately a minute and a half. The combination was then retrieved and removed. Following reversal of flow arrest in the left internal carotid artery a control arteriogram performed demonstrated complete revascularization of the supraclinoid left ICA with opacification of the  left middle cerebral artery achieving a TICI 3 revascularization. Also noted was opacification of the proximal left A1 segment with focal areas of severe stenosis due to arteriosclerosis. Bilateral PCOMs demonstrated opacification into the left posterior cerebral artery P1 segment. Underlying intracranial arteriosclerosis with resulting severe stenosis was noted just proximal to the origins of the posterior communicating arteries. Through the balloon guide catheter in the distal left ICA, an aspiration 5 French 115 cm guide catheter was then advanced to the proximal cavernous segment over an 035 inch guidewire. Through this, 160 cm Trevo ProVue microcatheter was advanced over an 018 inch micro guidewire. The microcatheter and the micro  guidewire were advanced to the distal M2 M3 regions of the inferior division followed by the microcatheter. The guidewire was removed. Good aspiration obtained from the hub of the microcatheter. This in turn was exchanged for an 014 inch 300 cm exchange micro guidewire with a moderate J configuration at its distal end. A control arteriogram performed demonstrated safe positioning of the tip of the micro guidewire. Measurements were then performed of the left ICA supraclinoid segment just proximal to the bifurcation and just distal to the origin of the ophthalmic artery. A 3 mm x 12 mm Onyx Frontier balloon mounted stent which had been prepped antegradely with 50% contrast and 50% heparinized saline infusion, and retrogradely with heparinized saline infusion, was advanced in a coaxial manner and with constant heparinized saline infusion and positioned without difficulty with the stent markers adequate distant from the area of severe stenosis. A control inflation was then performed using a micro inflation syringe device via micro tubing to just over 3 mm x 2 where it was maintained for approximately a minute and a half. Following balloon deflation and retrieval and removal, a control arteriogram performed through the 5 [place] intermediate catheter demonstrated significantly improved caliber of the supraclinoid left ICA with a TICI 3 revascularization of the left MCA being persistent. Again demonstrated was the stenosed left anterior cerebral A1 segment. Over the exchange micro guidewire now in the proximal cavernous segment, a control arteriogram performed through the balloon guide catheter following removal of the intermediate catheter. Measurements were then performed of the left internal carotid artery proximally at the site of the significant stenosis and the distal left common carotid artery. A 6/8 mm x 40 mm Xact stent delivery apparatus was then advanced in a coaxial manner and with constant heparinized saline  infusion and positioned without difficulty with the adequate coverage distal to the angioplastied segment and into the distal left common carotid artery. The stent was then deployed in the usual manner without any difficulty. The delivery apparatus was removed. A control arteriogram performed through the balloon guide catheter demonstrated significantly improved caliber through the stented segment of the proximal left ICA. The waist at the site of the angioplasty was then treated with a 5 mm x 30 mm Viatrac angioplasty microcatheter which was advanced after its preparation as described above. Prior to the angioplasty, proximal flow arrest was initiated by inflating the balloon in the distal left common carotid artery, and with proximal aspiration during the angioplasty. Following the angioplasty, the balloon was retrieved and removed while aspiration was maintained as flow arrest was reversed. Control arteriograms were then performed through the balloon guide catheter over the next 10-20 minutes continued to demonstrate excellent flow through the centered in the proximal left ICA. Intracranially no change was seen in the TICI 3 revascularization of the left middle cerebral artery distribution. A 5 [place] diagnostic  catheter was then advanced into the right common carotid artery over an 035 inch guidewire. The right common carotid artery injection demonstrated patency of the right internal carotid artery at the neck, and intracranially. The petrous, the cavernous and the supraclinoid right ICA demonstrate wide patency. The right middle cerebral artery and the right anterior cerebral artery demonstrate wide patency into the capillary and venous phases with a 50% stenosis of the right middle cerebral artery M1 segment. Cross-filling via the anterior communicating artery of the left anterior cerebral A2 segment, and the left M1 segment was evident. An 8 French Angio-Seal closure device was deployed at the right groin  puncture site for hemostasis. Distal pulses remained present unchanged in both feet at the end of the procedure. Flat panel CT of the brain demonstrated no evidence of intracranial hemorrhage. Patient was extubated in was beginning to respond to simple commands appropriately slightly weaker on the right side. Medications utilized during the procedure. 81 mg of aspirin and Plavix 15 mg via orogastric tube prior to angioplasty. The patient was also given half bolus dose of cangrelor followed by a 4 hour half dose infusion to be followed by CT of the brain. Patient was then transferred to the neuro ICU for post revascularization care. 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 Underlying a significant atherosclerotic related stenosis treated with a 3 mm x 12 mm Onyx Frontier balloon mounted stent with significantly improved caliber and flow. Status post stent assisted angioplasty of proximal left ICA stenosis with proximal flow arrest as described without event. PLAN: Follow-up in the clinic 4 weeks post discharge. Electronically Signed   By: Julieanne Cotton M.D.   On: 02/02/2023 07:58   IR CT Head Ltd Result Date: 02/02/2023 INDICATION: History of repeated falls. Patient with new onset expressive aphasia and possible right sided weakness. 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 Also noted mid basilar artery occlusion. EXAM: 1. EMERGENT LARGE VESSEL OCCLUSION THROMBOLYSIS (anterior CIRCULATION) COMPARISON:  Recent CT angiogram of the head and neck, and MRI of the brain. MEDICATIONS: Ancef 2 g IV was administered within 1 hour of the procedure. ANESTHESIA/SEDATION: General anesthesia. CONTRAST:  Omnipaque 300 approximately 110  mL. FLUOROSCOPY TIME:  Fluoroscopy Time: 31 minutes 42 seconds (1110 mGy). COMPLICATIONS: None immediate. TECHNIQUE: Following a full explanation of the procedure along with the potential associated complications, an informed witnessed consent was obtained. The risks of intracranial hemorrhage of 10%, worsening neurological deficit, ventilator dependency, death and inability to revascularize were all reviewed in detail with the patient. The patient was then put under general anesthesia by the Department of Anesthesiology at [facility]. The right groin was prepped and draped in the usual sterile fashion. Thereafter using modified Seldinger technique, transfemoral access into the right common femoral artery was obtained without difficulty. Over an 0.035 inch guidewire an 8 French 25 cm Pinnacle sheath was inserted. Through this, and also over an 0.035 inch guidewire a combination of a 125 cm 6 [place] Berenstein support catheter inside of an 087 95 cm balloon guide catheter was advanced to the aortic arch region and advanced to the distal left common carotid artery. The guidewire, and the support catheter were removed. Good aspiration was obtained from the hub of the balloon  guide catheter. A control arteriogram was then performed centered extra cranially and intracranially. FINDINGS: The left common carotid arteriogram demonstrates the left external carotid artery and its major branches to be widely patent. The left internal carotid artery just distal to the bulb has a focal severe stenosis. 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 PROCEDURE: Through the balloon guide catheter in the distal left ICA, and over an 014 inch 300 cm exchange micro guidewire positioned in the horizontal petrous segment, control angioplasty of the proximal left internal carotid  stenosis was performed with a 4 mm x 30 cm Viatrac 14 angioplasty balloon catheter which had been prepped with 50% contrast and 50% heparinized saline infusion. Using the rapid exchange technique, the balloon was positioned with its markers adequate distant from the site of severe stenosis. A control inflation was then performed using a micro inflation syringe device via micro tubing to approximately 4 mm where it was maintained for approximately a minute and a half. Thereafter, following deflation of the balloon and removal a control arteriogram performed through the balloon guide catheter demonstrated now improved caliber of the angioplastied segment. At this time a 071 Zoom aspiration catheter was advanced in combination with an 021 160 cm Trevo ProVue microcatheter over the exchange micro guidewire to the proximal petrous segment. The 300 cm 014 exchange micro guidewire was then exchanged for an 018 inch micro guidewire with a moderate J configuration distally. The micro guidewire with the microcatheter was then gently advanced without difficulty through the occluded supraclinoid left ICA to the middle cerebral artery inferior division M2 segment followed by the microcatheter. The guidewire was removed. Good aspiration obtained from the hub of the microcatheter. A gentle control arteriogram performed through this demonstrated safe positioning of the tip of the microcatheter. A 4 mm x 40 mm Solitaire X retrieval device was then deployed with the proximal portion in the supraclinoid left ICA. At this time the Zoom aspiration catheter was advanced without difficulty to the mid M1 segment. Control aspiration was then performed for approximately a minute and a half at the hub of the Zoom aspiration catheter, and with a 20 mL syringe at the hub of the balloon guide catheter for approximately a minute and a half. The combination was then retrieved and removed. Following reversal of flow arrest in the left internal  carotid artery a control arteriogram performed demonstrated complete revascularization of the supraclinoid left ICA with opacification of the left middle cerebral artery achieving a TICI 3 revascularization. Also noted was opacification of the proximal left A1 segment with focal areas of severe stenosis due to arteriosclerosis. Bilateral PCOMs demonstrated opacification into the left posterior cerebral artery P1 segment. Underlying intracranial arteriosclerosis with resulting severe stenosis was noted just proximal to the origins of the posterior communicating arteries. Through the balloon guide catheter in the distal left ICA, an aspiration 5 French 115 cm guide catheter was then advanced to the proximal cavernous segment over an 035 inch guidewire. Through this, 160 cm Trevo ProVue microcatheter was advanced over an 018 inch micro guidewire. The microcatheter and the micro guidewire were advanced to the distal M2 M3 regions of the inferior division followed by the microcatheter. The guidewire was removed. Good aspiration obtained from the hub of the microcatheter. This in turn was exchanged for an 014 inch 300 cm exchange micro  guidewire with a moderate J configuration at its distal end. A control arteriogram performed demonstrated safe positioning of the tip of the micro guidewire. Measurements were then performed of the left ICA supraclinoid segment just proximal to the bifurcation and just distal to the origin of the ophthalmic artery. A 3 mm x 12 mm Onyx Frontier balloon mounted stent which had been prepped antegradely with 50% contrast and 50% heparinized saline infusion, and retrogradely with heparinized saline infusion, was advanced in a coaxial manner and with constant heparinized saline infusion and positioned without difficulty with the stent markers adequate distant from the area of severe stenosis. A control inflation was then performed using a micro inflation syringe device via micro tubing to just  over 3 mm x 2 where it was maintained for approximately a minute and a half. Following balloon deflation and retrieval and removal, a control arteriogram performed through the 5 [place] intermediate catheter demonstrated significantly improved caliber of the supraclinoid left ICA with a TICI 3 revascularization of the left MCA being persistent. Again demonstrated was the stenosed left anterior cerebral A1 segment. Over the exchange micro guidewire now in the proximal cavernous segment, a control arteriogram performed through the balloon guide catheter following removal of the intermediate catheter. Measurements were then performed of the left internal carotid artery proximally at the site of the significant stenosis and the distal left common carotid artery. A 6/8 mm x 40 mm Xact stent delivery apparatus was then advanced in a coaxial manner and with constant heparinized saline infusion and positioned without difficulty with the adequate coverage distal to the angioplastied segment and into the distal left common carotid artery. The stent was then deployed in the usual manner without any difficulty. The delivery apparatus was removed. A control arteriogram performed through the balloon guide catheter demonstrated significantly improved caliber through the stented segment of the proximal left ICA. The waist at the site of the angioplasty was then treated with a 5 mm x 30 mm Viatrac angioplasty microcatheter which was advanced after its preparation as described above. Prior to the angioplasty, proximal flow arrest was initiated by inflating the balloon in the distal left common carotid artery, and with proximal aspiration during the angioplasty. Following the angioplasty, the balloon was retrieved and removed while aspiration was maintained as flow arrest was reversed. Control arteriograms were then performed through the balloon guide catheter over the next 10-20 minutes continued to demonstrate excellent flow through  the centered in the proximal left ICA. Intracranially no change was seen in the TICI 3 revascularization of the left middle cerebral artery distribution. A 5 French diagnostic catheter was then advanced into the right common carotid artery over an 035 inch guidewire. The right common carotid artery injection demonstrated patency of the right internal carotid artery at the neck, and intracranially. The petrous, the cavernous and the supraclinoid right ICA demonstrate wide patency. The right middle cerebral artery and the right anterior cerebral artery demonstrate wide patency into the capillary and venous phases with a 50% stenosis of the right middle cerebral artery M1 segment. Cross-filling via the anterior communicating artery of the left anterior cerebral A2 segment, and the left M1 segment was evident. An 8 French Angio-Seal closure device was deployed at the right groin puncture site for hemostasis. Distal pulses remained present unchanged in both feet at the end of the procedure. Flat panel CT of the brain demonstrated no evidence of intracranial hemorrhage. Patient was extubated in was beginning to respond to simple commands appropriately slightly  weaker on the right side. Medications utilized during the procedure. 81 mg of aspirin and Plavix 15 mg via orogastric tube prior to angioplasty. The patient was also given half bolus dose of cangrelor followed by a 4 hour half dose infusion to be followed by CT of the brain. Patient was then transferred to the neuro ICU for post revascularization care. 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 Underlying a significant atherosclerotic related stenosis treated with a 3 mm x 12 mm Onyx Frontier balloon mounted stent with significantly improved caliber and flow.  Status post stent assisted angioplasty of proximal left ICA stenosis with proximal flow arrest as described without event. PLAN: Follow-up in the clinic 4 weeks post discharge. Electronically Signed   By: Julieanne Cotton M.D.   On: 02/02/2023 07:58   IR INTRAVSC STENT CERV CAROTID W/O EMB-PROT MOD SED Result Date: 02/02/2023 INDICATION: History of repeated falls. Patient with new onset expressive aphasia and possible right sided weakness. 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 Also noted mid basilar artery occlusion. EXAM: 1. EMERGENT LARGE VESSEL OCCLUSION THROMBOLYSIS (anterior CIRCULATION) COMPARISON:  Recent CT angiogram of the head and neck, and MRI of the brain. MEDICATIONS: Ancef 2 g IV was administered within 1 hour of the procedure. ANESTHESIA/SEDATION: General anesthesia. CONTRAST:  Omnipaque 300 approximately 110 mL. FLUOROSCOPY TIME:  Fluoroscopy Time: 31 minutes 42 seconds (1110 mGy). COMPLICATIONS: None immediate. TECHNIQUE: Following a full explanation of the procedure along with the potential associated complications, an informed witnessed consent was obtained. The risks of intracranial hemorrhage of 10%, worsening neurological deficit, ventilator dependency, death and inability to revascularize were all reviewed in detail with the patient. The patient was then put under general anesthesia by the Department of Anesthesiology at [facility]. The right groin was prepped and draped in the usual sterile fashion. Thereafter using modified Seldinger technique, transfemoral access into the right common femoral artery was obtained without difficulty. Over an 0.035 inch guidewire an 8 French 25 cm Pinnacle sheath was inserted. Through this, and also over an 0.035 inch guidewire a combination of a 125 cm 6 [place] Berenstein support catheter inside of an 087 95 cm balloon guide catheter was advanced to the aortic  arch region and advanced to the distal left common carotid artery. The guidewire, and the support catheter were removed. Good aspiration was obtained from the hub of the balloon guide catheter. A control arteriogram was then performed centered extra cranially and intracranially. FINDINGS: The left common carotid arteriogram demonstrates the left external carotid artery and its major branches to be widely patent. The left internal carotid artery just distal to the bulb has a focal severe stenosis. 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 PROCEDURE: Through the balloon guide catheter in the distal left ICA, and over an 014 inch 300 cm exchange micro guidewire positioned in the horizontal petrous segment, control angioplasty of the proximal left internal carotid stenosis was performed with a 4 mm x 30 cm Viatrac 14 angioplasty balloon catheter which had been prepped with 50% contrast and 50% heparinized saline infusion. Using the rapid  exchange technique, the balloon was positioned with its markers adequate distant from the site of severe stenosis. A control inflation was then performed using a micro inflation syringe device via micro tubing to approximately 4 mm where it was maintained for approximately a minute and a half. Thereafter, following deflation of the balloon and removal a control arteriogram performed through the balloon guide catheter demonstrated now improved caliber of the angioplastied segment. At this time a 071 Zoom aspiration catheter was advanced in combination with an 021 160 cm Trevo ProVue microcatheter over the exchange micro guidewire to the proximal petrous segment. The 300 cm 014 exchange micro guidewire was then exchanged for an 018 inch micro guidewire with a moderate J configuration distally. The micro guidewire with the microcatheter was  then gently advanced without difficulty through the occluded supraclinoid left ICA to the middle cerebral artery inferior division M2 segment followed by the microcatheter. The guidewire was removed. Good aspiration obtained from the hub of the microcatheter. A gentle control arteriogram performed through this demonstrated safe positioning of the tip of the microcatheter. A 4 mm x 40 mm Solitaire X retrieval device was then deployed with the proximal portion in the supraclinoid left ICA. At this time the Zoom aspiration catheter was advanced without difficulty to the mid M1 segment. Control aspiration was then performed for approximately a minute and a half at the hub of the Zoom aspiration catheter, and with a 20 mL syringe at the hub of the balloon guide catheter for approximately a minute and a half. The combination was then retrieved and removed. Following reversal of flow arrest in the left internal carotid artery a control arteriogram performed demonstrated complete revascularization of the supraclinoid left ICA with opacification of the left middle cerebral artery achieving a TICI 3 revascularization. Also noted was opacification of the proximal left A1 segment with focal areas of severe stenosis due to arteriosclerosis. Bilateral PCOMs demonstrated opacification into the left posterior cerebral artery P1 segment. Underlying intracranial arteriosclerosis with resulting severe stenosis was noted just proximal to the origins of the posterior communicating arteries. Through the balloon guide catheter in the distal left ICA, an aspiration 5 French 115 cm guide catheter was then advanced to the proximal cavernous segment over an 035 inch guidewire. Through this, 160 cm Trevo ProVue microcatheter was advanced over an 018 inch micro guidewire. The microcatheter and the micro guidewire were advanced to the distal M2 M3 regions of the inferior division followed by the microcatheter. The guidewire was removed. Good  aspiration obtained from the hub of the microcatheter. This in turn was exchanged for an 014 inch 300 cm exchange micro guidewire with a moderate J configuration at its distal end. A control arteriogram performed demonstrated safe positioning of the tip of the micro guidewire. Measurements were then performed of the left ICA supraclinoid segment just proximal to the bifurcation and just distal to the origin of the ophthalmic artery. A 3 mm x 12 mm Onyx Frontier balloon mounted stent which had been prepped antegradely with 50% contrast and 50% heparinized saline infusion, and retrogradely with heparinized saline infusion, was advanced in a coaxial manner and with constant heparinized saline infusion and positioned without difficulty with the stent markers adequate distant from the area of severe stenosis. A control inflation was then performed using a micro inflation syringe device via micro tubing to just over 3 mm x 2 where it was maintained for approximately a minute and a half. Following balloon deflation and retrieval and removal, a  control arteriogram performed through the 5 [place] intermediate catheter demonstrated significantly improved caliber of the supraclinoid left ICA with a TICI 3 revascularization of the left MCA being persistent. Again demonstrated was the stenosed left anterior cerebral A1 segment. Over the exchange micro guidewire now in the proximal cavernous segment, a control arteriogram performed through the balloon guide catheter following removal of the intermediate catheter. Measurements were then performed of the left internal carotid artery proximally at the site of the significant stenosis and the distal left common carotid artery. A 6/8 mm x 40 mm Xact stent delivery apparatus was then advanced in a coaxial manner and with constant heparinized saline infusion and positioned without difficulty with the adequate coverage distal to the angioplastied segment and into the distal left common  carotid artery. The stent was then deployed in the usual manner without any difficulty. The delivery apparatus was removed. A control arteriogram performed through the balloon guide catheter demonstrated significantly improved caliber through the stented segment of the proximal left ICA. The waist at the site of the angioplasty was then treated with a 5 mm x 30 mm Viatrac angioplasty microcatheter which was advanced after its preparation as described above. Prior to the angioplasty, proximal flow arrest was initiated by inflating the balloon in the distal left common carotid artery, and with proximal aspiration during the angioplasty. Following the angioplasty, the balloon was retrieved and removed while aspiration was maintained as flow arrest was reversed. Control arteriograms were then performed through the balloon guide catheter over the next 10-20 minutes continued to demonstrate excellent flow through the centered in the proximal left ICA. Intracranially no change was seen in the TICI 3 revascularization of the left middle cerebral artery distribution. A 5 French diagnostic catheter was then advanced into the right common carotid artery over an 035 inch guidewire. The right common carotid artery injection demonstrated patency of the right internal carotid artery at the neck, and intracranially. The petrous, the cavernous and the supraclinoid right ICA demonstrate wide patency. The right middle cerebral artery and the right anterior cerebral artery demonstrate wide patency into the capillary and venous phases with a 50% stenosis of the right middle cerebral artery M1 segment. Cross-filling via the anterior communicating artery of the left anterior cerebral A2 segment, and the left M1 segment was evident. An 8 French Angio-Seal closure device was deployed at the right groin puncture site for hemostasis. Distal pulses remained present unchanged in both feet at the end of the procedure. Flat panel CT of the brain  demonstrated no evidence of intracranial hemorrhage. Patient was extubated in was beginning to respond to simple commands appropriately slightly weaker on the right side. Medications utilized during the procedure. 81 mg of aspirin and Plavix 15 mg via orogastric tube prior to angioplasty. The patient was also given half bolus dose of cangrelor followed by a 4 hour half dose infusion to be followed by CT of the brain. Patient was then transferred to the neuro ICU for post revascularization care. 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 Underlying a significant atherosclerotic related stenosis treated with a 3 mm x 12 mm Onyx Frontier balloon mounted stent with significantly improved caliber and flow. Status post stent assisted angioplasty of proximal left ICA stenosis with proximal flow arrest as described without event. PLAN: Follow-up in the  clinic 4 weeks post discharge. Electronically Signed   By: Julieanne Cotton M.D.   On: 02/02/2023 07:58   IR Intra Cran Stent Result Date: 02/02/2023 INDICATION: History of repeated falls. Patient with new onset expressive aphasia and possible right sided weakness. 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 Also noted mid basilar artery occlusion. EXAM: 1. EMERGENT LARGE VESSEL OCCLUSION THROMBOLYSIS (anterior CIRCULATION) COMPARISON:  Recent CT angiogram of the head and neck, and MRI of the brain. MEDICATIONS: Ancef 2 g IV was administered within 1 hour of the procedure. ANESTHESIA/SEDATION: General anesthesia. CONTRAST:  Omnipaque 300 approximately 110 mL. FLUOROSCOPY TIME:  Fluoroscopy Time: 31 minutes 42 seconds (1110 mGy). COMPLICATIONS: None immediate. TECHNIQUE: Following a full  explanation of the procedure along with the potential associated complications, an informed witnessed consent was obtained. The risks of intracranial hemorrhage of 10%, worsening neurological deficit, ventilator dependency, death and inability to revascularize were all reviewed in detail with the patient. The patient was then put under general anesthesia by the Department of Anesthesiology at [facility]. The right groin was prepped and draped in the usual sterile fashion. Thereafter using modified Seldinger technique, transfemoral access into the right common femoral artery was obtained without difficulty. Over an 0.035 inch guidewire an 8 French 25 cm Pinnacle sheath was inserted. Through this, and also over an 0.035 inch guidewire a combination of a 125 cm 6 [place] Berenstein support catheter inside of an 087 95 cm balloon guide catheter was advanced to the aortic arch region and advanced to the distal left common carotid artery. The guidewire, and the support catheter were removed. Good aspiration was obtained from the hub of the balloon guide catheter. A control arteriogram was then performed centered extra cranially and intracranially. FINDINGS: The left common carotid arteriogram demonstrates the left external carotid artery and its major branches to be widely patent. The left internal carotid artery just distal to the bulb has a focal severe stenosis. 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 PROCEDURE: Through the balloon guide catheter in the distal left ICA, and over an 014 inch 300 cm exchange micro guidewire positioned in the horizontal petrous segment, control angioplasty of the proximal left internal carotid stenosis was performed with a 4 mm x 30 cm Viatrac 14 angioplasty balloon catheter which had been prepped with 50% contrast and 50%  heparinized saline infusion. Using the rapid exchange technique, the balloon was positioned with its markers adequate distant from the site of severe stenosis. A control inflation was then performed using a micro inflation syringe device via micro tubing to approximately 4 mm where it was maintained for approximately a minute and a half. Thereafter, following deflation of the balloon and removal a control arteriogram performed through the balloon guide catheter demonstrated now improved caliber of the angioplastied segment. At this time a 071 Zoom aspiration catheter was advanced in combination with an 021 160 cm Trevo ProVue microcatheter over the exchange micro guidewire to the proximal petrous segment. The 300 cm 014 exchange micro guidewire was then exchanged for an 018 inch micro guidewire with a moderate J configuration distally. The micro guidewire with the microcatheter was then gently advanced without difficulty through the occluded supraclinoid left ICA to the middle cerebral artery inferior division M2 segment followed by  the microcatheter. The guidewire was removed. Good aspiration obtained from the hub of the microcatheter. A gentle control arteriogram performed through this demonstrated safe positioning of the tip of the microcatheter. A 4 mm x 40 mm Solitaire X retrieval device was then deployed with the proximal portion in the supraclinoid left ICA. At this time the Zoom aspiration catheter was advanced without difficulty to the mid M1 segment. Control aspiration was then performed for approximately a minute and a half at the hub of the Zoom aspiration catheter, and with a 20 mL syringe at the hub of the balloon guide catheter for approximately a minute and a half. The combination was then retrieved and removed. Following reversal of flow arrest in the left internal carotid artery a control arteriogram performed demonstrated complete revascularization of the supraclinoid left ICA with opacification  of the left middle cerebral artery achieving a TICI 3 revascularization. Also noted was opacification of the proximal left A1 segment with focal areas of severe stenosis due to arteriosclerosis. Bilateral PCOMs demonstrated opacification into the left posterior cerebral artery P1 segment. Underlying intracranial arteriosclerosis with resulting severe stenosis was noted just proximal to the origins of the posterior communicating arteries. Through the balloon guide catheter in the distal left ICA, an aspiration 5 French 115 cm guide catheter was then advanced to the proximal cavernous segment over an 035 inch guidewire. Through this, 160 cm Trevo ProVue microcatheter was advanced over an 018 inch micro guidewire. The microcatheter and the micro guidewire were advanced to the distal M2 M3 regions of the inferior division followed by the microcatheter. The guidewire was removed. Good aspiration obtained from the hub of the microcatheter. This in turn was exchanged for an 014 inch 300 cm exchange micro guidewire with a moderate J configuration at its distal end. A control arteriogram performed demonstrated safe positioning of the tip of the micro guidewire. Measurements were then performed of the left ICA supraclinoid segment just proximal to the bifurcation and just distal to the origin of the ophthalmic artery. A 3 mm x 12 mm Onyx Frontier balloon mounted stent which had been prepped antegradely with 50% contrast and 50% heparinized saline infusion, and retrogradely with heparinized saline infusion, was advanced in a coaxial manner and with constant heparinized saline infusion and positioned without difficulty with the stent markers adequate distant from the area of severe stenosis. A control inflation was then performed using a micro inflation syringe device via micro tubing to just over 3 mm x 2 where it was maintained for approximately a minute and a half. Following balloon deflation and retrieval and removal, a  control arteriogram performed through the 5 [place] intermediate catheter demonstrated significantly improved caliber of the supraclinoid left ICA with a TICI 3 revascularization of the left MCA being persistent. Again demonstrated was the stenosed left anterior cerebral A1 segment. Over the exchange micro guidewire now in the proximal cavernous segment, a control arteriogram performed through the balloon guide catheter following removal of the intermediate catheter. Measurements were then performed of the left internal carotid artery proximally at the site of the significant stenosis and the distal left common carotid artery. A 6/8 mm x 40 mm Xact stent delivery apparatus was then advanced in a coaxial manner and with constant heparinized saline infusion and positioned without difficulty with the adequate coverage distal to the angioplastied segment and into the distal left common carotid artery. The stent was then deployed in the usual manner without any difficulty. The delivery apparatus was removed. A control  arteriogram performed through the balloon guide catheter demonstrated significantly improved caliber through the stented segment of the proximal left ICA. The waist at the site of the angioplasty was then treated with a 5 mm x 30 mm Viatrac angioplasty microcatheter which was advanced after its preparation as described above. Prior to the angioplasty, proximal flow arrest was initiated by inflating the balloon in the distal left common carotid artery, and with proximal aspiration during the angioplasty. Following the angioplasty, the balloon was retrieved and removed while aspiration was maintained as flow arrest was reversed. Control arteriograms were then performed through the balloon guide catheter over the next 10-20 minutes continued to demonstrate excellent flow through the centered in the proximal left ICA. Intracranially no change was seen in the TICI 3 revascularization of the left middle cerebral  artery distribution. A 5 French diagnostic catheter was then advanced into the right common carotid artery over an 035 inch guidewire. The right common carotid artery injection demonstrated patency of the right internal carotid artery at the neck, and intracranially. The petrous, the cavernous and the supraclinoid right ICA demonstrate wide patency. The right middle cerebral artery and the right anterior cerebral artery demonstrate wide patency into the capillary and venous phases with a 50% stenosis of the right middle cerebral artery M1 segment. Cross-filling via the anterior communicating artery of the left anterior cerebral A2 segment, and the left M1 segment was evident. An 8 French Angio-Seal closure device was deployed at the right groin puncture site for hemostasis. Distal pulses remained present unchanged in both feet at the end of the procedure. Flat panel CT of the brain demonstrated no evidence of intracranial hemorrhage. Patient was extubated in was beginning to respond to simple commands appropriately slightly weaker on the right side. Medications utilized during the procedure. 81 mg of aspirin and Plavix 15 mg via orogastric tube prior to angioplasty. The patient was also given half bolus dose of cangrelor followed by a 4 hour half dose infusion to be followed by CT of the brain. Patient was then transferred to the neuro ICU for post revascularization care. 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 Underlying a significant atherosclerotic related stenosis treated with a 3 mm x 12 mm Onyx Frontier balloon mounted stent with significantly improved caliber and flow. Status post stent assisted angioplasty of proximal left ICA stenosis with proximal flow arrest as described without event. PLAN:  Follow-up in the clinic 4 weeks post discharge. Electronically Signed   By: Julieanne Cotton M.D.   On: 02/02/2023 07:58   ECHOCARDIOGRAM COMPLETE Result Date: 02/01/2023    ECHOCARDIOGRAM REPORT   Patient Name:   Grant Berry Date of Exam: 02/01/2023 Medical Rec #:  [id number]          Height:       67.0 in Accession #:    [id number]         Weight:       119.3 lb Date of Birth:  June 07, 1945          BSA:          1.623 m Patient Age:    77 years           BP:           172/87 mmHg Patient Gender: M  HR:           92 bpm. Exam Location:  Inpatient Procedure: 2D Echo, Cardiac Doppler and Color Doppler Indications:    Stroke  History:        Patient has prior history of Echocardiogram examinations, most                 recent 09/02/2019. Stroke; Risk Factors:Hypertension and                 Dyslipidemia.  Sonographer:    Milda Smart Referring Phys: [id number] [facility]  Sonographer Comments: Suboptimal parasternal window. Image acquisition challenging due to patient body habitus and Image acquisition challenging due to respiratory motion. IMPRESSIONS  1. Left ventricular ejection fraction, by estimation, is 60 to 65%. The left ventricle has normal function. The left ventricle has no regional wall motion abnormalities. Left ventricular diastolic parameters are consistent with Grade I diastolic dysfunction (impaired relaxation).  2. Right ventricular systolic function is normal. The right ventricular size is normal.  3. The mitral valve is abnormal. No evidence of mitral valve regurgitation. No evidence of mitral stenosis.  4. The aortic valve was not well visualized. There is mild calcification of the aortic valve. There is mild thickening of the aortic valve. Aortic valve regurgitation is not visualized. Aortic valve sclerosis is present, with no evidence of aortic valve  stenosis.  5. The inferior vena cava is normal in size with greater than 50% respiratory variability, suggesting  right atrial pressure of 3 mmHg. FINDINGS  Left Ventricle: Left ventricular ejection fraction, by estimation, is 60 to 65%. The left ventricle has normal function. The left ventricle has no regional wall motion abnormalities. The left ventricular internal cavity size was normal in size. There is  no left ventricular hypertrophy. Left ventricular diastolic parameters are consistent with Grade I diastolic dysfunction (impaired relaxation). Right Ventricle: The right ventricular size is normal. No increase in right ventricular wall thickness. Right ventricular systolic function is normal. Left Atrium: Left atrial size was normal in size. Right Atrium: Right atrial size was normal in size. Pericardium: There is no evidence of pericardial effusion. Mitral Valve: The mitral valve is abnormal. There is mild thickening of the mitral valve leaflet(s). There is mild calcification of the mitral valve leaflet(s). Mild mitral annular calcification. No evidence of mitral valve regurgitation. No evidence of mitral valve stenosis. MV peak gradient, 5.8 mmHg. The mean mitral valve gradient is 3.0 mmHg. Tricuspid Valve: The tricuspid valve is normal in structure. Tricuspid valve regurgitation is not demonstrated. No evidence of tricuspid stenosis. Aortic Valve: The aortic valve was not well visualized. There is mild calcification of the aortic valve. There is mild thickening of the aortic valve. Aortic valve regurgitation is not visualized. Aortic valve sclerosis is present, with no evidence of aortic valve stenosis. Pulmonic Valve: The pulmonic valve was normal in structure. Pulmonic valve regurgitation is not visualized. No evidence of pulmonic stenosis. Aorta: The aortic root is normal in size and structure. Venous: The inferior vena cava is normal in size with greater than 50% respiratory variability, suggesting right atrial pressure of 3 mmHg. IAS/Shunts: No atrial level shunt detected by color flow Doppler.  LEFT VENTRICLE PLAX  2D LVIDd:         4.40 cm   Diastology LVIDs:         3.20 cm   LV e' medial:    6.20 cm/s LV PW:         0.80 cm  LV E/e' medial:  13.4 LV IVS:        0.60 cm   LV e' lateral:   7.94 cm/s LVOT diam:     2.50 cm   LV E/e' lateral: 10.5 LVOT Area:     4.91 cm  RIGHT VENTRICLE             IVC RV S prime:     17.80 cm/s  IVC diam: 1.40 cm TAPSE (M-mode): 2.5 cm LEFT ATRIUM             Index        RIGHT ATRIUM           Index LA diam:        3.30 cm 2.03 cm/m   RA Area:     11.70 cm LA Vol (A2C):   19.1 ml 11.77 ml/m  RA Volume:   26.80 ml  16.51 ml/m LA Vol (A4C):   27.2 ml 16.76 ml/m LA Biplane Vol: 23.8 ml 14.66 ml/m   AORTA Ao Root diam: 3.60 cm Ao Asc diam:  3.40 cm MITRAL VALVE MV Area (PHT): 1.73 cm     SHUNTS MV Peak grad:  5.8 mmHg     Systemic Diam: 2.50 cm MV Mean grad:  3.0 mmHg MV Vmax:       1.20 m/s MV Vmean:      83.8 cm/s MV Decel Time: 438 msec MV E velocity: 83.30 cm/s MV A velocity: 108.00 cm/s MV E/A ratio:  0.77 Charlton Haws MD Electronically signed by Charlton Haws MD Signature Date/Time: 02/01/2023/3:01:07 PM    Final    MR BRAIN WO CONTRAST Result Date: 02/01/2023 CLINICAL DATA:  Stroke and embolectomy. EXAM: MRI HEAD WITHOUT CONTRAST TECHNIQUE: Multiplanar, multiecho pulse sequences of the brain and surrounding structures were obtained without intravenous contrast. COMPARISON:  Head CT and MRI from yesterday FINDINGS: Brain: Moderate acute infarct in the left occipital cortex, increased from yesterday. Punctate acute infarct in the superior left frontal white matter since prior. There are areas of low-grade restricted diffusion in the left periatrial white matter and right occipital pole attributed to subacute ischemia. Subarachnoid hemorrhage along the anterior left frontal convexity, distinct from any areas of acute infarction and known from prior head CT. Pre-existing bilateral cerebellar and brainstem small infarcts. Chronic lacunar infarcts in theright caudate. Ischemic gliosis  is mild. 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 Generalized brain atrophy. Vascular: No flow seen in the left vertebral artery. There is a basilar flow void. Skull and upper cervical spine: No focal marrow lesion Sinuses/Orbits: No acute finding IMPRESSION: 1. Moderate acute infarct in the left occipital lobe with mild progression from yesterday. Interval punctate acute infarct in the left frontal white matter. 2. Areas of chronic and subacute ischemia preferentially affecting the posterior circulation. 3. Known subarachnoid hemorrhage at the anterior left frontal convexity, stable from preceding head CT. Electronically Signed   By: Tiburcio Pea M.D.   On: 02/01/2023 04:58   CT HEAD WO CONTRAST ( ) Result Date: 01/31/2023 CLINICAL DATA:  Stroke, follow-up; post canrelor infusion EXAM: CT HEAD WITHOUT CONTRAST TECHNIQUE: Contiguous axial images were obtained from the base of the skull through the vertex without intravenous contrast. RADIATION DOSE REDUCTION: This exam was performed according to the departmental dose-optimization program which includes automated exposure control, adjustment of the mA and/or kV according to patient size and/or use of iterative reconstruction technique. COMPARISON:  CT head 10/01/2022 and intra procedural CT 10/01/2022  FINDINGS: Brain: Hyperdense material in the left frontal sulci (series 5, image 47 and 60 and series 3, images 15-18 and 25 area and series 3, image 25), which may represent subarachnoid hemorrhage versus contrast staining. This was not apparent on the immediate postprocedural CT head. Redemonstrated cytotoxic edema in the in the left occipital lobe (series 3, image 14), which is slightly more prominent than on the prior exam. More heterogeneous hypodensity in the right occipital lobe, which is favored to be chronic remote infarcts in the pons and bilateral cerebellar hemispheres. No evidence of acute infarction,  hemorrhage, mass, mass effect, or midline shift. No hydrocephalus or extra-axial fluid collection. Vascular: No hyperdense vessel. Atherosclerotic calcifications in the intracranial carotid and vertebral arteries. Skull: Negative for fracture or focal lesion. Sinuses/Orbits: Mucosal thickening in the ethmoid air cells. No acute finding in the orbits. Status post bilateral lens replacements. Other: The mastoid air cells are well aerated. IMPRESSION: 1. Hyperdense material in the left frontal sulci, which may represent subarachnoid hemorrhage versus contrast staining. This was not apparent on the immediate postprocedural CT head. Recommend short-term follow-up head CT. 2. Redemonstrated cytotoxic edema in the left occipital lobe, which is slightly more prominent than on the prior exam. These results will be called to the ordering clinician or representative by the Radiologist Assistant, and communication documented in the PACS or [employer]. Electronically Signed   By: Wiliam Ke M.D.   On: 01/31/2023 20:52   CT CEREBRAL PERFUSION W CONTRAST Result Date: 01/31/2023 CLINICAL DATA:  Code stroke. 77 year old male. CTA yesterday demonstrating left ICA terminus, distal left vertebral artery, Basilar artery occlusions. EXAM: CT PERFUSION BRAIN TECHNIQUE: Multiphase CT imaging of the brain was performed following IV bolus contrast injection. Subsequent parametric perfusion maps were calculated using RAPID software. RADIATION DOSE REDUCTION: This exam was performed according to the departmental dose-optimization program which includes automated exposure control, adjustment of the mA and/or kV according to patient size and/or use of iterative reconstruction technique. CONTRAST:  40mL OMNIPAQUE IOHEXOL 350 MG/ML SOLN COMPARISON:  Head CTs, CTA head and neck, and brain MRI earlier today. FINDINGS: CT Brain Perfusion Findings: CBF (<30%) Volume: 0mL. No CBF or CBV parameter abnormality is detected. Perfusion  (Tmax>6.0s) volume: . Fairly symmetric abnormal T-max throughout the bilateral cerebellum, posterior cerebral hemispheres. T-max > 4 seconds is more pronounced in the left cerebral hemisphere including the left MCA territory. There is volume of minimal T-max > 10 seconds in the right posterior circulation. Mismatch Volume: Infarct Core: No infarct core is detected Infarction Location:Oligemia throughout the posterior circulation, to a lesser extent the left MCA territory. IMPRESSION: Oligemia throughout the bilateral posterior circulation, including the posterior fossa. Less pronounced oligemia in the Left MCA territory. No infarct core, CBF or CBV parameter abnormality detected by CTP. These results were communicated to Dr. Roda Shutters at 8:12 am on 01/31/2023 by text page via the [facility] messaging system. Electronically Signed   By: Odessa Fleming M.D.   On: 01/31/2023 08:12   CT HEAD CODE STROKE WO CONTRAST Result Date: 01/31/2023 CLINICAL DATA:  Code stroke. 77 year old male. CTA yesterday demonstrating left ICA terminus, distal left vertebral artery, Basilar artery occlusions. EXAM: CT HEAD WITHOUT CONTRAST TECHNIQUE: Contiguous axial images were obtained from the base of the skull through the vertex without intravenous contrast. RADIATION DOSE REDUCTION: This exam was performed according to the departmental dose-optimization program which includes automated exposure control, adjustment of the mA and/or kV according to patient size and/or use of iterative reconstruction technique.  COMPARISON:  Brain MRI 0244 hours today. CTA head and neck 0056 hours today. Plain head CT 0050 hours today. FINDINGS: Brain: Left occipital pole cytotoxic edema is slightly more conspicuous on CT now. Heterogeneous right occipital pole redemonstrated. Chronic left central pontine and bilateral cerebellar infarcts on MRI appears stable by CT. No acute intracranial hemorrhage identified. No new cytotoxic edema compared to the earlier MRI.  No midline shift, mass effect, or evidence of intracranial mass lesion. No ventriculomegaly. Vascular: Extensive Calcified atherosclerosis at the skull base. See also CTA earlier today. Skull: Stable and intact. Sinuses/Orbits: Visualized paranasal sinuses and mastoids are clear. Other: No acute orbit or scalp soft tissue finding. ASPECTS [facility] Stroke Program Early CT Score) Total score (0-10 with 10 being normal): 10; occipital pole cytotoxic edema. IMPRESSION: 1. Stable non contrast CT appearance of the brain since 0050 hours today: Occipital pole Cytotoxic edema stable from the MRI at 0242 hours today. No new edema, no intracranial hemorrhage, or mass effect. 2. 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 Electronically Signed   By: Odessa Fleming M.D.   On: 01/31/2023 08:08   MR BRAIN WO CONTRAST Result Date: 01/31/2023 CLINICAL DATA:  Dizziness and gait instability. Large vessel occlusion. EXAM: MRI HEAD WITHOUT CONTRAST TECHNIQUE: Multiplanar, multiecho pulse sequences of the brain and surrounding structures were obtained without intravenous contrast. COMPARISON:  CTA head neck 01/31/2023 Brain MRI 09/01/2019 FINDINGS: Brain: There are areas of acute/early subacute ischemia within both occipital lobes, left-greater-than-right. These areas correspond to the areas of infarction seen on the earlier noncontrast head CT (suggested to be chronic at that time). There is no acute ischemia of the brainstem, cerebellum or anterior circulation. There is multifocal hyperintense T2-weighted signal within the white matter. Generalized volume loss. There is hyperintense T2-weighted signal that corresponds to both sites of infarction. There is a component of chronic infarct within the superior right occipital lobe (series 11, image 25). There are multiple old cerebellar infarcts. The midline structures are normal. Vascular: Abnormal distal basilar artery flow void in keeping with known occlusion. Abnormal flow  void at the skull base left ICA covers a longer segment than the demonstrated occlusion on the earlier CTA, likely due to inhibited upstream flow. Skull and upper cervical spine: Normal calvarium and skull base. Visualized upper cervical spine and soft tissues are normal. Sinuses/Orbits:No paranasal sinus fluid levels or advanced mucosal thickening. No mastoid or middle ear effusion. Normal orbits. IMPRESSION: 1. Acute/early subacute infarcts within both occipital lobes, left-greater-than-right. 2. No brainstem, cerebellar or anterior circulation acute ischemia. 3. Abnormal skull base flow voids in keeping with known vascular and left ICA occlusions. 4. Old superior right occipital and bilateral cerebellar infarcts. Electronically Signed   By: Deatra Robinson M.D.   On: 01/31/2023 03:24   CT ANGIO HEAD NECK W WO CM (CODE STROKE) Addendum Date: 01/31/2023 ADDENDUM REPORT: 01/31/2023 02:45 ADDENDUM: In addition to the above described ICA occlusion, the distal basilar artery is occluded. There is reconstitution of the basilar tip with patency of both PCAs maintained. The superior cerebellar arteries are occluded at their origin. Please note that this addendum was previously mistakenly associated with the noncontrast head CT for this patient performed on the same day. Electronically Signed   By: Deatra Robinson M.D.   On: 01/31/2023 02:45   Result Date: 01/31/2023 CLINICAL DATA:  Fall with dysarthria and dizziness EXAM: CT ANGIOGRAPHY HEAD AND NECK WITH AND WITHOUT CONTRAST TECHNIQUE: Multidetector CT imaging of the head and neck was performed  using the standard protocol during bolus administration of intravenous contrast. Multiplanar CT image reconstructions and MIPs were obtained to evaluate the vascular anatomy. Carotid stenosis measurements (when applicable) are obtained utilizing NASCET criteria, using the distal internal carotid diameter as the denominator. RADIATION DOSE REDUCTION: This exam was performed  according to the departmental dose-optimization program which includes automated exposure control, adjustment of the mA and/or kV according to patient size and/or use of iterative reconstruction technique. CONTRAST:  75mL OMNIPAQUE IOHEXOL 350 MG/ML SOLN COMPARISON:  None Available. FINDINGS: CTA NECK FINDINGS SKELETON: No acute abnormality or high grade bony spinal canal stenosis. OTHER NECK: Normal pharynx, larynx and major salivary glands. No cervical lymphadenopathy. Unremarkable thyroid gland. UPPER CHEST: No pneumothorax or pleural effusion. No nodules or masses. AORTIC ARCH: There is calcific atherosclerosis of the aortic arch. Conventional 3 vessel aortic branching pattern. RIGHT CAROTID SYSTEM: No dissection, occlusion or aneurysm. There is mixed density atherosclerosis extending into the proximal ICA, resulting in less than 50% stenosis. LEFT CAROTID SYSTEM: No dissection, occlusion or aneurysm. There is mixed density atherosclerosis extending into the proximal ICA, resulting in 60% stenosis. VERTEBRAL ARTERIES: Right dominant configuration. Diminutive left vertebral artery with probable short segment occlusion of the V3 segment. Normal right vertebral artery to the skull base. CTA HEAD FINDINGS POSTERIOR CIRCULATION: Right vertebral artery atherosclerosis with moderate stenosis. Normal left V4 segment. No proximal occlusion of the anterior or inferior cerebellar arteries. Basilar artery is normal. Superior cerebellar arteries are normal. Posterior cerebral arteries are normal. ANTERIOR CIRCULATION: The left ICA is occluded at its terminus within occluded length of approximately 9 mm. The right ICA is mildly atherosclerotic without stenosis. Anterior cerebral arteries are normal. Moderate multifocal stenosis of the M1 segment of the right MCA. The left MCA is patent, likely filled by collateral flow along the circle-of-Willis. There is multifocal mild atherosclerotic irregularity without high-grade  stenosis. VENOUS SINUSES: As permitted by contrast timing, patent. ANATOMIC VARIANTS: None Review of the MIP images confirms the above findings. IMPRESSION: 1. 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 The left MCA is patent, likely filled by collateral flow along the circle-of-Willis. 2. Moderate multifocal stenosis of the M1 segment of the right MCA. 3. Moderate right vertebral artery stenosis. 4. Bilateral carotid bifurcation atherosclerosis with 60% stenosis of the proximal left ICA. 5. Diminutive left vertebral artery with probable short segment occlusion of the V3 segment. Aortic Atherosclerosis (ICD10-I70.0). Critical Value/emergent results were called by telephone at the time of interpretation on 01/31/2023 at 1:17 am to provider [facility] , who verbally acknowledged these results. Electronically Signed: By: Deatra Robinson M.D. 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 There is reconstitution of the basilar tip with patency of both PCAs maintained. The superior cerebellar arteries are occluded at their origin. Electronically Signed   By: Deatra Robinson M.D.   On: 01/31/2023 01:30   Result Date: 01/31/2023 CLINICAL DATA:  Code stroke.  Fall with dizziness EXAM: CT HEAD WITHOUT CONTRAST TECHNIQUE: Contiguous axial images were obtained from the base of the skull through the vertex without intravenous contrast. RADIATION DOSE REDUCTION: This exam was performed according to the departmental dose-optimization program which includes automated exposure control, adjustment of the mA and/or kV according to patient size and/or use of iterative reconstruction technique. COMPARISON:  None  Available. FINDINGS: Brain: There is no mass, hemorrhage or extra-axial collection. The  size and configuration of the ventricles and extra-axial CSF spaces are normal. There are multiple old cerebellar small vessel infarcts. Bilateral PCA territory infarctions also appear chronic. There is mild white matter hypoattenuation. Vascular: Atherosclerosis of the carotid and vertebral arteries at the skull base. Skull: Normal Sinuses/Orbits: No fluid levels or advanced mucosal thickening of the visualized paranasal sinuses. No mastoid or middle ear effusion. The orbits are normal. ASPECTS [facility] Stroke Program Early CT Score) - Ganglionic level infarction (caudate, lentiform nuclei, internal capsule, insula, M1-M3 cortex): 7 - Supraganglionic infarction (M4-M6 cortex): 3 Total score (0-10 with 10 being normal): 10 IMPRESSION: 1. No acute intracranial abnormality. 2. ASPECTS is 10. 3. 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 4. Bilateral PCA territory infarctions also appear chronic. These results were called by telephone at the time of interpretation on 01/31/2023 at 1:00 am to provider [facility] , who verbally acknowledged these results. Electronically Signed: By: Deatra Robinson M.D. On: 01/31/2023 01:00   CT C-SPINE NO CHARGE Result Date: 01/31/2023 CLINICAL DATA:  Neck pain, known left ICA occlusion EXAM: CT CERVICAL SPINE WITHOUT CONTRAST TECHNIQUE: Multidetector CT imaging of the cervical spine was performed without intravenous contrast. Multiplanar CT image reconstructions were also generated. RADIATION DOSE REDUCTION: This exam was performed according to the departmental dose-optimization program which includes automated exposure control, adjustment of the mA and/or kV according to patient size and/or use of iterative reconstruction technique. COMPARISON:  None Available. FINDINGS: Alignment: Within normal limits. Skull base and vertebrae: 7 cervical segments are well visualized. Vertebral body height is well maintained. Mild osteophytic change and facet hypertrophic changes noted. No  acute fracture or acute facet abnormality is noted. The odontoid is within normal limits. Soft tissues and spinal canal: Surrounding soft tissue structures appear within normal limits. Upper chest: Visualized lung apices are unremarkable with the exception of some scarring in the right apex. Other: None IMPRESSION: Multilevel degenerative change without acute bony abnormality. Electronically Signed   By: Alcide Clever M.D.   On: 01/31/2023 01:25       HISTORY OF PRESENT ILLNESS 77 y.o. patient with history of  hypertension, prior left MCA stroke, who presented on 01/31/23 with multiple falls that day.  He was initially seen in [facility] and a code stroke was called.  CT of the head without contrast noted prior left cerebellar infarcts and right PCA infarct as well as a subacute left PCA infarct.  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  He was transferred emergently to [facility] where he had an MRI without contrast demonstrating an acute left MCA/PCA infarct.  Patient underwent emergent left terminal ICA stenting and proximal ICA angioplasty by interventional radiology.  Patient was outside of the window for tPA.  Postintervention CT revealed progressive infarct in the left PCA distribution.  General -well-nourished, well-developed elderly patient in no acute distress Cardiovascular - Regular rhythm and rate..   Neuro - Awake and alert.  Patient will respond to name and intermittently follow commands.   He has left gaze preference but can look to the right past midline he blinks to threat on the right but not on the left.   He has an apparent right hemianopsia. He will move all 4 extremities with antigravity strength.   HOSPITAL COURSE CT head old left cerebellum and right PCA infarct.  Subacute left PCA  infarct CTA head and neck mid basilar artery and ICA terminal occlusion.  Left ICA proximal 60% stenosis, right  multifocal M1 moderate stenosis, right VA stenosis. MRI left MCA/PCA small to moderate infarct.  Old right PCA infarct. Due to developing expressive aphasia, repeat CT showed no acute change CTP no core infarct, increased TTP at posterior circulation and left MCA territory Status post IR with terminal ICA stenting and proximal ICA angioplasty with TICI3 reperfusion Post IR CT repeat left PCA progressive infarct. Hyperdense material in the left frontal sulci, which may represent subarachnoid hemorrhage versus contrast staining. MRI repeat Moderate acute infarct in the left occipital lobe with mild progression from yesterday. Interval punctate acute infarct in the left frontal white matter. 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 MRI repeat 12/12 showed Interval hemorrhagic transformation with a 2.7 x 1.5 cm hematoma at the left occipital lobe. Additional new acute intraparenchymal hemorrhage at the posterior left basal ganglia measuring 2.2 x 1.9 cm CT repeat done 12/14 and stable.  2D Echo EF 60 to 65% LDL 167 HgbA1c 5.1 UDS negative Lovenox for VTE prophylaxis No antithrombotic prior to admission, now on aspirin 81 mg daily and clopidogrel 75 mg daily for intracranial stenting.  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 Bilateral P-comm and PCA patent Avoid low BP No intervention needed at this time, discussed with Dr. Corliss Skains   History of stroke 08/2019 admitted for dizziness, imbalance and leaning towards to the left.  CT showed left cerebellar infarct which confirmed on MRI.  MRI showed left SCA occlusion, left VA hypoplastic.  2D echo unremarkable.  Carotid Doppler left ICA 50 to 69%  stenosis.  Discharged on DAPT and Lipitor. Follow-up with Dr. Pearlean Brownie at [facility]   Delirium, improving Less restless and agitation now Tele sitter is on at night, will try to discontinue today On seroquel 25 bid -> 25 am and 50 hs   Hx of hypertension Hypotension post procedure Stable now S/p albumin Avoid low BP- Norvasc discontinued  Long term BP goal normotensive   Hyperlipidemia Home meds: Zetia LDL 167, goal < 70 Statin intolerance (Lipitor and pravastatin) Continue Zetia at discharge Not candidate for Leqvio    Other Stroke Risk Factors Advanced age Former smoker   Other Active Problems AKI, creatinine 1.40--1.06--1.07--1.21 --1.21--1.09- 1.14  DISCHARGE EXAM  PHYSICAL EXAM General -well-nourished, well-developed elderly patient in no acute distress Cardiovascular - Regular rhythm and rate..   Neuro - Awake and alert.  Patient will respond to name and intermittently follow commands.   He has left gaze preference but can look to the right past midline he blinks to threat on the right but not on the left.   He has an apparent left hemianopsia.  He will move all 4 extremities with antigravity strength.    1a Level of Conscious.: 0 1b LOC Questions: 1 1c LOC Commands: 0 2 Best Gaze: 0 3 Visual: 1 4 Facial Palsy: 0 5a Motor Arm - left: 0 5b Motor Arm - Right: 0 6a Motor Leg - Left: 0 6b Motor Leg - Right: 0 7 Limb Ataxia: 1 8 Sensory: 0 9 Best Language: 1 10 Dysarthria: 1 11 Extinct. and Inatten.: 0 TOTAL: 5   Discharge Diet  Diet   Diet Heart Room service appropriate? Yes; Fluid consistency: Thin   liquids  DISCHARGE PLAN Disposition: Skilled nursing facility aspirin 81 mg daily and clopidogrel 75 mg daily for secondary stroke prevention Ongoing stroke risk factor control by Primary Care Physician at time of discharge Follow-up PCP Grant Earing, FNP in 2 weeks. Follow-up in [facility] in 8 weeks, office to  schedule an appointment. Able to see NP in clinic.  32 minutes were spent preparing discharge.  Patient seen and examined by NP/APP with MD. MD to update note as needed.   Elmer Picker, DNP, FNP-BC [facility] Pager: [phone]  I have personally obtained history,examined this patient, reviewed notes, independently viewed imaging studies, participated in medical decision making and plan of care.ROS completed by me personally and pertinent positives fully documented  I have made any additions or clarifications directly to the above note. Agree with note above.    Delia Heady, MD Medical Director [facility] Pager: [phone] 02/11/2023 3:49 PM

## 2023-02-11 DIAGNOSIS — R339 Retention of urine, unspecified: Secondary | ICD-10-CM | POA: Diagnosis not present

## 2023-02-11 DIAGNOSIS — I69398 Other sequelae of cerebral infarction: Secondary | ICD-10-CM | POA: Diagnosis not present

## 2023-02-11 DIAGNOSIS — H04123 Dry eye syndrome of bilateral lacrimal glands: Secondary | ICD-10-CM | POA: Diagnosis not present

## 2023-02-11 DIAGNOSIS — R410 Disorientation, unspecified: Secondary | ICD-10-CM | POA: Diagnosis not present

## 2023-02-11 DIAGNOSIS — R52 Pain, unspecified: Secondary | ICD-10-CM | POA: Diagnosis not present

## 2023-02-11 DIAGNOSIS — E039 Hypothyroidism, unspecified: Secondary | ICD-10-CM | POA: Diagnosis not present

## 2023-02-11 DIAGNOSIS — I609 Nontraumatic subarachnoid hemorrhage, unspecified: Secondary | ICD-10-CM | POA: Diagnosis not present

## 2023-02-11 DIAGNOSIS — I6322 Cerebral infarction due to unspecified occlusion or stenosis of basilar arteries: Secondary | ICD-10-CM | POA: Diagnosis not present

## 2023-02-11 DIAGNOSIS — Z79899 Other long term (current) drug therapy: Secondary | ICD-10-CM | POA: Diagnosis not present

## 2023-02-11 DIAGNOSIS — Z743 Need for continuous supervision: Secondary | ICD-10-CM | POA: Diagnosis not present

## 2023-02-11 DIAGNOSIS — E782 Mixed hyperlipidemia: Secondary | ICD-10-CM | POA: Diagnosis not present

## 2023-02-11 DIAGNOSIS — Z7401 Bed confinement status: Secondary | ICD-10-CM | POA: Diagnosis not present

## 2023-02-11 DIAGNOSIS — Z7189 Other specified counseling: Secondary | ICD-10-CM | POA: Diagnosis not present

## 2023-02-11 DIAGNOSIS — I10 Essential (primary) hypertension: Secondary | ICD-10-CM | POA: Diagnosis not present

## 2023-02-11 DIAGNOSIS — R4701 Aphasia: Secondary | ICD-10-CM | POA: Diagnosis not present

## 2023-02-11 DIAGNOSIS — R7989 Other specified abnormal findings of blood chemistry: Secondary | ICD-10-CM | POA: Diagnosis not present

## 2023-02-11 DIAGNOSIS — E559 Vitamin D deficiency, unspecified: Secondary | ICD-10-CM | POA: Diagnosis not present

## 2023-02-11 DIAGNOSIS — H53461 Homonymous bilateral field defects, right side: Secondary | ICD-10-CM | POA: Diagnosis not present

## 2023-02-11 DIAGNOSIS — R531 Weakness: Secondary | ICD-10-CM | POA: Diagnosis not present

## 2023-02-11 DIAGNOSIS — N319 Neuromuscular dysfunction of bladder, unspecified: Secondary | ICD-10-CM | POA: Diagnosis not present

## 2023-02-11 DIAGNOSIS — E785 Hyperlipidemia, unspecified: Secondary | ICD-10-CM | POA: Diagnosis not present

## 2023-02-11 DIAGNOSIS — I651 Occlusion and stenosis of basilar artery: Secondary | ICD-10-CM | POA: Diagnosis not present

## 2023-02-11 DIAGNOSIS — I69320 Aphasia following cerebral infarction: Secondary | ICD-10-CM | POA: Diagnosis not present

## 2023-02-11 DIAGNOSIS — M1991 Primary osteoarthritis, unspecified site: Secondary | ICD-10-CM | POA: Diagnosis not present

## 2023-02-11 DIAGNOSIS — G629 Polyneuropathy, unspecified: Secondary | ICD-10-CM | POA: Diagnosis not present

## 2023-02-11 DIAGNOSIS — N390 Urinary tract infection, site not specified: Secondary | ICD-10-CM | POA: Diagnosis not present

## 2023-02-11 DIAGNOSIS — I639 Cerebral infarction, unspecified: Secondary | ICD-10-CM | POA: Diagnosis not present

## 2023-02-11 MED ORDER — ASPIRIN 81 MG PO TBEC: 81.0000 mg | DELAYED_RELEASE_TABLET | Freq: Every day | ORAL | Status: DC

## 2023-02-11 MED ORDER — CLOPIDOGREL BISULFATE 75 MG PO TABS: 75.0000 mg | ORAL_TABLET | Freq: Every day | ORAL | Status: DC

## 2023-02-11 MED ORDER — QUETIAPINE FUMARATE 50 MG PO TABS: 50.0000 mg | ORAL_TABLET | Freq: Every day | ORAL | Status: DC

## 2023-02-11 MED ORDER — QUETIAPINE FUMARATE 25 MG PO TABS: 25.0000 mg | ORAL_TABLET | Freq: Every day | ORAL | Status: DC

## 2023-02-11 NOTE — Progress Notes (Signed)
Physical Therapy Treatment Patient Details Name: Grant Berry MRN: [id number] DOB: 1945-03-07 Today's Date: 02/11/2023   History of Present Illness Pt is a 77 y.o. male who presented 01/31/23 s/p multiple falls.  MRI showed left MCA/PCA infarct, chronic right PCA infarct.  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 S/p bilat common carotid arteriograms R CFA approach, complete revascularization of L ICA supraclinoid segment, treated intracranial arteriosclerotic related stenosis, angioplasty of high grade L ICA proximal stenosis 12/8. PMH: L MCA CVA, HLD, HTN    PT Comments  Pt resting in bed on arrival and agreeable to session with continued progress towards acute goals. Pt continues to require increased time and cues for initiation of all mobility, cues for sequencing and max cues for safety. Physically pt requiring light min A for bed mobility, grossly min A for transfers and mod A +2 (for chair follow as pt fatigues quickly) for gait with RW support. Pt performing serial sit<>stands for reinforcement of safe hand placement on rise and descent and increased LE strength. Pt continues to be limited by impaired cognition, weakness, decreased activity tolerance and impaired balance/postural reactions and current plan remains appropriate to address deficits and maximize functional independence and decrease caregiver burden. Pt continues to benefit from skilled PT services to progress toward functional mobility goals.     If plan is discharge home, recommend the following: A lot of help with bathing/dressing/bathroom;Assistance with cooking/housework;Assistance with feeding;Direct supervision/assist for medications management;Direct supervision/assist for financial management;Assist for transportation;Help with stairs or ramp for entrance;Supervision due to cognitive status;A lot of help with walking and/or  transfers   Can travel by private vehicle     No  Equipment Recommendations  Other (comment) (tbd)    Recommendations for Other Services       Precautions / Restrictions Precautions Precautions: Fall Precaution Comments: h/o multiple falls; reports he can't see Restrictions Weight Bearing Restrictions Per Provider Order: No     Mobility  Bed Mobility Overal bed mobility: Needs Assistance Bed Mobility: Supine to Sit     Supine to sit: HOB elevated, Min assist     General bed mobility comments: increased time and effortful, min A to elevate trunk, cues to scoot out to EOB    Transfers Overall transfer level: Needs assistance Equipment used: Rolling walker (2 wheels) Transfers: Sit to/from Stand Sit to Stand: Min assist, Mod assist           General transfer comment: mod A for initial stand from EOB as pt feet sliding anterior on rise, min A for subsequent stands from EOB, BSC and recliner, able to complete x5 at end of session with light min A without RW support, max cues for hand placement    Ambulation/Gait Ambulation/Gait assistance: Mod assist, +2 safety/equipment Gait Distance (Feet): 12 Feet (x2) Assistive device: Rolling walker (2 wheels) Gait Pattern/deviations: Step-through pattern, Decreased stride length, Decreased weight shift to right, Decreased weight shift to left, Shuffle Gait velocity: decr     General Gait Details: very short shuffling steps with flexed posture, pt expressing need for BM and to void after inital 12', BSC pulled up behing pt, sucessful BM, HR up to 130bpm   Stairs             Wheelchair Mobility     Tilt Bed    Modified Rankin (Stroke Patients Only) Modified Rankin (Stroke Patients Only) Pre-Morbid Rankin Score: No significant disability Modified Rankin:  Severe disability     Balance Overall balance assessment: Needs assistance, History of Falls Sitting-balance support: Bilateral upper extremity supported,  Feet supported Sitting balance-Leahy Scale: Fair Sitting balance - Comments: sitting EOB, CGA provided for safety during balance, cues to place feet on floor Postural control: Posterior lean Standing balance support: Bilateral upper extremity supported, Reliant on assistive device for balance Standing balance-Leahy Scale: Poor Standing balance comment: reliant on UE support during dynamic activity, able to statically stand without UE support                            Cognition Arousal: Alert Behavior During Therapy: Restless Overall Cognitive Status: No family/caregiver present to determine baseline cognitive functioning Area of Impairment: Orientation, Attention, Memory, Following commands, Safety/judgement, Problem solving, Awareness                 Orientation Level: Disoriented to, Time, Place (needing prompts for hospital, stating [place]) Current Attention Level: Focused Memory: Decreased recall of precautions, Decreased short-term memory Following Commands: Follows one step commands with increased time, Follows multi-step commands inconsistently Safety/Judgement: Decreased awareness of safety, Decreased awareness of deficits Awareness: Emergent Problem Solving: Slow processing, Decreased initiation, Difficulty sequencing, Requires verbal cues General Comments: slow to initiate mobility tasks, howwever restless seated up EOB due to feeling need to void, increased cues for safety and sequencing        Exercises Other Exercises Other Exercises: serial sit<>stand x5 for reinforcement of safe and optimal hand placement    General Comments General comments (skin integrity, edema, etc.): pt with condom cath off and soiled on arrival RN in room during session, pt continues to endorse not being able to see, however able to take washcloth and tissue from therapists and locate nurse call button, possible dipolopia? as pt with zero accuracy with identifying how many  fingers held up      Pertinent Vitals/Pain Pain Assessment Pain Assessment: No/denies pain Pain Intervention(s): Monitored during session    Home Living                          Prior Function            PT Goals (current goals can now be found in the care plan section) Acute Rehab PT Goals PT Goal Formulation: With patient Time For Goal Achievement: 02/15/23 Progress towards PT goals: Progressing toward goals    Frequency    Min 1X/week      PT Plan      Co-evaluation              AM-PAC PT "6 Clicks" Mobility   Outcome Measure  Help needed turning from your back to your side while in a flat bed without using bedrails?: A Lot Help needed moving from lying on your back to sitting on the side of a flat bed without using bedrails?: A Lot Help needed moving to and from a bed to a chair (including a wheelchair)?: A Lot Help needed standing up from a chair using your arms (e.g., wheelchair or bedside chair)?: A Lot Help needed to walk in hospital room?: Total Help needed climbing 3-5 steps with a railing? : Total 6 Click Score: 10    End of Session Equipment Utilized During Treatment: Gait belt Activity Tolerance: Patient tolerated treatment well Patient left: in chair;with call bell/phone within reach;with chair alarm set (alarm belt, door open, and RN aware of pt  location) Nurse Communication: Mobility status (blood on tissue) PT Visit Diagnosis: Other abnormalities of gait and mobility (R26.89);Muscle weakness (generalized) (M62.81)     Time: 1610-9604 PT Time Calculation (min) (ACUTE ONLY): 26 min  Charges:    $Gait Training: 8-22 mins $Therapeutic Activity: 8-22 mins PT General Charges $$ ACUTE PT VISIT: 1 Visit                    Julita Ozbun R. PTA Acute Rehabilitation Services Office: [phone]    Catalina Antigua 02/11/2023, 12:12 PM

## 2023-02-11 NOTE — Plan of Care (Signed)
  Problem: Ischemic Stroke/TIA Tissue Perfusion: Goal: Complications of ischemic stroke/TIA will be minimized 02/11/2023 0601 by Ames Coupe, RN Outcome: Progressing 02/11/2023 0501 by Ames Coupe, RN Outcome: Progressing

## 2023-02-11 NOTE — TOC Transition Note (Signed)
Transition of Care [facility]) - Discharge Note   Patient Details  Name: Grant Berry MRN: [id number] Date of Birth: 03-10-1945  Transition of Care [facility]) CM/SW Contact:  Baldemar Lenis, LCSW Phone Number: 02/11/2023, 3:56 PM   Clinical Narrative:   Patient received insurance authorization for SNF, and CSW confirmed with [facility] that they can admit patient. CSW spoke with son, Glenn, he is in agreement. Transport arranged with [facility] for next available.  Nurse to call report to [phone]     Final next level of care: Skilled Nursing Facility Barriers to Discharge: Barriers Resolved   Patient Goals and CMS Choice Patient states their goals for this hospitalization and ongoing recovery are:: patient unable to participate in goal setting, not oriented CMS Medicare.gov Compare Post Acute Care list provided to:: Patient Represenative (must comment) Choice offered to / list presented to : Adult Children Stacy ownership interest in [facility]provided to:: Adult Children    Discharge Placement              Patient chooses bed at: [facility] Patient to be transferred to facility by: [facility] Name of family member notified: Welcome Patient and family notified of of transfer: 02/11/23  Discharge Plan and Services Additional resources added to the After Visit Summary for       Post Acute Care Choice: Skilled Nursing Facility                               Social Drivers of Health (SDOH) Interventions SDOH Screenings   Food Insecurity: No Food Insecurity (01/31/2023)  Housing: Low Risk  (02/04/2023)  Transportation Needs: No Transportation Needs (01/31/2023)  Utilities: Not At Risk (01/31/2023)  Alcohol Screen: Low Risk  (08/03/2022)  Depression (PHQ2-9): Low Risk  (10/23/2022)  Financial Resource Strain: Low Risk  (08/03/2022)  Physical Activity: Insufficiently Active (08/03/2022)  Social Connections: Socially Isolated (08/03/2022)  Stress: No  Stress Concern Present (08/03/2022)  Tobacco Use: Medium Risk (01/31/2023)     Readmission Risk Interventions     No data to display

## 2023-02-15 DIAGNOSIS — R4701 Aphasia: Secondary | ICD-10-CM | POA: Diagnosis not present

## 2023-02-15 DIAGNOSIS — I6322 Cerebral infarction due to unspecified occlusion or stenosis of basilar arteries: Secondary | ICD-10-CM | POA: Diagnosis not present

## 2023-02-15 DIAGNOSIS — I10 Essential (primary) hypertension: Secondary | ICD-10-CM | POA: Diagnosis not present

## 2023-02-15 DIAGNOSIS — E785 Hyperlipidemia, unspecified: Secondary | ICD-10-CM | POA: Diagnosis not present

## 2023-02-15 DIAGNOSIS — E559 Vitamin D deficiency, unspecified: Secondary | ICD-10-CM | POA: Diagnosis not present

## 2023-02-15 DIAGNOSIS — R410 Disorientation, unspecified: Secondary | ICD-10-CM | POA: Diagnosis not present

## 2023-02-15 DIAGNOSIS — I609 Nontraumatic subarachnoid hemorrhage, unspecified: Secondary | ICD-10-CM | POA: Diagnosis not present

## 2023-02-15 DIAGNOSIS — R7989 Other specified abnormal findings of blood chemistry: Secondary | ICD-10-CM | POA: Diagnosis not present

## 2023-02-18 DIAGNOSIS — Z79899 Other long term (current) drug therapy: Secondary | ICD-10-CM | POA: Diagnosis not present

## 2023-02-18 DIAGNOSIS — E039 Hypothyroidism, unspecified: Secondary | ICD-10-CM | POA: Diagnosis not present

## 2023-02-18 DIAGNOSIS — N319 Neuromuscular dysfunction of bladder, unspecified: Secondary | ICD-10-CM | POA: Diagnosis not present

## 2023-02-18 DIAGNOSIS — I6322 Cerebral infarction due to unspecified occlusion or stenosis of basilar arteries: Secondary | ICD-10-CM | POA: Diagnosis not present

## 2023-02-18 DIAGNOSIS — I10 Essential (primary) hypertension: Secondary | ICD-10-CM | POA: Diagnosis not present

## 2023-02-22 DIAGNOSIS — N390 Urinary tract infection, site not specified: Secondary | ICD-10-CM | POA: Diagnosis not present

## 2023-02-22 DIAGNOSIS — I6322 Cerebral infarction due to unspecified occlusion or stenosis of basilar arteries: Secondary | ICD-10-CM | POA: Diagnosis not present

## 2023-02-22 DIAGNOSIS — N319 Neuromuscular dysfunction of bladder, unspecified: Secondary | ICD-10-CM | POA: Diagnosis not present

## 2023-02-22 DIAGNOSIS — Z79899 Other long term (current) drug therapy: Secondary | ICD-10-CM | POA: Diagnosis not present

## 2023-02-22 NOTE — Progress Notes (Signed)
 Name: Grant Berry DOB: [birth year] MRN: [id number]  History of Present Illness: Grant Berry is a 77 y.o. male who presents today as a new patient at [facility]. All available relevant medical records have been reviewed. 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 Today he is accompanied by [name], CNA who assists with giving history; patient is a poor historian today.  He presents today for chief complaint of urinary retention.  Recent history:  > 01/31/2023 - 02/11/2023: Admitted for stroke. Discharged to SNF.  > Per SNF: Voiding trial was attempted during first 24 hours there but patient was unable to void. Foley catheter replaced on 02/12/2023.  Today: He denies history of urinary retention prior to current episode.  He doesn't know if he's had any gross hematuria.  He denies flank pain or abdominal pain. He is currently non-ambulatory; requires 2 person lift assist.   Fall Screening: Do you usually have a device to assist in your mobility? Yes   Medications: Current Outpatient Medications  Medication Sig Dispense Refill   AMBULATORY NON FORMULARY MEDICATION Staff at [facility] are instructed to exchange patient's indwelling Foley catheter at least once every 4 weeks or more often if needed should catheter become clogged / stop draining adequately. 1 each PRN   aspirin  EC 81 MG tablet Take 1 tablet (81 mg total) by mouth daily. Swallow whole.     B Complex-C (B-COMPLEX WITH VITAMIN C) tablet Take 1 tablet by mouth daily. 90 tablet 1   clopidogrel  (PLAVIX ) 75 MG tablet Take 1 tablet (75 mg total) by mouth daily.     ezetimibe  (ZETIA ) 10 MG tablet Take 10 mg by mouth daily.     MAGNESIUM PO Take 1 tablet by mouth in the morning and at bedtime.     Omega-3 Fatty Acids (FISH OIL PO) Take 1 capsule by mouth daily.     QUEtiapine  (SEROQUEL ) 25 MG tablet Take 1 tablet (25 mg total) by  mouth daily after breakfast.     QUEtiapine  (SEROQUEL ) 50 MG tablet Take 1 tablet (50 mg total) by mouth at bedtime.     tamsulosin  (FLOMAX ) 0.4 MG CAPS capsule Take 1 capsule (0.4 mg total) by mouth daily. 30 capsule 11   No current facility-administered medications for this visit.    Allergies: Allergies  Allergen Reactions   Lisinopril      Dizziness   Atorvastatin  Other (See Comments)    Feels like crap, muscle pain, & dizziness.   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   Hyperlipidemia    Hypertension    Past Surgical History:  Procedure Laterality Date   EYE SURGERY Bilateral    IR CT HEAD LTD  01/31/2023   IR INTRA CRAN STENT  01/31/2023   IR INTRAVSC STENT CERV CAROTID W/O EMB-PROT MOD SED INC ANGIO  01/31/2023   IR PERCUTANEOUS ART THROMBECTOMY/INFUSION INTRACRANIAL INC DIAG ANGIO  01/31/2023   RADIOLOGY WITH ANESTHESIA N/A 01/31/2023   Procedure: IR WITH ANESTHESIA;  Surgeon: Radiologist, Medication, MD;  Location: [facility] OR;  Service: Radiology;  Laterality: N/A;   TONSILECTOMY/ADENOIDECTOMY WITH MYRINGOTOMY     Family History  Problem Relation Age of Onset   Stroke Mother    Coronary artery disease Mother    Coronary artery disease Father    Diabetes Maternal Grandfather    Social History  Socioeconomic History   Marital status: Widowed    Spouse name: Not on file   Number of children: 1   Years of education: Not on file   Highest education level: Not on file  Occupational History   Occupation: retired   Tobacco Use   Smoking status: Former    Current packs/day: 0.00    Types: Cigarettes    Quit date: 05/24/1988    Years since quitting: 34.7   Smokeless tobacco: Never  Vaping Use   Vaping status: Never Used  Substance and Sexual Activity   Alcohol use: No   Drug use: No   Sexual activity: Not on file  Other Topics Concern   Not on file  Social History Narrative   Live  at home = son stays there    Right Handed   Drinks 1-2 cups of caffeine/daily   Stroke in 2021, 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 and has to use a walker   Social Drivers of Health   Financial Resource Strain: Low Risk  (08/03/2022)   Overall Financial Resource Strain (CARDIA)    Difficulty of Paying Living Expenses: Not hard at all  Food Insecurity: No Food Insecurity (01/31/2023)   Hunger Vital Sign    Worried About Running Out of Food in the Last Year: Never true    Ran Out of Food in the Last Year: Never true  Transportation Needs: No Transportation Needs (01/31/2023)   PRAPARE - Administrator, Civil Service (Medical): No    Lack of Transportation (Non-Medical): No  Physical Activity: Insufficiently Active (08/03/2022)   Exercise Vital Sign    Days of Exercise per Week: 3 days    Minutes of Exercise per Session: 30 min  Stress: No Stress Concern Present (08/03/2022)   [employer] of Occupational Health - Occupational Stress Questionnaire    Feeling of Stress : Not at all  Social Connections: Socially Isolated (08/03/2022)   Social Connection and Isolation Panel [NHANES]    Frequency of Communication with Friends and Family: More than three times a week    Frequency of Social Gatherings with Friends and Family: More than three times a week    Attends Religious Services: Never    Database Administrator or Organizations: No    Attends Banker Meetings: Never    Marital Status: Widowed  Intimate Partner Violence: Not At Risk (01/31/2023)   Humiliation, Afraid, Rape, and Kick questionnaire    Fear of Current or Ex-Partner: No    Emotionally Abused: No    Physically Abused: No    Sexually Abused: No    SUBJECTIVE  Review of Systems - limited due to patient cognitive ability Eyes: Patient fixated about visual sensitivity to bright lights during visit  GU: As per HPI.  OBJECTIVE Vitals:   02/26/23 0836  BP: 121/79  Pulse: 75  Temp: (!) 97.5 F  (36.4 C)   There is no height or weight on file to calculate BMI.  Physical Examination Constitutional: No obvious distress; patient is non-toxic appearing  Cardiovascular: No visible lower extremity edema.  Respiratory: The patient does not have audible wheezing/stridor; respirations do not appear labored  Gastrointestinal: Abdomen non-distended Musculoskeletal: Wheelchair bound  Skin: No obvious rashes/open sores  Neurologic: CN 2-12 grossly intact Psychiatric: Patient unable to provide detailed history  Hematologic/Lymphatic/Immunologic: No obvious bruises or sites of spontaneous bleeding  GU: Catheter draining clear yellow urine.   ASSESSMENT Urinary retention - Plan: tamsulosin  (FLOMAX ) 0.4 MG CAPS capsule,  AMBULATORY NON FORMULARY MEDICATION, CANCELED: Bladder Voiding Trial  Foley catheter in place - Plan: AMBULATORY NON FORMULARY MEDICATION, CANCELED: Bladder Voiding Trial  History of cerebrovascular accident (CVA) with residual deficit - Plan: CANCELED: Bladder Voiding Trial  Neurogenic bladder - Plan: tamsulosin  (FLOMAX ) 0.4 MG CAPS capsule, AMBULATORY NON FORMULARY MEDICATION  Ambulatory dysfunction  We discussed pt's urinary retention and possible etiologies including temporary detrusor areflexia, neurogenic bladder, BPH, constipation, anticholinergic medication use.   We agreed to hold off on voiding trial today - discussed concerns for repeat acute urinary retention, falls, overflow incontinence, skin breakdown, kidney injury, UTIs, etc. His ambulatory dysfunction and apparent cognitive impairment at this time would make volitional voiding difficult.   Advised Foley catheter exchange at least once every 4 weeks, which CNA confirmed can be done at his SNF. Will plan for follow up with Urology in 3 months; hopefully by then he will be further along in his stroke recovery and a voiding trial may be performed.   Start Tamsulosin  (Flomax ) 0.4 mg daily.   Constipation  management with stool softener and/or laxative as needed.    PLAN Advised the following: Start Tamsulosin  (Flomax ) 0.4 mg daily.  Foley catheter to remain in place.  Staff at [facility] are instructed to exchange patient's indwelling Foley catheter at least once every 4 weeks or more often if needed should catheter become clogged / stop draining adequately.  Constipation management with stool softener and/or laxative PRN.  Return in about 3 months (around 05/27/2023) for f/u with Lauraine Oz, NP - possible voiding trial.  No orders of the defined types were placed in this encounter.   It has been explained that the patient is to follow regularly with their PCP in addition to all other providers involved in their care and to follow instructions provided by these respective offices. Patient advised to contact urology clinic if any urologic-pertaining questions, concerns, new symptoms or problems arise in the interim period.  There are no Patient Instructions on file for this visit.  Electronically signed by:  Lauraine KYM Oz, MSN, FNP-C, CUNP 02/26/2023 9:16 AM

## 2023-02-25 DIAGNOSIS — R52 Pain, unspecified: Secondary | ICD-10-CM | POA: Diagnosis not present

## 2023-02-25 DIAGNOSIS — Z79899 Other long term (current) drug therapy: Secondary | ICD-10-CM | POA: Diagnosis not present

## 2023-02-25 DIAGNOSIS — M1991 Primary osteoarthritis, unspecified site: Secondary | ICD-10-CM | POA: Diagnosis not present

## 2023-02-25 DIAGNOSIS — I6322 Cerebral infarction due to unspecified occlusion or stenosis of basilar arteries: Secondary | ICD-10-CM | POA: Diagnosis not present

## 2023-02-25 DIAGNOSIS — Z7189 Other specified counseling: Secondary | ICD-10-CM | POA: Diagnosis not present

## 2023-02-25 DIAGNOSIS — I10 Essential (primary) hypertension: Secondary | ICD-10-CM | POA: Diagnosis not present

## 2023-02-25 DIAGNOSIS — H04123 Dry eye syndrome of bilateral lacrimal glands: Secondary | ICD-10-CM | POA: Diagnosis not present

## 2023-02-26 ENCOUNTER — Encounter: Payer: Self-pay | Admitting: Urology

## 2023-02-26 ENCOUNTER — Ambulatory Visit (INDEPENDENT_AMBULATORY_CARE_PROVIDER_SITE_OTHER): Payer: Medicare Other | Admitting: Urology

## 2023-02-26 VITALS — BP 121/79 | HR 75 | Temp 97.5°F

## 2023-02-26 DIAGNOSIS — M1991 Primary osteoarthritis, unspecified site: Secondary | ICD-10-CM | POA: Diagnosis not present

## 2023-02-26 DIAGNOSIS — N319 Neuromuscular dysfunction of bladder, unspecified: Secondary | ICD-10-CM | POA: Diagnosis not present

## 2023-02-26 DIAGNOSIS — E039 Hypothyroidism, unspecified: Secondary | ICD-10-CM | POA: Diagnosis not present

## 2023-02-26 DIAGNOSIS — R4701 Aphasia: Secondary | ICD-10-CM | POA: Diagnosis not present

## 2023-02-26 DIAGNOSIS — Z978 Presence of other specified devices: Secondary | ICD-10-CM | POA: Insufficient documentation

## 2023-02-26 DIAGNOSIS — I10 Essential (primary) hypertension: Secondary | ICD-10-CM | POA: Diagnosis not present

## 2023-02-26 DIAGNOSIS — H53461 Homonymous bilateral field defects, right side: Secondary | ICD-10-CM | POA: Diagnosis not present

## 2023-02-26 DIAGNOSIS — N390 Urinary tract infection, site not specified: Secondary | ICD-10-CM | POA: Diagnosis not present

## 2023-02-26 DIAGNOSIS — E785 Hyperlipidemia, unspecified: Secondary | ICD-10-CM | POA: Diagnosis not present

## 2023-02-26 DIAGNOSIS — R262 Difficulty in walking, not elsewhere classified: Secondary | ICD-10-CM

## 2023-02-26 DIAGNOSIS — R339 Retention of urine, unspecified: Secondary | ICD-10-CM | POA: Diagnosis not present

## 2023-02-26 DIAGNOSIS — I6322 Cerebral infarction due to unspecified occlusion or stenosis of basilar arteries: Secondary | ICD-10-CM | POA: Diagnosis not present

## 2023-02-26 DIAGNOSIS — I6930 Unspecified sequelae of cerebral infarction: Secondary | ICD-10-CM

## 2023-02-26 MED ORDER — TAMSULOSIN HCL 0.4 MG PO CAPS: 0.4000 mg | ORAL_CAPSULE | Freq: Every day | ORAL | 11 refills | Status: DC

## 2023-02-26 MED ORDER — AMBULATORY NON FORMULARY MEDICATION: 99 refills | Status: AC

## 2023-02-26 NOTE — Progress Notes (Signed)
Open in error

## 2023-03-02 DIAGNOSIS — Z79899 Other long term (current) drug therapy: Secondary | ICD-10-CM | POA: Diagnosis not present

## 2023-03-02 DIAGNOSIS — R52 Pain, unspecified: Secondary | ICD-10-CM | POA: Diagnosis not present

## 2023-03-02 DIAGNOSIS — G629 Polyneuropathy, unspecified: Secondary | ICD-10-CM | POA: Diagnosis not present

## 2023-03-02 DIAGNOSIS — R339 Retention of urine, unspecified: Secondary | ICD-10-CM | POA: Diagnosis not present

## 2023-03-02 DIAGNOSIS — N319 Neuromuscular dysfunction of bladder, unspecified: Secondary | ICD-10-CM | POA: Diagnosis not present

## 2023-03-09 DIAGNOSIS — G629 Polyneuropathy, unspecified: Secondary | ICD-10-CM | POA: Diagnosis not present

## 2023-03-09 DIAGNOSIS — R52 Pain, unspecified: Secondary | ICD-10-CM | POA: Diagnosis not present

## 2023-03-09 DIAGNOSIS — Z79899 Other long term (current) drug therapy: Secondary | ICD-10-CM | POA: Diagnosis not present

## 2023-03-29 DIAGNOSIS — E039 Hypothyroidism, unspecified: Secondary | ICD-10-CM | POA: Diagnosis not present

## 2023-04-05 DIAGNOSIS — N390 Urinary tract infection, site not specified: Secondary | ICD-10-CM | POA: Diagnosis not present

## 2023-04-06 DIAGNOSIS — R52 Pain, unspecified: Secondary | ICD-10-CM | POA: Diagnosis not present

## 2023-04-06 DIAGNOSIS — Z79899 Other long term (current) drug therapy: Secondary | ICD-10-CM | POA: Diagnosis not present

## 2023-04-06 DIAGNOSIS — G629 Polyneuropathy, unspecified: Secondary | ICD-10-CM | POA: Diagnosis not present

## 2023-04-07 DIAGNOSIS — I6322 Cerebral infarction due to unspecified occlusion or stenosis of basilar arteries: Secondary | ICD-10-CM | POA: Diagnosis not present

## 2023-04-07 DIAGNOSIS — N319 Neuromuscular dysfunction of bladder, unspecified: Secondary | ICD-10-CM | POA: Diagnosis not present

## 2023-04-07 DIAGNOSIS — I10 Essential (primary) hypertension: Secondary | ICD-10-CM | POA: Diagnosis not present

## 2023-04-07 DIAGNOSIS — E039 Hypothyroidism, unspecified: Secondary | ICD-10-CM | POA: Diagnosis not present

## 2023-04-07 DIAGNOSIS — R339 Retention of urine, unspecified: Secondary | ICD-10-CM | POA: Diagnosis not present

## 2023-04-07 DIAGNOSIS — E785 Hyperlipidemia, unspecified: Secondary | ICD-10-CM | POA: Diagnosis not present

## 2023-04-07 DIAGNOSIS — R4701 Aphasia: Secondary | ICD-10-CM | POA: Diagnosis not present

## 2023-04-07 DIAGNOSIS — N390 Urinary tract infection, site not specified: Secondary | ICD-10-CM | POA: Diagnosis not present

## 2023-04-07 DIAGNOSIS — M1991 Primary osteoarthritis, unspecified site: Secondary | ICD-10-CM | POA: Diagnosis not present

## 2023-04-07 DIAGNOSIS — H53461 Homonymous bilateral field defects, right side: Secondary | ICD-10-CM | POA: Diagnosis not present

## 2023-04-12 DIAGNOSIS — S3983XD Other specified injuries of pelvis, subsequent encounter: Secondary | ICD-10-CM | POA: Diagnosis not present

## 2023-04-12 DIAGNOSIS — R52 Pain, unspecified: Secondary | ICD-10-CM | POA: Diagnosis not present

## 2023-04-12 DIAGNOSIS — Z79899 Other long term (current) drug therapy: Secondary | ICD-10-CM | POA: Diagnosis not present

## 2023-04-19 ENCOUNTER — Ambulatory Visit: Payer: Medicare Other | Admitting: Urology

## 2023-04-19 ENCOUNTER — Telehealth: Payer: Self-pay

## 2023-04-19 NOTE — Telephone Encounter (Signed)
 Tried calling patient with no answer to rescheduled appointment. Left vm for return call to office.

## 2023-04-23 ENCOUNTER — Encounter: Payer: Medicare Other | Admitting: Family Medicine

## 2023-04-26 DIAGNOSIS — M6281 Muscle weakness (generalized): Secondary | ICD-10-CM | POA: Diagnosis not present

## 2023-04-26 DIAGNOSIS — I69398 Other sequelae of cerebral infarction: Secondary | ICD-10-CM | POA: Diagnosis not present

## 2023-04-26 DIAGNOSIS — R2689 Other abnormalities of gait and mobility: Secondary | ICD-10-CM | POA: Diagnosis not present

## 2023-04-27 DIAGNOSIS — I69398 Other sequelae of cerebral infarction: Secondary | ICD-10-CM | POA: Diagnosis not present

## 2023-04-27 DIAGNOSIS — R2689 Other abnormalities of gait and mobility: Secondary | ICD-10-CM | POA: Diagnosis not present

## 2023-04-27 DIAGNOSIS — M6281 Muscle weakness (generalized): Secondary | ICD-10-CM | POA: Diagnosis not present

## 2023-04-28 DIAGNOSIS — M6281 Muscle weakness (generalized): Secondary | ICD-10-CM | POA: Diagnosis not present

## 2023-04-28 DIAGNOSIS — R2689 Other abnormalities of gait and mobility: Secondary | ICD-10-CM | POA: Diagnosis not present

## 2023-04-28 DIAGNOSIS — I69398 Other sequelae of cerebral infarction: Secondary | ICD-10-CM | POA: Diagnosis not present

## 2023-04-29 ENCOUNTER — Ambulatory Visit: Payer: Medicare Other | Admitting: Neurology

## 2023-04-29 ENCOUNTER — Encounter: Payer: Self-pay | Admitting: Neurology

## 2023-04-29 VITALS — BP 116/74 | HR 89 | Ht 67.0 in | Wt 128.0 lb

## 2023-04-29 DIAGNOSIS — Z8673 Personal history of transient ischemic attack (TIA), and cerebral infarction without residual deficits: Secondary | ICD-10-CM

## 2023-04-29 DIAGNOSIS — I2510 Atherosclerotic heart disease of native coronary artery without angina pectoris: Secondary | ICD-10-CM | POA: Diagnosis not present

## 2023-04-29 DIAGNOSIS — M6281 Muscle weakness (generalized): Secondary | ICD-10-CM | POA: Diagnosis not present

## 2023-04-29 DIAGNOSIS — I6522 Occlusion and stenosis of left carotid artery: Secondary | ICD-10-CM

## 2023-04-29 DIAGNOSIS — R251 Tremor, unspecified: Secondary | ICD-10-CM

## 2023-04-29 DIAGNOSIS — I69398 Other sequelae of cerebral infarction: Secondary | ICD-10-CM | POA: Diagnosis not present

## 2023-04-29 DIAGNOSIS — R2689 Other abnormalities of gait and mobility: Secondary | ICD-10-CM | POA: Diagnosis not present

## 2023-04-29 DIAGNOSIS — Z79899 Other long term (current) drug therapy: Secondary | ICD-10-CM | POA: Diagnosis not present

## 2023-04-29 DIAGNOSIS — E785 Hyperlipidemia, unspecified: Secondary | ICD-10-CM | POA: Diagnosis not present

## 2023-04-29 DIAGNOSIS — I6322 Cerebral infarction due to unspecified occlusion or stenosis of basilar arteries: Secondary | ICD-10-CM | POA: Diagnosis not present

## 2023-04-29 DIAGNOSIS — I10 Essential (primary) hypertension: Secondary | ICD-10-CM | POA: Diagnosis not present

## 2023-04-29 NOTE — Progress Notes (Signed)
 [facility] [address] [place]. [place] [address] [phone]       OFFICE FOLLOW-UP NOTE  [name] Date of Birth:  10/14/1945 Medical Record Number:  [id number]   HPI: Grant Berry is a 78 year old Caucasian male seen today for office follow-up visit following hospital consultation for stroke in December 2024.  Patient is accompanied by his caregiver from skilled nursing facility where he is staying.  History is obtained from them and review of electronic medical records and I personally reviewed pertinent available imaging films in PACS.  He has past medical history of hyperlipidemia, hypertension, prior left MCA stroke with history of multiple falls.  He was seen on 01/31/2023 initially by teleneurology as code stroke and Grant Berry, ED when he fell 3 times bumping into objects that day.  CT head without contrast showed old left cerebellar infarct and chronic right PCA infarct.  There is also noted a subacute appearing left PCA infarct.  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  Since [facility] did not have ability to do CT perfusion at that time he was transferred emergently to [facility] to get an MRI to be evaluated for whether he qualified for acute endovascular intervention or not.  CT perfusion showed no core infarct and increase time to perfusion of posterior circulation and in the left MCA territory.  He underwent emergent left terminal carotid artery angioplasty and stenting with  TICI 3 reperfusion MRI scan did demonstrate acute left PCA infarct on diffusion weighted imaging but with a matched T2 flair hyperintensity    NIH stroke scale was 5 on admission.  Postprocedure CT scan showed a progressive left PCA infarct with hyperdense material in the left frontal sulci suggesting subarachnoid hemorrhage.  Follow-up MRI scan showed moderate size acute left occipital PCA  infarct progressed from previous 1 with stable subarachnoid hemorrhage.  Echo showed ejection fraction of 60 to 65%.  LDL cholesterol 167 mg percent.  Hemoglobin A1c was 5.1.  Patient was started on dual antiplatelet therapy aspirin and Plavix due to intracranial stenting.  He was transferred to skilled nursing facility for rehabilitation needs.  Patient states he is progressing with therapy.  He is able to stand holding onto the physical therapist but is not been able to walk yet.  He spends most of his time in the wheelchair.  He still has right-sided vision loss which is quite poor.  His left upper extremity at times can jog but this week.  He has also noticed some tremulous lessening left upper extremity but it is not bothersome and is chronic and was present prior to his stroke.  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   Hyperlipidemia    Hypertension     Social History:  Social History   Socioeconomic History   Marital status: Widowed    Spouse name: Not on file   Number of children: 1   Years of education: Not on file   Highest education level: Not on file  Occupational History   Occupation: retired   Tobacco Use   Smoking status: Former    Current packs/day: 0.00    Types: Cigarettes    Quit date: 05/24/1988  Years since quitting: 34.9   Smokeless tobacco: Never  Vaping Use   Vaping status: Never Used  Substance and Sexual Activity   Alcohol use: No   Drug use: No   Sexual activity: Not on file  Other Topics Concern   Not on file  Social History Narrative   Live at home = son stays there    Right Handed   Drinks 1-2 cups of caffeine/daily   Stroke in 2021, 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 and has to use a walker   Social Drivers of Health   Financial Resource Strain:  Low Risk  (08/03/2022)   Overall Financial Resource Strain (CARDIA)    Difficulty of Paying Living Expenses: Not hard at all  Food Insecurity: No Food Insecurity (01/31/2023)   Hunger Vital Sign    Worried About Running Out of Food in the Last Year: Never true    Ran Out of Food in the Last Year: Never true  Transportation Needs: No Transportation Needs (01/31/2023)   PRAPARE - Administrator, Civil Service (Medical): No    Lack of Transportation (Non-Medical): No  Physical Activity: Insufficiently Active (08/03/2022)   Exercise Vital Sign    Days of Exercise per Week: 3 days    Minutes of Exercise per Session: 30 min  Stress: No Stress Concern Present (08/03/2022)   [facility] Berry of Occupational Health - Occupational Stress Questionnaire    Feeling of Stress : Not at all  Social Connections: Socially Isolated (08/03/2022)   Social Connection and Isolation Panel [NHANES]    Frequency of Communication with Friends and Family: More than three times a week    Frequency of Social Gatherings with Friends and Family: More than three times a week    Attends Religious Services: Never    Database administrator or Organizations: No    Attends Banker Meetings: Never    Marital Status: Widowed  Intimate Partner Violence: Not At Risk (01/31/2023)   Humiliation, Afraid, Rape, and Kick questionnaire    Fear of Current or Ex-Partner: No    Emotionally Abused: No    Physically Abused: No    Sexually Abused: No    Medications:   Current Outpatient Medications on File Prior to Visit  Medication Sig Dispense Refill   acetaminophen (TYLENOL) 650 MG CR tablet Take 650 mg by mouth every 8 (eight) hours as needed for pain.     clopidogrel (PLAVIX) 75 MG tablet Take 1 tablet (75 mg total) by mouth daily.     ezetimibe (ZETIA) 10 MG tablet Take 10 mg by mouth daily.     levothyroxine (SYNTHROID) 50 MCG tablet Take 50 mcg by mouth daily before breakfast.     MAGNESIUM PO Take 1  tablet by mouth in the morning and at bedtime.     Omega-3 Fatty Acids (FISH OIL PO) Take 1 capsule by mouth daily.     ondansetron (ZOFRAN) 4 MG tablet Take 4 mg by mouth every 6 (six) hours as needed.     pregabalin (LYRICA) 50 MG capsule Take 50 mg by mouth 3 (three) times daily.     senna-docusate (SENOKOT-S) 8.6-50 MG tablet Take 1 tablet by mouth daily.     sertraline (ZOLOFT) 50 MG tablet Take 50 mg by mouth daily.     tamsulosin (FLOMAX) 0.4 MG CAPS capsule Take 1 capsule (0.4 mg total) by mouth daily. 30 capsule 11   AMBULATORY NON FORMULARY MEDICATION Staff at [facility] are  instructed to exchange patient's indwelling Foley catheter at least once every 4 weeks or more often if needed should catheter become clogged / stop draining adequately. (Patient not taking: Reported on 04/29/2023) 1 each PRN   aspirin EC 81 MG tablet Take 1 tablet (81 mg total) by mouth daily. Swallow whole. (Patient not taking: Reported on 04/29/2023)     B Complex-C (B-COMPLEX WITH VITAMIN C) tablet Take 1 tablet by mouth daily. (Patient not taking: Reported on 04/29/2023) 90 tablet 1   QUEtiapine (SEROQUEL) 25 MG tablet Take 1 tablet (25 mg total) by mouth daily after breakfast. (Patient not taking: Reported on 04/29/2023)     QUEtiapine (SEROQUEL) 50 MG tablet Take 1 tablet (50 mg total) by mouth at bedtime. (Patient not taking: Reported on 04/29/2023)     No current facility-administered medications on file prior to visit.    Allergies:   Allergies  Allergen Reactions   Lisinopril     Dizziness   Atorvastatin Other (See Comments)    "Feels like crap", muscle pain, & dizziness.   Pravastatin Other (See Comments)    Pain    Physical Exam General: well developed, well nourished, seated, in no evident distress Head: head normocephalic and atraumatic.  Neck: supple with no carotid or supraclavicular bruits Cardiovascular: regular rate and rhythm, no murmurs Musculoskeletal: no deformity Skin:  no  rash/petichiae Vascular:  Normal pulses all extremities Vitals:   04/29/23 0923  BP: 116/74  Pulse: 89   Neurologic Exam Mental Status: Awake and fully alert. Oriented to place and time. Recent and remote memory intact. Attention span, concentration and fund of knowledge appropriate. Mood and affect appropriate.  Cranial Nerves: Fundoscopic exam reveals sharp disc margins. Pupils equal, briskly reactive to light. Extraocular movements full without nystagmus. Visual fields full show dense right homonymous hemianopsia confrontation. Hearing intact. Facial sensation intact. Face, tongue, palate moves normally and symmetrically.  Motor: Normal bulk and tone. Normal strength in all tested extremity muscles.  Mild left hand weakness with diminished grip.  Intermittent action tremor of the left upper extremity which is absent at rest. Sensory.:  Mild diminished right hemibody sensation to touch ,pinprick .position and vibratory sensation.  Coordination: Rapid alternating movements normal in all extremities. Finger-to-nose and heel-to-shin performed accurately bilaterally. Gait and Station: Deferred as patient is unable to walk at baseline due to stroke deficits  reflexes: 1+ and symmetric. Toes downgoing.   NIHSS  3 Modified Rankin  4   ASSESSMENT: 78 year old Caucasian male with left MCA and PCA infarcts in December 2020.  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  Vascular risk factors of intracranial stenosis, hyperlipidemia and prior stroke he has significant residual gait and balance difficulties and pre-existing left upper extremity tremor from his previous stroke.     PLAN:I had a long d/w patient and his caregiver about his recent stroke, carotid occlusion, poststroke tremor, risk for recurrent stroke/TIAs, personally independently reviewed imaging studies and stroke evaluation results and answered  questions.Continue aspirin 81 mg daily  for secondary stroke prevention and maintain strict control of hypertension with blood pressure goal below 130/90, diabetes with hemoglobin A1c goal below 6.5% and lipids with LDL cholesterol goal below 70 mg/dL. I also advised the patient to eat a healthy diet with plenty of whole grains, cereals, fruits and vegetables, exercise regularly and maintain ideal body weight .Continue ongoing Physical and Occupational Therapy.  Followup in the future with my nurse practitioner only as needed.  Greater than 50% of time during this 35 minute visit was spent on counseling,explanation of diagnosis stroke and symptomatic carotid stenosis, planning of further management, discussion with patient and family and coordination of care Delia Heady, MD Note: This document was prepared with digital dictation and possible smart phrase technology. Any transcriptional errors that result from this process are unintentional

## 2023-04-29 NOTE — Patient Instructions (Signed)
 I had a long d/w patient and his caregiver about his recent stroke, carotid occlusion, poststroke tremor, risk for recurrent stroke/TIAs, personally independently reviewed imaging studies and stroke evaluation results and answered questions.Continue aspirin 81 mg daily  for secondary stroke prevention and maintain strict control of hypertension with blood pressure goal below 130/90, diabetes with hemoglobin A1c goal below 6.5% and lipids with LDL cholesterol goal below 70 mg/dL. I also advised the patient to eat a healthy diet with plenty of whole grains, cereals, fruits and vegetables, exercise regularly and maintain ideal body weight .Continue ongoing Physical and Occupational Therapy.  Followup in the future with my nurse practitioner only as needed.  Stroke Prevention Some medical conditions and behaviors can lead to a higher chance of having a stroke. You can help prevent a stroke by eating healthy, exercising, not smoking, and managing any medical conditions you have. Stroke is a leading cause of functional impairment. Primary prevention is particularly important because a majority of strokes are first-time events. Stroke changes the lives of not only those who experience a stroke but also their family and other caregivers. How can this condition affect me? A stroke is a medical emergency and should be treated right away. A stroke can lead to brain damage and can sometimes be life-threatening. If a person gets medical treatment right away, there is a better chance of surviving and recovering from a stroke. What can increase my risk? The following medical conditions may increase your risk of a stroke: Cardiovascular disease. High blood pressure (hypertension). Diabetes. High cholesterol. Sickle cell disease. Blood clotting disorders (hypercoagulable state). Obesity. Sleep disorders (obstructive sleep apnea). Other risk factors include: Being older than age 42. Having a history of blood clots,  stroke, or mini-stroke (transient ischemic attack, TIA). Genetic factors, such as race, ethnicity, or a family history of stroke. Smoking cigarettes or using other tobacco products. Taking birth control pills, especially if you also use tobacco. Heavy use of alcohol or drugs, especially cocaine and methamphetamine. Physical inactivity. What actions can I take to prevent this? Manage your health conditions High cholesterol levels. Eating a healthy diet is important for preventing high cholesterol. If cholesterol cannot be managed through diet alone, you may need to take medicines. Take any prescribed medicines to control your cholesterol as told by your health care provider. Hypertension. To reduce your risk of stroke, try to keep your blood pressure below 130/80. Eating a healthy diet and exercising regularly are important for controlling blood pressure. If these steps are not enough to manage your blood pressure, you may need to take medicines. Take any prescribed medicines to control hypertension as told by your health care provider. Ask your health care provider if you should monitor your blood pressure at home. Have your blood pressure checked every year, even if your blood pressure is normal. Blood pressure increases with age and some medical conditions. Diabetes. Eating a healthy diet and exercising regularly are important parts of managing your blood sugar (glucose). If your blood sugar cannot be managed through diet and exercise, you may need to take medicines. Take any prescribed medicines to control your diabetes as told by your health care provider. Get evaluated for obstructive sleep apnea. Talk to your health care provider about getting a sleep evaluation if you snore a lot or have excessive sleepiness. Make sure that any other medical conditions you have, such as atrial fibrillation or atherosclerosis, are managed. Nutrition Follow instructions from your health care provider  about what to eat or  drink to help manage your health condition. These instructions may include: Reducing your daily calorie intake. Limiting how much salt (sodium) you use to 1,500 milligrams (mg) each day. Using only healthy fats for cooking, such as olive oil, canola oil, or sunflower oil. Eating healthy foods. You can do this by: Choosing foods that are high in fiber, such as whole grains, and fresh fruits and vegetables. Eating at least 5 servings of fruits and vegetables a day. Try to fill one-half of your plate with fruits and vegetables at each meal. Choosing lean protein foods, such as lean cuts of meat, poultry without skin, fish, tofu, beans, and nuts. Eating low-fat dairy products. Avoiding foods that are high in sodium. This can help lower blood pressure. Avoiding foods that have saturated fat, trans fat, and cholesterol. This can help prevent high cholesterol. Avoiding processed and prepared foods. Counting your daily carbohydrate intake.  Lifestyle If you drink alcohol: Limit how much you have to: 0-1 drink a day for women who are not pregnant. 0-2 drinks a day for men. Know how much alcohol is in your drink. In the [place] one drink equals one 12 oz bottle of beer ( ), one 5 oz glass of wine ( ), or one 1 oz glass of hard liquor (44mL). Do not use any products that contain nicotine or tobacco. These products include cigarettes, chewing tobacco, and vaping devices, such as e-cigarettes. If you need help quitting, ask your health care provider. Avoid secondhand smoke. Do not use drugs. Activity  Try to stay at a healthy weight. Get at least 30 minutes of exercise on most days, such as: Fast walking. Biking. Swimming. Medicines Take over-the-counter and prescription medicines only as told by your health care provider. Aspirin or blood thinners (antiplatelets or anticoagulants) may be recommended to reduce your risk of forming blood clots that can lead to  stroke. Avoid taking birth control pills. Talk to your health care provider about the risks of taking birth control pills if: You are over 2 years old. You smoke. You get very bad headaches. You have had a blood clot. Where to find more information American Stroke Association: www.strokeassociation.org Get help right away if: You or a loved one has any symptoms of a stroke. "BE FAST" is an easy way to remember the main warning signs of a stroke: B - Balance. Signs are dizziness, sudden trouble walking, or loss of balance. E - Eyes. Signs are trouble seeing or a sudden change in vision. F - Face. Signs are sudden weakness or numbness of the face, or the face or eyelid drooping on one side. A - Arms. Signs are weakness or numbness in an arm. This happens suddenly and usually on one side of the body. S - Speech. Signs are sudden trouble speaking, slurred speech, or trouble understanding what people say. T - Time. Time to call emergency services. Write down what time symptoms started. You or a loved one has other signs of a stroke, such as: A sudden, severe headache with no known cause. Nausea or vomiting. Seizure. These symptoms may represent a serious problem that is an emergency. Do not wait to see if the symptoms will go away. Get medical help right away. Call your local emergency services (911 in the [place] Do not drive yourself to the hospital. Summary You can help to prevent a stroke by eating healthy, exercising, not smoking, limiting alcohol intake, and managing any medical conditions you may have. Do not use any products that contain nicotine  or tobacco. These include cigarettes, chewing tobacco, and vaping devices, such as e-cigarettes. If you need help quitting, ask your health care provider. Remember "BE FAST" for warning signs of a stroke. Get help right away if you or a loved one has any of these signs. This information is not intended to replace advice given to you by your  health care provider. Make sure you discuss any questions you have with your health care provider. Document Revised: 01/12/2022 Document Reviewed: 01/12/2022 Elsevier Patient Education  2024 [employer].

## 2023-04-30 DIAGNOSIS — I69398 Other sequelae of cerebral infarction: Secondary | ICD-10-CM | POA: Diagnosis not present

## 2023-04-30 DIAGNOSIS — M6281 Muscle weakness (generalized): Secondary | ICD-10-CM | POA: Diagnosis not present

## 2023-04-30 DIAGNOSIS — R2689 Other abnormalities of gait and mobility: Secondary | ICD-10-CM | POA: Diagnosis not present

## 2023-05-03 DIAGNOSIS — M6281 Muscle weakness (generalized): Secondary | ICD-10-CM | POA: Diagnosis not present

## 2023-05-03 DIAGNOSIS — R2689 Other abnormalities of gait and mobility: Secondary | ICD-10-CM | POA: Diagnosis not present

## 2023-05-03 DIAGNOSIS — I69398 Other sequelae of cerebral infarction: Secondary | ICD-10-CM | POA: Diagnosis not present

## 2023-05-04 DIAGNOSIS — M6281 Muscle weakness (generalized): Secondary | ICD-10-CM | POA: Diagnosis not present

## 2023-05-04 DIAGNOSIS — R2689 Other abnormalities of gait and mobility: Secondary | ICD-10-CM | POA: Diagnosis not present

## 2023-05-04 DIAGNOSIS — I69398 Other sequelae of cerebral infarction: Secondary | ICD-10-CM | POA: Diagnosis not present

## 2023-05-05 DIAGNOSIS — R4701 Aphasia: Secondary | ICD-10-CM | POA: Diagnosis not present

## 2023-05-05 DIAGNOSIS — I69398 Other sequelae of cerebral infarction: Secondary | ICD-10-CM | POA: Diagnosis not present

## 2023-05-05 DIAGNOSIS — M1991 Primary osteoarthritis, unspecified site: Secondary | ICD-10-CM | POA: Diagnosis not present

## 2023-05-05 DIAGNOSIS — I2510 Atherosclerotic heart disease of native coronary artery without angina pectoris: Secondary | ICD-10-CM | POA: Diagnosis not present

## 2023-05-05 DIAGNOSIS — H53461 Homonymous bilateral field defects, right side: Secondary | ICD-10-CM | POA: Diagnosis not present

## 2023-05-05 DIAGNOSIS — I10 Essential (primary) hypertension: Secondary | ICD-10-CM | POA: Diagnosis not present

## 2023-05-05 DIAGNOSIS — E039 Hypothyroidism, unspecified: Secondary | ICD-10-CM | POA: Diagnosis not present

## 2023-05-05 DIAGNOSIS — I6322 Cerebral infarction due to unspecified occlusion or stenosis of basilar arteries: Secondary | ICD-10-CM | POA: Diagnosis not present

## 2023-05-05 DIAGNOSIS — R339 Retention of urine, unspecified: Secondary | ICD-10-CM | POA: Diagnosis not present

## 2023-05-05 DIAGNOSIS — E785 Hyperlipidemia, unspecified: Secondary | ICD-10-CM | POA: Diagnosis not present

## 2023-05-05 DIAGNOSIS — M6281 Muscle weakness (generalized): Secondary | ICD-10-CM | POA: Diagnosis not present

## 2023-05-05 DIAGNOSIS — R2689 Other abnormalities of gait and mobility: Secondary | ICD-10-CM | POA: Diagnosis not present

## 2023-05-05 DIAGNOSIS — N319 Neuromuscular dysfunction of bladder, unspecified: Secondary | ICD-10-CM | POA: Diagnosis not present

## 2023-05-06 DIAGNOSIS — M6281 Muscle weakness (generalized): Secondary | ICD-10-CM | POA: Diagnosis not present

## 2023-05-06 DIAGNOSIS — R2689 Other abnormalities of gait and mobility: Secondary | ICD-10-CM | POA: Diagnosis not present

## 2023-05-06 DIAGNOSIS — I69398 Other sequelae of cerebral infarction: Secondary | ICD-10-CM | POA: Diagnosis not present

## 2023-05-07 DIAGNOSIS — I69398 Other sequelae of cerebral infarction: Secondary | ICD-10-CM | POA: Diagnosis not present

## 2023-05-07 DIAGNOSIS — R2689 Other abnormalities of gait and mobility: Secondary | ICD-10-CM | POA: Diagnosis not present

## 2023-05-07 DIAGNOSIS — M6281 Muscle weakness (generalized): Secondary | ICD-10-CM | POA: Diagnosis not present

## 2023-05-10 DIAGNOSIS — M6281 Muscle weakness (generalized): Secondary | ICD-10-CM | POA: Diagnosis not present

## 2023-05-10 DIAGNOSIS — I69398 Other sequelae of cerebral infarction: Secondary | ICD-10-CM | POA: Diagnosis not present

## 2023-05-10 DIAGNOSIS — Z79899 Other long term (current) drug therapy: Secondary | ICD-10-CM | POA: Diagnosis not present

## 2023-05-10 DIAGNOSIS — G629 Polyneuropathy, unspecified: Secondary | ICD-10-CM | POA: Diagnosis not present

## 2023-05-10 DIAGNOSIS — R2689 Other abnormalities of gait and mobility: Secondary | ICD-10-CM | POA: Diagnosis not present

## 2023-05-10 NOTE — Progress Notes (Signed)
 Name: Grant Berry DOB: 07/21/1945 MRN: [id number]  History of Present Illness: Grant Berry is a 78 y.o. male who presents today for follow up visit at [facility] [place]. He currently resides at [facility]; accompanied by Lyla Son, CMA. Patient was able to give history today.  > GU history includes: 1. Urinary retention.   Recent history:  > 01/31/2023 - 02/11/2023: Admitted for stroke. Discharged to SNF. Voiding trial was attempted during first 24 hours at SNF but patient was unable to void so Foley catheter replaced on 02/12/2023.  At 1st Urology visit on 02/26/2023: - We agreed to hold off on voiding trial due to concerns for repeat acute urinary retention, falls, overflow incontinence, skin breakdown, kidney injury, UTIs, etc. His non-ambulatory status (requiring 2 person lift assist) and apparent cognitive impairment would make attempts at volitional voiding impractical.  - Advised the following: Start Tamsulosin (Flomax) 0.4 mg daily.  Foley catheter to remain in place.  Staff at [facility] are instructed to exchange patient's indwelling Foley catheter at least once every 4 weeks or more often if needed should catheter become clogged / stop draining adequately.  Constipation management with stool softener and/or laxative PRN.  Return in about 3 months (around 05/27/2023) for f/u with Evette Georges, NP.  Today: He reports that he continues to work on rebuilding his strength with PT/OT. He reports the catheter has been draining well and getting exchanged monthly at his SNF. Denies gross hematuria, flank pain, abdominal pain, fevers, nausea, or vomiting.  Per SNF medication list he is taking Flomax (Tamsulosin) 0.4 mg daily.   Medications: Current Outpatient Medications  Medication Sig Dispense Refill   acetaminophen (TYLENOL) 650 MG CR tablet Take 650 mg by mouth every 8 (eight) hours as needed for pain.     AMBULATORY NON FORMULARY MEDICATION Staff at  [facility] are instructed to exchange patient's indwelling Foley catheter at least once every 4 weeks or more often if needed should catheter become clogged / stop draining adequately. 1 each PRN   aspirin EC 81 MG tablet Take 1 tablet (81 mg total) by mouth daily. Swallow whole.     B Complex-C (B-COMPLEX WITH VITAMIN C) tablet Take 1 tablet by mouth daily. 90 tablet 1   clopidogrel (PLAVIX) 75 MG tablet Take 1 tablet (75 mg total) by mouth daily.     ezetimibe (ZETIA) 10 MG tablet Take 10 mg by mouth daily.     levothyroxine (SYNTHROID) 50 MCG tablet Take 50 mcg by mouth daily before breakfast.     MAGNESIUM PO Take 1 tablet by mouth in the morning and at bedtime.     Omega-3 Fatty Acids (FISH OIL PO) Take 1 capsule by mouth daily.     ondansetron (ZOFRAN) 4 MG tablet Take 4 mg by mouth every 6 (six) hours as needed.     pregabalin (LYRICA) 50 MG capsule Take 50 mg by mouth 3 (three) times daily.     QUEtiapine (SEROQUEL) 25 MG tablet Take 1 tablet (25 mg total) by mouth daily after breakfast.     QUEtiapine (SEROQUEL) 50 MG tablet Take 1 tablet (50 mg total) by mouth at bedtime.     senna-docusate (SENOKOT-S) 8.6-50 MG tablet Take 1 tablet by mouth daily.     sertraline (ZOLOFT) 50 MG tablet Take 50 mg by mouth daily.     tamsulosin (FLOMAX) 0.4 MG CAPS capsule Take 1 capsule (0.4 mg total) by mouth daily. 30 capsule 11  No current facility-administered medications for this visit.    Allergies: Allergies  Allergen Reactions   Lisinopril     Dizziness   Atorvastatin Other (See Comments)    "Feels like crap", muscle pain, & dizziness.   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   Hyperlipidemia    Hypertension    Past Surgical History:  Procedure Laterality Date   EYE SURGERY Bilateral    IR CT HEAD LTD  01/31/2023   IR INTRA CRAN STENT  01/31/2023   IR INTRAVSC STENT CERV CAROTID W/O EMB-PROT  MOD SED INC ANGIO  01/31/2023   IR PERCUTANEOUS ART THROMBECTOMY/INFUSION INTRACRANIAL INC DIAG ANGIO  01/31/2023   RADIOLOGY WITH ANESTHESIA N/A 01/31/2023   Procedure: IR WITH ANESTHESIA;  Surgeon: Radiologist, Medication, MD;  Location: [facility] OR;  Service: Radiology;  Laterality: N/A;   TONSILECTOMY/ADENOIDECTOMY WITH MYRINGOTOMY     Family History  Problem Relation Age of Onset   Stroke Mother    Coronary artery disease Mother    Coronary artery disease Father    Diabetes Maternal Grandfather    Social History   Socioeconomic History   Marital status: Widowed    Spouse name: Not on file   Number of children: 1   Years of education: Not on file   Highest education level: Not on file  Occupational History   Occupation: retired   Tobacco Use   Smoking status: Former    Current packs/day: 0.00    Types: Cigarettes    Quit date: 05/24/1988    Years since quitting: 35.0   Smokeless tobacco: Never  Vaping Use   Vaping status: Never Used  Substance and Sexual Activity   Alcohol use: No   Drug use: No   Sexual activity: Not on file  Other Topics Concern   Not on file  Social History Narrative   Live at home = son stays there    Right Handed   Drinks 1-2 cups of caffeine/daily   Stroke in 2021, 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 and has to use a walker   Social Drivers of Health   Financial Resource Strain: Low Risk  (08/03/2022)   Overall Financial Resource Strain (CARDIA)    Difficulty of Paying Living Expenses: Not hard at all  Food Insecurity: No Food Insecurity (01/31/2023)   Hunger Vital Sign    Worried About Running Out of Food in the Last Year: Never true    Ran Out of Food in the Last Year: Never true  Transportation Needs: No Transportation Needs (01/31/2023)   PRAPARE - Administrator, Civil Service (Medical): No    Lack of Transportation (Non-Medical): No  Physical Activity: Insufficiently Active (08/03/2022)   Exercise Vital Sign    Days of Exercise per  Week: 3 days    Minutes of Exercise per Session: 30 min  Stress: No Stress Concern Present (08/03/2022)   [employer] of Occupational Health - Occupational Stress Questionnaire    Feeling of Stress : Not at all  Social Connections: Socially Isolated (08/03/2022)   Social Connection and Isolation Panel [NHANES]    Frequency of Communication with Friends and Family: More than three times a week    Frequency of Social Gatherings with Friends and Family: More than three times a week    Attends Religious Services: Never    Database administrator or Organizations: No    Attends  Club or Organization Meetings: Never    Marital Status: Widowed  Intimate Partner Violence: Not At Risk (01/31/2023)   Humiliation, Afraid, Rape, and Kick questionnaire    Fear of Current or Ex-Partner: No    Emotionally Abused: No    Physically Abused: No    Sexually Abused: No    Review of Systems Cardiovascular: Patient denies chest pain or syncope Respiratory: Patient denies shortness of breath Gastrointestinal: Patient denies constipation Musculoskeletal: Patient reports some increased strength Neurologic: Patient denies convulsions or seizures Hematologic/Lymphatic: Patient denies bleeding tendencies Endocrine: Patient denies heat/cold intolerance  GU: As per HPI.  OBJECTIVE There were no vitals filed for this visit. There is no height or weight on file to calculate BMI.  Physical Examination Constitutional: No obvious distress; patient is non-toxic appearing  Cardiovascular: No visible lower extremity edema.  Respiratory: The patient does not have audible wheezing/stridor; respirations do not appear labored  Gastrointestinal: Abdomen non-distended Musculoskeletal: Right sided weakness s/p stroke Skin: No obvious rashes/open sores  Neurologic: CN 2-12 grossly intact Psychiatric: Answered questions appropriately with normal affect  Hematologic/Lymphatic/Immunologic: No obvious bruises or sites of  spontaneous bleeding  GU: Catheter draining clear yellow urine.  ASSESSMENT Urinary retention  Foley catheter in place  History of cerebrovascular accident (CVA) with residual deficit  Neurogenic bladder  Ambulatory dysfunction  No acute findings today. We discussed potential pros vs. cons and risks vs. benefits of doing a voiding trial either in office or at his SNF; he elected to continue indwelling Foley catheter for now since he is unable to ambulate independently. Advised to continue Flomax (Tamsulosin) 0.4 mg daily and constipation prevention / management. We agreed to plan for follow up in 3 months for recheck or sooner if needed. Patient verbalized understanding of and agreement with current plan. All questions were answered.  PLAN Advised the following: 1. Continue indwelling Foley catheter.  2. Continue Flomax (Tamsulosin) 0.4 mg daily. 3. Return in about 3 months (around 08/14/2023) for f/u with Evette Georges, NP; possible voiding trial (morning appointment).  No orders of the defined types were placed in this encounter.   It has been explained that the patient is to follow regularly with their PCP in addition to all other providers involved in their care and to follow instructions provided by these respective offices. Patient advised to contact urology clinic if any urologic-pertaining questions, concerns, new symptoms or problems arise in the interim period.  There are no Patient Instructions on file for this visit.  Electronically signed by:  Donnita Falls, FNP   05/16/23    10:12 AM

## 2023-05-11 DIAGNOSIS — R2689 Other abnormalities of gait and mobility: Secondary | ICD-10-CM | POA: Diagnosis not present

## 2023-05-11 DIAGNOSIS — I69398 Other sequelae of cerebral infarction: Secondary | ICD-10-CM | POA: Diagnosis not present

## 2023-05-11 DIAGNOSIS — M6281 Muscle weakness (generalized): Secondary | ICD-10-CM | POA: Diagnosis not present

## 2023-05-12 DIAGNOSIS — R2689 Other abnormalities of gait and mobility: Secondary | ICD-10-CM | POA: Diagnosis not present

## 2023-05-12 DIAGNOSIS — I69398 Other sequelae of cerebral infarction: Secondary | ICD-10-CM | POA: Diagnosis not present

## 2023-05-12 DIAGNOSIS — M6281 Muscle weakness (generalized): Secondary | ICD-10-CM | POA: Diagnosis not present

## 2023-05-13 DIAGNOSIS — I69398 Other sequelae of cerebral infarction: Secondary | ICD-10-CM | POA: Diagnosis not present

## 2023-05-13 DIAGNOSIS — M6281 Muscle weakness (generalized): Secondary | ICD-10-CM | POA: Diagnosis not present

## 2023-05-13 DIAGNOSIS — R2689 Other abnormalities of gait and mobility: Secondary | ICD-10-CM | POA: Diagnosis not present

## 2023-05-14 ENCOUNTER — Encounter: Payer: Self-pay | Admitting: Urology

## 2023-05-14 ENCOUNTER — Ambulatory Visit (INDEPENDENT_AMBULATORY_CARE_PROVIDER_SITE_OTHER): Payer: Medicare Other | Admitting: Urology

## 2023-05-14 DIAGNOSIS — R339 Retention of urine, unspecified: Secondary | ICD-10-CM

## 2023-05-14 DIAGNOSIS — R2689 Other abnormalities of gait and mobility: Secondary | ICD-10-CM | POA: Diagnosis not present

## 2023-05-14 DIAGNOSIS — I6930 Unspecified sequelae of cerebral infarction: Secondary | ICD-10-CM

## 2023-05-14 DIAGNOSIS — I69398 Other sequelae of cerebral infarction: Secondary | ICD-10-CM | POA: Diagnosis not present

## 2023-05-14 DIAGNOSIS — R262 Difficulty in walking, not elsewhere classified: Secondary | ICD-10-CM

## 2023-05-14 DIAGNOSIS — N319 Neuromuscular dysfunction of bladder, unspecified: Secondary | ICD-10-CM | POA: Diagnosis not present

## 2023-05-14 DIAGNOSIS — Z978 Presence of other specified devices: Secondary | ICD-10-CM

## 2023-05-14 DIAGNOSIS — M6281 Muscle weakness (generalized): Secondary | ICD-10-CM | POA: Diagnosis not present

## 2023-05-15 DIAGNOSIS — I69398 Other sequelae of cerebral infarction: Secondary | ICD-10-CM | POA: Diagnosis not present

## 2023-05-15 DIAGNOSIS — R2689 Other abnormalities of gait and mobility: Secondary | ICD-10-CM | POA: Diagnosis not present

## 2023-05-15 DIAGNOSIS — M6281 Muscle weakness (generalized): Secondary | ICD-10-CM | POA: Diagnosis not present

## 2023-05-16 DIAGNOSIS — I69398 Other sequelae of cerebral infarction: Secondary | ICD-10-CM | POA: Diagnosis not present

## 2023-05-16 DIAGNOSIS — M6281 Muscle weakness (generalized): Secondary | ICD-10-CM | POA: Diagnosis not present

## 2023-05-16 DIAGNOSIS — R2689 Other abnormalities of gait and mobility: Secondary | ICD-10-CM | POA: Diagnosis not present

## 2023-05-17 DIAGNOSIS — M6281 Muscle weakness (generalized): Secondary | ICD-10-CM | POA: Diagnosis not present

## 2023-05-17 DIAGNOSIS — R339 Retention of urine, unspecified: Secondary | ICD-10-CM | POA: Diagnosis not present

## 2023-05-17 DIAGNOSIS — R2689 Other abnormalities of gait and mobility: Secondary | ICD-10-CM | POA: Diagnosis not present

## 2023-05-17 DIAGNOSIS — Z79899 Other long term (current) drug therapy: Secondary | ICD-10-CM | POA: Diagnosis not present

## 2023-05-17 DIAGNOSIS — N319 Neuromuscular dysfunction of bladder, unspecified: Secondary | ICD-10-CM | POA: Diagnosis not present

## 2023-05-17 DIAGNOSIS — G629 Polyneuropathy, unspecified: Secondary | ICD-10-CM | POA: Diagnosis not present

## 2023-05-17 DIAGNOSIS — I69398 Other sequelae of cerebral infarction: Secondary | ICD-10-CM | POA: Diagnosis not present

## 2023-05-18 DIAGNOSIS — I69398 Other sequelae of cerebral infarction: Secondary | ICD-10-CM | POA: Diagnosis not present

## 2023-05-18 DIAGNOSIS — M6281 Muscle weakness (generalized): Secondary | ICD-10-CM | POA: Diagnosis not present

## 2023-05-18 DIAGNOSIS — R2689 Other abnormalities of gait and mobility: Secondary | ICD-10-CM | POA: Diagnosis not present

## 2023-05-19 DIAGNOSIS — I69398 Other sequelae of cerebral infarction: Secondary | ICD-10-CM | POA: Diagnosis not present

## 2023-05-19 DIAGNOSIS — M6281 Muscle weakness (generalized): Secondary | ICD-10-CM | POA: Diagnosis not present

## 2023-05-19 DIAGNOSIS — R2689 Other abnormalities of gait and mobility: Secondary | ICD-10-CM | POA: Diagnosis not present

## 2023-05-20 DIAGNOSIS — I69398 Other sequelae of cerebral infarction: Secondary | ICD-10-CM | POA: Diagnosis not present

## 2023-05-20 DIAGNOSIS — M6281 Muscle weakness (generalized): Secondary | ICD-10-CM | POA: Diagnosis not present

## 2023-05-20 DIAGNOSIS — R2689 Other abnormalities of gait and mobility: Secondary | ICD-10-CM | POA: Diagnosis not present

## 2023-05-21 DIAGNOSIS — R2689 Other abnormalities of gait and mobility: Secondary | ICD-10-CM | POA: Diagnosis not present

## 2023-05-21 DIAGNOSIS — I69398 Other sequelae of cerebral infarction: Secondary | ICD-10-CM | POA: Diagnosis not present

## 2023-05-21 DIAGNOSIS — M6281 Muscle weakness (generalized): Secondary | ICD-10-CM | POA: Diagnosis not present

## 2023-05-24 ENCOUNTER — Telehealth: Payer: Self-pay

## 2023-05-24 DIAGNOSIS — I69398 Other sequelae of cerebral infarction: Secondary | ICD-10-CM | POA: Diagnosis not present

## 2023-05-24 DIAGNOSIS — M6281 Muscle weakness (generalized): Secondary | ICD-10-CM | POA: Diagnosis not present

## 2023-05-24 DIAGNOSIS — R2689 Other abnormalities of gait and mobility: Secondary | ICD-10-CM | POA: Diagnosis not present

## 2023-05-24 NOTE — Patient Outreach (Signed)
 No Telephone outreach to patient. Dr Pearlean Brownie obtained mRS successfully 04/29/23. MRS= 4  Van[facility] Management Assistant  Direct Dial: [phone]  Fax: [phone] Website: [email or web address]

## 2023-05-25 DIAGNOSIS — R2689 Other abnormalities of gait and mobility: Secondary | ICD-10-CM | POA: Diagnosis not present

## 2023-05-25 DIAGNOSIS — I69398 Other sequelae of cerebral infarction: Secondary | ICD-10-CM | POA: Diagnosis not present

## 2023-05-25 DIAGNOSIS — M6281 Muscle weakness (generalized): Secondary | ICD-10-CM | POA: Diagnosis not present

## 2023-05-27 DIAGNOSIS — I10 Essential (primary) hypertension: Secondary | ICD-10-CM | POA: Diagnosis not present

## 2023-05-27 DIAGNOSIS — I69398 Other sequelae of cerebral infarction: Secondary | ICD-10-CM | POA: Diagnosis not present

## 2023-05-27 DIAGNOSIS — M6281 Muscle weakness (generalized): Secondary | ICD-10-CM | POA: Diagnosis not present

## 2023-05-27 DIAGNOSIS — Z79899 Other long term (current) drug therapy: Secondary | ICD-10-CM | POA: Diagnosis not present

## 2023-05-27 DIAGNOSIS — R2689 Other abnormalities of gait and mobility: Secondary | ICD-10-CM | POA: Diagnosis not present

## 2023-05-27 DIAGNOSIS — R52 Pain, unspecified: Secondary | ICD-10-CM | POA: Diagnosis not present

## 2023-05-27 DIAGNOSIS — G629 Polyneuropathy, unspecified: Secondary | ICD-10-CM | POA: Diagnosis not present

## 2023-05-27 DIAGNOSIS — I2510 Atherosclerotic heart disease of native coronary artery without angina pectoris: Secondary | ICD-10-CM | POA: Diagnosis not present

## 2023-05-27 DIAGNOSIS — I6322 Cerebral infarction due to unspecified occlusion or stenosis of basilar arteries: Secondary | ICD-10-CM | POA: Diagnosis not present

## 2023-05-28 ENCOUNTER — Ambulatory Visit: Payer: Medicare Other | Admitting: Urology

## 2023-05-28 DIAGNOSIS — M6281 Muscle weakness (generalized): Secondary | ICD-10-CM | POA: Diagnosis not present

## 2023-05-28 DIAGNOSIS — R2689 Other abnormalities of gait and mobility: Secondary | ICD-10-CM | POA: Diagnosis not present

## 2023-05-28 DIAGNOSIS — I69398 Other sequelae of cerebral infarction: Secondary | ICD-10-CM | POA: Diagnosis not present

## 2023-05-29 DIAGNOSIS — R2689 Other abnormalities of gait and mobility: Secondary | ICD-10-CM | POA: Diagnosis not present

## 2023-05-29 DIAGNOSIS — M6281 Muscle weakness (generalized): Secondary | ICD-10-CM | POA: Diagnosis not present

## 2023-05-29 DIAGNOSIS — I69398 Other sequelae of cerebral infarction: Secondary | ICD-10-CM | POA: Diagnosis not present

## 2023-05-31 DIAGNOSIS — M6281 Muscle weakness (generalized): Secondary | ICD-10-CM | POA: Diagnosis not present

## 2023-05-31 DIAGNOSIS — I69398 Other sequelae of cerebral infarction: Secondary | ICD-10-CM | POA: Diagnosis not present

## 2023-05-31 DIAGNOSIS — R2689 Other abnormalities of gait and mobility: Secondary | ICD-10-CM | POA: Diagnosis not present

## 2023-06-01 DIAGNOSIS — M6281 Muscle weakness (generalized): Secondary | ICD-10-CM | POA: Diagnosis not present

## 2023-06-01 DIAGNOSIS — I69398 Other sequelae of cerebral infarction: Secondary | ICD-10-CM | POA: Diagnosis not present

## 2023-06-01 DIAGNOSIS — R2689 Other abnormalities of gait and mobility: Secondary | ICD-10-CM | POA: Diagnosis not present

## 2023-06-02 DIAGNOSIS — R2689 Other abnormalities of gait and mobility: Secondary | ICD-10-CM | POA: Diagnosis not present

## 2023-06-02 DIAGNOSIS — I69398 Other sequelae of cerebral infarction: Secondary | ICD-10-CM | POA: Diagnosis not present

## 2023-06-02 DIAGNOSIS — M6281 Muscle weakness (generalized): Secondary | ICD-10-CM | POA: Diagnosis not present

## 2023-06-03 DIAGNOSIS — H26493 Other secondary cataract, bilateral: Secondary | ICD-10-CM | POA: Diagnosis not present

## 2023-06-03 DIAGNOSIS — Z961 Presence of intraocular lens: Secondary | ICD-10-CM | POA: Diagnosis not present

## 2023-06-03 DIAGNOSIS — M6281 Muscle weakness (generalized): Secondary | ICD-10-CM | POA: Diagnosis not present

## 2023-06-03 DIAGNOSIS — H53461 Homonymous bilateral field defects, right side: Secondary | ICD-10-CM | POA: Diagnosis not present

## 2023-06-03 DIAGNOSIS — I6930 Unspecified sequelae of cerebral infarction: Secondary | ICD-10-CM | POA: Diagnosis not present

## 2023-06-03 DIAGNOSIS — I10 Essential (primary) hypertension: Secondary | ICD-10-CM | POA: Diagnosis not present

## 2023-06-03 DIAGNOSIS — Z79899 Other long term (current) drug therapy: Secondary | ICD-10-CM | POA: Diagnosis not present

## 2023-06-03 DIAGNOSIS — I69398 Other sequelae of cerebral infarction: Secondary | ICD-10-CM | POA: Diagnosis not present

## 2023-06-03 DIAGNOSIS — I6322 Cerebral infarction due to unspecified occlusion or stenosis of basilar arteries: Secondary | ICD-10-CM | POA: Diagnosis not present

## 2023-06-03 DIAGNOSIS — R2689 Other abnormalities of gait and mobility: Secondary | ICD-10-CM | POA: Diagnosis not present

## 2023-06-03 DIAGNOSIS — H538 Other visual disturbances: Secondary | ICD-10-CM | POA: Diagnosis not present

## 2023-06-04 DIAGNOSIS — M6281 Muscle weakness (generalized): Secondary | ICD-10-CM | POA: Diagnosis not present

## 2023-06-04 DIAGNOSIS — R2689 Other abnormalities of gait and mobility: Secondary | ICD-10-CM | POA: Diagnosis not present

## 2023-06-04 DIAGNOSIS — I69398 Other sequelae of cerebral infarction: Secondary | ICD-10-CM | POA: Diagnosis not present

## 2023-06-10 DIAGNOSIS — G629 Polyneuropathy, unspecified: Secondary | ICD-10-CM | POA: Diagnosis not present

## 2023-06-10 DIAGNOSIS — Z79899 Other long term (current) drug therapy: Secondary | ICD-10-CM | POA: Diagnosis not present

## 2023-07-06 DIAGNOSIS — R52 Pain, unspecified: Secondary | ICD-10-CM | POA: Diagnosis not present

## 2023-07-06 DIAGNOSIS — G629 Polyneuropathy, unspecified: Secondary | ICD-10-CM | POA: Diagnosis not present

## 2023-07-06 DIAGNOSIS — Z79899 Other long term (current) drug therapy: Secondary | ICD-10-CM | POA: Diagnosis not present

## 2023-07-21 DIAGNOSIS — H538 Other visual disturbances: Secondary | ICD-10-CM | POA: Diagnosis not present

## 2023-07-21 DIAGNOSIS — H53461 Homonymous bilateral field defects, right side: Secondary | ICD-10-CM | POA: Diagnosis not present

## 2023-07-21 DIAGNOSIS — E785 Hyperlipidemia, unspecified: Secondary | ICD-10-CM | POA: Diagnosis not present

## 2023-07-21 DIAGNOSIS — I10 Essential (primary) hypertension: Secondary | ICD-10-CM | POA: Diagnosis not present

## 2023-07-21 DIAGNOSIS — N319 Neuromuscular dysfunction of bladder, unspecified: Secondary | ICD-10-CM | POA: Diagnosis not present

## 2023-07-21 DIAGNOSIS — Z961 Presence of intraocular lens: Secondary | ICD-10-CM | POA: Diagnosis not present

## 2023-07-21 DIAGNOSIS — G629 Polyneuropathy, unspecified: Secondary | ICD-10-CM | POA: Diagnosis not present

## 2023-07-21 DIAGNOSIS — R339 Retention of urine, unspecified: Secondary | ICD-10-CM | POA: Diagnosis not present

## 2023-07-21 DIAGNOSIS — E039 Hypothyroidism, unspecified: Secondary | ICD-10-CM | POA: Diagnosis not present

## 2023-07-21 DIAGNOSIS — I6322 Cerebral infarction due to unspecified occlusion or stenosis of basilar arteries: Secondary | ICD-10-CM | POA: Diagnosis not present

## 2023-07-29 DIAGNOSIS — M6281 Muscle weakness (generalized): Secondary | ICD-10-CM | POA: Diagnosis not present

## 2023-07-29 DIAGNOSIS — R269 Unspecified abnormalities of gait and mobility: Secondary | ICD-10-CM | POA: Diagnosis not present

## 2023-07-29 DIAGNOSIS — I69398 Other sequelae of cerebral infarction: Secondary | ICD-10-CM | POA: Diagnosis not present

## 2023-07-29 DIAGNOSIS — R2689 Other abnormalities of gait and mobility: Secondary | ICD-10-CM | POA: Diagnosis not present

## 2023-07-30 DIAGNOSIS — R2689 Other abnormalities of gait and mobility: Secondary | ICD-10-CM | POA: Diagnosis not present

## 2023-07-30 DIAGNOSIS — M6281 Muscle weakness (generalized): Secondary | ICD-10-CM | POA: Diagnosis not present

## 2023-07-30 DIAGNOSIS — I69398 Other sequelae of cerebral infarction: Secondary | ICD-10-CM | POA: Diagnosis not present

## 2023-07-30 DIAGNOSIS — R269 Unspecified abnormalities of gait and mobility: Secondary | ICD-10-CM | POA: Diagnosis not present

## 2023-08-02 DIAGNOSIS — R2689 Other abnormalities of gait and mobility: Secondary | ICD-10-CM | POA: Diagnosis not present

## 2023-08-02 DIAGNOSIS — R269 Unspecified abnormalities of gait and mobility: Secondary | ICD-10-CM | POA: Diagnosis not present

## 2023-08-02 DIAGNOSIS — I69398 Other sequelae of cerebral infarction: Secondary | ICD-10-CM | POA: Diagnosis not present

## 2023-08-02 DIAGNOSIS — M6281 Muscle weakness (generalized): Secondary | ICD-10-CM | POA: Diagnosis not present

## 2023-08-03 DIAGNOSIS — R269 Unspecified abnormalities of gait and mobility: Secondary | ICD-10-CM | POA: Diagnosis not present

## 2023-08-03 DIAGNOSIS — K5900 Constipation, unspecified: Secondary | ICD-10-CM | POA: Diagnosis not present

## 2023-08-03 DIAGNOSIS — M6281 Muscle weakness (generalized): Secondary | ICD-10-CM | POA: Diagnosis not present

## 2023-08-03 DIAGNOSIS — I69398 Other sequelae of cerebral infarction: Secondary | ICD-10-CM | POA: Diagnosis not present

## 2023-08-03 DIAGNOSIS — Z79899 Other long term (current) drug therapy: Secondary | ICD-10-CM | POA: Diagnosis not present

## 2023-08-03 DIAGNOSIS — R2689 Other abnormalities of gait and mobility: Secondary | ICD-10-CM | POA: Diagnosis not present

## 2023-08-04 DIAGNOSIS — M6281 Muscle weakness (generalized): Secondary | ICD-10-CM | POA: Diagnosis not present

## 2023-08-04 DIAGNOSIS — R269 Unspecified abnormalities of gait and mobility: Secondary | ICD-10-CM | POA: Diagnosis not present

## 2023-08-04 DIAGNOSIS — I69398 Other sequelae of cerebral infarction: Secondary | ICD-10-CM | POA: Diagnosis not present

## 2023-08-04 DIAGNOSIS — R2689 Other abnormalities of gait and mobility: Secondary | ICD-10-CM | POA: Diagnosis not present

## 2023-08-05 DIAGNOSIS — M6281 Muscle weakness (generalized): Secondary | ICD-10-CM | POA: Diagnosis not present

## 2023-08-05 DIAGNOSIS — I69398 Other sequelae of cerebral infarction: Secondary | ICD-10-CM | POA: Diagnosis not present

## 2023-08-05 DIAGNOSIS — G629 Polyneuropathy, unspecified: Secondary | ICD-10-CM | POA: Diagnosis not present

## 2023-08-05 DIAGNOSIS — R2689 Other abnormalities of gait and mobility: Secondary | ICD-10-CM | POA: Diagnosis not present

## 2023-08-05 DIAGNOSIS — Z79899 Other long term (current) drug therapy: Secondary | ICD-10-CM | POA: Diagnosis not present

## 2023-08-05 DIAGNOSIS — R269 Unspecified abnormalities of gait and mobility: Secondary | ICD-10-CM | POA: Diagnosis not present

## 2023-08-06 DIAGNOSIS — M6281 Muscle weakness (generalized): Secondary | ICD-10-CM | POA: Diagnosis not present

## 2023-08-06 DIAGNOSIS — R269 Unspecified abnormalities of gait and mobility: Secondary | ICD-10-CM | POA: Diagnosis not present

## 2023-08-06 DIAGNOSIS — R2689 Other abnormalities of gait and mobility: Secondary | ICD-10-CM | POA: Diagnosis not present

## 2023-08-06 DIAGNOSIS — I69398 Other sequelae of cerebral infarction: Secondary | ICD-10-CM | POA: Diagnosis not present

## 2023-08-09 DIAGNOSIS — R269 Unspecified abnormalities of gait and mobility: Secondary | ICD-10-CM | POA: Diagnosis not present

## 2023-08-09 DIAGNOSIS — M6281 Muscle weakness (generalized): Secondary | ICD-10-CM | POA: Diagnosis not present

## 2023-08-09 DIAGNOSIS — I69398 Other sequelae of cerebral infarction: Secondary | ICD-10-CM | POA: Diagnosis not present

## 2023-08-09 DIAGNOSIS — R2689 Other abnormalities of gait and mobility: Secondary | ICD-10-CM | POA: Diagnosis not present

## 2023-08-10 DIAGNOSIS — M6281 Muscle weakness (generalized): Secondary | ICD-10-CM | POA: Diagnosis not present

## 2023-08-10 DIAGNOSIS — I10 Essential (primary) hypertension: Secondary | ICD-10-CM | POA: Diagnosis not present

## 2023-08-10 DIAGNOSIS — I2510 Atherosclerotic heart disease of native coronary artery without angina pectoris: Secondary | ICD-10-CM | POA: Diagnosis not present

## 2023-08-10 DIAGNOSIS — R2689 Other abnormalities of gait and mobility: Secondary | ICD-10-CM | POA: Diagnosis not present

## 2023-08-10 DIAGNOSIS — R52 Pain, unspecified: Secondary | ICD-10-CM | POA: Diagnosis not present

## 2023-08-10 DIAGNOSIS — R269 Unspecified abnormalities of gait and mobility: Secondary | ICD-10-CM | POA: Diagnosis not present

## 2023-08-10 DIAGNOSIS — M1991 Primary osteoarthritis, unspecified site: Secondary | ICD-10-CM | POA: Diagnosis not present

## 2023-08-10 DIAGNOSIS — Z79899 Other long term (current) drug therapy: Secondary | ICD-10-CM | POA: Diagnosis not present

## 2023-08-10 DIAGNOSIS — I69398 Other sequelae of cerebral infarction: Secondary | ICD-10-CM | POA: Diagnosis not present

## 2023-08-10 DIAGNOSIS — E039 Hypothyroidism, unspecified: Secondary | ICD-10-CM | POA: Diagnosis not present

## 2023-08-11 DIAGNOSIS — R2689 Other abnormalities of gait and mobility: Secondary | ICD-10-CM | POA: Diagnosis not present

## 2023-08-11 DIAGNOSIS — M6281 Muscle weakness (generalized): Secondary | ICD-10-CM | POA: Diagnosis not present

## 2023-08-11 DIAGNOSIS — R269 Unspecified abnormalities of gait and mobility: Secondary | ICD-10-CM | POA: Diagnosis not present

## 2023-08-11 DIAGNOSIS — I69398 Other sequelae of cerebral infarction: Secondary | ICD-10-CM | POA: Diagnosis not present

## 2023-08-12 DIAGNOSIS — I69398 Other sequelae of cerebral infarction: Secondary | ICD-10-CM | POA: Diagnosis not present

## 2023-08-12 DIAGNOSIS — R269 Unspecified abnormalities of gait and mobility: Secondary | ICD-10-CM | POA: Diagnosis not present

## 2023-08-12 DIAGNOSIS — R2689 Other abnormalities of gait and mobility: Secondary | ICD-10-CM | POA: Diagnosis not present

## 2023-08-12 DIAGNOSIS — M6281 Muscle weakness (generalized): Secondary | ICD-10-CM | POA: Diagnosis not present

## 2023-08-12 NOTE — Progress Notes (Unsigned)
 Name: Grant Berry DOB: 08/30/1945 MRN: [id number]  History of Present Illness: Mr. Zink is a 78 y.o. male who presents today for follow up visit at [facility] [place].   > GU history includes: 1. Urinary retention. - 01/31/2023: Stroke. Failed voiding trial at [facility] after hospital discharge. - 02/26/2023: First Urology visit. Voiding trial deferred due to concerns for repeat acute urinary retention, non-ambulatory status / falls, overflow incontinence, skin breakdown, kidney injury, UTIs, etc.  - Taking Tamsulosin  (Flomax ) 0.4 mg daily.    At last visit on 05/14/2023: - Continues to work on rehab / rebuilding his strength with PT/OT. Foley exchanged monthly at SNF. - We discussed potential pros vs. cons and risks vs. benefits of doing a voiding trial either in office or at his SNF; he elected to continue indwelling Foley catheter for now since he is unable to ambulate independently.  ***possible voiding trial  Today: He reports ***  He reports the catheter is draining ***.  He {Actions; denies-reports:120008} gross hematuria.  He {Actions; denies-reports:120008} flank pain or abdominal pain. He {Actions; denies-reports:120008} constipation.   Medications: Current Outpatient Medications  Medication Sig Dispense Refill   acetaminophen  (TYLENOL ) 650 MG CR tablet Take 650 mg by mouth every 8 (eight) hours as needed for pain.     AMBULATORY NON FORMULARY MEDICATION Staff at [facility] are instructed to exchange patient's indwelling Foley catheter at least once every 4 weeks or more often if needed should catheter become clogged / stop draining adequately. 1 each PRN   aspirin  EC 81 MG tablet Take 1 tablet (81 mg total) by mouth daily. Swallow whole.     B Complex-C (B-COMPLEX WITH VITAMIN C) tablet Take 1 tablet by mouth daily. 90 tablet 1   clopidogrel  (PLAVIX ) 75 MG tablet Take 1 tablet (75 mg total) by mouth daily.     ezetimibe  (ZETIA ) 10 MG tablet Take 10  mg by mouth daily.     levothyroxine (SYNTHROID) 50 MCG tablet Take 50 mcg by mouth daily before breakfast.     MAGNESIUM PO Take 1 tablet by mouth in the morning and at bedtime.     Omega-3 Fatty Acids (FISH OIL PO) Take 1 capsule by mouth daily.     ondansetron  (ZOFRAN ) 4 MG tablet Take 4 mg by mouth every 6 (six) hours as needed.     pregabalin (LYRICA) 50 MG capsule Take 50 mg by mouth 3 (three) times daily.     QUEtiapine  (SEROQUEL ) 25 MG tablet Take 1 tablet (25 mg total) by mouth daily after breakfast.     QUEtiapine  (SEROQUEL ) 50 MG tablet Take 1 tablet (50 mg total) by mouth at bedtime.     senna-docusate (SENOKOT-S) 8.6-50 MG tablet Take 1 tablet by mouth daily.     sertraline (ZOLOFT) 50 MG tablet Take 50 mg by mouth daily.     tamsulosin  (FLOMAX ) 0.4 MG CAPS capsule Take 1 capsule (0.4 mg total) by mouth daily. 30 capsule 11   No current facility-administered medications for this visit.    Allergies: Allergies  Allergen Reactions   Lisinopril      Dizziness   Atorvastatin  Other (See Comments)    Feels like crap, muscle pain, & dizziness.   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   Hyperlipidemia    Hypertension    Past Surgical History:  Procedure Laterality Date   EYE SURGERY  Bilateral    IR CT HEAD LTD  01/31/2023   IR INTRA CRAN STENT  01/31/2023   IR INTRAVSC STENT CERV CAROTID W/O EMB-PROT MOD SED INC ANGIO  01/31/2023   IR PERCUTANEOUS ART THROMBECTOMY/INFUSION INTRACRANIAL INC DIAG ANGIO  01/31/2023   RADIOLOGY WITH ANESTHESIA N/A 01/31/2023   Procedure: IR WITH ANESTHESIA;  Surgeon: Radiologist, Medication, MD;  Location: [facility] OR;  Service: Radiology;  Laterality: N/A;   TONSILECTOMY/ADENOIDECTOMY WITH MYRINGOTOMY     Family History  Problem Relation Age of Onset   Stroke Mother    Coronary artery disease Mother    Coronary artery disease Father    Diabetes Maternal Grandfather     Social History   Socioeconomic History   Marital status: Widowed    Spouse name: Not on file   Number of children: 1   Years of education: Not on file   Highest education level: Not on file  Occupational History   Occupation: retired   Tobacco Use   Smoking status: Former    Current packs/day: 0.00    Types: Cigarettes    Quit date: 05/24/1988    Years since quitting: 35.2   Smokeless tobacco: Never  Vaping Use   Vaping status: Never Used  Substance and Sexual Activity   Alcohol use: No   Drug use: No   Sexual activity: Not on file  Other Topics Concern   Not on file  Social History Narrative   Live at home = son stays there    Right Handed   Drinks 1-2 cups of caffeine/daily   Stroke in 2021, 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 and has to use a walker   Social Drivers of Health   Financial Resource Strain: Low Risk  (08/03/2022)   Overall Financial Resource Strain (CARDIA)    Difficulty of Paying Living Expenses: Not hard at all  Food Insecurity: No Food Insecurity (01/31/2023)   Hunger Vital Sign    Worried About Running Out of Food in the Last Year: Never true    Ran Out of Food in the Last Year: Never true  Transportation Needs: No Transportation Needs (01/31/2023)   PRAPARE - Administrator, Civil Service (Medical): No    Lack of Transportation (Non-Medical): No  Physical Activity: Insufficiently Active (08/03/2022)   Exercise Vital Sign    Days of Exercise per Week: 3 days    Minutes of Exercise per Session: 30 min  Stress: No Stress Concern Present (08/03/2022)   [employer] of Occupational Health - Occupational Stress Questionnaire    Feeling of Stress : Not at all  Social Connections: Socially Isolated (08/03/2022)   Social Connection and Isolation Panel    Frequency of Communication with Friends and Family: More than three times a week    Frequency of Social Gatherings with Friends and Family: More than three times a week    Attends Religious  Services: Never    Database administrator or Organizations: No    Attends Banker Meetings: Never    Marital Status: Widowed  Intimate Partner Violence: Not At Risk (01/31/2023)   Humiliation, Afraid, Rape, and Kick questionnaire    Fear of Current or Ex-Partner: No    Emotionally Abused: No    Physically Abused: No    Sexually Abused: No    Review of Systems Constitutional: Patient denies any unintentional weight loss or change in strength lntegumentary: Patient denies any rashes or pruritus Cardiovascular: Patient denies chest pain or syncope Respiratory:  Patient denies shortness of breath Gastrointestinal: ***Patient denies nausea, vomiting, constipation, or diarrhea ***As per HPI Musculoskeletal: Patient denies muscle cramps or weakness Neurologic: Patient denies convulsions or seizures Allergic/Immunologic: Patient denies recent allergic reaction(s) Hematologic/Lymphatic: Patient denies bleeding tendencies Endocrine: Patient denies heat/cold intolerance  GU: As per HPI.  OBJECTIVE There were no vitals filed for this visit. There is no height or weight on file to calculate BMI.  Physical Examination Constitutional: No obvious distress; patient is non-toxic appearing  Cardiovascular: No visible lower extremity edema.  Respiratory: The patient does not have audible wheezing/stridor; respirations do not appear labored  Gastrointestinal: Abdomen non-distended Musculoskeletal: Normal ROM of UEs  Skin: No obvious rashes/open sores  Neurologic: CN 2-12 grossly intact Psychiatric: Answered questions appropriately with normal affect  Hematologic/Lymphatic/Immunologic: No obvious bruises or sites of spontaneous bleeding  UA: ***negative ***positive for *** leukocytes, *** blood, ***nitrites Urine microscopy: *** WBC/hpf, *** RBC/hpf, *** bacteria ***glucosuria (secondary to ***Jardiance ***Farxiga use) ***otherwise unremarkable  PVR: *** ml  ASSESSMENT No  diagnosis found. ***  We agreed to plan for follow up in *** months / ***1 year or sooner if needed. Patient verbalized understanding of and agreement with current plan. All questions were answered.  PLAN Advised the following: 1. *** 2. ***No follow-ups on file.  No orders of the defined types were placed in this encounter.   It has been explained that the patient is to follow regularly with their PCP in addition to all other providers involved in their care and to follow instructions provided by these respective offices. Patient advised to contact urology clinic if any urologic-pertaining questions, concerns, new symptoms or problems arise in the interim period.  There are no Patient Instructions on file for this visit.  Electronically signed by:  Lauretta Ponto, FNP   08/12/23    1:08 PM

## 2023-08-13 DIAGNOSIS — M6281 Muscle weakness (generalized): Secondary | ICD-10-CM | POA: Diagnosis not present

## 2023-08-13 DIAGNOSIS — I69398 Other sequelae of cerebral infarction: Secondary | ICD-10-CM | POA: Diagnosis not present

## 2023-08-13 DIAGNOSIS — R2689 Other abnormalities of gait and mobility: Secondary | ICD-10-CM | POA: Diagnosis not present

## 2023-08-13 DIAGNOSIS — R269 Unspecified abnormalities of gait and mobility: Secondary | ICD-10-CM | POA: Diagnosis not present

## 2023-08-16 DIAGNOSIS — I69398 Other sequelae of cerebral infarction: Secondary | ICD-10-CM | POA: Diagnosis not present

## 2023-08-16 DIAGNOSIS — R269 Unspecified abnormalities of gait and mobility: Secondary | ICD-10-CM | POA: Diagnosis not present

## 2023-08-16 DIAGNOSIS — Z79899 Other long term (current) drug therapy: Secondary | ICD-10-CM | POA: Diagnosis not present

## 2023-08-16 DIAGNOSIS — R2689 Other abnormalities of gait and mobility: Secondary | ICD-10-CM | POA: Diagnosis not present

## 2023-08-16 DIAGNOSIS — M6281 Muscle weakness (generalized): Secondary | ICD-10-CM | POA: Diagnosis not present

## 2023-08-17 ENCOUNTER — Encounter: Payer: Self-pay | Admitting: Urology

## 2023-08-17 ENCOUNTER — Ambulatory Visit: Admitting: Urology

## 2023-08-17 VITALS — BP 134/68 | HR 62

## 2023-08-17 DIAGNOSIS — Z8673 Personal history of transient ischemic attack (TIA), and cerebral infarction without residual deficits: Secondary | ICD-10-CM

## 2023-08-17 DIAGNOSIS — I6930 Unspecified sequelae of cerebral infarction: Secondary | ICD-10-CM

## 2023-08-17 DIAGNOSIS — M6281 Muscle weakness (generalized): Secondary | ICD-10-CM | POA: Diagnosis not present

## 2023-08-17 DIAGNOSIS — R339 Retention of urine, unspecified: Secondary | ICD-10-CM | POA: Diagnosis not present

## 2023-08-17 DIAGNOSIS — N319 Neuromuscular dysfunction of bladder, unspecified: Secondary | ICD-10-CM | POA: Diagnosis not present

## 2023-08-17 DIAGNOSIS — I69398 Other sequelae of cerebral infarction: Secondary | ICD-10-CM | POA: Diagnosis not present

## 2023-08-17 DIAGNOSIS — Z978 Presence of other specified devices: Secondary | ICD-10-CM

## 2023-08-17 DIAGNOSIS — R2689 Other abnormalities of gait and mobility: Secondary | ICD-10-CM | POA: Diagnosis not present

## 2023-08-17 DIAGNOSIS — R269 Unspecified abnormalities of gait and mobility: Secondary | ICD-10-CM | POA: Diagnosis not present

## 2023-08-17 LAB — URINALYSIS, ROUTINE W REFLEX MICROSCOPIC
Bilirubin, UA: NEGATIVE
Glucose, UA: NEGATIVE
Ketones, UA: NEGATIVE
Nitrite, UA: POSITIVE — AB
Protein,UA: NEGATIVE
RBC, UA: NEGATIVE
Specific Gravity, UA: 1.02 (ref 1.005–1.030)
Urobilinogen, Ur: 0.2 mg/dL (ref 0.2–1.0)
pH, UA: 6 (ref 5.0–7.5)

## 2023-08-17 LAB — MICROSCOPIC EXAMINATION: WBC, UA: >30 /HPF — AB (ref 0–5)

## 2023-08-17 LAB — BLADDER SCAN AMB NON-IMAGING: Scan Result: 147

## 2023-08-17 NOTE — Progress Notes (Signed)
post void residual=147

## 2023-08-18 DIAGNOSIS — M6281 Muscle weakness (generalized): Secondary | ICD-10-CM | POA: Diagnosis not present

## 2023-08-18 DIAGNOSIS — R2689 Other abnormalities of gait and mobility: Secondary | ICD-10-CM | POA: Diagnosis not present

## 2023-08-18 DIAGNOSIS — I69398 Other sequelae of cerebral infarction: Secondary | ICD-10-CM | POA: Diagnosis not present

## 2023-08-18 DIAGNOSIS — R269 Unspecified abnormalities of gait and mobility: Secondary | ICD-10-CM | POA: Diagnosis not present

## 2023-08-19 DIAGNOSIS — R2689 Other abnormalities of gait and mobility: Secondary | ICD-10-CM | POA: Diagnosis not present

## 2023-08-19 DIAGNOSIS — M6281 Muscle weakness (generalized): Secondary | ICD-10-CM | POA: Diagnosis not present

## 2023-08-19 DIAGNOSIS — R269 Unspecified abnormalities of gait and mobility: Secondary | ICD-10-CM | POA: Diagnosis not present

## 2023-08-19 DIAGNOSIS — I69398 Other sequelae of cerebral infarction: Secondary | ICD-10-CM | POA: Diagnosis not present

## 2023-08-20 DIAGNOSIS — M6281 Muscle weakness (generalized): Secondary | ICD-10-CM | POA: Diagnosis not present

## 2023-08-20 DIAGNOSIS — R2689 Other abnormalities of gait and mobility: Secondary | ICD-10-CM | POA: Diagnosis not present

## 2023-08-20 DIAGNOSIS — Z79899 Other long term (current) drug therapy: Secondary | ICD-10-CM | POA: Diagnosis not present

## 2023-08-20 DIAGNOSIS — R269 Unspecified abnormalities of gait and mobility: Secondary | ICD-10-CM | POA: Diagnosis not present

## 2023-08-20 DIAGNOSIS — E039 Hypothyroidism, unspecified: Secondary | ICD-10-CM | POA: Diagnosis not present

## 2023-08-20 DIAGNOSIS — E782 Mixed hyperlipidemia: Secondary | ICD-10-CM | POA: Diagnosis not present

## 2023-08-20 DIAGNOSIS — I69398 Other sequelae of cerebral infarction: Secondary | ICD-10-CM | POA: Diagnosis not present

## 2023-08-23 DIAGNOSIS — I69398 Other sequelae of cerebral infarction: Secondary | ICD-10-CM | POA: Diagnosis not present

## 2023-08-23 DIAGNOSIS — R269 Unspecified abnormalities of gait and mobility: Secondary | ICD-10-CM | POA: Diagnosis not present

## 2023-08-23 DIAGNOSIS — R2689 Other abnormalities of gait and mobility: Secondary | ICD-10-CM | POA: Diagnosis not present

## 2023-08-23 DIAGNOSIS — M6281 Muscle weakness (generalized): Secondary | ICD-10-CM | POA: Diagnosis not present

## 2023-08-24 DIAGNOSIS — I2510 Atherosclerotic heart disease of native coronary artery without angina pectoris: Secondary | ICD-10-CM | POA: Diagnosis not present

## 2023-08-24 DIAGNOSIS — I69398 Other sequelae of cerebral infarction: Secondary | ICD-10-CM | POA: Diagnosis not present

## 2023-08-24 DIAGNOSIS — M6281 Muscle weakness (generalized): Secondary | ICD-10-CM | POA: Diagnosis not present

## 2023-08-24 DIAGNOSIS — R2689 Other abnormalities of gait and mobility: Secondary | ICD-10-CM | POA: Diagnosis not present

## 2023-08-24 DIAGNOSIS — R269 Unspecified abnormalities of gait and mobility: Secondary | ICD-10-CM | POA: Diagnosis not present

## 2023-08-24 DIAGNOSIS — Z79899 Other long term (current) drug therapy: Secondary | ICD-10-CM | POA: Diagnosis not present

## 2023-08-24 DIAGNOSIS — E785 Hyperlipidemia, unspecified: Secondary | ICD-10-CM | POA: Diagnosis not present

## 2023-08-25 DIAGNOSIS — I69398 Other sequelae of cerebral infarction: Secondary | ICD-10-CM | POA: Diagnosis not present

## 2023-08-25 DIAGNOSIS — M6281 Muscle weakness (generalized): Secondary | ICD-10-CM | POA: Diagnosis not present

## 2023-08-25 DIAGNOSIS — R269 Unspecified abnormalities of gait and mobility: Secondary | ICD-10-CM | POA: Diagnosis not present

## 2023-08-25 DIAGNOSIS — R2689 Other abnormalities of gait and mobility: Secondary | ICD-10-CM | POA: Diagnosis not present

## 2023-08-26 DIAGNOSIS — R269 Unspecified abnormalities of gait and mobility: Secondary | ICD-10-CM | POA: Diagnosis not present

## 2023-08-26 DIAGNOSIS — R2689 Other abnormalities of gait and mobility: Secondary | ICD-10-CM | POA: Diagnosis not present

## 2023-08-26 DIAGNOSIS — M6281 Muscle weakness (generalized): Secondary | ICD-10-CM | POA: Diagnosis not present

## 2023-08-26 DIAGNOSIS — I69398 Other sequelae of cerebral infarction: Secondary | ICD-10-CM | POA: Diagnosis not present

## 2023-08-28 DIAGNOSIS — M6281 Muscle weakness (generalized): Secondary | ICD-10-CM | POA: Diagnosis not present

## 2023-08-28 DIAGNOSIS — R2689 Other abnormalities of gait and mobility: Secondary | ICD-10-CM | POA: Diagnosis not present

## 2023-08-28 DIAGNOSIS — R269 Unspecified abnormalities of gait and mobility: Secondary | ICD-10-CM | POA: Diagnosis not present

## 2023-08-28 DIAGNOSIS — I69398 Other sequelae of cerebral infarction: Secondary | ICD-10-CM | POA: Diagnosis not present

## 2023-08-29 DIAGNOSIS — I69398 Other sequelae of cerebral infarction: Secondary | ICD-10-CM | POA: Diagnosis not present

## 2023-08-29 DIAGNOSIS — M6281 Muscle weakness (generalized): Secondary | ICD-10-CM | POA: Diagnosis not present

## 2023-08-29 DIAGNOSIS — R269 Unspecified abnormalities of gait and mobility: Secondary | ICD-10-CM | POA: Diagnosis not present

## 2023-08-29 DIAGNOSIS — R2689 Other abnormalities of gait and mobility: Secondary | ICD-10-CM | POA: Diagnosis not present

## 2023-08-30 DIAGNOSIS — R269 Unspecified abnormalities of gait and mobility: Secondary | ICD-10-CM | POA: Diagnosis not present

## 2023-08-30 DIAGNOSIS — M6281 Muscle weakness (generalized): Secondary | ICD-10-CM | POA: Diagnosis not present

## 2023-08-30 DIAGNOSIS — I69398 Other sequelae of cerebral infarction: Secondary | ICD-10-CM | POA: Diagnosis not present

## 2023-08-30 DIAGNOSIS — R2689 Other abnormalities of gait and mobility: Secondary | ICD-10-CM | POA: Diagnosis not present

## 2023-08-31 DIAGNOSIS — M6281 Muscle weakness (generalized): Secondary | ICD-10-CM | POA: Diagnosis not present

## 2023-08-31 DIAGNOSIS — R269 Unspecified abnormalities of gait and mobility: Secondary | ICD-10-CM | POA: Diagnosis not present

## 2023-08-31 DIAGNOSIS — I69398 Other sequelae of cerebral infarction: Secondary | ICD-10-CM | POA: Diagnosis not present

## 2023-08-31 DIAGNOSIS — R2689 Other abnormalities of gait and mobility: Secondary | ICD-10-CM | POA: Diagnosis not present

## 2023-09-01 DIAGNOSIS — M6281 Muscle weakness (generalized): Secondary | ICD-10-CM | POA: Diagnosis not present

## 2023-09-01 DIAGNOSIS — I69398 Other sequelae of cerebral infarction: Secondary | ICD-10-CM | POA: Diagnosis not present

## 2023-09-01 DIAGNOSIS — R2689 Other abnormalities of gait and mobility: Secondary | ICD-10-CM | POA: Diagnosis not present

## 2023-09-01 DIAGNOSIS — R269 Unspecified abnormalities of gait and mobility: Secondary | ICD-10-CM | POA: Diagnosis not present

## 2023-09-02 DIAGNOSIS — Z79899 Other long term (current) drug therapy: Secondary | ICD-10-CM | POA: Diagnosis not present

## 2023-09-02 DIAGNOSIS — R269 Unspecified abnormalities of gait and mobility: Secondary | ICD-10-CM | POA: Diagnosis not present

## 2023-09-02 DIAGNOSIS — M6281 Muscle weakness (generalized): Secondary | ICD-10-CM | POA: Diagnosis not present

## 2023-09-02 DIAGNOSIS — I69398 Other sequelae of cerebral infarction: Secondary | ICD-10-CM | POA: Diagnosis not present

## 2023-09-02 DIAGNOSIS — G629 Polyneuropathy, unspecified: Secondary | ICD-10-CM | POA: Diagnosis not present

## 2023-09-02 DIAGNOSIS — R2689 Other abnormalities of gait and mobility: Secondary | ICD-10-CM | POA: Diagnosis not present

## 2023-09-06 DIAGNOSIS — M6281 Muscle weakness (generalized): Secondary | ICD-10-CM | POA: Diagnosis not present

## 2023-09-06 DIAGNOSIS — R2689 Other abnormalities of gait and mobility: Secondary | ICD-10-CM | POA: Diagnosis not present

## 2023-09-06 DIAGNOSIS — R269 Unspecified abnormalities of gait and mobility: Secondary | ICD-10-CM | POA: Diagnosis not present

## 2023-09-06 DIAGNOSIS — I69398 Other sequelae of cerebral infarction: Secondary | ICD-10-CM | POA: Diagnosis not present

## 2023-09-07 DIAGNOSIS — R269 Unspecified abnormalities of gait and mobility: Secondary | ICD-10-CM | POA: Diagnosis not present

## 2023-09-07 DIAGNOSIS — I69398 Other sequelae of cerebral infarction: Secondary | ICD-10-CM | POA: Diagnosis not present

## 2023-09-07 DIAGNOSIS — M6281 Muscle weakness (generalized): Secondary | ICD-10-CM | POA: Diagnosis not present

## 2023-09-07 DIAGNOSIS — R2689 Other abnormalities of gait and mobility: Secondary | ICD-10-CM | POA: Diagnosis not present

## 2023-09-08 DIAGNOSIS — M6281 Muscle weakness (generalized): Secondary | ICD-10-CM | POA: Diagnosis not present

## 2023-09-08 DIAGNOSIS — R269 Unspecified abnormalities of gait and mobility: Secondary | ICD-10-CM | POA: Diagnosis not present

## 2023-09-08 DIAGNOSIS — R2689 Other abnormalities of gait and mobility: Secondary | ICD-10-CM | POA: Diagnosis not present

## 2023-09-08 DIAGNOSIS — I69398 Other sequelae of cerebral infarction: Secondary | ICD-10-CM | POA: Diagnosis not present

## 2023-09-09 DIAGNOSIS — I69398 Other sequelae of cerebral infarction: Secondary | ICD-10-CM | POA: Diagnosis not present

## 2023-09-09 DIAGNOSIS — R269 Unspecified abnormalities of gait and mobility: Secondary | ICD-10-CM | POA: Diagnosis not present

## 2023-09-09 DIAGNOSIS — M6281 Muscle weakness (generalized): Secondary | ICD-10-CM | POA: Diagnosis not present

## 2023-09-09 DIAGNOSIS — R2689 Other abnormalities of gait and mobility: Secondary | ICD-10-CM | POA: Diagnosis not present

## 2023-09-10 DIAGNOSIS — R2689 Other abnormalities of gait and mobility: Secondary | ICD-10-CM | POA: Diagnosis not present

## 2023-09-10 DIAGNOSIS — I69398 Other sequelae of cerebral infarction: Secondary | ICD-10-CM | POA: Diagnosis not present

## 2023-09-10 DIAGNOSIS — R269 Unspecified abnormalities of gait and mobility: Secondary | ICD-10-CM | POA: Diagnosis not present

## 2023-09-10 DIAGNOSIS — M6281 Muscle weakness (generalized): Secondary | ICD-10-CM | POA: Diagnosis not present

## 2023-09-17 DIAGNOSIS — I69398 Other sequelae of cerebral infarction: Secondary | ICD-10-CM | POA: Diagnosis not present

## 2023-09-21 DIAGNOSIS — Z961 Presence of intraocular lens: Secondary | ICD-10-CM | POA: Diagnosis not present

## 2023-09-21 DIAGNOSIS — E7849 Other hyperlipidemia: Secondary | ICD-10-CM | POA: Diagnosis not present

## 2023-09-21 DIAGNOSIS — H35013 Changes in retinal vascular appearance, bilateral: Secondary | ICD-10-CM | POA: Diagnosis not present

## 2023-09-21 DIAGNOSIS — E1165 Type 2 diabetes mellitus with hyperglycemia: Secondary | ICD-10-CM | POA: Diagnosis not present

## 2023-09-23 DIAGNOSIS — Z79899 Other long term (current) drug therapy: Secondary | ICD-10-CM | POA: Diagnosis not present

## 2023-09-23 DIAGNOSIS — I10 Essential (primary) hypertension: Secondary | ICD-10-CM | POA: Diagnosis not present

## 2023-09-23 DIAGNOSIS — I2510 Atherosclerotic heart disease of native coronary artery without angina pectoris: Secondary | ICD-10-CM | POA: Diagnosis not present

## 2023-10-07 DIAGNOSIS — I2510 Atherosclerotic heart disease of native coronary artery without angina pectoris: Secondary | ICD-10-CM | POA: Diagnosis not present

## 2023-10-07 DIAGNOSIS — Z79899 Other long term (current) drug therapy: Secondary | ICD-10-CM | POA: Diagnosis not present

## 2023-10-07 DIAGNOSIS — G629 Polyneuropathy, unspecified: Secondary | ICD-10-CM | POA: Diagnosis not present

## 2023-10-19 DIAGNOSIS — Z79899 Other long term (current) drug therapy: Secondary | ICD-10-CM | POA: Diagnosis not present

## 2023-10-19 DIAGNOSIS — E039 Hypothyroidism, unspecified: Secondary | ICD-10-CM | POA: Diagnosis not present

## 2023-10-19 DIAGNOSIS — I2510 Atherosclerotic heart disease of native coronary artery without angina pectoris: Secondary | ICD-10-CM | POA: Diagnosis not present

## 2023-10-20 DIAGNOSIS — R4701 Aphasia: Secondary | ICD-10-CM | POA: Diagnosis not present

## 2023-10-20 DIAGNOSIS — M1991 Primary osteoarthritis, unspecified site: Secondary | ICD-10-CM | POA: Diagnosis not present

## 2023-10-20 DIAGNOSIS — I2510 Atherosclerotic heart disease of native coronary artery without angina pectoris: Secondary | ICD-10-CM | POA: Diagnosis not present

## 2023-10-20 DIAGNOSIS — E039 Hypothyroidism, unspecified: Secondary | ICD-10-CM | POA: Diagnosis not present

## 2023-10-20 DIAGNOSIS — H26493 Other secondary cataract, bilateral: Secondary | ICD-10-CM | POA: Diagnosis not present

## 2023-10-20 DIAGNOSIS — H53461 Homonymous bilateral field defects, right side: Secondary | ICD-10-CM | POA: Diagnosis not present

## 2023-10-20 DIAGNOSIS — E785 Hyperlipidemia, unspecified: Secondary | ICD-10-CM | POA: Diagnosis not present

## 2023-10-20 DIAGNOSIS — H538 Other visual disturbances: Secondary | ICD-10-CM | POA: Diagnosis not present

## 2023-10-20 DIAGNOSIS — I6322 Cerebral infarction due to unspecified occlusion or stenosis of basilar arteries: Secondary | ICD-10-CM | POA: Diagnosis not present

## 2023-10-20 DIAGNOSIS — Z961 Presence of intraocular lens: Secondary | ICD-10-CM | POA: Diagnosis not present

## 2023-10-20 DIAGNOSIS — I10 Essential (primary) hypertension: Secondary | ICD-10-CM | POA: Diagnosis not present

## 2023-10-20 DIAGNOSIS — G629 Polyneuropathy, unspecified: Secondary | ICD-10-CM | POA: Diagnosis not present

## 2023-11-04 DIAGNOSIS — I2510 Atherosclerotic heart disease of native coronary artery without angina pectoris: Secondary | ICD-10-CM | POA: Diagnosis not present

## 2023-11-04 DIAGNOSIS — Z79899 Other long term (current) drug therapy: Secondary | ICD-10-CM | POA: Diagnosis not present

## 2023-11-04 DIAGNOSIS — G629 Polyneuropathy, unspecified: Secondary | ICD-10-CM | POA: Diagnosis not present

## 2023-11-08 DIAGNOSIS — Z9181 History of falling: Secondary | ICD-10-CM | POA: Diagnosis not present

## 2023-11-08 DIAGNOSIS — R296 Repeated falls: Secondary | ICD-10-CM | POA: Diagnosis not present

## 2023-11-08 DIAGNOSIS — E039 Hypothyroidism, unspecified: Secondary | ICD-10-CM | POA: Diagnosis not present

## 2023-11-08 DIAGNOSIS — I10 Essential (primary) hypertension: Secondary | ICD-10-CM | POA: Diagnosis not present

## 2023-11-08 DIAGNOSIS — R269 Unspecified abnormalities of gait and mobility: Secondary | ICD-10-CM | POA: Diagnosis not present

## 2023-11-08 DIAGNOSIS — I69398 Other sequelae of cerebral infarction: Secondary | ICD-10-CM | POA: Diagnosis not present

## 2023-11-08 DIAGNOSIS — I2510 Atherosclerotic heart disease of native coronary artery without angina pectoris: Secondary | ICD-10-CM | POA: Diagnosis not present

## 2023-11-08 DIAGNOSIS — Z79899 Other long term (current) drug therapy: Secondary | ICD-10-CM | POA: Diagnosis not present

## 2023-11-08 DIAGNOSIS — R2689 Other abnormalities of gait and mobility: Secondary | ICD-10-CM | POA: Diagnosis not present

## 2023-11-08 DIAGNOSIS — M6281 Muscle weakness (generalized): Secondary | ICD-10-CM | POA: Diagnosis not present

## 2023-11-09 DIAGNOSIS — R296 Repeated falls: Secondary | ICD-10-CM | POA: Diagnosis not present

## 2023-11-09 DIAGNOSIS — Z9181 History of falling: Secondary | ICD-10-CM | POA: Diagnosis not present

## 2023-11-09 DIAGNOSIS — R2689 Other abnormalities of gait and mobility: Secondary | ICD-10-CM | POA: Diagnosis not present

## 2023-11-09 DIAGNOSIS — I69398 Other sequelae of cerebral infarction: Secondary | ICD-10-CM | POA: Diagnosis not present

## 2023-11-09 DIAGNOSIS — R269 Unspecified abnormalities of gait and mobility: Secondary | ICD-10-CM | POA: Diagnosis not present

## 2023-11-09 DIAGNOSIS — M6281 Muscle weakness (generalized): Secondary | ICD-10-CM | POA: Diagnosis not present

## 2023-11-10 DIAGNOSIS — R269 Unspecified abnormalities of gait and mobility: Secondary | ICD-10-CM | POA: Diagnosis not present

## 2023-11-10 DIAGNOSIS — Z9181 History of falling: Secondary | ICD-10-CM | POA: Diagnosis not present

## 2023-11-10 DIAGNOSIS — I69398 Other sequelae of cerebral infarction: Secondary | ICD-10-CM | POA: Diagnosis not present

## 2023-11-10 DIAGNOSIS — R296 Repeated falls: Secondary | ICD-10-CM | POA: Diagnosis not present

## 2023-11-10 DIAGNOSIS — R2689 Other abnormalities of gait and mobility: Secondary | ICD-10-CM | POA: Diagnosis not present

## 2023-11-10 DIAGNOSIS — M6281 Muscle weakness (generalized): Secondary | ICD-10-CM | POA: Diagnosis not present

## 2023-11-11 DIAGNOSIS — R296 Repeated falls: Secondary | ICD-10-CM | POA: Diagnosis not present

## 2023-11-11 DIAGNOSIS — I69398 Other sequelae of cerebral infarction: Secondary | ICD-10-CM | POA: Diagnosis not present

## 2023-11-11 DIAGNOSIS — Z9181 History of falling: Secondary | ICD-10-CM | POA: Diagnosis not present

## 2023-11-11 DIAGNOSIS — R2689 Other abnormalities of gait and mobility: Secondary | ICD-10-CM | POA: Diagnosis not present

## 2023-11-11 DIAGNOSIS — M6281 Muscle weakness (generalized): Secondary | ICD-10-CM | POA: Diagnosis not present

## 2023-11-11 DIAGNOSIS — R269 Unspecified abnormalities of gait and mobility: Secondary | ICD-10-CM | POA: Diagnosis not present

## 2023-11-11 DIAGNOSIS — I2510 Atherosclerotic heart disease of native coronary artery without angina pectoris: Secondary | ICD-10-CM | POA: Diagnosis not present

## 2023-11-12 DIAGNOSIS — M6281 Muscle weakness (generalized): Secondary | ICD-10-CM | POA: Diagnosis not present

## 2023-11-12 DIAGNOSIS — Z9181 History of falling: Secondary | ICD-10-CM | POA: Diagnosis not present

## 2023-11-12 DIAGNOSIS — I69398 Other sequelae of cerebral infarction: Secondary | ICD-10-CM | POA: Diagnosis not present

## 2023-11-12 DIAGNOSIS — R296 Repeated falls: Secondary | ICD-10-CM | POA: Diagnosis not present

## 2023-11-12 DIAGNOSIS — R2689 Other abnormalities of gait and mobility: Secondary | ICD-10-CM | POA: Diagnosis not present

## 2023-11-12 DIAGNOSIS — R269 Unspecified abnormalities of gait and mobility: Secondary | ICD-10-CM | POA: Diagnosis not present

## 2023-11-15 DIAGNOSIS — Z9181 History of falling: Secondary | ICD-10-CM | POA: Diagnosis not present

## 2023-11-15 DIAGNOSIS — R296 Repeated falls: Secondary | ICD-10-CM | POA: Diagnosis not present

## 2023-11-15 DIAGNOSIS — R269 Unspecified abnormalities of gait and mobility: Secondary | ICD-10-CM | POA: Diagnosis not present

## 2023-11-15 DIAGNOSIS — R2689 Other abnormalities of gait and mobility: Secondary | ICD-10-CM | POA: Diagnosis not present

## 2023-11-15 DIAGNOSIS — I69398 Other sequelae of cerebral infarction: Secondary | ICD-10-CM | POA: Diagnosis not present

## 2023-11-15 DIAGNOSIS — M6281 Muscle weakness (generalized): Secondary | ICD-10-CM | POA: Diagnosis not present

## 2023-11-16 DIAGNOSIS — R269 Unspecified abnormalities of gait and mobility: Secondary | ICD-10-CM | POA: Diagnosis not present

## 2023-11-16 DIAGNOSIS — M6281 Muscle weakness (generalized): Secondary | ICD-10-CM | POA: Diagnosis not present

## 2023-11-16 DIAGNOSIS — Z9181 History of falling: Secondary | ICD-10-CM | POA: Diagnosis not present

## 2023-11-16 DIAGNOSIS — R2689 Other abnormalities of gait and mobility: Secondary | ICD-10-CM | POA: Diagnosis not present

## 2023-11-16 DIAGNOSIS — I69398 Other sequelae of cerebral infarction: Secondary | ICD-10-CM | POA: Diagnosis not present

## 2023-11-16 DIAGNOSIS — R296 Repeated falls: Secondary | ICD-10-CM | POA: Diagnosis not present

## 2023-11-17 DIAGNOSIS — Z9181 History of falling: Secondary | ICD-10-CM | POA: Diagnosis not present

## 2023-11-17 DIAGNOSIS — R2689 Other abnormalities of gait and mobility: Secondary | ICD-10-CM | POA: Diagnosis not present

## 2023-11-17 DIAGNOSIS — I69398 Other sequelae of cerebral infarction: Secondary | ICD-10-CM | POA: Diagnosis not present

## 2023-11-17 DIAGNOSIS — M6281 Muscle weakness (generalized): Secondary | ICD-10-CM | POA: Diagnosis not present

## 2023-11-17 DIAGNOSIS — R296 Repeated falls: Secondary | ICD-10-CM | POA: Diagnosis not present

## 2023-11-17 DIAGNOSIS — R269 Unspecified abnormalities of gait and mobility: Secondary | ICD-10-CM | POA: Diagnosis not present

## 2023-11-18 DIAGNOSIS — E039 Hypothyroidism, unspecified: Secondary | ICD-10-CM | POA: Diagnosis not present

## 2023-11-18 DIAGNOSIS — R2689 Other abnormalities of gait and mobility: Secondary | ICD-10-CM | POA: Diagnosis not present

## 2023-11-18 DIAGNOSIS — I2510 Atherosclerotic heart disease of native coronary artery without angina pectoris: Secondary | ICD-10-CM | POA: Diagnosis not present

## 2023-11-18 DIAGNOSIS — H26493 Other secondary cataract, bilateral: Secondary | ICD-10-CM | POA: Diagnosis not present

## 2023-11-18 DIAGNOSIS — I69398 Other sequelae of cerebral infarction: Secondary | ICD-10-CM | POA: Diagnosis not present

## 2023-11-18 DIAGNOSIS — Z79899 Other long term (current) drug therapy: Secondary | ICD-10-CM | POA: Diagnosis not present

## 2023-11-18 DIAGNOSIS — R296 Repeated falls: Secondary | ICD-10-CM | POA: Diagnosis not present

## 2023-11-18 DIAGNOSIS — R269 Unspecified abnormalities of gait and mobility: Secondary | ICD-10-CM | POA: Diagnosis not present

## 2023-11-18 DIAGNOSIS — H538 Other visual disturbances: Secondary | ICD-10-CM | POA: Diagnosis not present

## 2023-11-18 DIAGNOSIS — M6281 Muscle weakness (generalized): Secondary | ICD-10-CM | POA: Diagnosis not present

## 2023-11-18 DIAGNOSIS — I6322 Cerebral infarction due to unspecified occlusion or stenosis of basilar arteries: Secondary | ICD-10-CM | POA: Diagnosis not present

## 2023-11-18 DIAGNOSIS — Z9181 History of falling: Secondary | ICD-10-CM | POA: Diagnosis not present

## 2023-11-18 DIAGNOSIS — H53461 Homonymous bilateral field defects, right side: Secondary | ICD-10-CM | POA: Diagnosis not present

## 2023-11-18 DIAGNOSIS — I10 Essential (primary) hypertension: Secondary | ICD-10-CM | POA: Diagnosis not present

## 2023-11-18 DIAGNOSIS — G629 Polyneuropathy, unspecified: Secondary | ICD-10-CM | POA: Diagnosis not present

## 2023-11-18 DIAGNOSIS — E785 Hyperlipidemia, unspecified: Secondary | ICD-10-CM | POA: Diagnosis not present

## 2023-11-18 DIAGNOSIS — R4701 Aphasia: Secondary | ICD-10-CM | POA: Diagnosis not present

## 2023-11-18 DIAGNOSIS — M1991 Primary osteoarthritis, unspecified site: Secondary | ICD-10-CM | POA: Diagnosis not present

## 2023-11-19 DIAGNOSIS — R296 Repeated falls: Secondary | ICD-10-CM | POA: Diagnosis not present

## 2023-11-19 DIAGNOSIS — R269 Unspecified abnormalities of gait and mobility: Secondary | ICD-10-CM | POA: Diagnosis not present

## 2023-11-19 DIAGNOSIS — M6281 Muscle weakness (generalized): Secondary | ICD-10-CM | POA: Diagnosis not present

## 2023-11-19 DIAGNOSIS — I69398 Other sequelae of cerebral infarction: Secondary | ICD-10-CM | POA: Diagnosis not present

## 2023-11-19 DIAGNOSIS — Z9181 History of falling: Secondary | ICD-10-CM | POA: Diagnosis not present

## 2023-11-19 DIAGNOSIS — R2689 Other abnormalities of gait and mobility: Secondary | ICD-10-CM | POA: Diagnosis not present

## 2023-11-24 ENCOUNTER — Encounter: Payer: Self-pay | Admitting: Family Medicine

## 2023-11-24 ENCOUNTER — Ambulatory Visit (INDEPENDENT_AMBULATORY_CARE_PROVIDER_SITE_OTHER): Admitting: Family Medicine

## 2023-11-24 VITALS — BP 142/72 | HR 71 | Temp 98.2°F | Ht 67.0 in | Wt 153.0 lb

## 2023-11-24 DIAGNOSIS — N319 Neuromuscular dysfunction of bladder, unspecified: Secondary | ICD-10-CM

## 2023-11-24 DIAGNOSIS — Z79899 Other long term (current) drug therapy: Secondary | ICD-10-CM

## 2023-11-24 DIAGNOSIS — F419 Anxiety disorder, unspecified: Secondary | ICD-10-CM

## 2023-11-24 DIAGNOSIS — I6930 Unspecified sequelae of cerebral infarction: Secondary | ICD-10-CM

## 2023-11-24 DIAGNOSIS — Z789 Other specified health status: Secondary | ICD-10-CM | POA: Diagnosis not present

## 2023-11-24 DIAGNOSIS — I10 Essential (primary) hypertension: Secondary | ICD-10-CM

## 2023-11-24 DIAGNOSIS — Z23 Encounter for immunization: Secondary | ICD-10-CM

## 2023-11-24 DIAGNOSIS — E039 Hypothyroidism, unspecified: Secondary | ICD-10-CM

## 2023-11-24 DIAGNOSIS — I2510 Atherosclerotic heart disease of native coronary artery without angina pectoris: Secondary | ICD-10-CM

## 2023-11-24 DIAGNOSIS — G629 Polyneuropathy, unspecified: Secondary | ICD-10-CM | POA: Diagnosis not present

## 2023-11-24 DIAGNOSIS — E782 Mixed hyperlipidemia: Secondary | ICD-10-CM

## 2023-11-24 LAB — LIPID PANEL

## 2023-11-24 MED ORDER — PREGABALIN 50 MG PO CAPS: 50.0000 mg | ORAL_CAPSULE | Freq: Three times a day (TID) | ORAL | 1 refills | Status: DC

## 2023-11-24 NOTE — Progress Notes (Signed)
 Established Patient Office Visit  Subjective   Patient ID: Grant Berry, male    DOB: 1945-04-04  Age: 78 y.o. MRN: [id number]  Chief Complaint  Patient presents with   Medical Management of Chronic Issues    HPI   History of Present Illness   Grant Berry is a 78 year old male who presents for follow-up after rehabilitation for a stroke.  Cerebrovascular accident recovery - Stroke occurred in December with stent placement performed at that time - Currently on Plavix  and aspirin  daily for secondary prevention - Completed rehabilitation at a skilled facility and has been home since November 20, 2023 - No chest pain or shortness of breath - Visual disturbance has been improving but still has difficulty with vision - Bladder dysfunction since stroke - Left sided weakness present from initial stroke years ago - Impaired mobility. In wheelchair currently  Dyslipidemia management - Currently taking Zetia , fenofibrate, and fish oil for cholesterol management - statin intolerance - No recent laboratory assessment of cholesterol levels on file  Lower urinary tract symptoms - Experiencing urinary frequency and urgency, requiring use of a urinal at night - Previously catheterized but no longer using a catheter - Currently taking Flomax  at bedtime  Peripheral neuropathy and medication side effects - Taking Lyrica three times daily for neuropathy with good control - Previously on gabapentin, switched to Lyrica due to dizziness  Visual disturbances - Vision changes described as 'hazy and dark, like in a real dark room' - Improvement in vision, particularly in the left eye  Psychological symptoms - No anxiety  Thyroid  medication uncertainty - Uncertain about current thyroid  medication status     Anxiety - Well controlled with zoloft and buspar     11/24/2023   11:05 AM 10/23/2022   10:51 AM 08/03/2022    1:25 PM  Depression screen PHQ 2/9  Decreased Interest 0  0 0  Down, Depressed, Hopeless 0 0 0  PHQ - 2 Score 0 0 0  Altered sleeping 0 0 0  Tired, decreased energy 0 0 0  Change in appetite 0 0 0  Feeling bad or failure about yourself  0 0 0  Trouble concentrating 0 0 0  Moving slowly or fidgety/restless 0 0 0  Suicidal thoughts 0 0 0  PHQ-9 Score 0 0 0  Difficult doing work/chores Not difficult at all Not difficult at all Not difficult at all      11/24/2023   11:05 AM 10/23/2022   10:50 AM 07/10/2022    9:57 AM 01/08/2022   10:39 AM  GAD 7 : Generalized Anxiety Score  Nervous, Anxious, on Edge 0 0 0 0  Control/stop worrying 0 0 0 0  Worry too much - different things 0 0 0 0  Trouble relaxing 0 0 0 0  Restless 0 0 0 0  Easily annoyed or irritable 0 0 0 0  Afraid - awful might happen 0 0 0 0  Total GAD 7 Score 0 0 0 0  Anxiety Difficulty Not difficult at all Not difficult at all Not difficult at all Not difficult at all       ROS As per HPI.    Objective:     BP (!) 146/80   Pulse 71   Temp 98.2 F (36.8 C) (Temporal)   Ht 5' 7 (1.702 m)   Wt 153 lb (69.4 kg)   SpO2 96%   BMI 23.96 kg/m    Physical Exam Vitals and nursing note  reviewed.  Constitutional:      General: He is not in acute distress.    Appearance: He is not ill-appearing, toxic-appearing or diaphoretic.  HENT:     Mouth/Throat:     Mouth: Mucous membranes are moist.     Pharynx: Oropharynx is clear.  Eyes:     Extraocular Movements: Extraocular movements intact.     Conjunctiva/sclera: Conjunctivae normal.     Pupils: Pupils are equal, round, and reactive to light.  Neck:     Thyroid : No thyroid  mass, thyromegaly or thyroid  tenderness.  Cardiovascular:     Rate and Rhythm: Normal rate and regular rhythm.     Heart sounds: Normal heart sounds. No murmur heard. Pulmonary:     Effort: Pulmonary effort is normal. No respiratory distress.     Breath sounds: Normal breath sounds.  Abdominal:     General: Bowel sounds are normal. There is no  distension.     Palpations: Abdomen is soft.     Tenderness: There is no abdominal tenderness.  Musculoskeletal:     Right lower leg: No edema.     Left lower leg: No edema.  Skin:    General: Skin is warm and dry.  Neurological:     Mental Status: He is alert and oriented to person, place, and time.     Motor: Weakness (residual weakness to left upper of lower extremity) present.     Coordination: Coordination normal.     Gait: Gait abnormal (arrives in wheelchair).  Psychiatric:        Mood and Affect: Mood normal.        Behavior: Behavior normal.      No results found for any visits on 11/24/23.    The ASCVD Risk score (Arnett DK, et al., 2019) failed to calculate for the following reasons:   Risk score cannot be calculated because patient has a medical history suggesting prior/existing ASCVD    Assessment & Plan:   Martie was seen today for medical management of chronic issues.  Diagnoses and all orders for this visit:  History of cerebrovascular accident (CVA) with residual deficit  Polyneuropathy -     pregabalin (LYRICA) 50 MG capsule; Take 1 capsule (50 mg total) by mouth 3 (three) times daily.  Neurogenic bladder  Statin intolerance  Mixed hyperlipidemia -     Lipid panel  Primary hypertension -     CBC with Differential/Platelet -     CMP14+EGFR  Atherosclerosis of native coronary artery of native heart without angina pectoris  Controlled substance agreement signed -     pregabalin (LYRICA) 50 MG capsule; Take 1 capsule (50 mg total) by mouth 3 (three) times daily. 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 Visual improvement noted, weakness persists but managed from initial stroke. - Continue Plavix  and aspirin .   Atherosclerosis of native artery of native heart - continue aspirin     Mixed hyperlipidemia On fenofibrate, Zetia , and fish oil.  - Order lipid profile. - Continue current medications.  Polyneuropathy managed with Lyrica Lyrica effective for neuropathy pain. Requires controlled substance agreement and monitoring. - Sign controlled substance agreement. - Will order blood test due to urinary difficulties - Refill Lyrica on October 27th with 1 additional refill since he was discharged with 30 day supply, follow-up in three months for  further refills.   Neuromuscular bladder dysfunction Managed with Flomax , experiencing urgency and frequency. - Continue Flomax . - Continue follow up with urology.     Hypothyroidism - Continue levothyroxine - Check TSH level  Anxiety - Continue zoloft and buspar.   HTN - minimally elevated. Monitor BP at home and notify for elevated readings.   Return in about 3 months (around 02/24/2024) for chronic follow up.   The patient indicates understanding of these issues and agrees with the plan.  Annabella CHRISTELLA Search, FNP

## 2023-11-29 ENCOUNTER — Ambulatory Visit: Payer: Self-pay | Admitting: Family Medicine

## 2023-11-30 LAB — CMP14+EGFR
ALT: 12 IU/L (ref 0–44)
AST: 20 IU/L (ref 0–40)
Albumin: 4.3 g/dL (ref 3.8–4.8)
Alkaline Phosphatase: 49 IU/L (ref 47–123)
BUN/Creatinine Ratio: 20 (ref 10–24)
BUN: 22 mg/dL (ref 8–27)
Bilirubin Total: 0.3 mg/dL (ref 0.0–1.2)
CO2: 24 mmol/L (ref 20–29)
Calcium: 9.6 mg/dL (ref 8.6–10.2)
Chloride: 103 mmol/L (ref 96–106)
Creatinine, Ser: 1.1 mg/dL (ref 0.76–1.27)
Globulin, Total: 2.9 g/dL (ref 1.5–4.5)
Glucose: 89 mg/dL (ref 70–99)
Potassium: 4.5 mmol/L (ref 3.5–5.2)
Sodium: 141 mmol/L (ref 134–144)
Total Protein: 7.2 g/dL (ref 6.0–8.5)
eGFR: 69 mL/min/1.73 (ref >59)

## 2023-11-30 LAB — DRUG SCREEN 10 W/CONF, SERUM
Amphetamines, IA: NEGATIVE ng/mL
Barbiturates, IA: NEGATIVE ug/mL
Benzodiazepines, IA: NEGATIVE ng/mL
Cocaine & Metabolite, IA: NEGATIVE ng/mL
Methadone, IA: NEGATIVE ng/mL
Opiates, IA: NEGATIVE ng/mL
Oxycodones, IA: NEGATIVE ng/mL
Phencyclidine, IA: NEGATIVE ng/mL
Propoxyphene, IA: NEGATIVE ng/mL
THC(Marijuana) Metabolite, IA: NEGATIVE ng/mL

## 2023-11-30 LAB — CBC WITH DIFFERENTIAL/PLATELET
Basophils Absolute: 0.1 x10E3/uL (ref 0.0–0.2)
Basos: 1 %
EOS (ABSOLUTE): 0.4 x10E3/uL (ref 0.0–0.4)
Eos: 7 %
Hematocrit: 42.2 % (ref 37.5–51.0)
Hemoglobin: 13.4 g/dL (ref 13.0–17.7)
Immature Grans (Abs): 0 x10E3/uL (ref 0.0–0.1)
Immature Granulocytes: 0 %
Lymphocytes Absolute: 1.5 x10E3/uL (ref 0.7–3.1)
Lymphs: 27 %
MCH: 29.5 pg (ref 26.6–33.0)
MCHC: 31.8 g/dL (ref 31.5–35.7)
MCV: 93 fL (ref 79–97)
Monocytes Absolute: 0.5 x10E3/uL (ref 0.1–0.9)
Monocytes: 8 %
Neutrophils Absolute: 3.2 x10E3/uL (ref 1.4–7.0)
Neutrophils: 57 %
Platelets: 225 x10E3/uL (ref 150–450)
RBC: 4.55 x10E6/uL (ref 4.14–5.80)
RDW: 14.4 % (ref 11.6–15.4)
WBC: 5.6 x10E3/uL (ref 3.4–10.8)

## 2023-11-30 LAB — LIPID PANEL
Chol/HDL Ratio: 3.9 ratio (ref 0.0–5.0)
Cholesterol, Total: 178 mg/dL (ref 100–199)
HDL: 46 mg/dL (ref >39)
LDL Chol Calc (NIH): 119 mg/dL — ABNORMAL HIGH (ref 0–99)
Triglycerides: 68 mg/dL (ref 0–149)
VLDL Cholesterol Cal: 13 mg/dL (ref 5–40)

## 2023-11-30 LAB — TSH: TSH: 2.73 u[IU]/mL (ref 0.450–4.500)

## 2023-12-09 ENCOUNTER — Telehealth: Payer: Self-pay

## 2023-12-09 NOTE — Telephone Encounter (Signed)
 Called and spoke with Desharia and gave her verbal approval for orders.   Copied from CRM [id number]. Topic: Clinical - Home Health Verbal Orders >> Dec 09, 2023  4:29 PM Graeme ORN wrote: Caller/Agency:  Desharia OT [facility] ([facility]) Callback Number: [phone] secure vm  Service Requested: Occupational Therapy Frequency: plan of care approval : 1 time per week for 4 weeks, following week a phone call, then 1 time per week for one week, following week a phone call, then one time per week 1 week.  Any new concerns about the patient? No

## 2023-12-31 ENCOUNTER — Ambulatory Visit

## 2024-01-14 ENCOUNTER — Telehealth: Payer: Self-pay | Admitting: Family Medicine

## 2024-01-14 NOTE — Telephone Encounter (Unsigned)
 Copied from CRM [id number]. Topic: Clinical - Home Health Verbal Orders >> Jan 13, 2024  4:39 PM Graeme ORN wrote: Caller/Agency: Shellia Joseph Callback Number: [phone] Service Requested: Occupational Therapy Frequency: N/A - No orders needed - reporting Missed Visit 11/17 Any new concerns about the patient? N/A

## 2024-01-14 NOTE — Telephone Encounter (Signed)
 FYI

## 2024-01-18 ENCOUNTER — Telehealth: Payer: Self-pay | Admitting: Family Medicine

## 2024-01-18 NOTE — Telephone Encounter (Signed)
 FYI  Copied from CRM [id number]. Topic: Clinical - Home Health Verbal Orders >> Jan 18, 2024  4:31 PM Delon DASEN wrote: Caller/Agency: Tammy with [facility] Callback Number: [phone] Service Requested: Physical Therapy Frequency: n/a Any new concerns about the patient? No- missed PT visit today due to no answer

## 2024-01-25 ENCOUNTER — Other Ambulatory Visit: Payer: Self-pay | Admitting: *Deleted

## 2024-01-25 DIAGNOSIS — Z79899 Other long term (current) drug therapy: Secondary | ICD-10-CM

## 2024-01-25 DIAGNOSIS — R339 Retention of urine, unspecified: Secondary | ICD-10-CM

## 2024-01-25 DIAGNOSIS — N319 Neuromuscular dysfunction of bladder, unspecified: Secondary | ICD-10-CM

## 2024-01-25 DIAGNOSIS — G629 Polyneuropathy, unspecified: Secondary | ICD-10-CM

## 2024-01-25 NOTE — Telephone Encounter (Signed)
 Pt came into office today needing all medications refilled and sent to [facility] Last OV 11/24/23 Next OV 02/25/24 Most medications are Historical. ASA EC & Clopidogrel  last RF by Jorene Last, NP & Tamsulosin  last RF by Lauraine Oz FNP Pregabalin  RF dated 12/20/23 #90 w/ 1 RF

## 2024-01-26 ENCOUNTER — Telehealth: Payer: Self-pay | Admitting: Family Medicine

## 2024-01-26 MED ORDER — FENOFIBRATE 48 MG PO TABS: 48.0000 mg | ORAL_TABLET | Freq: Every day | ORAL | 3 refills | Status: AC

## 2024-01-26 MED ORDER — AMLODIPINE BESYLATE 5 MG PO TABS: 5.0000 mg | ORAL_TABLET | Freq: Every day | ORAL | 3 refills | Status: AC

## 2024-01-26 MED ORDER — CLOPIDOGREL BISULFATE 75 MG PO TABS: 75.0000 mg | ORAL_TABLET | Freq: Every day | ORAL | 3 refills | Status: AC

## 2024-01-26 MED ORDER — TAMSULOSIN HCL 0.4 MG PO CAPS: 0.4000 mg | ORAL_CAPSULE | Freq: Every day | ORAL | 3 refills | Status: AC

## 2024-01-26 MED ORDER — EZETIMIBE 10 MG PO TABS: 10.0000 mg | ORAL_TABLET | Freq: Every day | ORAL | 3 refills | Status: AC

## 2024-01-26 MED ORDER — BUSPIRONE HCL 5 MG PO TABS: 5.0000 mg | ORAL_TABLET | Freq: Three times a day (TID) | ORAL | 3 refills | Status: AC

## 2024-01-26 MED ORDER — FENOFIBRATE 40 MG PO TABS: 1.0000 | ORAL_TABLET | Freq: Every day | ORAL | 3 refills | Status: DC

## 2024-01-26 MED ORDER — LEVOTHYROXINE SODIUM 50 MCG PO TABS: 50.0000 ug | ORAL_TABLET | Freq: Every day | ORAL | 3 refills | Status: AC

## 2024-01-26 MED ORDER — PREGABALIN 50 MG PO CAPS: 50.0000 mg | ORAL_CAPSULE | Freq: Three times a day (TID) | ORAL | 0 refills | Status: AC

## 2024-01-26 MED ORDER — SERTRALINE HCL 25 MG PO TABS: 25.0000 mg | ORAL_TABLET | Freq: Every day | ORAL | 0 refills | Status: AC

## 2024-01-26 MED ORDER — ONDANSETRON HCL 4 MG PO TABS: 4.0000 mg | ORAL_TABLET | Freq: Four times a day (QID) | ORAL | 0 refills | Status: AC | PRN

## 2024-01-26 MED ORDER — ASPIRIN 81 MG PO TBEC: 81.0000 mg | DELAYED_RELEASE_TABLET | Freq: Every day | ORAL | 3 refills | Status: AC

## 2024-01-26 NOTE — Addendum Note (Signed)
 Addended by: Zoua Caporaso G on: 01/26/2024 04:36 PM   Modules accepted: Orders

## 2024-01-26 NOTE — Telephone Encounter (Signed)
 Ok for 48 mg daily. 90 days with 3 refills.

## 2024-01-26 NOTE — Telephone Encounter (Signed)
 Pharmacy received prescription for Fenofibrate 40 MG TABS,insurance does not cover the 40mg  and also pt has been taking 48mg . Pharmacy requesting rx for 48mg 

## 2024-01-26 NOTE — Telephone Encounter (Signed)
Rx sent in per provider

## 2024-01-28 ENCOUNTER — Telehealth: Payer: Self-pay | Admitting: Family Medicine

## 2024-01-28 NOTE — Telephone Encounter (Signed)
 PCP aware

## 2024-01-28 NOTE — Telephone Encounter (Signed)
 Copied from CRM [id number]. Topic: General - Other >> Jan 28, 2024 11:30 AM Zebedee SAUNDERS wrote: Reason for CRM: Received call from [facility] OT per Zacharia ph: [phone] reporting pt missed OT on 01/27/2024 and phone call on 01/17/2024.

## 2024-02-01 ENCOUNTER — Telehealth: Payer: Self-pay | Admitting: Family Medicine

## 2024-02-01 NOTE — Telephone Encounter (Signed)
 Copied from CRM [id number]. Topic: General - Other >> Jan 31, 2024  4:30 PM Susanna ORN wrote: Reason for CRM: Zacharia, with [facility], called to inform provider that patient missed phone call on 11/24 & he missed two visits on 11/17 & 12/04.

## 2024-02-02 NOTE — Telephone Encounter (Signed)
 Noted

## 2024-02-25 ENCOUNTER — Ambulatory Visit: Payer: Self-pay | Admitting: Family Medicine

## 2024-02-25 ENCOUNTER — Encounter: Payer: Self-pay | Admitting: Family Medicine

## 2024-02-28 ENCOUNTER — Ambulatory Visit: Admitting: Urology
